# Patient Record
Sex: Male | Born: 1935 | Race: Black or African American | Hispanic: No | Marital: Married | Smoking: Never smoker
Health system: Southern US, Community
[De-identification: ages and names within clinical notes are randomized; demographics above are authoritative.]

## PROBLEM LIST (undated history)

## (undated) DIAGNOSIS — I272 Pulmonary hypertension, unspecified: Secondary | ICD-10-CM

## (undated) DIAGNOSIS — R972 Elevated prostate specific antigen [PSA]: Secondary | ICD-10-CM

## (undated) DIAGNOSIS — R739 Hyperglycemia, unspecified: Secondary | ICD-10-CM

## (undated) DIAGNOSIS — Z952 Presence of prosthetic heart valve: Secondary | ICD-10-CM

## (undated) DIAGNOSIS — E611 Iron deficiency: Secondary | ICD-10-CM

## (undated) DIAGNOSIS — D696 Thrombocytopenia, unspecified: Secondary | ICD-10-CM

## (undated) DIAGNOSIS — D649 Anemia, unspecified: Secondary | ICD-10-CM

## (undated) DIAGNOSIS — I739 Peripheral vascular disease, unspecified: Secondary | ICD-10-CM

## (undated) DIAGNOSIS — N529 Male erectile dysfunction, unspecified: Secondary | ICD-10-CM

## (undated) DIAGNOSIS — I10 Essential (primary) hypertension: Secondary | ICD-10-CM

## (undated) DIAGNOSIS — I350 Nonrheumatic aortic (valve) stenosis: Secondary | ICD-10-CM

## (undated) DIAGNOSIS — I071 Rheumatic tricuspid insufficiency: Secondary | ICD-10-CM

## (undated) DIAGNOSIS — E7800 Pure hypercholesterolemia, unspecified: Secondary | ICD-10-CM

## (undated) DIAGNOSIS — K3184 Gastroparesis: Secondary | ICD-10-CM

## (undated) DIAGNOSIS — G40909 Epilepsy, unspecified, not intractable, without status epilepticus: Secondary | ICD-10-CM

## (undated) DIAGNOSIS — E039 Hypothyroidism, unspecified: Secondary | ICD-10-CM

## (undated) DIAGNOSIS — I77810 Thoracic aortic ectasia: Secondary | ICD-10-CM

## (undated) DIAGNOSIS — M129 Arthropathy, unspecified: Secondary | ICD-10-CM

## (undated) DIAGNOSIS — E041 Nontoxic single thyroid nodule: Secondary | ICD-10-CM

## (undated) DIAGNOSIS — R351 Nocturia: Secondary | ICD-10-CM

## (undated) DIAGNOSIS — I779 Disorder of arteries and arterioles, unspecified: Secondary | ICD-10-CM

## (undated) DIAGNOSIS — E559 Vitamin D deficiency, unspecified: Secondary | ICD-10-CM

## (undated) DIAGNOSIS — K635 Polyp of colon: Secondary | ICD-10-CM

## (undated) DIAGNOSIS — N401 Enlarged prostate with lower urinary tract symptoms: Secondary | ICD-10-CM

## (undated) HISTORY — DX: Thoracic aortic ectasia: I77.810

## (undated) HISTORY — DX: Iron deficiency: E61.1

## (undated) HISTORY — DX: Gastroparesis: K31.84

## (undated) HISTORY — DX: Thrombocytopenia, unspecified: D69.6

## (undated) HISTORY — DX: Benign prostatic hyperplasia with lower urinary tract symptoms: N40.1

## (undated) HISTORY — DX: Epilepsy, unspecified, not intractable, without status epilepticus: G40.909

## (undated) HISTORY — DX: Nonrheumatic aortic (valve) stenosis: I35.0

## (undated) HISTORY — DX: Polyp of colon: K63.5

## (undated) HISTORY — DX: Hyperglycemia, unspecified: R73.9

## (undated) HISTORY — DX: Nocturia: R35.1

## (undated) HISTORY — DX: Elevated prostate specific antigen (PSA): R97.20

## (undated) HISTORY — DX: Hypothyroidism, unspecified: E03.9

## (undated) HISTORY — DX: Pulmonary hypertension, unspecified: I27.20

## (undated) HISTORY — DX: Peripheral vascular disease, unspecified: I73.9

## (undated) HISTORY — DX: Essential (primary) hypertension: I10

## (undated) HISTORY — DX: Arthropathy, unspecified: M12.9

## (undated) HISTORY — DX: Vitamin D deficiency, unspecified: E55.9

## (undated) HISTORY — PX: NO PAST SURGERIES: SHX2092

## (undated) HISTORY — DX: Disorder of arteries and arterioles, unspecified: I77.9

## (undated) HISTORY — DX: Pure hypercholesterolemia, unspecified: E78.00

## (undated) HISTORY — DX: Rheumatic tricuspid insufficiency: I07.1

## (undated) HISTORY — DX: Anemia, unspecified: D64.9

## (undated) HISTORY — DX: Male erectile dysfunction, unspecified: N52.9

## (undated) HISTORY — DX: Nontoxic single thyroid nodule: E04.1

---

## 2014-02-02 ENCOUNTER — Encounter (HOSPITAL_BASED_OUTPATIENT_CLINIC_OR_DEPARTMENT_OTHER): Payer: Self-pay

## 2014-02-02 ENCOUNTER — Emergency Department (HOSPITAL_BASED_OUTPATIENT_CLINIC_OR_DEPARTMENT_OTHER)
Admission: EM | Admit: 2014-02-02 | Discharge: 2014-02-02 | Disposition: A | Discharge status: Home / Self Care | Payer: Medicare HMO | Attending: Emergency Medicine | Admitting: Emergency Medicine

## 2014-02-02 ENCOUNTER — Emergency Department (HOSPITAL_BASED_OUTPATIENT_CLINIC_OR_DEPARTMENT_OTHER): Payer: Medicare HMO

## 2014-02-02 DIAGNOSIS — E039 Hypothyroidism, unspecified: Secondary | ICD-10-CM

## 2014-02-02 DIAGNOSIS — R52 Pain, unspecified: Secondary | ICD-10-CM

## 2014-02-02 DIAGNOSIS — Y9389 Activity, other specified: Secondary | ICD-10-CM | POA: Insufficient documentation

## 2014-02-02 DIAGNOSIS — Y990 Civilian activity done for income or pay: Secondary | ICD-10-CM | POA: Insufficient documentation

## 2014-02-02 DIAGNOSIS — W230XXA Caught, crushed, jammed, or pinched between moving objects, initial encounter: Secondary | ICD-10-CM | POA: Insufficient documentation

## 2014-02-02 DIAGNOSIS — S91111A Laceration without foreign body of right great toe without damage to nail, initial encounter: Secondary | ICD-10-CM | POA: Diagnosis not present

## 2014-02-02 DIAGNOSIS — I350 Nonrheumatic aortic (valve) stenosis: Secondary | ICD-10-CM

## 2014-02-02 DIAGNOSIS — Y9289 Other specified places as the place of occurrence of the external cause: Secondary | ICD-10-CM | POA: Insufficient documentation

## 2014-02-02 DIAGNOSIS — S92421A Displaced fracture of distal phalanx of right great toe, initial encounter for closed fracture: Secondary | ICD-10-CM | POA: Insufficient documentation

## 2014-02-02 DIAGNOSIS — I071 Rheumatic tricuspid insufficiency: Secondary | ICD-10-CM

## 2014-02-02 DIAGNOSIS — I10 Essential (primary) hypertension: Secondary | ICD-10-CM

## 2014-02-02 DIAGNOSIS — S97111A Crushing injury of right great toe, initial encounter: Secondary | ICD-10-CM | POA: Diagnosis present

## 2014-02-02 DIAGNOSIS — I272 Pulmonary hypertension, unspecified: Secondary | ICD-10-CM

## 2014-02-02 DIAGNOSIS — S92401A Displaced unspecified fracture of right great toe, initial encounter for closed fracture: Secondary | ICD-10-CM

## 2014-02-02 DIAGNOSIS — E785 Hyperlipidemia, unspecified: Secondary | ICD-10-CM

## 2014-02-02 MED ORDER — CEPHALEXIN 250 MG PO CAPS
500.0000 mg | ORAL_CAPSULE | Freq: Once | ORAL | Status: AC
  Administered 2014-02-02: 500 mg via ORAL
  Filled 2014-02-02: qty 2

## 2014-02-02 MED ORDER — HYDROCODONE-ACETAMINOPHEN 5-325 MG PO TABS: 1.0000 | ORAL_TABLET | ORAL | Status: DC | PRN

## 2014-02-02 MED ORDER — CEPHALEXIN 500 MG PO CAPS: 500.0000 mg | ORAL_CAPSULE | Freq: Four times a day (QID) | ORAL | Status: DC

## 2014-02-02 MED ORDER — LIDOCAINE HCL 2 % IJ SOLN
INTRAMUSCULAR | Status: AC
  Administered 2014-02-02: 400 mg via INTRADERMAL
  Filled 2014-02-02: qty 20

## 2014-02-02 MED ORDER — LIDOCAINE HCL 2 % IJ SOLN
20.0000 mL | Freq: Once | INTRAMUSCULAR | Status: AC
  Administered 2014-02-02: 400 mg via INTRADERMAL

## 2014-02-02 NOTE — ED Notes (Signed)
Pts right great toe was crushed by machine. Pt has bleeding at this time. Toe nail not intact

## 2014-02-03 NOTE — ED Provider Notes (Signed)
CSN: [id number]     Arrival date & time 02/02/14  1651 History   First MD Initiated Contact with Patient 02/02/14 1706     Chief Complaint  Patient presents with  . Toe Injury      HPI Patient reports injury to his right great toe.  He was working with Programmer, multimediapiece machinery and a piece of machinery came down onto his right great toe nearly avulsing his right great toenail and leading to large complex laceration to his distal right great toe.  Some bleeding at this time.  Pain is mild to moderate in severity.Of anticoagulants the patient.  No other significant complaints.  Injury was focused to his right great toe.   History reviewed. No pertinent past medical history. History reviewed. No pertinent past surgical history. No family history on file. History  Substance Use Topics  . Smoking status: Never Smoker   . Smokeless tobacco: Not on file  . Alcohol Use: Not on file    Review of Systems  All other systems reviewed and are negative.     Allergies  Review of patient's allergies indicates no known allergies.  Home Medications   Prior to Admission medications   Medication Sig Start Date End Date Taking? Authorizing Provider  cephALEXin (KEFLEX) 500 MG capsule Take 1 capsule (500 mg total) by mouth 4 (four) times daily. 02/02/14   Lyanne CoKevin M Joh Rao, MD  HYDROcodone-acetaminophen (NORCO/VICODIN) 5-325 MG per tablet Take 1 tablet by mouth every 4 (four) hours as needed for moderate pain. 02/02/14   Lyanne CoKevin M Delcenia Inman, MD   BP 140/88 mmHg  Pulse 70  Temp(Src) 98.2 F (36.8 C) (Oral)  Resp 16  Ht 5\' 9"  (1.753 m)  Wt 175 lb (79.379 kg)  BMI 25.83 kg/m2  SpO2 99% Physical Exam  Constitutional: He is oriented to person, place, and time. He appears well-developed and well-nourished.  HENT:  Head: Normocephalic.  Eyes: EOM are normal.  Neck: Normal range of motion.  Pulmonary/Chest: Effort normal.  Abdominal: He exhibits no distension.  Musculoskeletal: Normal range of motion.  Near  complete avulsion and loss of the right great toenail which is only held on by a small amount of skin at the distal aspect.  Small amount of bleeding coming from the nail bed itself.  Approximately 1/5 of the nailbed on the medial aspect has been avulsed and there is some tissue loss at this area.  I can palpate the distal aspect of the phalanx itself.  I'm unable to visualize it.  Bleeding is controlled with pressure over the nail bed.  Small lacerations on both the medial and lateral aspect of the right great toe where the toe nail and very distal end of the toe was nearly avulsed  Neurological: He is alert and oriented to person, place, and time.  Psychiatric: He has a normal mood and affect.  Nursing note and vitals reviewed.   ED Course  Procedures (including critical care time) NERVE BLOCK Performed by: Lyanne CoAMPOS,Lianni Kanaan M Consent: Verbal consent obtained. Required items: required blood products, implants, devices, and special equipment available Time out: Immediately prior to procedure a "time out" was called to verify the correct patient, procedure, equipment, support staff and site/side marked as required. Indication: Laceration repair  Nerve block body site: Digital nerves, right great toe  Preparation: Patient was prepped and draped in the usual sterile fashion. Needle gauge: 24 G Location technique: anatomical landmarks Local anesthetic: Lidocaine 1% without epi  Anesthetic total: 6 ml Outcome: pain improved  Patient tolerance: Patient tolerated the procedure well with no immediate complications.   LACERATION REPAIR Performed by: Lyanne CoAMPOS,Fredrick Dray M Consent: Verbal consent obtained. Risks and benefits: risks, benefits and alternatives were discussed Patient identity confirmed: provided demographic data Time out performed prior to procedure Prepped and Draped in normal sterile fashion Wound explored Laceration Location: right great toe Laceration Length: 2cm No Foreign Bodies seen  or palpated Anesthesia:nerve block Irrigation method: syringe Amount of cleaning: copious Skin closure: 4-0 prolene Number of sutures or staples: 2 sutures on both the medial and lateral side  Technique: simple interrupted Patient tolerance: Patient tolerated the procedure well with no immediate complications.     Labs Review Labs Reviewed - No data to display  Imaging Review Dg Toe Great Right  02/02/2014   CLINICAL DATA:  smashed in a machine today. C/o distal pain, toenail is removed.Foot wrapped in gauze to control bleeding. Patient states toe is numb  EXAM: RIGHT GREAT TOE  COMPARISON:  None.  FINDINGS: Comminuted fracture of the distal phalanx right great toe predominantly involving the tuft region. Fracture line in the sagittal plane extends to the subchondral cortex without significant step-off deformity. There is surrounding soft tissue swelling. No radiodense foreign body. No other bone abnormality is identified. Normal mineralization and alignment.  IMPRESSION: 1. Comminuted fracture, distal phalanx right great toe with intra-articular extension.   Electronically Signed   By: Oley Balmaniel  Hassell M.D.   On: 02/02/2014 17:38  I personally reviewed the imaging tests through PACS system I reviewed available ER/hospitalization records through the EMR    EKG Interpretation None      MDM   Final diagnoses:  Fractured great toe, right, closed, initial encounter  Laceration of great toe, right, complicated, initial encounter    Nail of the right great toe was nearly completely avulsed.  This was removed in its entirety as it was a very abnormally shaped toenail to begin with.  The toenail could not be inserted back under the nail fold safely.  Small lacerations repaired on the side of the great toe however there is some tissue loss and a small amount of exposed bone.  The majority of this will have to heal by secondary intention and granulation.  The patient is at some risk for  infection in that distal phalanx.  This was washed out extensively at the bedside.  He was placed on antibiotics.  Orthopedic follow-up.  Small amount of bleeding continued from the nail bed which was easily controlled with direct pressure.  Pressure dressing applied.  Postop shoe.    Lyanne CoKevin M Maysen Sudol, MD 02/03/14 Moses Manners0025

## 2016-09-24 ENCOUNTER — Encounter: Payer: Medicare HMO | Admitting: Thoracic Surgery (Cardiothoracic Vascular Surgery)

## 2016-09-27 ENCOUNTER — Encounter: Payer: Medicare HMO | Admitting: Thoracic Surgery (Cardiothoracic Vascular Surgery)

## 2016-09-28 ENCOUNTER — Encounter: Payer: Medicare HMO | Admitting: Thoracic Surgery (Cardiothoracic Vascular Surgery)

## 2016-09-28 ENCOUNTER — Other Ambulatory Visit: Payer: Self-pay

## 2016-09-28 DIAGNOSIS — I350 Nonrheumatic aortic (valve) stenosis: Secondary | ICD-10-CM | POA: Insufficient documentation

## 2016-10-08 ENCOUNTER — Encounter: Payer: Self-pay | Admitting: Thoracic Surgery (Cardiothoracic Vascular Surgery)

## 2016-10-08 ENCOUNTER — Institutional Professional Consult (permissible substitution) (INDEPENDENT_AMBULATORY_CARE_PROVIDER_SITE_OTHER): Payer: Medicare HMO | Admitting: Thoracic Surgery (Cardiothoracic Vascular Surgery)

## 2016-10-08 ENCOUNTER — Encounter (HOSPITAL_BASED_OUTPATIENT_CLINIC_OR_DEPARTMENT_OTHER): Payer: Self-pay | Admitting: Physician Assistant

## 2016-10-08 VITALS — BP 140/84 | HR 72 | Resp 20 | Ht <= 58 in | Wt 178.0 lb

## 2016-10-08 DIAGNOSIS — I739 Peripheral vascular disease, unspecified: Secondary | ICD-10-CM | POA: Diagnosis not present

## 2016-10-08 DIAGNOSIS — I10 Essential (primary) hypertension: Secondary | ICD-10-CM

## 2016-10-08 DIAGNOSIS — I272 Pulmonary hypertension, unspecified: Secondary | ICD-10-CM

## 2016-10-08 DIAGNOSIS — I071 Rheumatic tricuspid insufficiency: Secondary | ICD-10-CM

## 2016-10-08 DIAGNOSIS — I350 Nonrheumatic aortic (valve) stenosis: Secondary | ICD-10-CM | POA: Diagnosis not present

## 2016-10-08 DIAGNOSIS — I779 Disorder of arteries and arterioles, unspecified: Secondary | ICD-10-CM | POA: Diagnosis not present

## 2016-10-08 DIAGNOSIS — E785 Hyperlipidemia, unspecified: Secondary | ICD-10-CM

## 2016-10-08 DIAGNOSIS — E039 Hypothyroidism, unspecified: Secondary | ICD-10-CM

## 2016-10-08 NOTE — Patient Instructions (Signed)
Continue all previous medications without any changes at this time  

## 2016-10-08 NOTE — Progress Notes (Signed)
[facility]  MULTIDISCIPLINARY HEART VALVE CLINIC  CARDIOTHORACIC SURGERY CONSULTATION REPORT  Referring Provider is [name], Suszanne ConnersMichael R, PA-C  PCP is [name], [name], PA-C  Chief Complaint  Patient presents with  . Aortic Stenosis    Surgical eval, ECHO 09/03/16 @ [facility]     HPI:  Patient is an 83 year old African-American male with history of aortic stenosis, hypertension, hyperlipidemia, peripheral vascular disease, and hypothyroidism with been referred for surgical consultation to discuss management options for treatment of severe aortic stenosis. The patient states that he was first noted have a heart murmur on physical exam many years ago he has been followed regularly ever since at [facility] in [place], for the last several years by [name].  He has remained physically active despite his advanced age, but serial echocardiograms have documented significant progression of disease. Most recent follow-up echocardiogram performed 09/03/2016 revealed velocity across the aortic valve measured as high as 4.5 m/s corresponding to mean transvalvular gradient estimated 54.9 mmHg and aortic valve area calculated 0.6 cm. Left ventricular systolic function remained normal with ejection fraction estimated 60-65%. The patient was referred for surgical consultation.  The patient is married and lives locally in [place] with his wife.  He is a bit of a difficult historian and wonders off topic frequently. However, he seems to have no significant memory problems. He is here alone for this consultation today. 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 He does admit that over the last 2 or 3 years he has noticed a tendency to get fatigued more easily than he had in the past. However, he specifically denies any symptoms of exertional shortness of breath or chest discomfort. He has not had any tachycardia  palpitations or dizzy spells. He denies any history of PND, orthopnea, or lower extremity edema. Appetite is good and his weight is stable.  Past Medical History:  Diagnosis Date  . Anemia   . Aortic valve stenosis   . Arthropathy   . BPH associated with nocturia   . Carotid artery disease (HCC)   . Colon polyp   . Dilated aortic root (HCC)   . Elevated PSA   . Erectile dysfunction   . Gastric paresis   . Hypercholesterolemia   . Hyperglycemia   . Hypertension   . Hypothyroid   . Iron deficiency   . PAD (peripheral artery disease) (HCC)   . Pulmonary hypertension (HCC)   . Seizure disorder (HCC)   . Thoracic aortic aneurysm (HCC)   . Thrombocytopenia (HCC)   . Thyroid nodule   . Tricuspid regurgitation   . Vitamin D deficiency     Past Surgical History:  Procedure Laterality Date  . NO PAST SURGERIES      No family history on file.  Social History   Social History  . Marital status: Married    Spouse name: N/A  . Number of children: N/A  . Years of education: N/A   Occupational History  . Not on file.   Social History Main Topics  . Smoking status: Never Smoker  . Smokeless tobacco: Never Used  . Alcohol use No  . Drug use: No  . Sexual activity: Not Currently   Other Topics Concern  . Not on file   Social History Narrative  . No narrative on file    Current Outpatient Prescriptions  Medication Sig Dispense Refill  . aspirin EC 81 MG  tablet 81 mg.    . levothyroxine (SYNTHROID, LEVOTHROID) 50 MCG tablet     . simvastatin (ZOCOR) 40 MG tablet 40 mg.    . tamsulosin (FLOMAX) 0.4 MG CAPS capsule Take 0.4 mg by mouth daily.     No current facility-administered medications for this visit.     No Known Allergies    Review of Systems:   General:  normal appetite, decreased energy, no weight gain, no weight loss, no fever  Cardiac:  no chest pain with exertion, no chest pain at rest, no SOB with exertion, no resting SOB, no PND, no orthopnea, no  palpitations, no arrhythmia, no atrial fibrillation, no LE edema, no dizzy spells, no syncope  Respiratory:  no shortness of breath, no home oxygen, no productive cough, no dry cough, no bronchitis, no wheezing, no hemoptysis, no asthma, no pain with inspiration or cough, no sleep apnea, no CPAP at night  GI:   no difficulty swallowing, no reflux, no frequent heartburn, no hiatal hernia, no abdominal pain, no constipation, no diarrhea, no hematochezia, no hematemesis, no melena  GU:   no dysuria,  no frequency, no urinary tract infection, no hematuria, no enlarged prostate, no kidney stones, no kidney disease  Vascular:  no pain suggestive of claudication, no pain in feet, no leg cramps, no varicose veins, no DVT, no non-healing foot ulcer  Neuro:   no stroke, no TIA's, no seizures, no headaches, no temporary blindness one eye,  no slurred speech, no peripheral neuropathy, no chronic pain, no instability of gait, no memory/cognitive dysfunction  Musculoskeletal: no arthritis, no joint swelling, no myalgias, no difficulty walking, normal mobility   Skin:   no rash, no itching, no skin infections, no pressure sores or ulcerations  Psych:   no anxiety, no depression, no nervousness, no unusual recent stress  Eyes:   no blurry vision, no floaters, no recent vision changes, + wears glasses or contacts  ENT:   no hearing loss, + loose or painful teeth, + partial dentures, last saw dentist many years ago  Hematologic:  no easy bruising, no abnormal bleeding, no clotting disorder, no frequent epistaxis  Endocrine:  no diabetes, does not check CBG's at home           Physical Exam:   BP 140/84   Pulse 72   Resp 20   Ht (!) 6.4" (0.163 m)   Wt 178 lb (80.7 kg)   SpO2 92% Comment: RA  BMI 3055.36 kg/m   General:  Elderly male NAD, well-appearing  HEENT:  Unremarkable but poor dentition  Neck:   no JVD, no bruits, no adenopathy   Chest:   clear to auscultation, symmetrical breath sounds, no  wheezes, no rhonchi   CV:   RRR, grade IV/VI crescendo/decrescendo murmur heard best at RUSB,  no diastolic murmur  Abdomen:  soft, non-tender, no masses   Extremities:  warm, well-perfused, pulses palpable, no LE edema  Rectal/GU  Deferred  Neuro:   Grossly non-focal and symmetrical throughout  Skin:   Clean and dry, no rashes, no breakdown   Diagnostic Tests:  TRANSTHORACIC ECHOCARDIOGRAM  Both images and report from transthoracic echocardiogram performed 09/03/2016 at [facility] are reviewed. The patient has severe aortic stenosis with normal left ventricular systolic function. Aortic valve may be functionally bicuspid with a single raphe.  There is severe thickening, calcification, and restricted leaflet mobility involving all 3 leaflets although one of the leaflets move better than the other 2. Peak velocity across  the aortic valve measured greater than 4.5 m/s corresponding to mean transvalvular gradient estimated 55 mmHg. Left ventricular systolic function appears normal. There is mild concentric left ventricular hypertrophy. The aortic root and overall size of the aortic valve is relatively large. The ascending thoracic aorta measured at least 4.6 cm. No other significant abnormalities are noted.   Impression:  The patient has at least stage C1 and possibly early stage D severe aortic stenosis. He also has at least moderate fusiform aneurysmal enlargement of the ascending thoracic aorta, and his aortic valve may be functioning bicuspid. The patient claims to be essentially asymptomatic and he reportedly is fairly active physically. He does admit to some degree of exertional fatigue which may be consistent with early chronic diastolic congestive heart failure.  He appears to have poor dentition and has not seen a dentist in years.   Plan:  The patient was counseled at length regarding treatment alternatives for management of severe aortic stenosis. The natural history of  disease in the importance of a the presence or absence of symptoms have been discussed. Alternative approaches such as conventional aortic valve replacement, transcatheter aortic valve replacement, and palliative medical therapy were compared and contrasted at length.  We discussed a variety of options at this juncture including continued medical therapy with very close follow-up versus exercise stress testing as a means to stratify whether not the patient is truly asymptomatic and/or at risk for significant disease progression in the near future. We plan to proceed with formal cardiopulmonary exercise testing was stress echocardiography as a next step. If the patient does well then we can potentially follow him closely on medical therapy with confidence. The patient will undergo CT angiography to more precisely measure the size of the aortic root and ascending thoracic aorta. Finally, the patient will be referred for dental service consultation. The patient will return to our office in approximately 4 weeks to review the results of his stress test and CT angiograms.  I have asked that he bring his wife with him for his next office visit.  All of his questions have been addressed.   I spent in excess of 90 minutes during the conduct of this office consultation and >50% of this time involved direct face-to-face encounter with the patient for counseling and/or coordination of their care.     Salvatore Decent. Cornelius Moras, MD 10/08/2016 5:51 PM

## 2016-10-09 ENCOUNTER — Other Ambulatory Visit: Payer: Self-pay | Admitting: *Deleted

## 2016-10-09 DIAGNOSIS — I350 Nonrheumatic aortic (valve) stenosis: Secondary | ICD-10-CM

## 2016-10-10 ENCOUNTER — Other Ambulatory Visit: Payer: Self-pay | Admitting: *Deleted

## 2016-10-10 DIAGNOSIS — I350 Nonrheumatic aortic (valve) stenosis: Secondary | ICD-10-CM

## 2016-10-12 ENCOUNTER — Encounter (HOSPITAL_COMMUNITY): Payer: Self-pay | Admitting: Dentistry

## 2016-10-12 ENCOUNTER — Ambulatory Visit (HOSPITAL_COMMUNITY): Payer: Self-pay | Admitting: Dentistry

## 2016-10-12 VITALS — BP 123/75 | HR 60 | Temp 97.7°F

## 2016-10-12 DIAGNOSIS — K0889 Other specified disorders of teeth and supporting structures: Secondary | ICD-10-CM

## 2016-10-12 DIAGNOSIS — K08409 Partial loss of teeth, unspecified cause, unspecified class: Secondary | ICD-10-CM

## 2016-10-12 DIAGNOSIS — Z972 Presence of dental prosthetic device (complete) (partial): Secondary | ICD-10-CM

## 2016-10-12 DIAGNOSIS — I350 Nonrheumatic aortic (valve) stenosis: Secondary | ICD-10-CM

## 2016-10-12 DIAGNOSIS — Z01818 Encounter for other preprocedural examination: Secondary | ICD-10-CM

## 2016-10-12 DIAGNOSIS — K045 Chronic apical periodontitis: Secondary | ICD-10-CM | POA: Diagnosis not present

## 2016-10-12 DIAGNOSIS — K06020 Generalized gingival recession, unspecified: Secondary | ICD-10-CM

## 2016-10-12 DIAGNOSIS — K06010 Localized gingival recession, unspecified: Secondary | ICD-10-CM

## 2016-10-12 DIAGNOSIS — K0530 Chronic periodontitis, unspecified: Secondary | ICD-10-CM | POA: Diagnosis not present

## 2016-10-12 DIAGNOSIS — K036 Deposits [accretions] on teeth: Secondary | ICD-10-CM

## 2016-10-12 DIAGNOSIS — M264 Malocclusion, unspecified: Secondary | ICD-10-CM

## 2016-10-12 DIAGNOSIS — K083 Retained dental root: Secondary | ICD-10-CM | POA: Insufficient documentation

## 2016-10-12 DIAGNOSIS — K029 Dental caries, unspecified: Secondary | ICD-10-CM

## 2016-10-12 NOTE — Patient Instructions (Signed)
[facility]    Department of Dental Medicine     DR. Yen Wandell      HEART VALVES AND MOUTH CARE:  FACTS:   If you have any infection in your mouth, it can infect your heart valve.  If you heart valve is infected, you will be seriously ill.  Infections in the mouth can be SILENT and do not always cause pain.  Examples of infections in the mouth are gum disease, dental cavities, and abscesses.  Some possible signs of infection are: Bad breath, bleeding gums, or teeth that are sensitive to sweets, hot, and/or cold. There are many other signs as well.  WHAT YOU HAVE TO DO:   Brush your teeth after meals and at bedtime. Spend at least 2 minutes brushing well, especially behind your back teeth and all around your teeth that stand alone. Brush at the gumline also.  Do not go to bed without brushing your teeth and flossing.  If you gums bleed when you brush or floss, do NOT stop brushing or flossing. It usually means that your gums need more attention and better cleaning.   If your Dentist or Dr. Tatiyanna Lashley gave you a prescription mouthwash to use, make sure to use it as directed. If you run out of the medication, get a refill at the pharmacy.   If you were given any other medications or directions by your Dentist, please follow them. If you did not understand the directions or forget what you were told, please call. We will be happy to refresh her memory.  If you need antibiotics before dental procedures, make sure you take them one hour prior to every dental visit as directed.   Get a dental checkup every 4-6 months in order to keep your mouth healthy, or to find and treat any new infection. You will most likely need your teeth cleaned or gums treated at the same time.  If you are not able to come in for your scheduled appointment, call your Dentist as soon as possible to reschedule.  If you have a problem in between dental visits, call your Dentist.  

## 2016-10-12 NOTE — Progress Notes (Signed)
DENTAL CONSULTATION  Date of Consultation:  10/12/2016 Patient Name:   Charles Patrick Date of Birth:   08-08-1924 Medical Record Number: [id number]  VITALS: BP 123/75 (BP Location: Left Arm)   Pulse 60   Temp 97.7 F (36.5 C) (Oral)    CHIEF COMPLAINT: Patient referred by Dr. Cornelius Moras for dental consultation.  HPI: Charles Patrick is an 83 year old male recently diagnosed with severe aortic stenosis. Patient with anticipated aortic valve replacement. Patient now seen as part of a medically necessary pre-heart valve surgery dental protocol examination to rule out dental infection that may affect the patient's systemic health and anticipated heart valve surgery.  The patient currently denies acute toothaches, swellings, or abscesses. Patient was last seen by dentist 10-15 years ago in [place], [place]. Patient had several teeth pulled at that time with subsequent fabrication of a maxillary partial denture.  Patient denies having a lower partial denture. Patient indicates that he only goes to the dentist when he needs to. Patient denies having dental phobia.   PROBLEM LIST: Patient Active Problem List   Diagnosis Date Noted  . Aortic valve stenosis     Priority: High  . HTN (hypertension) 10/08/2016  . HLD (hyperlipidemia) 10/08/2016  . Hypothyroidism 10/08/2016  . Pulmonary hypertension (HCC) 10/08/2016  . Tricuspid regurgitation 10/08/2016  . PAD (peripheral artery disease) (HCC)   . Carotid artery disease (HCC)     PMH: Past Medical History:  Diagnosis Date  . Anemia   . Aortic valve stenosis   . Arthropathy   . BPH associated with nocturia   . Carotid artery disease (HCC)   . Colon polyp   . Dilated aortic root (HCC)   . Elevated PSA   . Erectile dysfunction   . Gastric paresis   . Hypercholesterolemia   . Hyperglycemia   . Hypertension   . Hypothyroid   . Iron deficiency   . PAD (peripheral artery disease) (HCC)   . Pulmonary hypertension (HCC)   . Seizure  disorder (HCC)   . Thoracic aortic aneurysm (HCC)   . Thrombocytopenia (HCC)   . Thyroid nodule   . Tricuspid regurgitation   . Vitamin D deficiency     PSH: Past Surgical History:  Procedure Laterality Date  . NO PAST SURGERIES      ALLERGIES: No Known Allergies  MEDICATIONS: Current Outpatient Prescriptions  Medication Sig Dispense Refill  . aspirin EC 81 MG tablet 81 mg.    . levothyroxine (SYNTHROID, LEVOTHROID) 50 MCG tablet     . simvastatin (ZOCOR) 40 MG tablet 40 mg.    . tamsulosin (FLOMAX) 0.4 MG CAPS capsule Take 0.4 mg by mouth daily.     No current facility-administered medications for this visit.     LABS: No results found for: WBC, HGB, HCT, MCV, PLT No results found for: NA, K, CL, CO2, GLUCOSE, BUN, CREATININE, CALCIUM, GFRNONAA, GFRAA No results found for: INR, PROTIME No results found for: PTT   CBC from [facility] on 07/26/16 WBC: 6.8 Hgb: 11.3 HCT: 38.1 Platelets: 226  SOCIAL HISTORY: Social History   Social History  . Marital status: Married    Spouse name: N/A  . Number of children: N/A  . Years of education: N/A   Occupational History  . Not on file.   Social History Main Topics  . Smoking status: Never Smoker  . Smokeless tobacco: Never Used  . Alcohol use No  . Drug use: No  . Sexual activity: Not Currently   Other  Topics Concern  . Not on file   Social History Narrative  . No narrative on file    FAMILY HISTORY: History reviewed. No pertinent family history.  REVIEW OF SYSTEMS: Reviewed with the patient as per History of present illness. Psych: Patient denies having dental phobia.  DENTAL HISTORY: CHIEF COMPLAINT: Patient referred by Dr. Cornelius Moras for dental consultation.  HPI: Charles Patrick is an 83 year old male recently diagnosed with severe aortic stenosis. Patient with anticipated aortic valve replacement. Patient now seen as part of a medically necessary pre-heart valve surgery dental protocol examination to rule  out dental infection that may affect the patient's systemic health and anticipated heart valve surgery.  The patient currently denies acute toothaches, swellings, or abscesses. Patient was last seen by dentist 10-15 years ago and [place], [place]. Patient had several teeth pulled at that time with subsequent fabrication of a maxillary partial denture.  Patient denies having a lower partial denture. Patient indicates that he only goes to the dentist when he needs to. Patient denies having dental phobia.    DENTAL EXAMINATION: GENERAL: The patient is a tall, well-developed male in no acute distress. HEAD AND NECK: There is no palpable neck lymphadenopathy. The patient denies acute TMJ symptoms. INTRAORAL EXAM: The patient has normal saliva. There is no evidence of oral abscess formation. Maximum interincisal opening is measured at 45 mm. There is flabby tissues associated with the premaxilla. DENTITION: Patient is missing tooth numbers 1 through 3, 5 through 16, 17-20, 23-26, and 30-32. There is retained root segment in the area #4. PERIODONTAL: The patient has chronic periodontitis with plaque and calculus accumulations, generalized gingival recession, generalized tooth mobility as per dental charting form. There is moderate to severe bone loss noted. DENTAL CARIES/SUBOPTIMAL RESTORATIONS: Multiple dental caries are noted as per dental charting form. Multiple flexure lesions are noted. ENDODONTIC: Patient denies acute pulpitis symptoms. Patient does have periapical pathology associated with tooth #4 and 21. CROWN AND BRIDGE: There are no crown or bridge restorations. PROSTHODONTIC: The patient has a maxillary acrylic partial denture that is ill fitting and is broken in half. Patient is still wearing the dental prosthesis, however. OCCLUSION: Patient has a poor occlusal scheme secondary to multiple missing teeth, supra-eruption and drifting of the unopposed teeth into the edentulous areas, and  lack of replacement of missing teeth with clinically acceptable dental prostheses.  RADIOGRAPHIC INTERPRETATION: An orthopantogram was taken along with 5 periapical radiographs. There are multiple missing teeth. There is a retained root segment in the area of #4. There is moderate to severe bone loss. There is periapical pathology and radiolucency associated with tooth numbers 4 and 21. Dental caries are noted. Radiographic calculus is noted.   ASSESSMENTS: 1. Severe aortic stenosis 2. Pre-heart valve surgery dental protocol 3. Chronic apical periodontitis 4. Dental caries 6. Retained root segment 7. Chronic periodontitis with bone loss 8. Gingival recession 9. Accretions 10. Loose teeth 11. Multiple missing teeth 12. Ill fitting maxillary acrylic partial denture that is broken. 13. Flabby tissues associated with the premaxilla 14. Poor occlusal scheme and malocclusion 15. Risk for complications with anticipated invasive dental procedures in the operating room with general anesthesia to and including death due to his overall cardiovascular compromise.   PLAN/RECOMMENDATIONS: 1. I discussed the risks, benefits, and complications of various treatment options with the patient in relationship to his medical and dental conditions, anticipated heart valve surgery, risk for endocarditis.  We discussed various treatment options to include no treatment, multiple extractions with alveoloplasty,  pre-prosthetic surgery as indicated, periodontal therapy, dental restorations, root canal therapy, crown and bridge therapy, implant therapy, and replacement of missing teeth as indicated. The patient currently wishes to proceed with extraction of remaining teeth with alveoloplasty in the operating room with general anesthesia. Patient will then follow-up with the general dentist of his choice for fabrication of upper lower complete dentures after adequate healing and once medically stable from the anticipated  heart valve surgery. The dental operating room procedure has been scheduled for Wednesday, 10/17/2016 at 7:30 AM at [facility].  2. Discussion of findings with medical team and coordination of future medical and dental care as needed.  I spent in excess of  120 minutes during the conduct of this consultation and >50% of this time involved direct face-to-face encounter for counseling and/or coordination of the patient's care.    Charlynne Pander, DDS

## 2016-10-15 ENCOUNTER — Other Ambulatory Visit: Payer: Self-pay | Admitting: *Deleted

## 2016-10-16 ENCOUNTER — Encounter (HOSPITAL_COMMUNITY)
Admission: RE | Admit: 2016-10-16 | Discharge: 2016-10-16 | Disposition: A | Discharge status: Home / Self Care | Payer: Medicare HMO | Source: Ambulatory Visit | Attending: Dentistry | Admitting: Dentistry

## 2016-10-16 ENCOUNTER — Encounter (HOSPITAL_COMMUNITY): Payer: Self-pay

## 2016-10-16 DIAGNOSIS — E039 Hypothyroidism, unspecified: Secondary | ICD-10-CM | POA: Diagnosis not present

## 2016-10-16 DIAGNOSIS — E7800 Pure hypercholesterolemia, unspecified: Secondary | ICD-10-CM | POA: Diagnosis not present

## 2016-10-16 DIAGNOSIS — I350 Nonrheumatic aortic (valve) stenosis: Secondary | ICD-10-CM | POA: Diagnosis present

## 2016-10-16 DIAGNOSIS — K0889 Other specified disorders of teeth and supporting structures: Secondary | ICD-10-CM | POA: Diagnosis not present

## 2016-10-16 DIAGNOSIS — N401 Enlarged prostate with lower urinary tract symptoms: Secondary | ICD-10-CM | POA: Diagnosis not present

## 2016-10-16 DIAGNOSIS — R351 Nocturia: Secondary | ICD-10-CM | POA: Diagnosis not present

## 2016-10-16 DIAGNOSIS — K045 Chronic apical periodontitis: Secondary | ICD-10-CM | POA: Diagnosis not present

## 2016-10-16 DIAGNOSIS — I071 Rheumatic tricuspid insufficiency: Secondary | ICD-10-CM | POA: Diagnosis not present

## 2016-10-16 DIAGNOSIS — Z79899 Other long term (current) drug therapy: Secondary | ICD-10-CM | POA: Diagnosis not present

## 2016-10-16 DIAGNOSIS — Z7982 Long term (current) use of aspirin: Secondary | ICD-10-CM | POA: Diagnosis not present

## 2016-10-16 DIAGNOSIS — K083 Retained dental root: Secondary | ICD-10-CM | POA: Diagnosis not present

## 2016-10-16 DIAGNOSIS — I739 Peripheral vascular disease, unspecified: Secondary | ICD-10-CM | POA: Diagnosis not present

## 2016-10-16 DIAGNOSIS — K029 Dental caries, unspecified: Secondary | ICD-10-CM | POA: Diagnosis not present

## 2016-10-16 DIAGNOSIS — I10 Essential (primary) hypertension: Secondary | ICD-10-CM | POA: Diagnosis not present

## 2016-10-16 LAB — CBC
HCT: 33.4 % — ABNORMAL LOW (ref 39.0–52.0)
HEMOGLOBIN: 10.7 g/dL — AB (ref 13.0–17.0)
MCH: 22.1 pg — ABNORMAL LOW (ref 26.0–34.0)
MCHC: 32 g/dL (ref 30.0–36.0)
MCV: 69 fL — ABNORMAL LOW (ref 78.0–100.0)
PLATELETS: 222 10*3/uL (ref 150–400)
RBC: 4.84 MIL/uL (ref 4.22–5.81)
RDW: 15 % (ref 11.5–15.5)
WBC: 7.8 10*3/uL (ref 4.0–10.5)

## 2016-10-16 LAB — BASIC METABOLIC PANEL
ANION GAP: 7 (ref 5–15)
BUN: 16 mg/dL (ref 6–20)
CALCIUM: 9 mg/dL (ref 8.9–10.3)
CO2: 25 mmol/L (ref 22–32)
CREATININE: 0.95 mg/dL (ref 0.61–1.24)
Chloride: 107 mmol/L (ref 101–111)
GLUCOSE: 99 mg/dL (ref 65–99)
Potassium: 3.5 mmol/L (ref 3.5–5.1)
Sodium: 139 mmol/L (ref 135–145)

## 2016-10-16 MED ORDER — CEFAZOLIN SODIUM-DEXTROSE 2-4 GM/100ML-% IV SOLN
2.0000 g | Freq: Once | INTRAVENOUS | Status: AC
  Administered 2016-10-17: 2 g via INTRAVENOUS
  Filled 2016-10-16: qty 100

## 2016-10-16 NOTE — Progress Notes (Addendum)
Mr Charles Patrick denies chest pain or shortness of breath.  Patient also denies history of high blood pressure.  Mr Charles Patrick appears to have some short term memory issues, he did repeat what time to arrive and shower instructions and when to stop eating and drinking and what medication to take in the am.  Patient has seen Dr. Lollie Marrow, cardiologist at [facility], I called and requested records- and I faxed a  Request. Patien'ts PCP is Arnette Felts, PA.

## 2016-10-16 NOTE — Pre-Procedure Instructions (Signed)
Charles Patrick  10/16/2016     No Pharmacies Listed   Your procedure is scheduled on Wednesday, October 17, 2016  Report to [facility] Admitting Entrance"A" at 5:30 A.M.  Call this number if you have problems the morning of surgery:  [phone]   Remember:  Do not eat food or drink liquids after midnight on October 16, 2016  Take these medicines the morning of surgery with A SIP OF WATER: Levothyroxine (SYNTHROID, LEVOTHROID).   As of today, stop taking all Aspirins, Vitamins, Fish oils, and Herbal medications. Also stop all NSAIDS i.e. Advil, Motrin, Aleve, Anaprox, Naproxen, BC and Goody Powders.   Do not wear jewelry.  Do not wear lotions, powders, or perfumes, or deodorant.  Do not shave 48 hours prior to surgery.  Men may shave face and neck.  Do not bring valuables to the hospital.  [facility] is not responsible for any belongings or valuables.  Contacts, dentures or bridgework may not be worn into surgery.  Leave your suitcase in the car.  After surgery it may be brought to your room.  For patients admitted to the hospital, discharge time will be determined by your treatment team.  Patients discharged the day of surgery will not be allowed to drive home.   Special instructions:  [place]- Preparing For Surgery  Before surgery, you can play an important role. Because skin is not sterile, your skin needs to be as free of germs as possible. You can reduce the number of germs on your skin by washing with CHG (chlorahexidine gluconate) Soap before surgery.  CHG is an antiseptic cleaner which kills germs and bonds with the skin to continue killing germs even after washing.  Please do not use if you have an allergy to CHG or antibacterial soaps. If your skin becomes reddened/irritated stop using the CHG.  Do not shave (including legs and underarms) for at least 48 hours prior to first CHG shower. It is OK to shave your face.  Please follow these instructions  carefully.   1. Shower the NIGHT BEFORE SURGERY and the MORNING OF SURGERY with CHG.   2. If you chose to wash your hair, wash your hair first as usual with your normal shampoo.  3. After you shampoo, rinse your hair and body thoroughly to remove the shampoo.   Wash your face and private area with the soap you use at home, then rinse.  4. Use CHG as you would any other liquid soap. You can apply CHG directly to the skin and wash gently with a scrungie or a clean washcloth.   5. Apply the CHG Soap to your body ONLY FROM THE NECK DOWN.  Do not use on open wounds or open sores. Avoid contact with your eyes, ears, mouth and genitals (private parts). Wash genitals (private parts) with your normal soap.  6. Wash thoroughly, paying special attention to the area where your surgery will be performed.  7. Thoroughly rinse your body with warm water from the neck down.  8. DO NOT shower/wash with your normal soap after using and rinsing off the CHG Soap.  9. Pat yourself dry with a CLEAN TOWEL.   10. Wear CLEAN PAJAMAS   11. Place CLEAN SHEETS on your bed the night of your first shower and DO NOT SLEEP WITH PETS. 12.  Day of Surgery: Shower as above Do not apply any deodorants/lotion, powders or cologne. Please wear clean clothes to the hospital/surgery center.  Do not wear jewelry.  Do not wear lotions, powders, or perfumes, or deodorant.  Do not shave 48 hours prior to surgery.  Men may shave face and neck.  Do not bring valuables to the hospital.  [facility] is not responsible for any belongings or valuables.  Contacts, dentures or bridgework may not be worn into surgery.  Leave your suitcase in the car.  After surgery it may be brought to your room.  For patients admitted to the hospital, discharge time will be determined by your treatment team.  Patients discharged the day of surgery will not be allowed to drive home.  Please read over the following fact sheets that you  were given. Pain Booklet, Coughing and Deep Breathing and Surgical Site Infection Prevention

## 2016-10-16 NOTE — Pre-Procedure Instructions (Signed)
Charles Patrick  10/16/2016     No Pharmacies Listed   Your procedure is scheduled on Wednesday, October 17, 2016  Report to [facility] Admitting Entrance"A" at 5:30 A.M.  Call this number if you have problems the morning of surgery:  [phone]   Remember:  Do not eat food or drink liquids after midnight on October 16, 2016  Take these medicines the morning of surgery with A SIP OF WATER: Levothyroxine (SYNTHROID, LEVOTHROID and  Tamsulosin (FLOMAX).  As of today, stop taking all Aspirins, Vitamins, Fish oils, and Herbal medications. Also stop all NSAIDS i.e. Advil, Motrin, Aleve, Anaprox, Naproxen, BC and Goody Powders.   Do not wear jewelry.  Do not wear lotions, powders, or perfumes, or deodorant.  Do not shave 48 hours prior to surgery.  Men may shave face and neck.  Do not bring valuables to the hospital.  [facility] is not responsible for any belongings or valuables.  Contacts, dentures or bridgework may not be worn into surgery.  Leave your suitcase in the car.  After surgery it may be brought to your room.  For patients admitted to the hospital, discharge time will be determined by your treatment team.  Patients discharged the day of surgery will not be allowed to drive home.   Special instructions:  [place]- Preparing For Surgery  Before surgery, you can play an important role. Because skin is not sterile, your skin needs to be as free of germs as possible. You can reduce the number of germs on your skin by washing with CHG (chlorahexidine gluconate) Soap before surgery.  CHG is an antiseptic cleaner which kills germs and bonds with the skin to continue killing germs even after washing.  Please do not use if you have an allergy to CHG or antibacterial soaps. If your skin becomes reddened/irritated stop using the CHG.  Do not shave (including legs and underarms) for at least 48 hours prior to first CHG shower. It is OK to shave your face.  Please  follow these instructions carefully.   1. Shower the NIGHT BEFORE SURGERY and the MORNING OF SURGERY with CHG.   2. If you chose to wash your hair, wash your hair first as usual with your normal shampoo.  3. After you shampoo, rinse your hair and body thoroughly to remove the shampoo.  4. Use CHG as you would any other liquid soap. You can apply CHG directly to the skin and wash gently with a scrungie or a clean washcloth.   5. Apply the CHG Soap to your body ONLY FROM THE NECK DOWN.  Do not use on open wounds or open sores. Avoid contact with your eyes, ears, mouth and genitals (private parts). Wash genitals (private parts) with your normal soap.  6. Wash thoroughly, paying special attention to the area where your surgery will be performed.  7. Thoroughly rinse your body with warm water from the neck down.  8. DO NOT shower/wash with your normal soap after using and rinsing off the CHG Soap.  9. Pat yourself dry with a CLEAN TOWEL.   10. Wear CLEAN PAJAMAS   11. Place CLEAN SHEETS on your bed the night of your first shower and DO NOT SLEEP WITH PETS. 12.  Day of Surgery: Do not apply any deodorants/lotions. Please wear clean clothes to the hospital/surgery center.    Please read over the following fact sheets that you were given. Pain Booklet, Coughing and Deep Breathing and Surgical  Site Infection Prevention

## 2016-10-17 ENCOUNTER — Encounter (HOSPITAL_COMMUNITY)
Admission: RE | Disposition: A | Discharge status: Home / Self Care | Payer: Self-pay | Source: Ambulatory Visit | Attending: Dentistry

## 2016-10-17 ENCOUNTER — Ambulatory Visit (HOSPITAL_COMMUNITY)
Admission: RE | Admit: 2016-10-17 | Discharge: 2016-10-17 | Disposition: A | Discharge status: Home / Self Care | Payer: Medicare HMO | Source: Ambulatory Visit | Attending: Dentistry | Admitting: Dentistry

## 2016-10-17 ENCOUNTER — Encounter (HOSPITAL_COMMUNITY): Payer: Self-pay | Admitting: Urology

## 2016-10-17 ENCOUNTER — Ambulatory Visit (HOSPITAL_COMMUNITY): Payer: Medicare HMO | Admitting: Certified Registered Nurse Anesthetist

## 2016-10-17 DIAGNOSIS — K045 Chronic apical periodontitis: Secondary | ICD-10-CM

## 2016-10-17 DIAGNOSIS — K0530 Chronic periodontitis, unspecified: Secondary | ICD-10-CM

## 2016-10-17 DIAGNOSIS — I739 Peripheral vascular disease, unspecified: Secondary | ICD-10-CM | POA: Insufficient documentation

## 2016-10-17 DIAGNOSIS — I071 Rheumatic tricuspid insufficiency: Secondary | ICD-10-CM | POA: Insufficient documentation

## 2016-10-17 DIAGNOSIS — K029 Dental caries, unspecified: Secondary | ICD-10-CM | POA: Diagnosis not present

## 2016-10-17 DIAGNOSIS — I10 Essential (primary) hypertension: Secondary | ICD-10-CM | POA: Insufficient documentation

## 2016-10-17 DIAGNOSIS — K083 Retained dental root: Secondary | ICD-10-CM | POA: Diagnosis not present

## 2016-10-17 DIAGNOSIS — Z79899 Other long term (current) drug therapy: Secondary | ICD-10-CM | POA: Insufficient documentation

## 2016-10-17 DIAGNOSIS — K08409 Partial loss of teeth, unspecified cause, unspecified class: Secondary | ICD-10-CM

## 2016-10-17 DIAGNOSIS — Z7982 Long term (current) use of aspirin: Secondary | ICD-10-CM | POA: Insufficient documentation

## 2016-10-17 DIAGNOSIS — I350 Nonrheumatic aortic (valve) stenosis: Secondary | ICD-10-CM | POA: Diagnosis not present

## 2016-10-17 DIAGNOSIS — N401 Enlarged prostate with lower urinary tract symptoms: Secondary | ICD-10-CM | POA: Insufficient documentation

## 2016-10-17 DIAGNOSIS — E039 Hypothyroidism, unspecified: Secondary | ICD-10-CM | POA: Insufficient documentation

## 2016-10-17 DIAGNOSIS — R351 Nocturia: Secondary | ICD-10-CM | POA: Insufficient documentation

## 2016-10-17 DIAGNOSIS — K0889 Other specified disorders of teeth and supporting structures: Secondary | ICD-10-CM

## 2016-10-17 DIAGNOSIS — E7800 Pure hypercholesterolemia, unspecified: Secondary | ICD-10-CM | POA: Insufficient documentation

## 2016-10-17 HISTORY — PX: MULTIPLE EXTRACTIONS WITH ALVEOLOPLASTY: SHX5342

## 2016-10-17 SURGERY — MULTIPLE EXTRACTION WITH ALVEOLOPLASTY
Anesthesia: General

## 2016-10-17 MED ORDER — EPHEDRINE SULFATE-NACL 50-0.9 MG/10ML-% IV SOSY
PREFILLED_SYRINGE | INTRAVENOUS | Status: DC | PRN
  Administered 2016-10-17 (×2): 5 mg via INTRAVENOUS

## 2016-10-17 MED ORDER — DEXAMETHASONE SODIUM PHOSPHATE 10 MG/ML IJ SOLN
INTRAMUSCULAR | Status: AC
  Filled 2016-10-17: qty 1

## 2016-10-17 MED ORDER — AMINOCAPROIC ACID SOLUTION 5% (50 MG/ML)
5.0000 mL | ORAL | Status: DC
  Filled 2016-10-17: qty 100

## 2016-10-17 MED ORDER — FENTANYL CITRATE (PF) 100 MCG/2ML IJ SOLN
INTRAMUSCULAR | Status: DC | PRN
Start: 2016-10-17 — End: 2016-10-17
  Administered 2016-10-17: 50 ug via INTRAVENOUS

## 2016-10-17 MED ORDER — ROCURONIUM BROMIDE 100 MG/10ML IV SOLN
INTRAVENOUS | Status: DC | PRN
  Administered 2016-10-17: 30 mg via INTRAVENOUS

## 2016-10-17 MED ORDER — PHENYLEPHRINE 40 MCG/ML (10ML) SYRINGE FOR IV PUSH (FOR BLOOD PRESSURE SUPPORT)
PREFILLED_SYRINGE | INTRAVENOUS | Status: DC | PRN
  Administered 2016-10-17 (×2): 25 ug via INTRAVENOUS
  Administered 2016-10-17: 120 ug via INTRAVENOUS

## 2016-10-17 MED ORDER — LIDOCAINE-EPINEPHRINE 2 %-1:100000 IJ SOLN
INTRAMUSCULAR | Status: AC
  Filled 2016-10-17: qty 10.2

## 2016-10-17 MED ORDER — LIDOCAINE 2% (20 MG/ML) 5 ML SYRINGE
INTRAMUSCULAR | Status: DC | PRN
  Administered 2016-10-17: 90 mg via INTRAVENOUS

## 2016-10-17 MED ORDER — 0.9 % SODIUM CHLORIDE (POUR BTL) OPTIME
TOPICAL | Status: DC | PRN
  Administered 2016-10-17: 1000 mL

## 2016-10-17 MED ORDER — ACETAMINOPHEN 160 MG/5ML PO SOLN
650.0000 mg | ORAL | Status: DC | PRN
  Filled 2016-10-17: qty 20.3

## 2016-10-17 MED ORDER — PROPOFOL 10 MG/ML IV BOLUS
INTRAVENOUS | Status: AC
  Filled 2016-10-17: qty 40

## 2016-10-17 MED ORDER — LIDOCAINE-EPINEPHRINE 2 %-1:100000 IJ SOLN
INTRAMUSCULAR | Status: DC | PRN
  Administered 2016-10-17: 6.9 mL via INTRADERMAL

## 2016-10-17 MED ORDER — PHENYLEPHRINE HCL 10 MG/ML IJ SOLN
INTRAVENOUS | Status: DC | PRN
  Administered 2016-10-17: 40 ug/min via INTRAVENOUS

## 2016-10-17 MED ORDER — SUGAMMADEX SODIUM 200 MG/2ML IV SOLN
INTRAVENOUS | Status: DC | PRN
  Administered 2016-10-17: 160 mg via INTRAVENOUS

## 2016-10-17 MED ORDER — ISOPROPYL ALCOHOL 70 % SOLN
Status: DC | PRN
  Administered 2016-10-17: 1 via TOPICAL

## 2016-10-17 MED ORDER — PROPOFOL 10 MG/ML IV BOLUS
INTRAVENOUS | Status: DC | PRN
  Administered 2016-10-17: 90 mg via INTRAVENOUS

## 2016-10-17 MED ORDER — LACTATED RINGERS IV SOLN
INTRAVENOUS | Status: DC | PRN
  Administered 2016-10-17: 08:00:00 via INTRAVENOUS

## 2016-10-17 MED ORDER — OXYMETAZOLINE HCL 0.05 % NA SOLN
NASAL | Status: AC
  Filled 2016-10-17: qty 15

## 2016-10-17 MED ORDER — BUPIVACAINE-EPINEPHRINE (PF) 0.5% -1:200000 IJ SOLN
INTRAMUSCULAR | Status: AC
  Filled 2016-10-17: qty 3.6

## 2016-10-17 MED ORDER — SUCCINYLCHOLINE CHLORIDE 200 MG/10ML IV SOSY
PREFILLED_SYRINGE | INTRAVENOUS | Status: DC | PRN
  Administered 2016-10-17: 100 mg via INTRAVENOUS

## 2016-10-17 MED ORDER — AMINOCAPROIC ACID SOLUTION 5% (50 MG/ML)
10.0000 mL | ORAL | Status: DC
  Filled 2016-10-17: qty 100

## 2016-10-17 MED ORDER — ACETAMINOPHEN 650 MG RE SUPP
650.0000 mg | RECTAL | Status: DC | PRN
  Filled 2016-10-17: qty 1

## 2016-10-17 MED ORDER — LACTATED RINGERS IV SOLN: INTRAVENOUS | Status: DC

## 2016-10-17 MED ORDER — FENTANYL CITRATE (PF) 250 MCG/5ML IJ SOLN
INTRAMUSCULAR | Status: AC
  Filled 2016-10-17: qty 5

## 2016-10-17 MED ORDER — ONDANSETRON HCL 4 MG/2ML IJ SOLN
INTRAMUSCULAR | Status: DC | PRN
  Administered 2016-10-17: 4 mg via INTRAVENOUS

## 2016-10-17 MED ORDER — BUPIVACAINE-EPINEPHRINE 0.5% -1:200000 IJ SOLN
INTRAMUSCULAR | Status: DC | PRN
  Administered 2016-10-17: 3.6 mL

## 2016-10-17 SURGICAL SUPPLY — 35 items
ALCOHOL 70% 16 OZ (MISCELLANEOUS) ×2 IMPLANT
ATTRACTOMAT 16X20 MAGNETIC DRP (DRAPES) ×2 IMPLANT
BLADE SURG 15 STRL LF DISP TIS (BLADE) ×2 IMPLANT
BLADE SURG 15 STRL SS (BLADE) ×2
COVER SURGICAL LIGHT HANDLE (MISCELLANEOUS) ×2 IMPLANT
GAUZE PACKING FOLDED 2  STR (GAUZE/BANDAGES/DRESSINGS) ×1
GAUZE PACKING FOLDED 2 STR (GAUZE/BANDAGES/DRESSINGS) ×1 IMPLANT
GAUZE SPONGE 4X4 16PLY XRAY LF (GAUZE/BANDAGES/DRESSINGS) ×2 IMPLANT
GLOVE BIOGEL PI IND STRL 6 (GLOVE) ×1 IMPLANT
GLOVE BIOGEL PI INDICATOR 6 (GLOVE) ×1
GLOVE SURG ORTHO 8.0 STRL STRW (GLOVE) ×2 IMPLANT
GLOVE SURG SS PI 6.0 STRL IVOR (GLOVE) ×2 IMPLANT
GOWN STRL REUS W/ TWL LRG LVL3 (GOWN DISPOSABLE) ×1 IMPLANT
GOWN STRL REUS W/TWL 2XL LVL3 (GOWN DISPOSABLE) ×2 IMPLANT
GOWN STRL REUS W/TWL LRG LVL3 (GOWN DISPOSABLE) ×1
HEMOSTAT SURGICEL 2X14 (HEMOSTASIS) ×2 IMPLANT
KIT BASIN OR (CUSTOM PROCEDURE TRAY) ×2 IMPLANT
KIT ROOM TURNOVER OR (KITS) ×2 IMPLANT
MANIFOLD NEPTUNE WASTE (CANNULA) ×2 IMPLANT
NEEDLE BLUNT 16X1.5 OR ONLY (NEEDLE) ×2 IMPLANT
NS IRRIG 1000ML POUR BTL (IV SOLUTION) ×2 IMPLANT
PACK EENT II TURBAN DRAPE (CUSTOM PROCEDURE TRAY) ×2 IMPLANT
PAD ARMBOARD 7.5X6 YLW CONV (MISCELLANEOUS) ×2 IMPLANT
SPONGE SURGIFOAM ABS GEL 100 (HEMOSTASIS) IMPLANT
SPONGE SURGIFOAM ABS GEL 12-7 (HEMOSTASIS) IMPLANT
SPONGE SURGIFOAM ABS GEL SZ50 (HEMOSTASIS) IMPLANT
SUCTION FRAZIER HANDLE 10FR (MISCELLANEOUS) ×1
SUCTION TUBE FRAZIER 10FR DISP (MISCELLANEOUS) ×1 IMPLANT
SUT CHROMIC 3 0 PS 2 (SUTURE) ×6 IMPLANT
SUT CHROMIC 4 0 P 3 18 (SUTURE) IMPLANT
SYR 50ML SLIP (SYRINGE) ×2 IMPLANT
TOWEL OR 17X26 10 PK STRL BLUE (TOWEL DISPOSABLE) ×2 IMPLANT
TUBE CONNECTING 12X1/4 (SUCTIONS) ×2 IMPLANT
WATER TABLETS ICX (MISCELLANEOUS) ×2 IMPLANT
YANKAUER SUCT BULB TIP NO VENT (SUCTIONS) ×2 IMPLANT

## 2016-10-17 NOTE — Transfer of Care (Signed)
Immediate Anesthesia Transfer of Care Note  Patient: Charles Patrick  Procedure(s) Performed: Procedure(s): Extraction of tooth #'s 4, 21,22,27,28,29 with alveoloplasty (N/A)  Patient Location: PACU  Anesthesia Type:General  Level of Consciousness: awake, alert  and oriented  Airway & Oxygen Therapy: Patient Spontanous Breathing and Patient connected to nasal cannula oxygen  Post-op Assessment: Report given to RN, Post -op Vital signs reviewed and stable and Patient moving all extremities  Post vital signs: Reviewed and stable  Last Vitals:  Vitals:   10/17/16 0635 10/17/16 0843  BP:    Pulse:    Resp:    Temp: 36.5 C (!) (P) 36.1 C  SpO2:      Last Pain:  Vitals:   10/17/16 0635  TempSrc: Oral      Patients Stated Pain Goal: 3 (10/17/16 0617)  Complications: No apparent anesthesia complications

## 2016-10-17 NOTE — Anesthesia Procedure Notes (Signed)
Procedure Name: Intubation Date/Time: 10/17/2016 7:45 AM Performed by: Trixie Deis A Pre-anesthesia Checklist: Patient identified, Emergency Drugs available, Suction available and Patient being monitored Patient Re-evaluated:Patient Re-evaluated prior to induction Oxygen Delivery Method: Circle System Utilized Preoxygenation: Pre-oxygenation with 100% oxygen Induction Type: IV induction Ventilation: Mask ventilation without difficulty Laryngoscope Size: Mac and 4 Grade View: Grade I Tube type: Oral Tube size: 7.5 mm Number of attempts: 1 Airway Equipment and Method: Stylet and Oral airway Placement Confirmation: ETT inserted through vocal cords under direct vision,  positive ETCO2 and breath sounds checked- equal and bilateral Secured at: 23 cm Tube secured with: Tape Dental Injury: Teeth and Oropharynx as per pre-operative assessment

## 2016-10-17 NOTE — Op Note (Signed)
OPERATIVE REPORT  Patient:            Charles Patrick Date of Birth:  07-26-1924 MRN:                [id number]   DATE OF PROCEDURE:  10/17/2016  PREOPERATIVE DIAGNOSES: 1. Severe aortic stenosis 2. Pre-heart valve surgery dental protocol 3. Chronic apical periodontitis 4. Dental caries 5. Retained root segments 6. Chronic periodontitis  7.  Loose teeth  POSTOPERATIVE DIAGNOSES: 1. Severe aortic stenosis 2. Pre-heart valve surgery dental protocol 3. Chronic apical periodontitis 4. Dental caries 5. Retained root segments 6. Chronic periodontitis 7. Loose teeth  OPERATIONS: 1. Multiple extraction of tooth numbers 4, 21, 22, 27, 28, and 29 2. 3 Quadrants of alveoloplasty   SURGEON: Charlynne Pander, DDS  ASSISTANT: Rory Percy, (dental assistant)  ANESTHESIA: General anesthesia via oral endotracheal tube.  MEDICATIONS: 1. Ancef 2 g IV prior to invasive dental procedures. 2. Local anesthesia with a total utilization of 4 carpules each containing 34 mg of lidocaine with 0.017 mg of epinephrine as well as 2 carpules each containing 9 mg of bupivacaine with 0.009 mg of epinephrine.  SPECIMENS: There are 6 teeth that were discarded.  DRAINS: None  CULTURES: None  COMPLICATIONS: None   ESTIMATED BLOOD LOSS: 100 mLs.  INTRAVENOUS FLUIDS: 400 mLs of Lactated ringers solution.  INDICATIONS: The patient was recently diagnosed with severe aortic stenosis.  A medically necessary dental consultation was then requested to evaluate poor dentition.  The patient was examined and treatment planned for extraction of remaining teeth with alveoloplasty in the operating room with general anesthesia.  This treatment plan was formulated to decrease the risks and complications associated with dental infection from affecting the patient's systemic health and anticipated heart valve surgery.  OPERATIVE FINDINGS: Patient was examined operating room number 10.  The teeth were identified  for extraction. The patient was noted be affected by chronic apical periodontitis, retained root segment, dental caries, chronic periodontitis, and loose teeth.   DESCRIPTION OF PROCEDURE: Patient was brought to the main operating room number 10. Patient was then placed in the supine position on the operating table. General anesthesia was then induced per the anesthesia team. The patient was then prepped and draped in the usual manner for dental medicine procedure. A timeout was performed. The patient was identified and procedures were verified. A throat pack was placed at this time. The oral cavity was then thoroughly examined with the findings noted above. The patient was then ready for dental medicine procedure as follows:  Local anesthesia was then administered sequentially with a total utilization of 4 carpules each containing 34 mg of lidocaine with 0.017 mg of epinephrine as well as 2 carpules  each containing 9 mg bupivacaine with 0.009 mg of epinephrine.  The Maxillary right quadrant was first approached. Anesthesia was then delivered utilizing infiltration with lidocaine with epinephrine. A #15 blade incision was then made from the distal of #2 and extended the mesial of #6.  A  surgical flap was then carefully reflected. Tooth #4 was then removed with a 150 forceps without complications. Alveoloplasty was then performed utilizing a rongeur and bone file to help achieve primary closure. The surgical site was then irrigated with copious amounts of sterile saline. The tissues were approximated and trimmed appropriately. The surgical site was then closed from the distal of #2 and extended the mesial #6 utilizing 3-0 chromic gut suture in a continuous interrupted suture technique 1.   At this  point time, the mandibular quadrants were approached. The patient was given bilateral inferior alveolar nerve blocks and long buccal nerve blocks utilizing the bupivacaine with epinephrine. Further infiltration  was then achieved utilizing the lidocaine with epinephrine. A 15 blade incision was then made from the distal of number 31 and extended to the mesial #25. A second 15 blade incision was then made from the distal of #19 and extended the mesial #24. Surgical flaps were then carefully reflected.  The lower teeth were then subluxated with a series of straight elevators. Tooth numbers 21, 22, 27, 28, 29 were then removed with a 151 forceps without complications. Alveoloplasty was then performed utilizing a rongeurs and bone file to help achieve primary closure. The tissues were approximated and trimmed appropriately. The surgical sites were then irrigated with copious amounts of sterile saline. The mandibular left surgical site was then closed from the distal of  19 and extended the mesial #24 utilizing 3-0 chromic gut suture in a continuous interrupted suture technique 1. The mandibular right surgical site was then closed from the distal of #31 and extended the mesial numbers 25 utilizing 3-0 chromic gut suture in a continuous interrupted suture technique 1.    At this point time, the entire mouth was irrigated with copious amounts of sterile saline. The patient was examined for complications, seeing none, the dental medicine procedure was deemed to be complete. The throat pack was removed at this time. An oral airway was then placed at the request of the anesthesia team. A series of 4 x 4 gauze moisitened with Amicar 5% rinse were placed in the mouth to aid hemostasis. The patient was then handed over to the anesthesia team for final disposition. After an appropriate amount of time, the patient was extubated and taken to the postanesthsia care unit in good condition. All counts were correct for the dental medicine procedure. The patient is to continue the Amicar 5% rinse post operatively. Patient is to rinse with 10 ML's every hour for the next 10 hours in a swish and spit manner. Dr. Cornelius Moras is to  proceed with heart  valve surgery after adequate healing from the dental extractions at his discretion.   Charlynne Pander, DDS.

## 2016-10-17 NOTE — Anesthesia Preprocedure Evaluation (Signed)
Anesthesia Evaluation  Patient identified by MRN, date of birth, ID band Patient awake    Reviewed: Allergy & Precautions, NPO status , Patient's Chart, lab work & pertinent test results  Airway Mallampati: II       Dental  (+) Poor Dentition, Loose, Missing   Pulmonary neg pulmonary ROS,    breath sounds clear to auscultation       Cardiovascular hypertension, + Peripheral Vascular Disease  + Valvular Problems/Murmurs AS  Rhythm:Regular Rate:Normal + Systolic murmurs    Neuro/Psych    GI/Hepatic negative GI ROS, Neg liver ROS,   Endo/Other    Renal/GU negative Renal ROS     Musculoskeletal   Abdominal   Peds  Hematology   Anesthesia Other Findings   Reproductive/Obstetrics                             Anesthesia Physical Anesthesia Plan  ASA: IV  Anesthesia Plan:    Post-op Pain Management:    Induction:   PONV Risk Score and Plan:   Airway Management Planned:   Additional Equipment:   Intra-op Plan:   Post-operative Plan:   Informed Consent:   Plan Discussed with:   Anesthesia Plan Comments:         Anesthesia Quick Evaluation

## 2016-10-17 NOTE — Anesthesia Procedure Notes (Signed)
Arterial Line Insertion Start/End8/22/2018 7:20 AM, 10/17/2016 7:25 AM Performed by: MASSAGEE, TERRY, HAYES, CHRISTINE T, CRNA  Patient location: Pre-op. Preanesthetic checklist: patient identified, IV checked, site marked, risks and benefits discussed, surgical consent, monitors and equipment checked, pre-op evaluation and timeout performed Lidocaine 1% used for infiltration Left, radial was placed Catheter size: 20 G  Attempts: 1 Procedure performed without using ultrasound guided technique. Following insertion, dressing applied and Biopatch. Post procedure assessment: normal  Patient tolerated the procedure well with no immediate complications.

## 2016-10-17 NOTE — Anesthesia Postprocedure Evaluation (Signed)
Anesthesia Post Note  Patient: Charles Patrick  Procedure(s) Performed: Procedure(s) (LRB): Extraction of tooth #'s 4, [phone] with alveoloplasty (N/A)     Patient location during evaluation: PACU Anesthesia Type: General Level of consciousness: awake and alert Pain management: pain level controlled Vital Signs Assessment: post-procedure vital signs reviewed and stable Respiratory status: spontaneous breathing, nonlabored ventilation, respiratory function stable and patient connected to nasal cannula oxygen Cardiovascular status: blood pressure returned to baseline and stable Postop Assessment: no signs of nausea or vomiting Anesthetic complications: no    Last Vitals:  Vitals:   10/17/16 0945 10/17/16 1000  BP: 110/78 115/81  Pulse: (!) 59   Resp: 15 15  Temp:  (!) 36.2 C  SpO2: 97%     Last Pain:  Vitals:   10/17/16 0635  TempSrc: Oral                 Creedence Kunesh,JAMES TERRILL

## 2016-10-17 NOTE — Progress Notes (Signed)
PRE-OPERATIVE NOTE:  10/17/2016 Charles Patrick [id number]  VITALS: BP (!) 143/89   Pulse (!) 58   Temp 97.7 F (36.5 C) (Oral)   Resp 18   SpO2 98%   Lab Results  Component Value Date   WBC 7.8 10/16/2016   HGB 10.7 (L) 10/16/2016   HCT 33.4 (L) 10/16/2016   MCV 69.0 (L) 10/16/2016   PLT 222 10/16/2016   BMET    Component Value Date/Time   NA 139 10/16/2016 1608   K 3.5 10/16/2016 1608   CL 107 10/16/2016 1608   CO2 25 10/16/2016 1608   GLUCOSE 99 10/16/2016 1608   BUN 16 10/16/2016 1608   CREATININE 0.95 10/16/2016 1608   CALCIUM 9.0 10/16/2016 1608   GFRNONAA >60 10/16/2016 1608   GFRAA >60 10/16/2016 1608    No results found for: INR, PROTIME No results found for: PTT   Charles Patrick presents for dental procedures in the operating room.   SUBJECTIVE: The patient denies any acute medical or dental changes and agrees to proceed with treatment as planned.  EXAM: No sign of acute dental changes.  ASSESSMENT: Patient is affected by chronic periodontitis, retained root segments, dental caries, chronic periodontitis and loose teeth.  PLAN: Patient agrees to proceed with treatment as planned in the operating room as previously discussed and accepts the risks, benefits, and complications of the proposed treatment. Patient is aware of the risk for bleeding, bruising, swelling, infection, pain, nerve damage,soft tissue damage, sinus involvement, root tip fracture, mandible fracture, and the risks of complications associated with the anesthesia. Patient also is aware of the potential for other complications up to and including death due to overall cardiovascular and respiratory compromise.    Charlynne Pander, DDS

## 2016-10-17 NOTE — Discharge Instructions (Signed)

## 2016-10-17 NOTE — H&P (Signed)
10/17/2016  Patient:            Charles Patrick Date of Birth:  26-Nov-1924 MRN:                [id number]   BP (!) 143/89   Pulse (!) 58   Temp 97.7 F (36.5 C) (Oral)   Resp 18   SpO2 98%   [name] presents for multiple dental extractions with alveoloplasty in the operating room with general anesthesia. Patient denies any acute medical or dental changes. Please use note from Dr. Cornelius Moras on 10/08/16  to act as H&P for the dental operating room procedure.  Charlynne Pander, DDS    Progress Notes Encounter Date: 10/08/2016 Purcell Nails, MD  Cardiothoracic Surgery    [] Hide copied text [] Hover for attribution information  [facility]  MULTIDISCIPLINARY HE[facility] VALVE CLINIC  CARDIOTHORACIC SURGERY CONSULTATION REPORT  Referring Provider is Elise Benne [name], PA-C  PCP is [name], PA-C      Chief Complaint  Patient presents with  . Aortic Stenosis    Surgical eval, ECHO 09/03/16 @ [facility]     HPI:  Patient is an 83 year old African-American male with history of aortic stenosis, hypertension, hyperlipidemia, peripheral vascular disease, and hypothyroidism with been referred for surgical consultation to discuss management options for treatment of severe aortic stenosis. The patient states that he was first noted have a heart murmur on physical exam many years ago he has been followed regularly ever since at [facility] in [place], for the last several years by Arnette Felts.  He has remained physically active despite his advanced age, but serial echocardiograms have documented significant progression of disease. Most recent follow-up echocardiogram performed 09/03/2016 revealed velocity across the aortic valve measured as high as 4.5 m/s corresponding to mean transvalvular gradient estimated 54.9 mmHg and aortic valve area calculated 0.6 cm. Left ventricular systolic function remained normal with ejection fraction estimated 60-65%.  The patient was referred for surgical consultation.  The patient is married and lives locally in [place] with his wife.  He is a bit of a difficult historian and wonders off topic frequently. However, he seems to have no significant memory problems. He is here alone for this consultation today. 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 He does admit that over the last 2 or 3 years he has noticed a tendency to get fatigued more easily than he had in the past. However, he specifically denies any symptoms of exertional shortness of breath or chest discomfort. He has not had any tachycardia palpitations or dizzy spells. He denies any history of PND, orthopnea, or lower extremity edema. Appetite is good and his weight is stable.      Past Medical History:  Diagnosis Date  . Anemia   . Aortic valve stenosis   . Arthropathy   . BPH associated with nocturia   . Carotid artery disease (HCC)   . Colon polyp   . Dilated aortic root (HCC)   . Elevated PSA   . Erectile dysfunction   . Gastric paresis   . Hypercholesterolemia   . Hyperglycemia   . Hypertension   . Hypothyroid   . Iron deficiency   . PAD (peripheral artery disease) (HCC)   . Pulmonary hypertension (HCC)   . Seizure disorder (HCC)   . Thoracic aortic aneurysm (HCC)   . Thrombocytopenia (HCC)   . Thyroid nodule   .  Tricuspid regurgitation   . Vitamin D deficiency          Past Surgical History:  Procedure Laterality Date  . NO PAST SURGERIES      No family history on file.  Social History        Social History  . Marital status: Married    Spouse name: N/A  . Number of children: N/A  . Years of education: N/A      Occupational History  . Not on file.       Social History Main Topics  . Smoking status: Never Smoker  . Smokeless tobacco: Never Used  . Alcohol use No  . Drug use: No  . Sexual activity: Not  Currently       Other Topics Concern  . Not on file      Social History Narrative  . No narrative on file          Current Outpatient Prescriptions  Medication Sig Dispense Refill  . aspirin EC 81 MG tablet 81 mg.    . levothyroxine (SYNTHROID, LEVOTHROID) 50 MCG tablet     . simvastatin (ZOCOR) 40 MG tablet 40 mg.    . tamsulosin (FLOMAX) 0.4 MG CAPS capsule Take 0.4 mg by mouth daily.     No current facility-administered medications for this visit.     No Known Allergies    Review of Systems:              General:                      normal appetite, decreased energy, no weight gain, no weight loss, no fever             Cardiac:                       no chest pain with exertion, no chest pain at rest, no SOB with exertion, no resting SOB, no PND, no orthopnea, no palpitations, no arrhythmia, no atrial fibrillation, no LE edema, no dizzy spells, no syncope             Respiratory:                 no shortness of breath, no home oxygen, no productive cough, no dry cough, no bronchitis, no wheezing, no hemoptysis, no asthma, no pain with inspiration or cough, no sleep apnea, no CPAP at night             GI:                               no difficulty swallowing, no reflux, no frequent heartburn, no hiatal hernia, no abdominal pain, no constipation, no diarrhea, no hematochezia, no hematemesis, no melena             GU:                              no dysuria,  no frequency, no urinary tract infection, no hematuria, no enlarged prostate, no kidney stones, no kidney disease             Vascular:                     no pain suggestive of claudication, no pain in feet, no leg cramps, no varicose veins, no  DVT, no non-healing foot ulcer             Neuro:                         no stroke, no TIA's, no seizures, no headaches, no temporary blindness one eye,  no slurred speech, no peripheral neuropathy, no chronic pain, no instability of gait, no memory/cognitive  dysfunction             Musculoskeletal:         no arthritis, no joint swelling, no myalgias, no difficulty walking, normal mobility              Skin:                            no rash, no itching, no skin infections, no pressure sores or ulcerations             Psych:                         no anxiety, no depression, no nervousness, no unusual recent stress             Eyes:                           no blurry vision, no floaters, no recent vision changes, + wears glasses or contacts             ENT:                            no hearing loss, + loose or painful teeth, + partial dentures, last saw dentist many years ago             Hematologic:               no easy bruising, no abnormal bleeding, no clotting disorder, no frequent epistaxis             Endocrine:                   no diabetes, does not check CBG's at home                                                       Physical Exam:              BP 140/84   Pulse 72   Resp 20   Ht (!) 6.4" (0.163 m)   Wt 178 lb (80.7 kg)   SpO2 92% Comment: RA  BMI 3055.36 kg/m              General:                      Elderly male NAD, well-appearing             HEENT:                       Unremarkable but poor dentition             Neck:  no JVD, no bruits, no adenopathy              Chest:                          clear to auscultation, symmetrical breath sounds, no wheezes, no rhonchi              CV:                              RRR, grade IV/VI crescendo/decrescendo murmur heard best at RUSB,  no diastolic murmur             Abdomen:                    soft, non-tender, no masses              Extremities:                 warm, well-perfused, pulses palpable, no LE edema             Rectal/GU                   Deferred             Neuro:                         Grossly non-focal and symmetrical throughout             Skin:                            Clean and dry, no rashes, no  breakdown   Diagnostic Tests:  TRANSTHORACIC ECHOCARDIOGRAM  Both images and report from transthoracic echocardiogram performed 09/03/2016 at [facility] are reviewed. The patient has severe aortic stenosis with normal left ventricular systolic function. Aortic valve may be functionally bicuspid with a single raphe.  There is severe thickening, calcification, and restricted leaflet mobility involving all 3 leaflets although one of the leaflets move better than the other 2. Peak velocity across the aortic valve measured greater than 4.5 m/s corresponding to mean transvalvular gradient estimated 55 mmHg. Left ventricular systolic function appears normal. There is mild concentric left ventricular hypertrophy. The aortic root and overall size of the aortic valve is relatively large. The ascending thoracic aorta measured at least 4.6 cm. No other significant abnormalities are noted.   Impression:  The patient has at least stage C1 and possibly early stage D severe aortic stenosis. He also has at least moderate fusiform aneurysmal enlargement of the ascending thoracic aorta, and his aortic valve may be functioning bicuspid. The patient claims to be essentially asymptomatic and he reportedly is fairly active physically. He does admit to some degree of exertional fatigue which may be consistent with early chronic diastolic congestive heart failure.  He appears to have poor dentition and has not seen a dentist in years.   Plan:  The patient was counseled at length regarding treatment alternatives for management of severe aortic stenosis. The natural history of disease in the importance of a the presence or absence of symptoms have been discussed. Alternative approaches such as conventional aortic valve replacement, transcatheter aortic valve replacement, and palliative medical therapy were compared and contrasted at length.  We discussed a variety of options at this juncture including  continued medical  therapy with very close follow-up versus exercise stress testing as a means to stratify whether not the patient is truly asymptomatic and/or at risk for significant disease progression in the near future. We plan to proceed with formal cardiopulmonary exercise testing was stress echocardiography as a next step. If the patient does well then we can potentially follow him closely on medical therapy with confidence. The patient will undergo CT angiography to more precisely measure the size of the aortic root and ascending thoracic aorta. Finally, the patient will be referred for dental service consultation. The patient will return to our office in approximately 4 weeks to review the results of his stress test and CT angiograms.  I have asked that he bring his wife with him for his next office visit.  All of his questions have been addressed.   I spent in excess of 90 minutes during the conduct of this office consultation and >50% of this time involved direct face-to-face encounter with the patient for counseling and/or coordination of their care.     Salvatore Decent. Cornelius Moras, MD 10/08/2016 5:51 PM      Electronically signed by Purcell Nails, MD at 10/08/2016 7:02 PM

## 2016-10-18 ENCOUNTER — Ambulatory Visit (HOSPITAL_COMMUNITY): Payer: Medicare HMO

## 2016-10-18 ENCOUNTER — Encounter (HOSPITAL_COMMUNITY): Payer: Self-pay | Admitting: Dentistry

## 2016-10-22 ENCOUNTER — Ambulatory Visit (HOSPITAL_COMMUNITY): Payer: Medicare HMO | Attending: Cardiology

## 2016-10-22 ENCOUNTER — Other Ambulatory Visit (HOSPITAL_COMMUNITY): Payer: Self-pay | Admitting: *Deleted

## 2016-10-22 ENCOUNTER — Ambulatory Visit (HOSPITAL_COMMUNITY)
Admission: RE | Admit: 2016-10-22 | Discharge: 2016-10-22 | Disposition: A | Discharge status: Home / Self Care | Payer: Medicare HMO | Source: Ambulatory Visit | Attending: Thoracic Surgery (Cardiothoracic Vascular Surgery) | Admitting: Thoracic Surgery (Cardiothoracic Vascular Surgery)

## 2016-10-22 DIAGNOSIS — I2510 Atherosclerotic heart disease of native coronary artery without angina pectoris: Secondary | ICD-10-CM | POA: Insufficient documentation

## 2016-10-22 DIAGNOSIS — I700 Atherosclerosis of aorta: Secondary | ICD-10-CM | POA: Diagnosis not present

## 2016-10-22 DIAGNOSIS — I712 Thoracic aortic aneurysm, without rupture: Secondary | ICD-10-CM | POA: Insufficient documentation

## 2016-10-22 DIAGNOSIS — I350 Nonrheumatic aortic (valve) stenosis: Secondary | ICD-10-CM | POA: Diagnosis not present

## 2016-10-22 DIAGNOSIS — I77819 Aortic ectasia, unspecified site: Secondary | ICD-10-CM | POA: Diagnosis not present

## 2016-10-22 NOTE — Progress Notes (Signed)
  Echocardiogram 2D Echocardiogram has been performed.  Charles Patrick 10/22/2016, 1:06 PM

## 2016-10-25 ENCOUNTER — Ambulatory Visit (HOSPITAL_COMMUNITY)
Admission: RE | Admit: 2016-10-25 | Discharge: 2016-10-25 | Disposition: A | Discharge status: Home / Self Care | Payer: Medicare HMO | Source: Ambulatory Visit | Attending: Thoracic Surgery (Cardiothoracic Vascular Surgery) | Admitting: Thoracic Surgery (Cardiothoracic Vascular Surgery)

## 2016-10-25 ENCOUNTER — Ambulatory Visit (HOSPITAL_COMMUNITY): Payer: Medicare HMO

## 2016-10-25 ENCOUNTER — Encounter (HOSPITAL_COMMUNITY): Payer: Self-pay

## 2016-10-25 DIAGNOSIS — I350 Nonrheumatic aortic (valve) stenosis: Secondary | ICD-10-CM

## 2016-10-25 MED ORDER — IOPAMIDOL (ISOVUE-370) INJECTION 76%
INTRAVENOUS | Status: AC
  Administered 2016-10-25: 100 mL
  Filled 2016-10-25: qty 100

## 2016-10-25 MED ORDER — METOPROLOL TARTRATE 5 MG/5ML IV SOLN
5.0000 mg | Freq: Once | INTRAVENOUS | Status: AC
  Administered 2016-10-25: 5 mg via INTRAVENOUS

## 2016-10-25 MED ORDER — METOPROLOL TARTRATE 5 MG/5ML IV SOLN
INTRAVENOUS | Status: AC
  Administered 2016-10-25: 5 mg via INTRAVENOUS
  Filled 2016-10-25: qty 5

## 2016-10-30 ENCOUNTER — Encounter (HOSPITAL_COMMUNITY): Payer: Self-pay | Admitting: Dentistry

## 2016-10-30 ENCOUNTER — Ambulatory Visit (HOSPITAL_COMMUNITY): Payer: Self-pay | Admitting: Dentistry

## 2016-10-30 ENCOUNTER — Ambulatory Visit (HOSPITAL_COMMUNITY): Payer: Medicaid - Dental | Admitting: Dentistry

## 2016-10-30 VITALS — BP 109/71 | HR 70 | Temp 97.9°F

## 2016-10-30 DIAGNOSIS — I350 Nonrheumatic aortic (valve) stenosis: Secondary | ICD-10-CM

## 2016-10-30 DIAGNOSIS — K08199 Complete loss of teeth due to other specified cause, unspecified class: Secondary | ICD-10-CM

## 2016-10-30 DIAGNOSIS — K08109 Complete loss of teeth, unspecified cause, unspecified class: Secondary | ICD-10-CM

## 2016-10-30 DIAGNOSIS — Z01818 Encounter for other preprocedural examination: Secondary | ICD-10-CM

## 2016-10-30 DIAGNOSIS — K0820 Unspecified atrophy of edentulous alveolar ridge: Secondary | ICD-10-CM

## 2016-10-30 NOTE — Progress Notes (Signed)
POST OPERATIVE NOTE:  10/30/2016 Efraim Kaufmannavid B Sledge [id number]  VITALS: BP 109/71 (BP Location: Left Arm)   Pulse 70   Temp 97.9 F (36.6 C) (Oral)   LABS:  Lab Results  Component Value Date   WBC 7.8 10/16/2016   HGB 10.7 (L) 10/16/2016   HCT 33.4 (L) 10/16/2016   MCV 69.0 (L) 10/16/2016   PLT 222 10/16/2016   BMET    Component Value Date/Time   NA 139 10/16/2016 1608   K 3.5 10/16/2016 1608   CL 107 10/16/2016 1608   CO2 25 10/16/2016 1608   GLUCOSE 99 10/16/2016 1608   BUN 16 10/16/2016 1608   CREATININE 0.95 10/16/2016 1608   CALCIUM 9.0 10/16/2016 1608   GFRNONAA >60 10/16/2016 1608   GFRAA >60 10/16/2016 1608    No results found for: INR, PROTIME No results found for: PTT   Efraim Kaufmannavid B Braley is status post Multiple extractions with alveoloplasty in the operating room with general anesthesia on 10/17/2016. Patient now presents for evaluation of healing and suture removal as needed.  SUBJECTIVE: Patient without complaints of dental extraction pain. No stitches remain.   EXAM: There is no sign of infection, heme, or ooze. Patient is healing in by generalized primary closure. No sutures remain. The patient is now completely edentulous.  PROCEDURE: The patient was given a chlorhexidine gluconate rinse for 30 seconds.No sutures needed to be removed.  ASSESSMENT: Post operative course is consistent with dental procedures performed in the operating room with general anesthesia Loss of teeth due to extraction Patient is now edentulous. There is atrophy of the edentulous alveolar ridges.  PLAN: 1. Continue salt water rinses as needed to aid healing. 2. Advance diet as tolerated. 3. Follow-up with a dentist of his choice for fabrication of upper and lower complete dentures after adequate healing and once medically cleared from the anticipated heart valve surgery. 4. Patient is now cleared to proceed with heart valve surgery with [name] at Dr. Orvan Julywen's  discretion   Charlynne Panderonald F. Elvis Laufer, DDS

## 2016-10-30 NOTE — Patient Instructions (Signed)
PLAN: 1. Continue salt water rinses as needed to aid healing. 2. Advance diet as tolerated. 3. Follow-up with a dentist of his choice for fabrication of upper and lower complete dentures after adequate healing and once medically cleared from the anticipated heart valve surgery. 4. Patient is now cleared to proceed with heart valve surgery with Dr. Owen at Dr. OweCornelius Morasn's discretiOrvan Julyon   Charlynne Panderonald F. Kulinski, DDS

## 2016-11-05 ENCOUNTER — Other Ambulatory Visit: Payer: Self-pay | Admitting: *Deleted

## 2016-11-05 ENCOUNTER — Ambulatory Visit (INDEPENDENT_AMBULATORY_CARE_PROVIDER_SITE_OTHER): Payer: Medicare HMO | Admitting: Thoracic Surgery (Cardiothoracic Vascular Surgery)

## 2016-11-05 ENCOUNTER — Encounter: Payer: Self-pay | Admitting: Thoracic Surgery (Cardiothoracic Vascular Surgery)

## 2016-11-05 VITALS — BP 115/73 | HR 71 | Resp 18 | Ht 76.0 in | Wt 177.0 lb

## 2016-11-05 DIAGNOSIS — I350 Nonrheumatic aortic (valve) stenosis: Secondary | ICD-10-CM

## 2016-11-05 NOTE — Patient Instructions (Addendum)
Continue all previous medications without any changes at this time  Avoid strenuous exertion and avoid working in hot weather, getting overheated or dehydrated  Call and return sooner if you develop shortness of breath, chest discomfort, and/or dizzy spells

## 2016-11-05 NOTE — Progress Notes (Signed)
[address]       [place] [address]             [phone]     CARDIOTHORACIC SURGERY OFFICE NOTE  Referring Provider is [name], PA-C  Primary Cardiologist is Lollie Marrow, MD PCP is [name], PA-C   HPI:  Patient returns to the office today for follow-up of severe aortic stenosis. He was originally seen in consultation on 10/08/2016. He claims to be remarkably active physically and essentially asymptomatic. After discussing options at length the patient subsequently underwent cardiopulmonary stress testing with exercise stress echocardiography on 10/22/2016. He states that he did well during the test and he did not experience significant shortness of breath or chest discomfort. He subsequently underwent CT angiography to evaluate the feasibility of transcatheter aortic valve replacement. He recently underwent dental extraction by Dr. Kristin Bruins and he returns to the office today for follow up.  He reports no new problems or complaints, although he admits that it took him several days to get his energy back and he had some shortness of breath early following dental extraction.   Current Outpatient Prescriptions  Medication Sig Dispense Refill  . aspirin EC 81 MG tablet Take 81 mg by mouth daily.     Marland Kitchen levothyroxine (SYNTHROID, LEVOTHROID) 50 MCG tablet Take 50 mcg by mouth daily before breakfast.     . simvastatin (ZOCOR) 40 MG tablet Take 40 mg by mouth daily at 6 PM.     . tamsulosin (FLOMAX) 0.4 MG CAPS capsule Take 0.4 mg by mouth daily after supper.      No current facility-administered medications for this visit.       Physical Exam:   BP 115/73   Pulse 71   Resp 18   Ht  (1.93 m)   Wt 177 lb (80.3 kg)   SpO2 98% Comment: RA  BMI 21.55 kg/m   General:  Elderly but well-appearing  Chest:   Clear to auscultation  CV:   Regular rate and rhythm with harsh prominent crescendo decrescendo systolic  murmur  Incisions:  n/a  Abdomen:  Soft nontender  Extremities:  Warm and well-perfused, no lower extremity edema  Diagnostic Tests:  Stress Echocardiography  Patient:    Kristoph, Sattler MR #:       [id number] Study Date: 10/22/2016 Gender:     M Age:        83 Height:     193 cm Weight:     80 kg BSA:        2.06 m^2 Pt. Status: Room:   ATTENDING    Tressie Stalker, M.D.  ORDERING     Tressie Stalker, M.D.  REFERRING    Tressie Stalker, M.D.  SONOGRAPHER  Perley Jain, RDCS  PERFORMING   [facility], Inpatient  SONOGRAPHER  Sheralyn Boatman  cc:  -------------------------------------------------------------------  ------------------------------------------------------------------- Indications:      Aortic stenosis 424.1.  ------------------------------------------------------------------- History:   PMH:   Aortic valve disease.  ------------------------------------------------------------------- Study Conclusions  - Aortic valve: Valve area (VTI): 1.64 cm^2. Valve area (Vmax):   1.86 cm^2. Valve area (Vmean): 1.49 cm^2. - Stress ECG conclusions: There were no stress arrhythmias or   conduction abnormalities. The stress ECG was negative for   ischemia. - Staged echo: There was no echocardiographic evidence for   stress-induced ischemia.  Impressions:  - Baseline LVEF 65-70%, transaortic gradients peak/mean 66/39 mmHg.   With exercise (stationary bike) peak/mean gradients  103/58 mmHg   consistent with critical aortic stenosis.   Aortic valve is severely thickened and calcified with severely   restricted leaflet opening.  ------------------------------------------------------------------- Study data:   Study status:  Routine.  Consent:  The risks, benefits, and alternatives to the procedure were explained to the patient and informed consent was obtained.  Procedure:  Study was performed on a stationary bike in conjunction with a CPX test to evaluate aortic stenosis  severity. Initial setup. The patient was brought to the laboratory. A baseline ECG was recorded. Surface ECG leads and automatic cuff blood pressure measurements were monitored. Exercise testing was performed. Transthoracic stress echocardiography. Images were captured at baseline and peak exercise.  Study completion:  The patient tolerated the procedure well. There were no complications.          Modified Bruce protocol. Stress echocardiography.  Birthdate:  Patient birthdate: 12-04-1924.  Age:  Patient is 83 yr old.  Sex:  Gender: male. BMI: 21.5 kg/m^2.  Patient status:  Outpatient.  Study date:  Study date: 10/22/2016. Study time: 11:03 AM.  -------------------------------------------------------------------  ------------------------------------------------------------------- Aortic valve:   Doppler:     VTI ratio of LVOT to aortic valve: 0.39. Valve area (VTI): 1.64 cm^2. Indexed valve area (VTI): 0.79 cm^2/m^2. Peak velocity ratio of LVOT to aortic valve: 0.45. Valve area (Vmax): 1.86 cm^2. Indexed valve area (Vmax): 0.9 cm^2/m^2. Mean velocity ratio of LVOT to aortic valve: 0.36. Valve area (Vmean): 1.49 cm^2. Indexed valve area (Vmean): 0.72 cm^2/m^2. Mean gradient (S): 49 mm Hg. Peak gradient (S): 102 mm Hg.   ------------------------------------------------------------------- Baseline ECG:  Normal.  ------------------------------------------------------------------- Stress protocol:  +--------+--------+ !Stage   !Symptoms! +--------+--------+ !Baseline!None    ! +--------+--------+  ------------------------------------------------------------------- Stress results:   The target heart rate was achieved. The heart rate response to stress was normal.   There was a normal resting blood pressure with an appropriate response to stress.  The patient experienced no chest pain during stress.  ------------------------------------------------------------------- Stress  ECG:  There were no stress arrhythmias or conduction abnormalities.  The stress ECG was negative for ischemia.   ------------------------------------------------------------------- Stress echo results:     Left ventricular ejection fraction was normal at rest and with stress. There was no echocardiographic evidence for stress-induced ischemia.  ------------------------------------------------------------------- Measurements   Left ventricle                           Value  Stroke volume, 2D                        133   ml  Stroke volume/bsa, 2D                    64    ml/m^2    LVOT                                     Value  LVOT ID, S                               23    mm  LVOT area                                4.15  cm^2  LVOT peak  velocity, S                    227   cm/s  LVOT mean velocity, S                    116   cm/s  LVOT VTI, S                              32    cm  LVOT peak gradient, S                    21    mm Hg    Aortic valve                             Value  Aortic valve peak velocity, S            506   cm/s  Aortic valve mean velocity, S            324   cm/s  Aortic valve VTI, S                      81.1  cm  Aortic mean gradient, S                  49    mm Hg  Aortic peak gradient, S                  102   mm Hg  VTI ratio, LVOT/AV                       0.39  Aortic valve area, VTI                   1.64  cm^2  Aortic valve area/bsa, VTI               0.79  cm^2/m^2  Velocity ratio, peak, LVOT/AV            0.45  Aortic valve area, peak velocity         1.86  cm^2  Aortic valve area/bsa, peak velocity     0.9   cm^2/m^2  Velocity ratio, mean, LVOT/AV            0.36  Aortic valve area, mean velocity         1.49  cm^2  Aortic valve area/bsa, mean velocity     0.72  cm^2/m^2  Legend: (L)  and  (H)  mark values outside specified reference range.  ------------------------------------------------------------------- Prepared and Electronically  Authenticated by  Tobias Alexander, M.D. 2018-08-27T14:12:08    CARDIOPULMONARY EXERCISE STRESS TESTING  Pre-Exercise PFTs   FVC 3.56 (95%)     FEV1 2.70 (101%)      FEV1/FVC 76 (103%)      MVV 88 (71%)      Exercise Time:  8:00   Watts: 70  RPE: 17  Reason stopped: Patient ended test due to leg fatigue and difficulty maintaining pedal contact on cycle.   Additional symptoms: Dyspnea (4/10)  Resting HR: 69 Peak HR: 113  (86% age predicted max HR)  BP rest: 106/64 BP peak: 150/78  Peak VO2: 16.1 (91% predicted peak VO2)  VE/VCO2 slope: 44  OUES: 1.45  Peak RER: 1.17  Ventilatory Threshold: 13.7 (78% predicted  or measured peak VO2)  Peak RR 32  Peak Ventilation: 60.8  VE/MVV: 69%  PETCO2 at peak: 25  O2pulse: 12  (109% predicted O2pulse)   Interpretation  Notes: Patient gave a very good effort. Pulse-oximetry remained within 95-99% for the duration of exercise. Exercise was performed on a cycle ergometer starting at [facility] and increasing by 10W/min. Rest and immediate post-exercise echo were performed.   ECG: Resting ECG in normal sinus rhythm with left axis deviation and previous infarct. HR Response appropriate. There were no sustained arrhythmias and no ST-T changes. BP response appropriate.   PFT: Pre-exercise spirometry was within normal limits. The MVV was normal.   CPX: Exercise testing with gas exchange demonstrates a normal peak VO2 of 16.1 ml/kg/min (91% of the age/gender/weight matched sedentary norms). The RER of 1.17 indicates a maximal effort. The VE/VCO2 slope is significantly elevated and indicates excessive dead space ventilation. The oxygen uptake efficiency slope (OUES) is normal. The VO2 at the ventilatory threshold was normal at 78% of the predicted peak VO2. At peak exercise, the ventilation reached 69% of the measured MVV indicating ventilatory limits were beginning to approach. The O2pulse (a surrogate for stroke  volume) increased with incremental exercise, reaching peak at 12 ml/beat (109%).   Conclusion: Exercise testing with gas exchange demonstrates a normal functional capacity when compared to matched sedentary norms. There is no indication for pulmonary limitations. Although PVO2 within normal range, patient is likely moderately circulatory limited with significantly elevated VE/VCO2 slope, which indicates excessive dead space ventilation.   Test, report and preliminary impression by: Lesia Hausen, MS, ACSM-RCEP 10/22/2016 3:02 PM  Agree with above. Probable mild circulatory limitation. Markedly elevated VeVCO2 suggests elevated pulmonary pressures during exercise.   Arvilla Meres, MD  6:39 PM    Cardiac TAVR CT  TECHNIQUE: The patient was scanned on a Siemens 192 slice scanner. A 120 kV retrospective scan was triggered in the ascending thoracic aorta at 140 HU's. Gantry rotation speed was 250 msecs and collimation was .6 mm. Lopressor 5 mg given the 3D data set was reconstructed in 5% intervals of the R-R cycle. Systolic and diastolic phases were analyzed on a dedicated work station using MPR, MIP and VRT modes. The patient received 80 cc of contrast.  FINDINGS: Aortic Valve: Tri leaflet severely calcified valve with restricted leaflet motion  Aorta: Moderate aortic root dilatation. Significant angulation of the mid aortic arch after the take off of the left subclavian. Marked tortuosity of the descending thoracic aorta  Sinotubular Junction:  34.6 mm  Ascending Thoracic Aorta:  46 mm  Aortic Arch:  39 mm  Descending Thoracic Aorta:  30 mm  Sinus of Valsalva Measurements:  Non-coronary:  39 mm  Right -coronary:  36.9 mm  Left -coronary:  37.5 mm  Coronary Artery Height above Annulus:  Left Main:  15.2 mm above annulus  Right Coronary:  20.5 mm above annulus  Virtual Basal Annulus Measurements:  Maximum/Minimum Diameter:  28.2 mm x 19.6 mm  elliptical  Perimeter:  81.9 mm  Area:  458 mm2  Coronary Arteries:  Sufficient height above annulus for deployment  Optimum Fluoroscopic Angle for Delivery: LAO 2 degrees Caudal 23 degrees  IMPRESSION: 1) Severely calcified tri leaflet aortic valve with annular area 458 mm2 perimeter 81.9 mm. Suitable for a 26 mm Sapien 3 valve Although diameters smaller and annulus elliptical sinus and perimeter  Measurements suggest may be suitable for a 34 mm Evolut R valve  2) Moderate ascending aortic root dilatation  46 mm with marked angulation of the mid arch and marked tortuosity of the descending thoracic aorta which may make delivery challenging  3) Sufficient coronary height for deployment  4) Optimum angle for deployment LAO 2 degrees Caudal 23 degrees  5) No LAA thrombus  Charlton Haws   Electronically Signed   By: Charlton Haws M.D.   On: 10/25/2016 13:29   CT ANGIOGRAPHY CHEST, ABDOMEN AND PELVIS  TECHNIQUE: Multidetector CT imaging through the chest, abdomen and pelvis was performed using the standard protocol during bolus administration of intravenous contrast. Multiplanar reconstructed images and MIPs were obtained and reviewed to evaluate the vascular anatomy.  CONTRAST:  100 mL of Isovue 370.  COMPARISON:  None.  FINDINGS: CTA CHEST FINDINGS  Cardiovascular: Heart size is borderline enlarged. There is no significant pericardial fluid, thickening or pericardial calcification. There is aortic atherosclerosis, as well as atherosclerosis of the great vessels of the mediastinum and the coronary arteries, including calcified atherosclerotic plaque in the left anterior descending, left circumflex and right coronary arteries. Severe thickening calcification of the aortic valve. Mild aneurysmal dilatation of the ascending thoracic aorta which measures up to 4.6 cm in diameter. Distal aortic arch is also ectatic measuring 3.9 cm in  diameter.  Mediastinum/Lymph Nodes: No pathologically enlarged mediastinal or hilar lymph nodes. Esophagus is unremarkable in appearance. No axillary lymphadenopathy.  Lungs/Pleura: 4 mm subpleural nodule in the right middle lobe associated with the minor fissure (axial image 63 of series 7), statistically likely a benign subpleural lymph node. Areas of mild scarring in the lower lobes of lungs bilaterally. No acute consolidative airspace disease. No pleural effusions.  Musculoskeletal/Soft Tissues: Several old healed left-sided rib fractures. There are no aggressive appearing lytic or blastic lesions noted in the visualized portions of the skeleton.  CTA ABDOMEN AND PELVIS FINDINGS  Hepatobiliary: Again noted are multiple low-attenuation lesions scattered throughout the liver, similar in size, number and distribution to the prior study. The largest of these lesions are compatible with simple cysts, measuring up to 2 cm in diameter in segment 4B. The smaller lesions are too small to definitively characterize, but are presumably small cysts or biliary hamartomas. No intra or extrahepatic biliary ductal dilatation. Gallbladder is normal in appearance.  Pancreas: No pancreatic mass. No pancreatic ductal dilatation. No pancreatic or peripancreatic fluid or inflammatory changes.  Spleen: Unremarkable.  Adrenals/Urinary Tract: Subcentimeter low-attenuation lesion in the posterior aspect of the interpolar region of the right kidney is too small to definitively characterize, but similar to the prior study, likely a tiny cyst. Left kidney and bilateral adrenal glands are normal in appearance. No hydroureteronephrosis. Urinary bladder is normal in appearance.  Stomach/Bowel: The appearance of the stomach is normal. No pathologic dilatation of small bowel or colon. Diffuse mural thickening extending from the rectum to the hepatic flexure of the colon, which could indicate  underlying colitis. Normal appendix.  Vascular/Lymphatic: Aortic atherosclerosis, with vascular findings and measurements pertinent to potential TAVR procedure, as detailed below. Aneurysmal dilatation of the left common iliac artery which measures up to 21 mm in diameter. 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 Inferior mesenteric artery is widely patent. Two renal arteries are present bilaterally. On the right, there is  a very tiny branch extending to the lower pole and a dominant main right renal artery. On the left, there are 2 equal sized renal artery branches. All of these renal arteries appear widely patent without hemodynamically significant stenosis. No lymphadenopathy noted in the abdomen or pelvis.  Reproductive: Prostate gland and seminal vesicles are unremarkable in appearance.  Other: No significant volume of ascites.  No pneumoperitoneum.  Musculoskeletal: Multiple old healed fractures in the pelvis, including the left superior and inferior pubic rami, and an old fracture of the right ilium extending into the right acetabulum. There are no aggressive appearing lytic or blastic lesions noted in the visualized portions of the skeleton.  VASCULAR MEASUREMENTS PERTINENT TO TAVR:  AORTA:  Minimal Aortic Diameter -  19 x 21 mm  Severity of Aortic Calcification - mild to moderate  RIGHT PELVIS:  Right Common Iliac Artery -  Minimal Diameter - 13.6 x 12.3 mm  Tortuosity - moderate  Calcification - mild  Right External Iliac Artery -  Minimal Diameter - 11.4 x 11.6 mm  Tortuosity - severe  Calcification - none  Right Common Femoral Artery -  Minimal  Diameter - 12.2 x 12.3 mm  Tortuosity - mild  Calcification - none  LEFT PELVIS:  Left Common Iliac Artery -  Minimal Diameter - 18.3 x 13.2 mm  Tortuosity - severe  Calcification - moderate  Left External Iliac Artery -  Minimal Diameter - 12.2 x 11.9 mm  Tortuosity - moderate to severe  Calcification - mild  Left Common Femoral Artery -  Minimal Diameter - 13.8 x 12.8 mm  Tortuosity - mild  Calcification - mild  Review of the MIP images confirms the above findings.  IMPRESSION: 1. Vascular findings and measurements pertinent to potential T AVR procedure, as detailed above. This patient does appear to have suitable pelvic arterial access bilaterally. 2. Severe thickening calcification of the aortic valve, compatible with the reported clinical history of severe aortic stenosis. 3. Aortic atherosclerosis, in addition to three-vessel coronary artery disease. Please note that although the presence of coronary artery calcium documents the presence of coronary artery disease, the severity of this disease and any potential stenosis cannot be assessed on this non-gated CT examination. Assessment for potential risk factor modification, dietary therapy or pharmacologic therapy may be warranted, if clinically indicated. 4. 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 Recommend semi-annual imaging followup by CTA or MRA and referral to cardiothoracic surgery if not already obtained. This recommendation follows 2010 ACCF/AHA/AATS/ACR/ASA/SCA/SCAI/SIR/STS/SVM Guidelines for the Diagnosis and Management of Patients With Thoracic Aortic Disease. Circulation. 2010; 121: Z610-R604. 5. Mild diffuse rectal and colonic wall thickening extending from the rectum to the level of the hepatic flexure of the  colon, suggesting underlying colitis. 6. Additional incidental findings, as above. Aortic Atherosclerosis (ICD10-I70.0) and Aortic aneurysm NOS (ICD10-I71.9).   Electronically Signed   By: Trudie Reed M.D.   On: 10/25/2016 12:28   Impression:  Patient has stage CI severe asymptomatic aortic stenosis.  He continues to deny any symptoms of exertional shortness of breath, chest discomfort, or dizzy spells. He does admit that he has been "slowing down" recently. I have personally reviewed the patient's recent exercise stress echocardiography and cardiopulmonary stress test. Echocardiogram at baseline revealed severely thickened aortic valve leaflets with restricted leaflet mobility and peak velocity across the aortic valve  measuring between 4.0 and 4.2 m/s corresponding to mean transvalvular gradient of at least 39 mmHg.  With exercise the patient's peak velocity across the aortic valve increased to greater than 5.0 m/s corresponding to mean transvalvular gradient estimated 49 mmHg. Left ventricular systolic function remains normal with ejection fraction estimated 65-70%.  CPX testing revealed findings suggestive of mild circulatory limitation.  Cardiac-gated CTA of the heart reveals anatomical characteristics consistent with aortic stenosis suitable for treatment by transcatheter aortic valve replacement without any significant complicating features and CTA of the aorta and iliac vessels demonstrate what appears to be adequate pelvic vascular access to facilitate a transfemoral approach.  The patient would likely be good candidate for transcatheter aortic valve replacement using percutaneous transfemoral access, although diagnostic cardiac catheterization has not been performed.   Plan:  I discussed the results of the patient's stress tests and CT angiogram at length with the patient and his wife in the office today.  We discussed treatment options including continued close observation on  medical therapy versus proceeding with diagnostic cardiac catheterization and possible transcatheter aortic valve replacement in the near future. The patient does not wish to consider any type of surgical intervention in the immediate future.  He wants to discuss treatment options further with Roxanne Mins before making any definitive plans. I cautioned the patient to a should be careful not to take part in any particular strenuous physical exertion and to avoid getting overheated or dehydrated.  We discussed symptoms to be concerned about including the development of shortness of breath, chest discomfort, dizzy spells, or progressive fatigue. All of his questions have been addressed. We will plan to have the patient return in approximately 2 months with repeat follow-up echocardiogram to see how he is getting along. He will call and return sooner should he develop symptoms and/or decide to proceed with further diagnostic testing and possible transcatheter aortic valve replacement.   I spent in excess of 15 minutes during the conduct of this office consultation and >50% of this time involved direct face-to-face encounter with the patient for counseling and/or coordination of their care.    Salvatore Decent. Cornelius Moras, MD 11/05/2016 2:25 PM

## 2017-01-07 ENCOUNTER — Encounter: Payer: Self-pay | Admitting: Thoracic Surgery (Cardiothoracic Vascular Surgery)

## 2017-01-07 ENCOUNTER — Other Ambulatory Visit: Payer: Self-pay

## 2017-01-07 ENCOUNTER — Ambulatory Visit (INDEPENDENT_AMBULATORY_CARE_PROVIDER_SITE_OTHER): Payer: Medicare HMO | Admitting: Thoracic Surgery (Cardiothoracic Vascular Surgery)

## 2017-01-07 ENCOUNTER — Ambulatory Visit (HOSPITAL_COMMUNITY): Payer: Medicare HMO | Attending: Cardiology

## 2017-01-07 VITALS — BP 102/68 | HR 78 | Resp 16 | Ht 76.0 in | Wt 177.0 lb

## 2017-01-07 DIAGNOSIS — I088 Other rheumatic multiple valve diseases: Secondary | ICD-10-CM | POA: Insufficient documentation

## 2017-01-07 DIAGNOSIS — I350 Nonrheumatic aortic (valve) stenosis: Secondary | ICD-10-CM

## 2017-01-07 DIAGNOSIS — I5030 Unspecified diastolic (congestive) heart failure: Secondary | ICD-10-CM | POA: Diagnosis not present

## 2017-01-07 DIAGNOSIS — I420 Dilated cardiomyopathy: Secondary | ICD-10-CM | POA: Insufficient documentation

## 2017-01-07 NOTE — Patient Instructions (Signed)
Continue all previous medications without any changes at this time  

## 2017-01-07 NOTE — H&P (View-Only) (Signed)
[facility]   MULTIDISCIPLINARY HEART VALVE CLINIC       CARDIOTHORACIC SURGERY NOTE  Referring Provider is [name], PA-C PCP is [name], PA-C   HPI:  Patient is an 83 year old African-American male with history of severe aortic stenosis, hypertension, hyperlipidemia, peripheral vascular disease, and hypothyroidism who returns to the office today for follow-up of severe aortic stenosis.  He was originally seen in consultation on October 08, 2016.  At that time the patient remained remarkably active for a gentleman his age and reported absolutely no symptoms of exertional shortness of breath or chest discomfort.  He underwent formal cardiopulmonary exercise testing that revealed remarkably mild circulatory impairment in his exercise tolerance.  Stress echocardiography confirmed the presence of severe aortic stenosis with peak velocity across the aortic valve increased from 4.2 m/s at rest to greater than 5 m/s with exercise.  Left ventricular function was normal and the patient did not experience significant decline in blood pressure with exercise.  At that time the patient remains somewhat reluctant to consider any type of elective surgical intervention.  He was referred for dental service consultation and underwent uncomplicated dental extraction by Dr. Robin Searing.  He was last seen in follow-up here on November 05, 2016.  Since then he has been seen in follow-up by Arnette Felts and he returns to our office today for follow-up.  He underwent repeat transthoracic echocardiogram earlier today demonstrating further progression and severity of disease with peak velocity across the aortic valve at rest measured 5 m/s corresponding to a mean transvalvular gradient estimated 61 mmHg.  Left ventricular systolic function remain normal and the DVI was reported 0.24.  The patient reports that he continues to do very well.  He specifically denies any symptoms of exertional shortness  of breath or chest discomfort, although he states that he has been slowing down a bit recently.  He remains remarkably active physically and has not had any significant problems or limitations, but he does feel like he may be slowing down a bit.  He has not had any dizzy spells or syncope.   Current Outpatient Medications  Medication Sig Dispense Refill  . aspirin EC 81 MG tablet Take 81 mg by mouth daily.     Marland Kitchen levothyroxine (SYNTHROID, LEVOTHROID) 50 MCG tablet Take 50 mcg by mouth daily before breakfast.     . simvastatin (ZOCOR) 40 MG tablet Take 40 mg by mouth daily at 6 PM.     . tamsulosin (FLOMAX) 0.4 MG CAPS capsule Take 0.4 mg by mouth daily after supper.      No current facility-administered medications for this visit.       Physical Exam:   BP 102/68 (BP Location: Right Arm, Patient Position: Sitting, Cuff Size: Large)   Pulse 78   Resp 16   Ht 6\' 4"  (1.93 m)   Wt 177 lb 0.5 oz (80.3 kg)   SpO2 94% Comment: ON RA  BMI 21.55 kg/m    General:  Elderly but well-appearing  Chest:   Clear to auscultation  CV:   Regular rate and rhythm with harsh systolic murmur  Incisions:  n/a  Abdomen:  Soft nontender  Extremities:  Warm and well perfused  Diagnostic Tests:  Stress Echocardiography  Patient:    Johnpatrick, Jenny MR #:       [id number] Study Date: 10/22/2016 Gender:     M Age:        83 Height:  193 cm Weight:     80 kg BSA:        2.06 m^2 Pt. Status: Room:   ATTENDING    Tressie Stalker, M.D.  ORDERING     Tressie Stalker, M.D.  REFERRING    Tressie Stalker, M.D.  SONOGRAPHER  Perley Jain, RDCS  PERFORMING   [facility]  SONOGRAPHER  Sheralyn Boatman  cc:  -------------------------------------------------------------------  ------------------------------------------------------------------- Indications:      Aortic stenosis 424.1.  ------------------------------------------------------------------- History:   PMH:   Aortic valve  disease.  ------------------------------------------------------------------- Study Conclusions  - Aortic valve: Valve area (VTI): 1.64 cm^2. Valve area (Vmax):   1.86 cm^2. Valve area (Vmean): 1.49 cm^2. - Stress ECG conclusions: There were no stress arrhythmias or   conduction abnormalities. The stress ECG was negative for   ischemia. - Staged echo: There was no echocardiographic evidence for   stress-induced ischemia.  Impressions:  - Baseline LVEF 65-70%, transaortic gradients peak/mean 66/39 mmHg.   With exercise (stationary bike) peak/mean gradients 103/58 mmHg   consistent with critical aortic stenosis.   Aortic valve is severely thickened and calcified with severely   restricted leaflet opening.  ------------------------------------------------------------------- Study data:   Study status:  Routine.  Consent:  The risks, benefits, and alternatives to the procedure were explained to the patient and informed consent was obtained.  Procedure:  Study was performed on a stationary bike in conjunction with a CPX test to evaluate aortic stenosis severity. Initial setup. The patient was brought to the laboratory. A baseline ECG was recorded. Surface ECG leads and automatic cuff blood pressure measurements were monitored. Exercise testing was performed. Transthoracic stress echocardiography. Images were captured at baseline and peak exercise.  Study completion:  The patient tolerated the procedure well. There were no complications.          Modified Bruce protocol. Stress echocardiography.  Birthdate:  Patient birthdate: August 26, 1924.  Age:  Patient is 83 yr old.  Sex:  Gender: male. BMI: 21.5 kg/m^2.  Patient status:  Outpatient.  Study date:  Study date: 10/22/2016. Study time: 11:03 AM.  -------------------------------------------------------------------  ------------------------------------------------------------------- Aortic valve:   Doppler:     VTI ratio of LVOT  to aortic valve: 0.39. Valve area (VTI): 1.64 cm^2. Indexed valve area (VTI): 0.79 cm^2/m^2. Peak velocity ratio of LVOT to aortic valve: 0.45. Valve area (Vmax): 1.86 cm^2. Indexed valve area (Vmax): 0.9 cm^2/m^2. Mean velocity ratio of LVOT to aortic valve: 0.36. Valve area (Vmean): 1.49 cm^2. Indexed valve area (Vmean): 0.72 cm^2/m^2. Mean gradient (S): 49 mm Hg. Peak gradient (S): 102 mm Hg.   ------------------------------------------------------------------- Baseline ECG:  Normal.  ------------------------------------------------------------------- Stress protocol:  +--------+--------+ !Stage   !Symptoms! +--------+--------+ !Baseline!None    ! +--------+--------+  ------------------------------------------------------------------- Stress results:   The target heart rate was achieved. The heart rate response to stress was normal.   There was a normal resting blood pressure with an appropriate response to stress.  The patient experienced no chest pain during stress.  ------------------------------------------------------------------- Stress ECG:  There were no stress arrhythmias or conduction abnormalities.  The stress ECG was negative for ischemia.   ------------------------------------------------------------------- Stress echo results:     Left ventricular ejection fraction was normal at rest and with stress. There was no echocardiographic evidence for stress-induced ischemia.  ------------------------------------------------------------------- Measurements   Left ventricle                           Value  Stroke volume, 2D  133   ml  Stroke volume/bsa, 2D                    64    ml/m^2    LVOT                                     Value  LVOT ID, S                               23    mm  LVOT area                                4.15  cm^2  LVOT peak velocity, S                    227   cm/s  LVOT mean velocity, S                    116    cm/s  LVOT VTI, S                              32    cm  LVOT peak gradient, S                    21    mm Hg    Aortic valve                             Value  Aortic valve peak velocity, S            506   cm/s  Aortic valve mean velocity, S            324   cm/s  Aortic valve VTI, S                      81.1  cm  Aortic mean gradient, S                  49    mm Hg  Aortic peak gradient, S                  102   mm Hg  VTI ratio, LVOT/AV                       0.39  Aortic valve area, VTI                   1.64  cm^2  Aortic valve area/bsa, VTI               0.79  cm^2/m^2  Velocity ratio, peak, LVOT/AV            0.45  Aortic valve area, peak velocity         1.86  cm^2  Aortic valve area/bsa, peak velocity     0.9   cm^2/m^2  Velocity ratio, mean, LVOT/AV            0.36  Aortic valve area, mean velocity         1.49  cm^2  Aortic valve area/bsa, mean velocity  0.72  cm^2/m^2  Legend: (L)  and  (H)  mark values outside specified reference range.  ------------------------------------------------------------------- Prepared and Electronically Authenticated by  Tobias Alexander, M.D. 2018-08-27T14:12:08    Referred for: Assessment of functional capacity   Procedure: This patient underwent staged symptom-limited exercise testing using a individualized bike protocol with expired gas analysis metabolic evaluation during exercise.  Demographics  Age: 44 Ht. (in.) 76 Wt. (lb) 177.4 BMI: 21.6   Predicted Peak VO2: 17.6  Gender: Male Ht (cm) 193 Wt. (kg) 80.5    Results  Pre-Exercise PFTs   FVC 3.56 (95%)     FEV1 2.70 (101%)      FEV1/FVC 76 (103%)      MVV 88 (71%)      Exercise Time:  8:00   Watts: 70  RPE: 17  Reason stopped: Patient ended test due to leg fatigue and difficulty maintaining pedal contact on cycle.   Additional symptoms: Dyspnea (4/10)  Resting HR: 69 Peak HR: 113  (86% age predicted max HR)  BP rest: 106/64 BP  peak: 150/78  Peak VO2: 16.1 (91% predicted peak VO2)  VE/VCO2 slope: 44  OUES: 1.45  Peak RER: 1.17  Ventilatory Threshold: 13.7 (78% predicted or measured peak VO2)  Peak RR 32  Peak Ventilation: 60.8  VE/MVV: 69%  PETCO2 at peak: 25  O2pulse: 12  (109% predicted O2pulse)   Interpretation  Notes: Patient gave a very good effort. Pulse-oximetry remained within 95-99% for the duration of exercise. Exercise was performed on a cycle ergometer starting at [facility] and increasing by 10W/min. Rest and immediate post-exercise echo were performed.   ECG: Resting ECG in normal sinus rhythm with left axis deviation and previous infarct. HR Response appropriate. There were no sustained arrhythmias and no ST-T changes. BP response appropriate.   PFT: Pre-exercise spirometry was within normal limits. The MVV was normal.   CPX: Exercise testing with gas exchange demonstrates a normal peak VO2 of 16.1 ml/kg/min (91% of the age/gender/weight matched sedentary norms). The RER of 1.17 indicates a maximal effort. The VE/VCO2 slope is significantly elevated and indicates excessive dead space ventilation. The oxygen uptake efficiency slope (OUES) is normal. The VO2 at the ventilatory threshold was normal at 78% of the predicted peak VO2. At peak exercise, the ventilation reached 69% of the measured MVV indicating ventilatory limits were beginning to approach. The O2pulse (a surrogate for stroke volume) increased with incremental exercise, reaching peak at 12 ml/beat (109%).   Conclusion: Exercise testing with gas exchange demonstrates a normal functional capacity when compared to matched sedentary norms. There is no indication for pulmonary limitations. Although PVO2 within normal range, patient is likely moderately circulatory limited with significantly elevated VE/VCO2 slope, which indicates excessive dead space ventilation.   Test, report and preliminary impression by: Lesia Hausen, MS,  ACSM-RCEP 10/22/2016 3:02 PM  Agree with above. Probable mild circulatory limitation. Markedly elevated VeVCO2 suggests elevated pulmonary pressures during exercise.   Arvilla Meres, MD  6:39 PM   Cardiac TAVR CT  TECHNIQUE: The patient was scanned on a Siemens 192 slice scanner. A 120 kV retrospective scan was triggered in the ascending thoracic aorta at 140 HU's. Gantry rotation speed was 250 msecs and collimation was .6 mm. Lopressor 5 mg given the 3D data set was reconstructed in 5% intervals of the R-R cycle. Systolic and diastolic phases were analyzed on a dedicated work station using MPR, MIP and VRT modes. The patient received 80 cc of contrast.  FINDINGS: Aortic Valve: Tri  leaflet severely calcified valve with restricted leaflet motion  Aorta: Moderate aortic root dilatation. Significant angulation of the mid aortic arch after the take off of the left subclavian. Marked tortuosity of the descending thoracic aorta  Sinotubular Junction:  34.6 mm  Ascending Thoracic Aorta:  46 mm  Aortic Arch:  39 mm  Descending Thoracic Aorta:  30 mm  Sinus of Valsalva Measurements:  Non-coronary:  39 mm  Right -coronary:  36.9 mm  Left -coronary:  37.5 mm  Coronary Artery Height above Annulus:  Left Main:  15.2 mm above annulus  Right Coronary:  20.5 mm above annulus  Virtual Basal Annulus Measurements:  Maximum/Minimum Diameter:  28.2 mm x 19.6 mm elliptical  Perimeter:  81.9 mm  Area:  458 mm2  Coronary Arteries:  Sufficient height above annulus for deployment  Optimum Fluoroscopic Angle for Delivery: LAO 2 degrees Caudal 23 degrees  IMPRESSION: 1) Severely calcified tri leaflet aortic valve with annular area 458 mm2 perimeter 81.9 mm. Suitable for a 26 mm Sapien 3 valve Although diameters smaller and annulus elliptical sinus and perimeter  Measurements suggest may be suitable for a 34 mm Evolut R valve  2) Moderate ascending  aortic root dilatation 46 mm with marked angulation of the mid arch and marked tortuosity of the descending thoracic aorta which may make delivery challenging  3) Sufficient coronary height for deployment  4) Optimum angle for deployment LAO 2 degrees Caudal 23 degrees  5) No LAA thrombus  Charlton Haws   Electronically Signed   By: Charlton Haws M.D.   On: 10/25/2016 13:29    CT ANGIOGRAPHY CHEST, ABDOMEN AND PELVIS  TECHNIQUE: Multidetector CT imaging through the chest, abdomen and pelvis was performed using the standard protocol during bolus administration of intravenous contrast. Multiplanar reconstructed images and MIPs were obtained and reviewed to evaluate the vascular anatomy.  CONTRAST:  100 mL of Isovue 370.  COMPARISON:  None.  FINDINGS: CTA CHEST FINDINGS  Cardiovascular: Heart size is borderline enlarged. There is no significant pericardial fluid, thickening or pericardial calcification. There is aortic atherosclerosis, as well as atherosclerosis of the great vessels of the mediastinum and the coronary arteries, including calcified atherosclerotic plaque in the left anterior descending, left circumflex and right coronary arteries. Severe thickening calcification of the aortic valve. Mild aneurysmal dilatation of the ascending thoracic aorta which measures up to 4.6 cm in diameter. Distal aortic arch is also ectatic measuring 3.9 cm in diameter.  Mediastinum/Lymph Nodes: No pathologically enlarged mediastinal or hilar lymph nodes. Esophagus is unremarkable in appearance. No axillary lymphadenopathy.  Lungs/Pleura: 4 mm subpleural nodule in the right middle lobe associated with the minor fissure (axial image 63 of series 7), statistically likely a benign subpleural lymph node. Areas of mild scarring in the lower lobes of lungs bilaterally. No acute consolidative airspace disease. No pleural effusions.  Musculoskeletal/Soft Tissues:  Several old healed left-sided rib fractures. There are no aggressive appearing lytic or blastic lesions noted in the visualized portions of the skeleton.  CTA ABDOMEN AND PELVIS FINDINGS  Hepatobiliary: Again noted are multiple low-attenuation lesions scattered throughout the liver, similar in size, number and distribution to the prior study. The largest of these lesions are compatible with simple cysts, measuring up to 2 cm in diameter in segment 4B. The smaller lesions are too small to definitively characterize, but are presumably small cysts or biliary hamartomas. No intra or extrahepatic biliary ductal dilatation. Gallbladder is normal in appearance.  Pancreas: No pancreatic mass. No pancreatic ductal  dilatation. No pancreatic or peripancreatic fluid or inflammatory changes.  Spleen: Unremarkable.  Adrenals/Urinary Tract: Subcentimeter low-attenuation lesion in the posterior aspect of the interpolar region of the right kidney is too small to definitively characterize, but similar to the prior study, likely a tiny cyst. Left kidney and bilateral adrenal glands are normal in appearance. No hydroureteronephrosis. Urinary bladder is normal in appearance.  Stomach/Bowel: The appearance of the stomach is normal. No pathologic dilatation of small bowel or colon. Diffuse mural thickening extending from the rectum to the hepatic flexure of the colon, which could indicate underlying colitis. Normal appendix.  Vascular/Lymphatic: Aortic atherosclerosis, with vascular findings and measurements pertinent to potential TAVR procedure, as detailed below. Aneurysmal dilatation of the left common iliac artery which measures up to 21 mm in diameter. 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 In the proximal superior mesenteric artery there is a focal area of aneurysmal dilatation with short segment dissection measuring up to 9 x 12 mm (axial  image 116 of series 6), very similar to prior CT of the abdomen and pelvis 03/25/2015. Inferior mesenteric artery is widely patent. Two renal arteries are present bilaterally. On the right, there is a very tiny branch extending to the lower pole and a dominant main right renal artery. On the left, there are 2 equal sized renal artery branches. All of these renal arteries appear widely patent without hemodynamically significant stenosis. No lymphadenopathy noted in the abdomen or pelvis.  Reproductive: Prostate gland and seminal vesicles are unremarkable in appearance.  Other: No significant volume of ascites.  No pneumoperitoneum.  Musculoskeletal: Multiple old healed fractures in the pelvis, including the left superior and inferior pubic rami, and an old fracture of the right ilium extending into the right acetabulum. There are no aggressive appearing lytic or blastic lesions noted in the visualized portions of the skeleton.  VASCULAR MEASUREMENTS PERTINENT TO TAVR:  AORTA:  Minimal Aortic Diameter -  19 x 21 mm  Severity of Aortic Calcification - mild to moderate  RIGHT PELVIS:  Right Common Iliac Artery -  Minimal Diameter - 13.6 x 12.3 mm  Tortuosity - moderate  Calcification - mild  Right External Iliac Artery -  Minimal Diameter - 11.4 x 11.6 mm  Tortuosity - severe  Calcification - none  Right Common Femoral Artery -  Minimal Diameter - 12.2 x 12.3 mm  Tortuosity - mild  Calcification - none  LEFT PELVIS:  Left Common Iliac Artery -  Minimal Diameter - 18.3 x 13.2 mm  Tortuosity - severe  Calcification - moderate  Left External Iliac Artery -  Minimal Diameter - 12.2 x 11.9 mm  Tortuosity - moderate to severe  Calcification - mild  Left Common Femoral Artery -  Minimal Diameter - 13.8 x 12.8 mm  Tortuosity - mild  Calcification - mild  Review of the MIP images confirms the above  findings.  IMPRESSION: 1. Vascular findings and measurements pertinent to potential T AVR procedure, as detailed above. This patient does appear to have suitable pelvic arterial access bilaterally. 2. Severe thickening calcification of the aortic valve, compatible with the reported clinical history of severe aortic stenosis. 3. Aortic atherosclerosis, in addition to three-vessel coronary artery disease. Please note that although the presence of coronary artery calcium documents the presence of coronary artery disease, the severity of this disease and any potential stenosis cannot be assessed on this non-gated CT examination. Assessment for potential risk factor modification, dietary therapy or  pharmacologic therapy may be warranted, if clinically indicated. 4. 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 Recommend semi-annual imaging followup by CTA or MRA and referral to cardiothoracic surgery if not already obtained. This recommendation follows 2010 ACCF/AHA/AATS/ACR/ASA/SCA/SCAI/SIR/STS/SVM Guidelines for the Diagnosis and Management of Patients With Thoracic Aortic Disease. Circulation. 2010; 121: W098-J191. 5. Mild diffuse rectal and colonic wall thickening extending from the rectum to the level of the hepatic flexure of the colon, suggesting underlying colitis. 6. Additional incidental findings, as above. Aortic Atherosclerosis (ICD10-I70.0) and Aortic aneurysm NOS (ICD10-I71.9).   Electronically Signed   By: Trudie Reed M.D.   On: 10/25/2016 12:28    Echocardiography  Patient:    Torrey, Ballinas MR #:       [id number] Study Date: 01/07/2017 Gender:     M Age:        89 Height:     193 cm Weight:     80.3 kg BSA:        2.07 m^2 Pt. Status: Room:   ATTENDING    Tressie Stalker, M.D.  ORDERING      Tressie Stalker, M.D.  REFERRING    Tressie Stalker, M.D.  SONOGRAPHER  Dewitt Hoes, RDCS  PERFORMING   [facility] Out[facility]  cc:  ------------------------------------------------------------------- LV EF: 65% -   70%  ------------------------------------------------------------------- Indications:      Aortic stenosis (I35).  ------------------------------------------------------------------- History:   PMH:   Coronary artery disease.  Aortic valve disease. Risk factors:  PAD. Pulmonary hypertension. Hypertension. Dyslipidemia.  ------------------------------------------------------------------- Study Conclusions  - Left ventricle: The cavity size was normal. There was moderate   concentric hypertrophy. Systolic function was vigorous. The   estimated ejection fraction was in the range of 65% to 70%. Wall   motion was normal; there were no regional wall motion   abnormalities. Doppler parameters are consistent with abnormal   left ventricular relaxation (grade 1 diastolic dysfunction).   Doppler parameters are consistent with elevated ventricular   end-diastolic filling pressure. - Aortic valve: There was mild regurgitation. Mean gradient (S): 66   mm Hg. Peak gradient (S): 110 mm Hg. - Ascending aorta: The ascending aorta was moderately dilated   measuring 46 mm. - Right ventricle: Systolic function was normal. - Right atrium: The atrium was moderately dilated. - Tricuspid valve: There was moderate regurgitation. - Pulmonic valve: There was mild regurgitation. - Pulmonary arteries: Systolic pressure was moderately increased.   PA peak pressure: 56 mm Hg (S). - Inferior vena cava: The vessel was normal in size. - Pericardium, extracardiac: There was no pericardial effusion.  Impressions:  - Hyperdynamic LVEF. Critical aortic stenosis.  ------------------------------------------------------------------- Labs, prior tests, procedures, and surgery: Stress  echocardiography (10/22/2016).  ------------------------------------------------------------------- Study data:  Comparison was made to the study of 10/22/2016.  Study status:  Routine.  Procedure:  The patient reported no pain pre or post test. Transthoracic echocardiography. Image quality was adequate.  Study completion:  There were no complications. Echocardiography.  M-mode, complete 2D, 3D, spectral Doppler, and color Doppler.  Birthdate:  Patient birthdate: [birth year].  Age: Patient is 83 yr old.  Sex:  Gender: male.    BMI: 21.6 kg/m^2. Blood pressure:     115/73  Patient status:  Outpatient.  Study date:  Study date: 01/07/2017. Study time: 10:40 AM.  Location: [facility]  -------------------------------------------------------------------  ------------------------------------------------------------------- Left  ventricle:  The cavity size was normal. There was moderate concentric hypertrophy. Systolic function was vigorous. The estimated ejection fraction was in the range of 65% to 70%. Wall motion was normal; there were no regional wall motion abnormalities. Doppler parameters are consistent with abnormal left ventricular relaxation (grade 1 diastolic dysfunction). Doppler parameters are consistent with elevated ventricular end-diastolic filling pressure.  ------------------------------------------------------------------- Aortic valve:   Trileaflet; severely thickened, severely calcified leaflets. Mobility was not restricted.  Doppler:  Transvalvular velocity was within the normal range. There was no stenosis. There was mild regurgitation.    VTI ratio of LVOT to aortic valve: 0.3. Valve area (VTI): 0.94 cm^2. Indexed valve area (VTI): 0.45 cm^2/m^2. Peak velocity ratio of LVOT to aortic valve: 0.24. Valve area (Vmax): 0.77 cm^2. Indexed valve area (Vmax): 0.37 cm^2/m^2. Mean velocity ratio of LVOT to aortic valve: 0.27. Valve area (Vmean): 0.86 cm^2. Indexed  valve area (Vmean): 0.42 cm^2/m^2. Mean gradient (S): 66 mm Hg. Peak gradient (S): 110 mm Hg.  ------------------------------------------------------------------- Aorta:  Aortic root: The aortic root was normal in size. 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  ------------------------------------------------------------------- Mitral valve:   Structurally normal valve.   Mobility was not restricted.  Doppler:  Transvalvular velocity was within the normal range. There was no evidence for stenosis. There was no regurgitation.    Peak gradient (D): 2 mm Hg.  ------------------------------------------------------------------- Left atrium:  The atrium was normal in size.  ------------------------------------------------------------------- Right ventricle:  The cavity size was normal. Wall thickness was normal. Systolic function was normal.  ------------------------------------------------------------------- Pulmonic valve:    Structurally normal valve.   Cusp separation was normal.  Doppler:  Transvalvular velocity was within the normal range. There was no evidence for stenosis. There was mild regurgitation.  ------------------------------------------------------------------- Tricuspid valve:   Structurally normal valve.    Doppler: Transvalvular velocity was within the normal range. There was moderate regurgitation.  ------------------------------------------------------------------- Pulmonary artery:   The main pulmonary artery was normal-sized. Systolic pressure was moderately increased.  ------------------------------------------------------------------- Right atrium:  The atrium was moderately dilated.  ------------------------------------------------------------------- Pericardium:  There was no pericardial effusion.  ------------------------------------------------------------------- Systemic veins: Inferior vena cava: The  vessel was normal in size.  ------------------------------------------------------------------- Measurements   Left ventricle                            Value          Reference  LV ID, ED, PLAX chordal           (L)     38    mm       43 - 52  LV ID, ES, PLAX chordal                   27.1  mm       23 - 38  LV fx shortening, PLAX chordal            29    %        >=29  LV PW thickness, ED                       11    mm       ---------  IVS/LV PW ratio, ED               (H)     1.45           <=1.3  Stroke volume, 2D                         101   ml       ---------  Stroke volume/bsa, 2D                     49    ml/m^2   ---------  LV e&', lateral                            4.35  cm/s     ---------  LV E/e&', lateral                          17.61          ---------  LV e&', medial                             4.46  cm/s     ---------  LV E/e&', medial                           17.17          ---------  LV e&', average                            4.41  cm/s     ---------  LV E/e&', average                          17.39          ---------    Ventricular septum                        Value          Reference  IVS thickness, ED                         15.9  mm       ---------    LVOT                                      Value          Reference  LVOT ID, S                                20    mm       ---------  LVOT area                                 3.14  cm^2     ---------  LVOT peak velocity, S                     122   cm/s     ---------  LVOT mean velocity, S                     98.9  cm/s     ---------  LVOT VTI, S  32.3  cm       ---------  LVOT peak gradient, S                     6     mm Hg    ---------    Aortic valve                              Value          Reference  Aortic valve peak velocity, S             498   cm/s     ---------  Aortic valve mean velocity, S             360   cm/s     ---------  Aortic valve VTI, S                        108   cm       ---------  Aortic mean gradient, S                   61    mm Hg    ---------  Aortic peak gradient, S                   99    mm Hg    ---------  VTI ratio, LVOT/AV                        0.3            ---------  Aortic valve area, VTI                    0.94  cm^2     ---------  Aortic valve area/bsa, VTI                0.45  cm^2/m^2 ---------  Velocity ratio, peak, LVOT/AV             0.24           ---------  Aortic valve area, peak velocity          0.77  cm^2     ---------  Aortic valve area/bsa, peak               0.37  cm^2/m^2 ---------  velocity  Velocity ratio, mean, LVOT/AV             0.27           ---------  Aortic valve area, mean velocity          0.86  cm^2     ---------  Aortic valve area/bsa, mean               0.42  cm^2/m^2 ---------  velocity    Aorta                                     Value          Reference  Aortic root ID, ED                        40    mm       ---------  Ascending aorta ID, A-P, S  45    mm       ---------    Left atrium                               Value          Reference  LA ID, A-P, ES                            41    mm       ---------  LA ID/bsa, A-P                            1.98  cm/m^2   <=2.2  LA volume, S                              43.6  ml       ---------  LA volume/bsa, S                          21.1  ml/m^2   ---------  LA volume, ES, 1-p A4C                    26.6  ml       ---------  LA volume/bsa, ES, 1-p A4C                12.9  ml/m^2   ---------  LA volume, ES, 1-p A2C                    60.6  ml       ---------  LA volume/bsa, ES, 1-p A2C                29.3  ml/m^2   ---------    Mitral valve                              Value          Reference  Mitral E-wave peak velocity               76.6  cm/s     ---------  Mitral A-wave peak velocity               111   cm/s     ---------  Mitral deceleration time          (H)     327   ms       150 - 230  Mitral peak gradient, D                    2     mm Hg    ---------  Mitral E/A ratio, peak                    0.7            ---------    Pulmonary arteries                        Value          Reference  PA pressure, S, DP                (  H)     56    mm Hg    <=30    Tricuspid valve                           Value          Reference  Tricuspid regurg peak velocity            347   cm/s     ---------  Tricuspid peak RV-RA gradient             48    mm Hg    ---------    Systemic veins                            Value          Reference  Estimated CVP                             8     mm Hg    ---------    Right ventricle                           Value          Reference  TAPSE                                     31.3  mm       ---------  RV pressure, S, DP                (H)     56    mm Hg    <=30  RV s&', lateral, S                         15    cm/s     ---------    Pulmonic valve                            Value          Reference  Pulmonic regurg velocity, ED              147   cm/s     ---------  Pulmonic regurg gradient, ED              9     mm Hg    ---------  Legend: (L)  and  (H)  mark values outside specified reference range.  ------------------------------------------------------------------- Prepared and Electronically Authenticated by  Tobias Alexander, M.D. 2018-11-12T11:46:06      Impression:  Patient has at least stage C1 very severe asymptomatic aortic stenosis and probably early stage D symptomatic disease.  I have personally reviewed the patient's follow-up echocardiogram performed earlier today which reveals significant progression of disease with peak velocity across the aortic valve now measured 5 m/s at rest corresponding to a mean transvalvular gradient greater than 60 mmHg.  Left ventricular systolic function remains normal.  Cardiac-gated CTA of the heart reveals anatomical characteristics consistent with aortic stenosis suitable for treatment by transcatheter aortic valve  replacement without any significant complicating features and CTA of the aorta and iliac vessels demonstrate what appears to be adequate pelvic vascular access  to facilitate a transfemoral approach.  The patient would likely be good candidate for transcatheter aortic valve replacement using percutaneous transfemoral access, although diagnostic cardiac catheterization has not been performed.    Plan:  The patient and his wife were counseled at length regarding treatment alternatives for management of severe symptomatic aortic stenosis. Alternative approaches such as conventional aortic valve replacement, transcatheter aortic valve replacement, and continued medical therapy were compared and contrasted at length.  The risks associated with conventional surgical aortic valve replacement were been discussed in detail, as were expectations for post-operative convalescence, and why I would be reluctant to consider this patient a candidate for conventional surgery.  Issues specific to transcatheter aortic valve replacement were discussed including questions about long term valve durability, the potential for paravalvular leak, possible increased risk of need for permanent pacemaker placement, and other technical complications related to the procedure itself.  Long-term prognosis with medical therapy was discussed. This discussion was placed in the context of the patient's own specific clinical presentation and past medical history.  All of their questions been addressed.  The patient desires to proceed with transcatheter aortic valve replacement in the near future.  His next step he will need to undergo diagnostic cardiac catheterization to rule out the presence of significant coronary artery disease.  We tentatively plan to proceed with transcatheter aortic valve replacement on Tuesday, January 22, 2017.  Prior to surgery he will need to have another surgical consultation for a second opinion.  Following the  decision to proceed with transcatheter aortic valve replacement, a discussion has been held regarding what types of management strategies would be attempted intraoperatively in the event of life-threatening complications, including whether or not the patient would be considered a candidate for the use of cardiopulmonary bypass and/or conversion to open sternotomy for attempted surgical intervention.  The patient has been advised of a variety of complications that might develop including but not limited to risks of death, stroke, paravalvular leak, aortic dissection or other major vascular complications, aortic annulus rupture, device embolization, cardiac rupture or perforation, mitral regurgitation, acute myocardial infarction, arrhythmia, heart block or bradycardia requiring permanent pacemaker placement, congestive heart failure, respiratory failure, renal failure, pneumonia, infection, other late complications related to structural valve deterioration or migration, or other complications that might ultimately cause a temporary or permanent loss of functional independence or other long term morbidity.  The patient provides full informed consent for the procedure as described and all questions were answered.   I spent in excess of 15 minutes during the conduct of this office consultation and >50% of this time involved direct face-to-face encounter with the patient for counseling and/or coordination of their care.    Salvatore Decent. Cornelius Moras, MD 01/07/2017 5:23 PM

## 2017-01-07 NOTE — Progress Notes (Signed)
[facility]   MULTIDISCIPLINARY HEART VALVE CLINIC       CARDIOTHORACIC SURGERY NOTE  Referring Provider is [name], PA-C PCP is [name], PA-C   HPI:  Patient is an 83 year old African-American male with history of severe aortic stenosis, hypertension, hyperlipidemia, peripheral vascular disease, and hypothyroidism who returns to the office today for follow-up of severe aortic stenosis.  He was originally seen in consultation on October 08, 2016.  At that time the patient remained remarkably active for a gentleman his age and reported absolutely no symptoms of exertional shortness of breath or chest discomfort.  He underwent formal cardiopulmonary exercise testing that revealed remarkably mild circulatory impairment in his exercise tolerance.  Stress echocardiography confirmed the presence of severe aortic stenosis with peak velocity across the aortic valve increased from 4.2 m/s at rest to greater than 5 m/s with exercise.  Left ventricular function was normal and the patient did not experience significant decline in blood pressure with exercise.  At that time the patient remains somewhat reluctant to consider any type of elective surgical intervention.  He was referred for dental service consultation and underwent uncomplicated dental extraction by Dr. Robin Searing.  He was last seen in follow-up here on November 05, 2016.  Since then he has been seen in follow-up by Arnette Felts and he returns to our office today for follow-up.  He underwent repeat transthoracic echocardiogram earlier today demonstrating further progression and severity of disease with peak velocity across the aortic valve at rest measured 5 m/s corresponding to a mean transvalvular gradient estimated 61 mmHg.  Left ventricular systolic function remain normal and the DVI was reported 0.24.  The patient reports that he continues to do very well.  He specifically denies any symptoms of exertional shortness  of breath or chest discomfort, although he states that he has been slowing down a bit recently.  He remains remarkably active physically and has not had any significant problems or limitations, but he does feel like he may be slowing down a bit.  He has not had any dizzy spells or syncope.   Current Outpatient Medications  Medication Sig Dispense Refill  . aspirin EC 81 MG tablet Take 81 mg by mouth daily.     Marland Kitchen levothyroxine (SYNTHROID, LEVOTHROID) 50 MCG tablet Take 50 mcg by mouth daily before breakfast.     . simvastatin (ZOCOR) 40 MG tablet Take 40 mg by mouth daily at 6 PM.     . tamsulosin (FLOMAX) 0.4 MG CAPS capsule Take 0.4 mg by mouth daily after supper.      No current facility-administered medications for this visit.       Physical Exam:   BP 102/68 (BP Location: Right Arm, Patient Position: Sitting, Cuff Size: Large)   Pulse 78   Resp 16   Ht 6\' 4"  (1.93 m)   Wt 177 lb 0.5 oz (80.3 kg)   SpO2 94% Comment: ON RA  BMI 21.55 kg/m    General:  Elderly but well-appearing  Chest:   Clear to auscultation  CV:   Regular rate and rhythm with harsh systolic murmur  Incisions:  n/a  Abdomen:  Soft nontender  Extremities:  Warm and well perfused  Diagnostic Tests:  Stress Echocardiography  Patient:    Johnpatrick, Jenny MR #:       [id number] Study Date: 10/22/2016 Gender:     M Age:        89 Height:  193 cm Weight:     80 kg BSA:        2.06 m^2 Pt. Status: Room:   ATTENDING    Tressie Stalker, M.D.  ORDERING     Tressie Stalker, M.D.  REFERRING    Tressie Stalker, M.D.  SONOGRAPHER  Perley Jain, RDCS  PERFORMING   [facility]  SONOGRAPHER  Sheralyn Boatman  cc:  -------------------------------------------------------------------  ------------------------------------------------------------------- Indications:      Aortic stenosis 424.1.  ------------------------------------------------------------------- History:   PMH:   Aortic valve  disease.  ------------------------------------------------------------------- Study Conclusions  - Aortic valve: Valve area (VTI): 1.64 cm^2. Valve area (Vmax):   1.86 cm^2. Valve area (Vmean): 1.49 cm^2. - Stress ECG conclusions: There were no stress arrhythmias or   conduction abnormalities. The stress ECG was negative for   ischemia. - Staged echo: There was no echocardiographic evidence for   stress-induced ischemia.  Impressions:  - Baseline LVEF 65-70%, transaortic gradients peak/mean 66/39 mmHg.   With exercise (stationary bike) peak/mean gradients 103/58 mmHg   consistent with critical aortic stenosis.   Aortic valve is severely thickened and calcified with severely   restricted leaflet opening.  ------------------------------------------------------------------- Study data:   Study status:  Routine.  Consent:  The risks, benefits, and alternatives to the procedure were explained to the patient and informed consent was obtained.  Procedure:  Study was performed on a stationary bike in conjunction with a CPX test to evaluate aortic stenosis severity. Initial setup. The patient was brought to the laboratory. A baseline ECG was recorded. Surface ECG leads and automatic cuff blood pressure measurements were monitored. Exercise testing was performed. Transthoracic stress echocardiography. Images were captured at baseline and peak exercise.  Study completion:  The patient tolerated the procedure well. There were no complications.          Modified Bruce protocol. Stress echocardiography.  Birthdate:  Patient birthdate: August 26, 1924.  Age:  Patient is 83 yr old.  Sex:  Gender: male. BMI: 21.5 kg/m^2.  Patient status:  Outpatient.  Study date:  Study date: 10/22/2016. Study time: 11:03 AM.  -------------------------------------------------------------------  ------------------------------------------------------------------- Aortic valve:   Doppler:     VTI ratio of LVOT  to aortic valve: 0.39. Valve area (VTI): 1.64 cm^2. Indexed valve area (VTI): 0.79 cm^2/m^2. Peak velocity ratio of LVOT to aortic valve: 0.45. Valve area (Vmax): 1.86 cm^2. Indexed valve area (Vmax): 0.9 cm^2/m^2. Mean velocity ratio of LVOT to aortic valve: 0.36. Valve area (Vmean): 1.49 cm^2. Indexed valve area (Vmean): 0.72 cm^2/m^2. Mean gradient (S): 49 mm Hg. Peak gradient (S): 102 mm Hg.   ------------------------------------------------------------------- Baseline ECG:  Normal.  ------------------------------------------------------------------- Stress protocol:  +--------+--------+ !Stage   !Symptoms! +--------+--------+ !Baseline!None    ! +--------+--------+  ------------------------------------------------------------------- Stress results:   The target heart rate was achieved. The heart rate response to stress was normal.   There was a normal resting blood pressure with an appropriate response to stress.  The patient experienced no chest pain during stress.  ------------------------------------------------------------------- Stress ECG:  There were no stress arrhythmias or conduction abnormalities.  The stress ECG was negative for ischemia.   ------------------------------------------------------------------- Stress echo results:     Left ventricular ejection fraction was normal at rest and with stress. There was no echocardiographic evidence for stress-induced ischemia.  ------------------------------------------------------------------- Measurements   Left ventricle                           Value  Stroke volume, 2D  133   ml  Stroke volume/bsa, 2D                    64    ml/m^2    LVOT                                     Value  LVOT ID, S                               23    mm  LVOT area                                4.15  cm^2  LVOT peak velocity, S                    227   cm/s  LVOT mean velocity, S                    116    cm/s  LVOT VTI, S                              32    cm  LVOT peak gradient, S                    21    mm Hg    Aortic valve                             Value  Aortic valve peak velocity, S            506   cm/s  Aortic valve mean velocity, S            324   cm/s  Aortic valve VTI, S                      81.1  cm  Aortic mean gradient, S                  49    mm Hg  Aortic peak gradient, S                  102   mm Hg  VTI ratio, LVOT/AV                       0.39  Aortic valve area, VTI                   1.64  cm^2  Aortic valve area/bsa, VTI               0.79  cm^2/m^2  Velocity ratio, peak, LVOT/AV            0.45  Aortic valve area, peak velocity         1.86  cm^2  Aortic valve area/bsa, peak velocity     0.9   cm^2/m^2  Velocity ratio, mean, LVOT/AV            0.36  Aortic valve area, mean velocity         1.49  cm^2  Aortic valve area/bsa, mean velocity  0.72  cm^2/m^2  Legend: (L)  and  (H)  mark values outside specified reference range.  ------------------------------------------------------------------- Prepared and Electronically Authenticated by  Tobias Alexander, M.D. 2018-08-27T14:12:08    Referred for: Assessment of functional capacity   Procedure: This patient underwent staged symptom-limited exercise testing using a individualized bike protocol with expired gas analysis metabolic evaluation during exercise.  Demographics  Age: 44 Ht. (in.) 76 Wt. (lb) 177.4 BMI: 21.6   Predicted Peak VO2: 17.6  Gender: Male Ht (cm) 193 Wt. (kg) 80.5    Results  Pre-Exercise PFTs   FVC 3.56 (95%)     FEV1 2.70 (101%)      FEV1/FVC 76 (103%)      MVV 88 (71%)      Exercise Time:  8:00   Watts: 70  RPE: 17  Reason stopped: Patient ended test due to leg fatigue and difficulty maintaining pedal contact on cycle.   Additional symptoms: Dyspnea (4/10)  Resting HR: 69 Peak HR: 113  (86% age predicted max HR)  BP rest: 106/64 BP  peak: 150/78  Peak VO2: 16.1 (91% predicted peak VO2)  VE/VCO2 slope: 44  OUES: 1.45  Peak RER: 1.17  Ventilatory Threshold: 13.7 (78% predicted or measured peak VO2)  Peak RR 32  Peak Ventilation: 60.8  VE/MVV: 69%  PETCO2 at peak: 25  O2pulse: 12  (109% predicted O2pulse)   Interpretation  Notes: Patient gave a very good effort. Pulse-oximetry remained within 95-99% for the duration of exercise. Exercise was performed on a cycle ergometer starting at [facility] and increasing by 10W/min. Rest and immediate post-exercise echo were performed.   ECG: Resting ECG in normal sinus rhythm with left axis deviation and previous infarct. HR Response appropriate. There were no sustained arrhythmias and no ST-T changes. BP response appropriate.   PFT: Pre-exercise spirometry was within normal limits. The MVV was normal.   CPX: Exercise testing with gas exchange demonstrates a normal peak VO2 of 16.1 ml/kg/min (91% of the age/gender/weight matched sedentary norms). The RER of 1.17 indicates a maximal effort. The VE/VCO2 slope is significantly elevated and indicates excessive dead space ventilation. The oxygen uptake efficiency slope (OUES) is normal. The VO2 at the ventilatory threshold was normal at 78% of the predicted peak VO2. At peak exercise, the ventilation reached 69% of the measured MVV indicating ventilatory limits were beginning to approach. The O2pulse (a surrogate for stroke volume) increased with incremental exercise, reaching peak at 12 ml/beat (109%).   Conclusion: Exercise testing with gas exchange demonstrates a normal functional capacity when compared to matched sedentary norms. There is no indication for pulmonary limitations. Although PVO2 within normal range, patient is likely moderately circulatory limited with significantly elevated VE/VCO2 slope, which indicates excessive dead space ventilation.   Test, report and preliminary impression by: Lesia Hausen, MS,  ACSM-RCEP 10/22/2016 3:02 PM  Agree with above. Probable mild circulatory limitation. Markedly elevated VeVCO2 suggests elevated pulmonary pressures during exercise.   Arvilla Meres, MD  6:39 PM   Cardiac TAVR CT  TECHNIQUE: The patient was scanned on a Siemens 192 slice scanner. A 120 kV retrospective scan was triggered in the ascending thoracic aorta at 140 HU's. Gantry rotation speed was 250 msecs and collimation was .6 mm. Lopressor 5 mg given the 3D data set was reconstructed in 5% intervals of the R-R cycle. Systolic and diastolic phases were analyzed on a dedicated work station using MPR, MIP and VRT modes. The patient received 80 cc of contrast.  FINDINGS: Aortic Valve: Tri  leaflet severely calcified valve with restricted leaflet motion  Aorta: Moderate aortic root dilatation. Significant angulation of the mid aortic arch after the take off of the left subclavian. Marked tortuosity of the descending thoracic aorta  Sinotubular Junction:  34.6 mm  Ascending Thoracic Aorta:  46 mm  Aortic Arch:  39 mm  Descending Thoracic Aorta:  30 mm  Sinus of Valsalva Measurements:  Non-coronary:  39 mm  Right -coronary:  36.9 mm  Left -coronary:  37.5 mm  Coronary Artery Height above Annulus:  Left Main:  15.2 mm above annulus  Right Coronary:  20.5 mm above annulus  Virtual Basal Annulus Measurements:  Maximum/Minimum Diameter:  28.2 mm x 19.6 mm elliptical  Perimeter:  81.9 mm  Area:  458 mm2  Coronary Arteries:  Sufficient height above annulus for deployment  Optimum Fluoroscopic Angle for Delivery: LAO 2 degrees Caudal 23 degrees  IMPRESSION: 1) Severely calcified tri leaflet aortic valve with annular area 458 mm2 perimeter 81.9 mm. Suitable for a 26 mm Sapien 3 valve Although diameters smaller and annulus elliptical sinus and perimeter  Measurements suggest may be suitable for a 34 mm Evolut R valve  2) Moderate ascending  aortic root dilatation 46 mm with marked angulation of the mid arch and marked tortuosity of the descending thoracic aorta which may make delivery challenging  3) Sufficient coronary height for deployment  4) Optimum angle for deployment LAO 2 degrees Caudal 23 degrees  5) No LAA thrombus  Charlton Haws   Electronically Signed   By: Charlton Haws M.D.   On: 10/25/2016 13:29    CT ANGIOGRAPHY CHEST, ABDOMEN AND PELVIS  TECHNIQUE: Multidetector CT imaging through the chest, abdomen and pelvis was performed using the standard protocol during bolus administration of intravenous contrast. Multiplanar reconstructed images and MIPs were obtained and reviewed to evaluate the vascular anatomy.  CONTRAST:  100 mL of Isovue 370.  COMPARISON:  None.  FINDINGS: CTA CHEST FINDINGS  Cardiovascular: Heart size is borderline enlarged. There is no significant pericardial fluid, thickening or pericardial calcification. There is aortic atherosclerosis, as well as atherosclerosis of the great vessels of the mediastinum and the coronary arteries, including calcified atherosclerotic plaque in the left anterior descending, left circumflex and right coronary arteries. Severe thickening calcification of the aortic valve. Mild aneurysmal dilatation of the ascending thoracic aorta which measures up to 4.6 cm in diameter. Distal aortic arch is also ectatic measuring 3.9 cm in diameter.  Mediastinum/Lymph Nodes: No pathologically enlarged mediastinal or hilar lymph nodes. Esophagus is unremarkable in appearance. No axillary lymphadenopathy.  Lungs/Pleura: 4 mm subpleural nodule in the right middle lobe associated with the minor fissure (axial image 63 of series 7), statistically likely a benign subpleural lymph node. Areas of mild scarring in the lower lobes of lungs bilaterally. No acute consolidative airspace disease. No pleural effusions.  Musculoskeletal/Soft Tissues:  Several old healed left-sided rib fractures. There are no aggressive appearing lytic or blastic lesions noted in the visualized portions of the skeleton.  CTA ABDOMEN AND PELVIS FINDINGS  Hepatobiliary: Again noted are multiple low-attenuation lesions scattered throughout the liver, similar in size, number and distribution to the prior study. The largest of these lesions are compatible with simple cysts, measuring up to 2 cm in diameter in segment 4B. The smaller lesions are too small to definitively characterize, but are presumably small cysts or biliary hamartomas. No intra or extrahepatic biliary ductal dilatation. Gallbladder is normal in appearance.  Pancreas: No pancreatic mass. No pancreatic ductal  dilatation. No pancreatic or peripancreatic fluid or inflammatory changes.  Spleen: Unremarkable.  Adrenals/Urinary Tract: Subcentimeter low-attenuation lesion in the posterior aspect of the interpolar region of the right kidney is too small to definitively characterize, but similar to the prior study, likely a tiny cyst. Left kidney and bilateral adrenal glands are normal in appearance. No hydroureteronephrosis. Urinary bladder is normal in appearance.  Stomach/Bowel: The appearance of the stomach is normal. No pathologic dilatation of small bowel or colon. Diffuse mural thickening extending from the rectum to the hepatic flexure of the colon, which could indicate underlying colitis. Normal appendix.  Vascular/Lymphatic: Aortic atherosclerosis, with vascular findings and measurements pertinent to potential TAVR procedure, as detailed below. Aneurysmal dilatation of the left common iliac artery which measures up to 21 mm in diameter. 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 In the proximal superior mesenteric artery there is a focal area of aneurysmal dilatation with short segment dissection measuring up to 9 x 12 mm (axial  image 116 of series 6), very similar to prior CT of the abdomen and pelvis 03/25/2015. Inferior mesenteric artery is widely patent. Two renal arteries are present bilaterally. On the right, there is a very tiny branch extending to the lower pole and a dominant main right renal artery. On the left, there are 2 equal sized renal artery branches. All of these renal arteries appear widely patent without hemodynamically significant stenosis. No lymphadenopathy noted in the abdomen or pelvis.  Reproductive: Prostate gland and seminal vesicles are unremarkable in appearance.  Other: No significant volume of ascites.  No pneumoperitoneum.  Musculoskeletal: Multiple old healed fractures in the pelvis, including the left superior and inferior pubic rami, and an old fracture of the right ilium extending into the right acetabulum. There are no aggressive appearing lytic or blastic lesions noted in the visualized portions of the skeleton.  VASCULAR MEASUREMENTS PERTINENT TO TAVR:  AORTA:  Minimal Aortic Diameter -  19 x 21 mm  Severity of Aortic Calcification - mild to moderate  RIGHT PELVIS:  Right Common Iliac Artery -  Minimal Diameter - 13.6 x 12.3 mm  Tortuosity - moderate  Calcification - mild  Right External Iliac Artery -  Minimal Diameter - 11.4 x 11.6 mm  Tortuosity - severe  Calcification - none  Right Common Femoral Artery -  Minimal Diameter - 12.2 x 12.3 mm  Tortuosity - mild  Calcification - none  LEFT PELVIS:  Left Common Iliac Artery -  Minimal Diameter - 18.3 x 13.2 mm  Tortuosity - severe  Calcification - moderate  Left External Iliac Artery -  Minimal Diameter - 12.2 x 11.9 mm  Tortuosity - moderate to severe  Calcification - mild  Left Common Femoral Artery -  Minimal Diameter - 13.8 x 12.8 mm  Tortuosity - mild  Calcification - mild  Review of the MIP images confirms the above  findings.  IMPRESSION: 1. Vascular findings and measurements pertinent to potential T AVR procedure, as detailed above. This patient does appear to have suitable pelvic arterial access bilaterally. 2. Severe thickening calcification of the aortic valve, compatible with the reported clinical history of severe aortic stenosis. 3. Aortic atherosclerosis, in addition to three-vessel coronary artery disease. Please note that although the presence of coronary artery calcium documents the presence of coronary artery disease, the severity of this disease and any potential stenosis cannot be assessed on this non-gated CT examination. Assessment for potential risk factor modification, dietary therapy or  pharmacologic therapy may be warranted, if clinically indicated. 4. 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 Recommend semi-annual imaging followup by CTA or MRA and referral to cardiothoracic surgery if not already obtained. This recommendation follows 2010 ACCF/AHA/AATS/ACR/ASA/SCA/SCAI/SIR/STS/SVM Guidelines for the Diagnosis and Management of Patients With Thoracic Aortic Disease. Circulation. 2010; 121: W098-J191. 5. Mild diffuse rectal and colonic wall thickening extending from the rectum to the level of the hepatic flexure of the colon, suggesting underlying colitis. 6. Additional incidental findings, as above. Aortic Atherosclerosis (ICD10-I70.0) and Aortic aneurysm NOS (ICD10-I71.9).   Electronically Signed   By: Trudie Reed M.D.   On: 10/25/2016 12:28    Echocardiography  Patient:    Torrey, Ballinas MR #:       [id number] Study Date: 01/07/2017 Gender:     M Age:        89 Height:     193 cm Weight:     80.3 kg BSA:        2.07 m^2 Pt. Status: Room:   ATTENDING    Tressie Stalker, M.D.  ORDERING      Tressie Stalker, M.D.  REFERRING    Tressie Stalker, M.D.  SONOGRAPHER  Dewitt Hoes, RDCS  PERFORMING   [facility] Outpatient  cc:  ------------------------------------------------------------------- LV EF: 65% -   70%  ------------------------------------------------------------------- Indications:      Aortic stenosis (I35).  ------------------------------------------------------------------- History:   PMH:   Coronary artery disease.  Aortic valve disease. Risk factors:  PAD. Pulmonary hypertension. Hypertension. Dyslipidemia.  ------------------------------------------------------------------- Study Conclusions  - Left ventricle: The cavity size was normal. There was moderate   concentric hypertrophy. Systolic function was vigorous. The   estimated ejection fraction was in the range of 65% to 70%. Wall   motion was normal; there were no regional wall motion   abnormalities. Doppler parameters are consistent with abnormal   left ventricular relaxation (grade 1 diastolic dysfunction).   Doppler parameters are consistent with elevated ventricular   end-diastolic filling pressure. - Aortic valve: There was mild regurgitation. Mean gradient (S): 66   mm Hg. Peak gradient (S): 110 mm Hg. - Ascending aorta: The ascending aorta was moderately dilated   measuring 46 mm. - Right ventricle: Systolic function was normal. - Right atrium: The atrium was moderately dilated. - Tricuspid valve: There was moderate regurgitation. - Pulmonic valve: There was mild regurgitation. - Pulmonary arteries: Systolic pressure was moderately increased.   PA peak pressure: 56 mm Hg (S). - Inferior vena cava: The vessel was normal in size. - Pericardium, extracardiac: There was no pericardial effusion.  Impressions:  - Hyperdynamic LVEF. Critical aortic stenosis.  ------------------------------------------------------------------- Labs, prior tests, procedures, and surgery: Stress  echocardiography (10/22/2016).  ------------------------------------------------------------------- Study data:  Comparison was made to the study of 10/22/2016.  Study status:  Routine.  Procedure:  The patient reported no pain pre or post test. Transthoracic echocardiography. Image quality was adequate.  Study completion:  There were no complications. Echocardiography.  M-mode, complete 2D, 3D, spectral Doppler, and color Doppler.  Birthdate:  Patient birthdate: [birth year].  Age: Patient is 83 yr old.  Sex:  Gender: male.    BMI: 21.6 kg/m^2. Blood pressure:     115/73  Patient status:  Outpatient.  Study date:  Study date: 01/07/2017. Study time: 10:40 AM.  Location: [facility]  -------------------------------------------------------------------  ------------------------------------------------------------------- Left  ventricle:  The cavity size was normal. There was moderate concentric hypertrophy. Systolic function was vigorous. The estimated ejection fraction was in the range of 65% to 70%. Wall motion was normal; there were no regional wall motion abnormalities. Doppler parameters are consistent with abnormal left ventricular relaxation (grade 1 diastolic dysfunction). Doppler parameters are consistent with elevated ventricular end-diastolic filling pressure.  ------------------------------------------------------------------- Aortic valve:   Trileaflet; severely thickened, severely calcified leaflets. Mobility was not restricted.  Doppler:  Transvalvular velocity was within the normal range. There was no stenosis. There was mild regurgitation.    VTI ratio of LVOT to aortic valve: 0.3. Valve area (VTI): 0.94 cm^2. Indexed valve area (VTI): 0.45 cm^2/m^2. Peak velocity ratio of LVOT to aortic valve: 0.24. Valve area (Vmax): 0.77 cm^2. Indexed valve area (Vmax): 0.37 cm^2/m^2. Mean velocity ratio of LVOT to aortic valve: 0.27. Valve area (Vmean): 0.86 cm^2. Indexed  valve area (Vmean): 0.42 cm^2/m^2. Mean gradient (S): 66 mm Hg. Peak gradient (S): 110 mm Hg.  ------------------------------------------------------------------- Aorta:  Aortic root: The aortic root was normal in size. 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  ------------------------------------------------------------------- Mitral valve:   Structurally normal valve.   Mobility was not restricted.  Doppler:  Transvalvular velocity was within the normal range. There was no evidence for stenosis. There was no regurgitation.    Peak gradient (D): 2 mm Hg.  ------------------------------------------------------------------- Left atrium:  The atrium was normal in size.  ------------------------------------------------------------------- Right ventricle:  The cavity size was normal. Wall thickness was normal. Systolic function was normal.  ------------------------------------------------------------------- Pulmonic valve:    Structurally normal valve.   Cusp separation was normal.  Doppler:  Transvalvular velocity was within the normal range. There was no evidence for stenosis. There was mild regurgitation.  ------------------------------------------------------------------- Tricuspid valve:   Structurally normal valve.    Doppler: Transvalvular velocity was within the normal range. There was moderate regurgitation.  ------------------------------------------------------------------- Pulmonary artery:   The main pulmonary artery was normal-sized. Systolic pressure was moderately increased.  ------------------------------------------------------------------- Right atrium:  The atrium was moderately dilated.  ------------------------------------------------------------------- Pericardium:  There was no pericardial effusion.  ------------------------------------------------------------------- Systemic veins: Inferior vena cava: The  vessel was normal in size.  ------------------------------------------------------------------- Measurements   Left ventricle                            Value          Reference  LV ID, ED, PLAX chordal           (L)     38    mm       43 - 52  LV ID, ES, PLAX chordal                   27.1  mm       23 - 38  LV fx shortening, PLAX chordal            29    %        >=29  LV PW thickness, ED                       11    mm       ---------  IVS/LV PW ratio, ED               (H)     1.45           <=1.3  Stroke volume, 2D                         101   ml       ---------  Stroke volume/bsa, 2D                     49    ml/m^2   ---------  LV e&', lateral                            4.35  cm/s     ---------  LV E/e&', lateral                          17.61          ---------  LV e&', medial                             4.46  cm/s     ---------  LV E/e&', medial                           17.17          ---------  LV e&', average                            4.41  cm/s     ---------  LV E/e&', average                          17.39          ---------    Ventricular septum                        Value          Reference  IVS thickness, ED                         15.9  mm       ---------    LVOT                                      Value          Reference  LVOT ID, S                                20    mm       ---------  LVOT area                                 3.14  cm^2     ---------  LVOT peak velocity, S                     122   cm/s     ---------  LVOT mean velocity, S                     98.9  cm/s     ---------  LVOT VTI, S  32.3  cm       ---------  LVOT peak gradient, S                     6     mm Hg    ---------    Aortic valve                              Value          Reference  Aortic valve peak velocity, S             498   cm/s     ---------  Aortic valve mean velocity, S             360   cm/s     ---------  Aortic valve VTI, S                        108   cm       ---------  Aortic mean gradient, S                   61    mm Hg    ---------  Aortic peak gradient, S                   99    mm Hg    ---------  VTI ratio, LVOT/AV                        0.3            ---------  Aortic valve area, VTI                    0.94  cm^2     ---------  Aortic valve area/bsa, VTI                0.45  cm^2/m^2 ---------  Velocity ratio, peak, LVOT/AV             0.24           ---------  Aortic valve area, peak velocity          0.77  cm^2     ---------  Aortic valve area/bsa, peak               0.37  cm^2/m^2 ---------  velocity  Velocity ratio, mean, LVOT/AV             0.27           ---------  Aortic valve area, mean velocity          0.86  cm^2     ---------  Aortic valve area/bsa, mean               0.42  cm^2/m^2 ---------  velocity    Aorta                                     Value          Reference  Aortic root ID, ED                        40    mm       ---------  Ascending aorta ID, A-P, S  45    mm       ---------    Left atrium                               Value          Reference  LA ID, A-P, ES                            41    mm       ---------  LA ID/bsa, A-P                            1.98  cm/m^2   <=2.2  LA volume, S                              43.6  ml       ---------  LA volume/bsa, S                          21.1  ml/m^2   ---------  LA volume, ES, 1-p A4C                    26.6  ml       ---------  LA volume/bsa, ES, 1-p A4C                12.9  ml/m^2   ---------  LA volume, ES, 1-p A2C                    60.6  ml       ---------  LA volume/bsa, ES, 1-p A2C                29.3  ml/m^2   ---------    Mitral valve                              Value          Reference  Mitral E-wave peak velocity               76.6  cm/s     ---------  Mitral A-wave peak velocity               111   cm/s     ---------  Mitral deceleration time          (H)     327   ms       150 - 230  Mitral peak gradient, D                    2     mm Hg    ---------  Mitral E/A ratio, peak                    0.7            ---------    Pulmonary arteries                        Value          Reference  PA pressure, S, DP                (  H)     56    mm Hg    <=30    Tricuspid valve                           Value          Reference  Tricuspid regurg peak velocity            347   cm/s     ---------  Tricuspid peak RV-RA gradient             48    mm Hg    ---------    Systemic veins                            Value          Reference  Estimated CVP                             8     mm Hg    ---------    Right ventricle                           Value          Reference  TAPSE                                     31.3  mm       ---------  RV pressure, S, DP                (H)     56    mm Hg    <=30  RV s&', lateral, S                         15    cm/s     ---------    Pulmonic valve                            Value          Reference  Pulmonic regurg velocity, ED              147   cm/s     ---------  Pulmonic regurg gradient, ED              9     mm Hg    ---------  Legend: (L)  and  (H)  mark values outside specified reference range.  ------------------------------------------------------------------- Prepared and Electronically Authenticated by  Tobias Alexander, M.D. 2018-11-12T11:46:06      Impression:  Patient has at least stage C1 very severe asymptomatic aortic stenosis and probably early stage D symptomatic disease.  I have personally reviewed the patient's follow-up echocardiogram performed earlier today which reveals significant progression of disease with peak velocity across the aortic valve now measured 5 m/s at rest corresponding to a mean transvalvular gradient greater than 60 mmHg.  Left ventricular systolic function remains normal.  Cardiac-gated CTA of the heart reveals anatomical characteristics consistent with aortic stenosis suitable for treatment by transcatheter aortic valve  replacement without any significant complicating features and CTA of the aorta and iliac vessels demonstrate what appears to be adequate pelvic vascular access  to facilitate a transfemoral approach.  The patient would likely be good candidate for transcatheter aortic valve replacement using percutaneous transfemoral access, although diagnostic cardiac catheterization has not been performed.    Plan:  The patient and his wife were counseled at length regarding treatment alternatives for management of severe symptomatic aortic stenosis. Alternative approaches such as conventional aortic valve replacement, transcatheter aortic valve replacement, and continued medical therapy were compared and contrasted at length.  The risks associated with conventional surgical aortic valve replacement were been discussed in detail, as were expectations for post-operative convalescence, and why I would be reluctant to consider this patient a candidate for conventional surgery.  Issues specific to transcatheter aortic valve replacement were discussed including questions about long term valve durability, the potential for paravalvular leak, possible increased risk of need for permanent pacemaker placement, and other technical complications related to the procedure itself.  Long-term prognosis with medical therapy was discussed. This discussion was placed in the context of the patient's own specific clinical presentation and past medical history.  All of their questions been addressed.  The patient desires to proceed with transcatheter aortic valve replacement in the near future.  His next step he will need to undergo diagnostic cardiac catheterization to rule out the presence of significant coronary artery disease.  We tentatively plan to proceed with transcatheter aortic valve replacement on Tuesday, January 22, 2017.  Prior to surgery he will need to have another surgical consultation for a second opinion.  Following the  decision to proceed with transcatheter aortic valve replacement, a discussion has been held regarding what types of management strategies would be attempted intraoperatively in the event of life-threatening complications, including whether or not the patient would be considered a candidate for the use of cardiopulmonary bypass and/or conversion to open sternotomy for attempted surgical intervention.  The patient has been advised of a variety of complications that might develop including but not limited to risks of death, stroke, paravalvular leak, aortic dissection or other major vascular complications, aortic annulus rupture, device embolization, cardiac rupture or perforation, mitral regurgitation, acute myocardial infarction, arrhythmia, heart block or bradycardia requiring permanent pacemaker placement, congestive heart failure, respiratory failure, renal failure, pneumonia, infection, other late complications related to structural valve deterioration or migration, or other complications that might ultimately cause a temporary or permanent loss of functional independence or other long term morbidity.  The patient provides full informed consent for the procedure as described and all questions were answered.   I spent in excess of 15 minutes during the conduct of this office consultation and >50% of this time involved direct face-to-face encounter with the patient for counseling and/or coordination of their care.    Salvatore Decent. Cornelius Moras, MD 01/07/2017 5:23 PM

## 2017-01-08 ENCOUNTER — Other Ambulatory Visit: Payer: Self-pay

## 2017-01-08 DIAGNOSIS — I350 Nonrheumatic aortic (valve) stenosis: Secondary | ICD-10-CM

## 2017-01-09 ENCOUNTER — Other Ambulatory Visit: Payer: Self-pay

## 2017-01-09 DIAGNOSIS — I350 Nonrheumatic aortic (valve) stenosis: Secondary | ICD-10-CM

## 2017-01-10 ENCOUNTER — Telehealth: Payer: Self-pay

## 2017-01-10 NOTE — Telephone Encounter (Signed)
Patient [facility] at [facility] scheduled for:  01/11/2017 @ 1200 Verified arrival time and place:  NT @ 1000 Confirmed AM meds to be taken pre-cath with sip of water: Take ASA Confirmed patient has responsible person to drive home post procedure and observe patient for 24 hours:  yes Addl concerns:    none

## 2017-01-11 ENCOUNTER — Encounter (HOSPITAL_COMMUNITY)
Admission: RE | Disposition: A | Discharge status: Home / Self Care | Payer: Self-pay | Source: Ambulatory Visit | Attending: Cardiovascular Disease

## 2017-01-11 ENCOUNTER — Ambulatory Visit (HOSPITAL_COMMUNITY)
Admission: RE | Admit: 2017-01-11 | Discharge: 2017-01-11 | Disposition: A | Discharge status: Home / Self Care | Payer: Medicare HMO | Source: Ambulatory Visit | Attending: Cardiovascular Disease | Admitting: Cardiovascular Disease

## 2017-01-11 DIAGNOSIS — Z7982 Long term (current) use of aspirin: Secondary | ICD-10-CM | POA: Diagnosis not present

## 2017-01-11 DIAGNOSIS — I10 Essential (primary) hypertension: Secondary | ICD-10-CM | POA: Insufficient documentation

## 2017-01-11 DIAGNOSIS — E785 Hyperlipidemia, unspecified: Secondary | ICD-10-CM | POA: Diagnosis not present

## 2017-01-11 DIAGNOSIS — E039 Hypothyroidism, unspecified: Secondary | ICD-10-CM | POA: Diagnosis not present

## 2017-01-11 DIAGNOSIS — Z79899 Other long term (current) drug therapy: Secondary | ICD-10-CM | POA: Insufficient documentation

## 2017-01-11 DIAGNOSIS — Z79890 Hormone replacement therapy (postmenopausal): Secondary | ICD-10-CM | POA: Insufficient documentation

## 2017-01-11 DIAGNOSIS — Z7983 Long term (current) use of bisphosphonates: Secondary | ICD-10-CM | POA: Insufficient documentation

## 2017-01-11 DIAGNOSIS — I350 Nonrheumatic aortic (valve) stenosis: Secondary | ICD-10-CM | POA: Insufficient documentation

## 2017-01-11 DIAGNOSIS — I2510 Atherosclerotic heart disease of native coronary artery without angina pectoris: Secondary | ICD-10-CM | POA: Diagnosis not present

## 2017-01-11 DIAGNOSIS — I739 Peripheral vascular disease, unspecified: Secondary | ICD-10-CM | POA: Insufficient documentation

## 2017-01-11 HISTORY — PX: RIGHT/LEFT HEART CATH AND CORONARY ANGIOGRAPHY: CATH118266

## 2017-01-11 LAB — POCT I-STAT 3, ART BLOOD GAS (G3+)
ACID-BASE EXCESS: 1 mmol/L (ref 0.0–2.0)
ACID-BASE EXCESS: 1 mmol/L (ref 0.0–2.0)
BICARBONATE: 23.8 mmol/L (ref 20.0–28.0)
Bicarbonate: 22.4 mmol/L (ref 20.0–28.0)
O2 SAT: 99 %
O2 Saturation: 64 %
PCO2 ART: 26.5 mmHg — AB (ref 32.0–48.0)
PH ART: 7.482 — AB (ref 7.350–7.450)
PO2 ART: 114 mmHg — AB (ref 83.0–108.0)
PO2 ART: 30 mmHg — AB (ref 83.0–108.0)
TCO2: 23 mmol/L (ref 22–32)
TCO2: 25 mmol/L (ref 22–32)
pCO2 arterial: 31.8 mmHg — ABNORMAL LOW (ref 32.0–48.0)
pH, Arterial: 7.534 — ABNORMAL HIGH (ref 7.350–7.450)

## 2017-01-11 LAB — BASIC METABOLIC PANEL
Anion gap: 7 (ref 5–15)
BUN: 9 mg/dL (ref 6–20)
CHLORIDE: 106 mmol/L (ref 101–111)
CO2: 24 mmol/L (ref 22–32)
Calcium: 9 mg/dL (ref 8.9–10.3)
Creatinine, Ser: 0.87 mg/dL (ref 0.61–1.24)
GFR calc Af Amer: >60 mL/min (ref >60)
GFR calc non Af Amer: >60 mL/min (ref >60)
GLUCOSE: 100 mg/dL — AB (ref 65–99)
POTASSIUM: 3.7 mmol/L (ref 3.5–5.1)
Sodium: 137 mmol/L (ref 135–145)

## 2017-01-11 LAB — CBC
HCT: 37.1 % — ABNORMAL LOW (ref 39.0–52.0)
HEMOGLOBIN: 11.5 g/dL — AB (ref 13.0–17.0)
MCH: 21.8 pg — AB (ref 26.0–34.0)
MCHC: 31 g/dL (ref 30.0–36.0)
MCV: 70.4 fL — AB (ref 78.0–100.0)
Platelets: 226 10*3/uL (ref 150–400)
RBC: 5.27 MIL/uL (ref 4.22–5.81)
RDW: 15.8 % — ABNORMAL HIGH (ref 11.5–15.5)
WBC: 7.4 10*3/uL (ref 4.0–10.5)

## 2017-01-11 LAB — PROTIME-INR
INR: 1.01
PROTHROMBIN TIME: 13.2 s (ref 11.4–15.2)

## 2017-01-11 SURGERY — RIGHT/LEFT HEART CATH AND CORONARY ANGIOGRAPHY
Anesthesia: LOCAL

## 2017-01-11 MED ORDER — ASPIRIN 81 MG PO CHEW: 81.0000 mg | CHEWABLE_TABLET | ORAL | Status: DC

## 2017-01-11 MED ORDER — MIDAZOLAM HCL 2 MG/2ML IJ SOLN
INTRAMUSCULAR | Status: DC | PRN
  Administered 2017-01-11 (×2): 1 mg via INTRAVENOUS

## 2017-01-11 MED ORDER — SODIUM CHLORIDE 0.9 % WEIGHT BASED INFUSION: 1.0000 mL/kg/h | INTRAVENOUS | Status: DC

## 2017-01-11 MED ORDER — LIDOCAINE HCL (PF) 1 % IJ SOLN
INTRAMUSCULAR | Status: AC
Start: 2017-01-11 — End: 2017-01-11
  Filled 2017-01-11: qty 30

## 2017-01-11 MED ORDER — IOPAMIDOL (ISOVUE-370) INJECTION 76%
INTRAVENOUS | Status: AC
  Filled 2017-01-11: qty 100

## 2017-01-11 MED ORDER — VERAPAMIL HCL 2.5 MG/ML IV SOLN
INTRAVENOUS | Status: DC | PRN
  Administered 2017-01-11: 10 mL via INTRA_ARTERIAL

## 2017-01-11 MED ORDER — HEPARIN (PORCINE) IN NACL 2-0.9 UNIT/ML-% IJ SOLN
INTRAMUSCULAR | Status: AC
  Filled 2017-01-11: qty 1000

## 2017-01-11 MED ORDER — SODIUM CHLORIDE 0.9% FLUSH: 3.0000 mL | Freq: Two times a day (BID) | INTRAVENOUS | Status: DC

## 2017-01-11 MED ORDER — VERAPAMIL HCL 2.5 MG/ML IV SOLN
INTRAVENOUS | Status: AC
  Filled 2017-01-11: qty 2

## 2017-01-11 MED ORDER — LIDOCAINE HCL (PF) 1 % IJ SOLN
INTRAMUSCULAR | Status: DC | PRN
  Administered 2017-01-11 (×2): 2 mL

## 2017-01-11 MED ORDER — SODIUM CHLORIDE 0.9% FLUSH: 3.0000 mL | INTRAVENOUS | Status: DC | PRN

## 2017-01-11 MED ORDER — HEPARIN SODIUM (PORCINE) 1000 UNIT/ML IJ SOLN
INTRAMUSCULAR | Status: AC
  Filled 2017-01-11: qty 1

## 2017-01-11 MED ORDER — SODIUM CHLORIDE 0.9 % IV SOLN: 250.0000 mL | INTRAVENOUS | Status: DC | PRN

## 2017-01-11 MED ORDER — FENTANYL CITRATE (PF) 100 MCG/2ML IJ SOLN
INTRAMUSCULAR | Status: DC | PRN
  Administered 2017-01-11 (×2): 25 ug via INTRAVENOUS

## 2017-01-11 MED ORDER — SODIUM CHLORIDE 0.9 % WEIGHT BASED INFUSION
3.0000 mL/kg/h | INTRAVENOUS | Status: AC
  Administered 2017-01-11: 3 mL/kg/h via INTRAVENOUS

## 2017-01-11 MED ORDER — FENTANYL CITRATE (PF) 100 MCG/2ML IJ SOLN
INTRAMUSCULAR | Status: AC
  Filled 2017-01-11: qty 2

## 2017-01-11 MED ORDER — HEPARIN SODIUM (PORCINE) 1000 UNIT/ML IJ SOLN
INTRAMUSCULAR | Status: DC | PRN
  Administered 2017-01-11: 4000 [IU] via INTRAVENOUS

## 2017-01-11 MED ORDER — MIDAZOLAM HCL 2 MG/2ML IJ SOLN
INTRAMUSCULAR | Status: AC
  Filled 2017-01-11: qty 2

## 2017-01-11 MED ORDER — HEPARIN (PORCINE) IN NACL 2-0.9 UNIT/ML-% IJ SOLN
INTRAMUSCULAR | Status: AC | PRN
  Administered 2017-01-11: 1000 mL

## 2017-01-11 MED ORDER — IOPAMIDOL (ISOVUE-370) INJECTION 76%
INTRAVENOUS | Status: DC | PRN
  Administered 2017-01-11: 90 mL via INTRA_ARTERIAL

## 2017-01-11 SURGICAL SUPPLY — 20 items
CATH 5FR JL3.5 JR4 ANG PIG MP (CATHETERS) ×2 IMPLANT
CATH BALLN WEDGE 5F 110CM (CATHETERS) ×2 IMPLANT
CATH INFINITI 5 FR AR1 MOD (CATHETERS) ×2 IMPLANT
CATH INFINITI 5FR AL1 (CATHETERS) ×2 IMPLANT
CATH INFINITI 5FR JL4 (CATHETERS) ×2 IMPLANT
CATH INFINITI 5FR JL5 (CATHETERS) ×2 IMPLANT
CATH INFINITI JR5 6F 100CM (CATHETERS) ×2 IMPLANT
DEVICE RAD COMP TR BAND LRG (VASCULAR PRODUCTS) ×2 IMPLANT
GLIDESHEATH SLEND SS 6F .021 (SHEATH) ×2 IMPLANT
GUIDEWIRE ANGLED .035X150CM (WIRE) ×2 IMPLANT
GUIDEWIRE INQWIRE 1.5J.035X260 (WIRE) ×1 IMPLANT
INQWIRE 1.5J .035X260CM (WIRE) ×2
KIT ESSENTIALS PG (KITS) ×2 IMPLANT
KIT HEART LEFT (KITS) ×2 IMPLANT
PACK CARDIAC CATHETERIZATION (CUSTOM PROCEDURE TRAY) ×2 IMPLANT
SHEATH GLIDE SLENDER 4/5FR (SHEATH) ×2 IMPLANT
TRANSDUCER W/STOPCOCK (MISCELLANEOUS) ×2 IMPLANT
TUBING CIL FLEX 10 FLL-RA (TUBING) ×2 IMPLANT
WIRE COUGAR XT STRL 190CM (WIRE) ×2 IMPLANT
WIRE HI TORQ VERSACORE-J 145CM (WIRE) ×2 IMPLANT

## 2017-01-11 NOTE — Discharge Instructions (Signed)

## 2017-01-11 NOTE — Interval H&P Note (Signed)
History and Physical Interval Note:  01/11/2017 11:57 AM  Charles Patrick  has presented today for surgery, with the diagnosis of arotic stenosis  The various methods of treatment have been discussed with the patient and family. After consideration of risks, benefits and other options for treatment, the patient has consented to  Procedure(s): RIGHT/LEFT HEART CATH AND CORONARY ANGIOGRAPHY (N/A) as a surgical intervention .  The patient's history has been reviewed, patient examined, no change in status, stable for surgery.  I have reviewed the patient's chart and labs.  Questions were answered to the patient's satisfaction.    Pt here for pre-TAVR right and left heart cath. Risks, indications, alternatives reviewed with patient and family. All questions answered. Plan right radial/right antecubital approach.  Audelia HivesMichale Tamecca Artiga

## 2017-01-14 ENCOUNTER — Encounter: Payer: Self-pay | Admitting: Thoracic Surgery (Cardiothoracic Vascular Surgery)

## 2017-01-14 ENCOUNTER — Encounter (HOSPITAL_COMMUNITY): Payer: Self-pay | Admitting: Cardiovascular Disease

## 2017-01-14 ENCOUNTER — Ambulatory Visit (HOSPITAL_BASED_OUTPATIENT_CLINIC_OR_DEPARTMENT_OTHER)
Admission: RE | Admit: 2017-01-14 | Discharge: 2017-01-14 | Disposition: A | Discharge status: Home / Self Care | Payer: Medicare HMO | Source: Ambulatory Visit | Attending: Thoracic Surgery (Cardiothoracic Vascular Surgery) | Admitting: Thoracic Surgery (Cardiothoracic Vascular Surgery)

## 2017-01-14 ENCOUNTER — Ambulatory Visit: Payer: Medicare HMO | Attending: Thoracic Surgery (Cardiothoracic Vascular Surgery) | Admitting: Physical Therapy

## 2017-01-14 ENCOUNTER — Ambulatory Visit (HOSPITAL_COMMUNITY)
Admission: RE | Admit: 2017-01-14 | Discharge: 2017-01-14 | Disposition: A | Discharge status: Home / Self Care | Payer: Medicare HMO | Source: Ambulatory Visit | Attending: Thoracic Surgery (Cardiothoracic Vascular Surgery) | Admitting: Thoracic Surgery (Cardiothoracic Vascular Surgery)

## 2017-01-14 DIAGNOSIS — I350 Nonrheumatic aortic (valve) stenosis: Secondary | ICD-10-CM

## 2017-01-14 DIAGNOSIS — R2689 Other abnormalities of gait and mobility: Secondary | ICD-10-CM | POA: Diagnosis present

## 2017-01-14 DIAGNOSIS — R293 Abnormal posture: Secondary | ICD-10-CM | POA: Insufficient documentation

## 2017-01-14 LAB — PULMONARY FUNCTION TEST
DL/VA % pred: 64 %
DL/VA: 3.16 ml/min/mmHg/L
DLCO UNC: 15.63 ml/min/mmHg
DLCO cor % pred: 42 %
DLCO cor: 17.37 ml/min/mmHg
DLCO unc % pred: 38 %
FEF 25-75 POST: 1.52 L/s
FEF 25-75 Pre: 2.45 L/sec
FEF2575-%Change-Post: -38 %
FEF2575-%Pred-Post: 68 %
FEF2575-%Pred-Pre: 110 %
FEV1-%CHANGE-POST: -18 %
FEV1-%PRED-POST: 73 %
FEV1-%PRED-PRE: 90 %
FEV1-POST: 2.35 L
FEV1-PRE: 2.88 L
FEV1FVC-%Change-Post: -13 %
FEV1FVC-%PRED-PRE: 106 %
FEV6-%Change-Post: -4 %
FEV6-%PRED-POST: 83 %
FEV6-%PRED-PRE: 88 %
FEV6-POST: 3.49 L
FEV6-Pre: 3.67 L
FEV6FVC-%CHANGE-POST: 1 %
FEV6FVC-%PRED-POST: 105 %
FEV6FVC-%Pred-Pre: 103 %
FVC-%CHANGE-POST: -6 %
FVC-%PRED-POST: 79 %
FVC-%Pred-Pre: 84 %
FVC-POST: 3.49 L
FVC-Pre: 3.72 L
POST FEV6/FVC RATIO: 100 %
Post FEV1/FVC ratio: 67 %
Pre FEV1/FVC ratio: 78 %
Pre FEV6/FVC Ratio: 99 %
RV % PRED: 79 %
RV: 2.53 L
TLC % PRED: 76 %
TLC: 6.37 L

## 2017-01-14 MED ORDER — ALBUTEROL SULFATE (2.5 MG/3ML) 0.083% IN NEBU
2.5000 mg | INHALATION_SOLUTION | Freq: Once | RESPIRATORY_TRACT | Status: AC
Start: 2017-01-14 — End: 2017-01-14
  Administered 2017-01-14: 2.5 mg via RESPIRATORY_TRACT

## 2017-01-14 NOTE — Therapy (Signed)
[facility] [address] [place], [place], [address] Phone: [phone]   Fax:  [phone]  Physical Therapy Evaluation  Patient Details  Name: Charles KaufmannDavid B Toomey MRN: [id number] Date of Birth: 1924-05-05 Referring Provider: Dr. Tressie Stalkerlarence Owen   Encounter Date: 01/14/2017  PT End of Session - 01/14/17 0912    Visit Number  1    PT Start Time  0836    PT Stop Time  0913    PT Time Calculation (min)  37 min       Past Medical History:  Diagnosis Date  . Anemia   . Aortic valve stenosis   . Arthropathy   . BPH associated with nocturia   . Carotid artery disease (HCC)   . Colon polyp   . Dilated aortic root (HCC)   . Elevated PSA   . Erectile dysfunction   . Gastric paresis   . Hypercholesterolemia   . Hyperglycemia   . Hypertension    Patient denies  . Hypothyroid   . Iron deficiency   . PAD (peripheral artery disease) (HCC)   . Pulmonary hypertension (HCC)   . Seizure disorder (HCC)   . Thoracic aortic aneurysm (HCC)   . Thrombocytopenia (HCC)   . Thyroid nodule   . Tricuspid regurgitation   . Vitamin D deficiency     Past Surgical History:  Procedure Laterality Date  . Extraction of tooth #'s 4, [id number],27,28,29 with alveoloplasty N/A 10/17/2016   Performed by [name], [name], DDS [facility]  . NO PAST SURGERIES    . RIGHT/LEFT HEART CATH AND CORONARY ANGIOGRAPHY N/A 01/11/2017   Performed by [name], [name], MD [facility]    There were no vitals filed for this visit.   Subjective Assessment - 01/14/17 0841    Subjective  Pt reports minimal to no symptoms. 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     Patient Stated Goals  to fix heart and stay active    Currently in Pain?  No/denies         [facility] PT Assessment - 01/14/17 0001      Assessment   Medical Diagnosis  severe aortic stenosis    Referring Provider  Dr. Tressie Stalkerlarence Owen    Onset Date/Surgical Date  01/07/17      Precautions   Precautions  None      Restrictions   Weight Bearing Restrictions  No      Balance Screen   Has the patient fallen in the past 6 months  No    Has the patient had a decrease in activity level because of a fear of falling?   No    Is the patient reluctant to leave their home because of a fear of falling?   No      Home Environment   Living Environment  Private residence    Type of Home  House    Home Access  Stairs to enter    Entrance Stairs-Number of Steps  2    Entrance Stairs-Rails  Right      Prior Function   Level of Independence  Independent with community mobility without device      Posture/Postural Control   Posture/Postural Control  Postural limitations    Postural Limitations  Rounded Shoulders;Forward head      ROM / Strength   AROM / PROM / Strength  AROM;Strength      AROM   Overall AROM Comments  grossly  [facility]      Strength   Overall Strength Comments  grossly 5/5 except R DF 4/5, L DF 4+/5    Strength Assessment Site  Hand    Right/Left hand  Right;Left    Right Hand Grip (lbs)  Not tested due to restrictions post heart cath Friday 11/16 R hand dominant    Left Hand Grip (lbs)  45      Ambulation/Gait   Gait Comments  Pt ambulate swith R steppage gait due to old R foot injury and bil. knee flex. Pt's gait distance limited by 32%.       [facility] Pre-Surgical Assessment - 2017-02-11 0001    5 Meter Walk Test- trial 1  6 sec    5 Meter Walk Test- trial 2  6 sec.     5 Meter Walk Test- trial 3  6 sec.    5 meter walk test average  6 sec    4 Stage Balance Test tolerated for:   10 sec.    4 Stage Balance Test Position  2    Sit To Stand Test- trial 1  15 sec.    ADL/IADL Independent with:  Bathing;Dressing;Meal prep;Finances;Yard work    6 Minute Walk- Baseline  yes    BP (mmHg)  118/82    HR (bpm)  68    02 Sat (%RA)  99 %    Modified Borg Scale for Dyspnea  0- Nothing at all    Perceived Rate of Exertion (Borg)  6-    6 Minute Walk Post  Test  yes    BP (mmHg)  129/81    HR (bpm)  86    02 Sat (%RA)  99 %    Modified Borg Scale for Dyspnea  0- Nothing at all    Perceived Rate of Exertion (Borg)  7- Very, very light    Aerobic Endurance Distance Walked  925           Objective measurements completed on examination: See above findings.              PT Education - February 11, 2017 0912    Education provided  Yes    Education Details  fall risk    Person(s) Educated  Patient;Spouse    Methods  Explanation    Comprehension  Verbalized understanding                  Plan - 02/11/2017 0913    Clinical Impression Statement  see below    PT Frequency  One time visit     Clinical Impression Statement: Pt is a 83 yo male presenting to OP PT for evaluation prior to possible TAVR surgery due to severe aortic stenosis. Pt reports a little slowing down but no real symptoms affecting his everyday activity. Pt continues to work in [employer] 6 days/week. Pt presents with good ROM and strength overall with  Mild ankle weakness, fair balance and is at high fall risk 4 stage balance test, good walking speed and fair to good aerobic endurance per 6 minute walk test. Pt ambulated a total of 925 feet in 6 minute walk. Based on the Short Physical Performance Battery, patient has a frailty rating of 7/12 with </= 5/12 considered frail.    Patient demonstrated the following deficits and impairments:     Visit Diagnosis: Other abnormalities of gait and mobility  Abnormal posture  G-Codes - 2017/02/11 0913    Functional Assessment Tool Used (Outpatient Only)  6  minute walk 925'    Functional Limitation  Mobility: Walking and moving around    Mobility: Walking and Moving Around Current Status [phone](G8978)  At least 20 percent but less than 40 percent impaired, limited or restricted    Mobility: Walking and Moving Around Goal Status [phone](G8979)  At least 20 percent but less than 40 percent impaired, limited or restricted     Mobility: Walking and Moving Around Discharge Status [phone](G8980)  At least 20 percent but less than 40 percent impaired, limited or restricted        Problem List Patient Active Problem List   Diagnosis Date Noted  . HTN (hypertension) 10/08/2016  . HLD (hyperlipidemia) 10/08/2016  . Hypothyroidism 10/08/2016  . Pulmonary hypertension (HCC) 10/08/2016  . Tricuspid regurgitation 10/08/2016  . PAD (peripheral artery disease) (HCC)   . Carotid artery disease (HCC)   . Severe aortic stenosis     Chynna Buerkle, PT 01/14/2017, 9:19 AM  [facility] [address] [place], [place], [address] Phone: [phone]   Fax:  [phone]  Name: Charles KaufmannDavid B Gorczyca MRN: [id number] Date of Birth: [birth year]

## 2017-01-14 NOTE — Progress Notes (Signed)
Bilateral carotid duplex completed. No evidence of a significant ICA stenosis. Vertebral artery flow is antegrade.  [employer] ElectricVirginia Medina Patrick. RVS 01/14/2017 1:53 PM

## 2017-01-15 ENCOUNTER — Other Ambulatory Visit: Payer: Self-pay

## 2017-01-15 ENCOUNTER — Institutional Professional Consult (permissible substitution) (INDEPENDENT_AMBULATORY_CARE_PROVIDER_SITE_OTHER): Payer: Medicare HMO | Admitting: Surgery

## 2017-01-15 ENCOUNTER — Encounter: Payer: Self-pay | Admitting: Surgery

## 2017-01-15 VITALS — BP 103/69 | HR 76 | Ht 76.0 in | Wt 177.0 lb

## 2017-01-15 DIAGNOSIS — I350 Nonrheumatic aortic (valve) stenosis: Secondary | ICD-10-CM

## 2017-01-15 NOTE — Patient Instructions (Signed)
You are scheduled for Pre Admission testing on Friday, January 18, 2017 at 11:00 AM at [facility] (Valet parking entrance). Check in at Admitting.  There are no restrictions prior to this appointment.    You are scheduled for TAVR surgery on Tuesday, January 22, 2017.  [facility] at 8:00 AM for check in. Nothing to eat or drink after midnight. Do not take any medications the morning of surgery.

## 2017-01-16 NOTE — Progress Notes (Signed)
PCP: Roxanne MinsMichael Duran PA-C  Cardiologist:  EKG: 01/11/2017 in EP[facility]  Stress test: 10/22/16 in EPIC  ECHO: 10/22/16 and 01/07/17 in EPIC  Cardiac Cath: 01/11/2017  Chest x-ray:

## 2017-01-16 NOTE — Pre-Procedure Instructions (Signed)
Efraim KaufmannDavid B Livecchi  01/16/2017      [facility] [place], [place] - [address] [place] [id number] Phone: [phone] Fax: [phone]  [facility] [phone] - [place], [place] - [address] [place] [id number] Phone: [phone] Fax: [phone]    Your procedure is scheduled on January 22, 2017.  [facility] at 0800 AM.  Call this number if you have problems the morning of surgery:  [phone]   Remember:  Do not eat food or drink liquids after midnight.  Take these medicines the morning of surgery with A SIP OF WATER levothyroxine (synthroid).  7 days prior to surgery STOP taking any Aspirin (unless otherwise instructed by your surgeon), Aleve, Naproxen, Ibuprofen, Motrin, Advil, Goody's, BC's, all herbal medications, fish oil, and all vitamins  Continue all other medications as instructed by your physician except follow the above medication instructions before surgery   Do not wear jewelry, make-up or nail polish.  Do not wear lotions, powders, or perfumes, or deoderant.  Men may shave face and neck.  Do not bring valuables to the hospital.  [facility] is not responsible for any belongings or valuables.  Contacts, dentures or bridgework may not be worn into surgery.  Leave your suitcase in the car.  After surgery it may be brought to your room.  For patients admitted to the hospital, discharge time will be determined by your treatment team.  Patients discharged the day of surgery will not be allowed to drive home.   Special instructions:   [facility]- Preparing For Surgery  Before surgery, you can play an important role. Because skin is not sterile, your skin needs to be as free of germs as possible. You can reduce the number of germs on your skin by washing with CHG (chlorahexidine gluconate) Soap before surgery.  CHG is an antiseptic  cleaner which kills germs and bonds with the skin to continue killing germs even after washing.  Please do not use if you have an allergy to CHG or antibacterial soaps. If your skin becomes reddened/irritated stop using the CHG.  Do not shave (including legs and underarms) for at least 48 hours prior to first CHG shower. It is OK to shave your face.  Please follow these instructions carefully.   1. Shower the NIGHT BEFORE SURGERY and the MORNING OF SURGERY with CHG.   2. If you chose to wash your hair, wash your hair first as usual with your normal shampoo.  3. After you shampoo, rinse your hair and body thoroughly to remove the shampoo.  4. Use CHG as you would any other liquid soap. You can apply CHG directly to the skin and wash gently with a scrungie or a clean washcloth.   5. Apply the CHG Soap to your body ONLY FROM THE NECK DOWN.  Do not use on open wounds or open sores. Avoid contact with your eyes, ears, mouth and genitals (private parts). Wash Face and genitals (private parts)  with your normal soap.  6. Wash thoroughly, paying special attention to the area where your surgery will be performed.  7. Thoroughly rinse your body with warm water from the neck down.  8. DO NOT shower/wash with your normal soap after using and rinsing off the CHG Soap.  9. Pat yourself dry with a CLEAN TOWEL.  10. Wear  CLEAN PAJAMAS to bed the night before surgery, wear comfortable clothes the morning of surgery  11. Place CLEAN SHEETS on your bed the night of your first shower and DO NOT SLEEP WITH PETS.    Day of Surgery: Do not apply any deodorants/lotions. Please wear clean clothes to the hospital/surgery center.     Please read over the following fact sheets that you were given. Pain Booklet, Coughing and Deep Breathing, MRSA Information and Surgical Site Infection Prevention

## 2017-01-18 ENCOUNTER — Encounter (HOSPITAL_COMMUNITY): Payer: Self-pay

## 2017-01-18 ENCOUNTER — Other Ambulatory Visit: Payer: Self-pay

## 2017-01-18 ENCOUNTER — Encounter (HOSPITAL_COMMUNITY)
Admission: RE | Admit: 2017-01-18 | Discharge: 2017-01-18 | Disposition: A | Discharge status: Home / Self Care | Payer: Medicare HMO | Source: Ambulatory Visit | Attending: Cardiovascular Disease | Admitting: Cardiovascular Disease

## 2017-01-18 ENCOUNTER — Ambulatory Visit (HOSPITAL_COMMUNITY)
Admission: RE | Admit: 2017-01-18 | Discharge: 2017-01-18 | Disposition: A | Discharge status: Home / Self Care | Payer: Medicare HMO | Source: Ambulatory Visit | Attending: Cardiovascular Disease | Admitting: Cardiovascular Disease

## 2017-01-18 DIAGNOSIS — Z79899 Other long term (current) drug therapy: Secondary | ICD-10-CM | POA: Diagnosis not present

## 2017-01-18 DIAGNOSIS — D696 Thrombocytopenia, unspecified: Secondary | ICD-10-CM | POA: Diagnosis not present

## 2017-01-18 DIAGNOSIS — G40909 Epilepsy, unspecified, not intractable, without status epilepticus: Secondary | ICD-10-CM | POA: Diagnosis not present

## 2017-01-18 DIAGNOSIS — I082 Rheumatic disorders of both aortic and tricuspid valves: Secondary | ICD-10-CM | POA: Diagnosis not present

## 2017-01-18 DIAGNOSIS — I272 Pulmonary hypertension, unspecified: Secondary | ICD-10-CM | POA: Insufficient documentation

## 2017-01-18 DIAGNOSIS — E039 Hypothyroidism, unspecified: Secondary | ICD-10-CM | POA: Insufficient documentation

## 2017-01-18 DIAGNOSIS — Z0183 Encounter for blood typing: Secondary | ICD-10-CM | POA: Diagnosis not present

## 2017-01-18 DIAGNOSIS — Z7982 Long term (current) use of aspirin: Secondary | ICD-10-CM | POA: Insufficient documentation

## 2017-01-18 DIAGNOSIS — E7800 Pure hypercholesterolemia, unspecified: Secondary | ICD-10-CM | POA: Insufficient documentation

## 2017-01-18 DIAGNOSIS — Z01812 Encounter for preprocedural laboratory examination: Secondary | ICD-10-CM | POA: Insufficient documentation

## 2017-01-18 DIAGNOSIS — I712 Thoracic aortic aneurysm, without rupture: Secondary | ICD-10-CM | POA: Insufficient documentation

## 2017-01-18 DIAGNOSIS — Z01818 Encounter for other preprocedural examination: Secondary | ICD-10-CM | POA: Insufficient documentation

## 2017-01-18 DIAGNOSIS — N400 Enlarged prostate without lower urinary tract symptoms: Secondary | ICD-10-CM | POA: Insufficient documentation

## 2017-01-18 DIAGNOSIS — K3184 Gastroparesis: Secondary | ICD-10-CM | POA: Insufficient documentation

## 2017-01-18 DIAGNOSIS — I350 Nonrheumatic aortic (valve) stenosis: Secondary | ICD-10-CM

## 2017-01-18 LAB — URINALYSIS, ROUTINE W REFLEX MICROSCOPIC
Bilirubin Urine: NEGATIVE
GLUCOSE, UA: NEGATIVE mg/dL
Hgb urine dipstick: NEGATIVE
Ketones, ur: NEGATIVE mg/dL
LEUKOCYTES UA: NEGATIVE
NITRITE: NEGATIVE
PH: 5 (ref 5.0–8.0)
Protein, ur: NEGATIVE mg/dL
SPECIFIC GRAVITY, URINE: 1.024 (ref 1.005–1.030)

## 2017-01-18 LAB — BLOOD GAS, ARTERIAL
ACID-BASE DEFICIT: 1.6 mmol/L (ref 0.0–2.0)
BICARBONATE: 21.5 mmol/L (ref 20.0–28.0)
DRAWN BY: 470591
O2 SAT: 98.3 %
Patient temperature: 98.6
pCO2 arterial: 30 mmHg — ABNORMAL LOW (ref 32.0–48.0)
pH, Arterial: 7.469 — ABNORMAL HIGH (ref 7.350–7.450)
pO2, Arterial: 115 mmHg — ABNORMAL HIGH (ref 83.0–108.0)

## 2017-01-18 LAB — COMPREHENSIVE METABOLIC PANEL
ALBUMIN: 3.7 g/dL (ref 3.5–5.0)
ALK PHOS: 75 U/L (ref 38–126)
ALT: 11 U/L — AB (ref 17–63)
ANION GAP: 7 (ref 5–15)
AST: 13 U/L — ABNORMAL LOW (ref 15–41)
BILIRUBIN TOTAL: 0.9 mg/dL (ref 0.3–1.2)
BUN: 13 mg/dL (ref 6–20)
CALCIUM: 8.7 mg/dL — AB (ref 8.9–10.3)
CO2: 20 mmol/L — AB (ref 22–32)
CREATININE: 0.79 mg/dL (ref 0.61–1.24)
Chloride: 109 mmol/L (ref 101–111)
GFR calc non Af Amer: >60 mL/min (ref >60)
GLUCOSE: 98 mg/dL (ref 65–99)
Potassium: 3.5 mmol/L (ref 3.5–5.1)
SODIUM: 136 mmol/L (ref 135–145)
TOTAL PROTEIN: 7.1 g/dL (ref 6.5–8.1)

## 2017-01-18 LAB — CBC
HEMATOCRIT: 34 % — AB (ref 39.0–52.0)
HEMOGLOBIN: 10.8 g/dL — AB (ref 13.0–17.0)
MCH: 22.1 pg — AB (ref 26.0–34.0)
MCHC: 31.8 g/dL (ref 30.0–36.0)
MCV: 69.5 fL — ABNORMAL LOW (ref 78.0–100.0)
Platelets: 204 10*3/uL (ref 150–400)
RBC: 4.89 MIL/uL (ref 4.22–5.81)
RDW: 15.3 % (ref 11.5–15.5)
WBC: 7.9 10*3/uL (ref 4.0–10.5)

## 2017-01-18 LAB — TYPE AND SCREEN
ABO/RH(D): O POS
ANTIBODY SCREEN: NEGATIVE

## 2017-01-18 LAB — HEMOGLOBIN A1C
Hgb A1c MFr Bld: 5.6 % (ref 4.8–5.6)
Mean Plasma Glucose: 114.02 mg/dL

## 2017-01-18 LAB — PROTIME-INR
INR: 1.05
PROTHROMBIN TIME: 13.6 s (ref 11.4–15.2)

## 2017-01-18 LAB — ABO/RH: ABO/RH(D): O POS

## 2017-01-18 LAB — SURGICAL PCR SCREEN
MRSA, PCR: NEGATIVE
Staphylococcus aureus: NEGATIVE

## 2017-01-18 LAB — BRAIN NATRIURETIC PEPTIDE: B Natriuretic Peptide: 109 pg/mL — ABNORMAL HIGH (ref 0.0–100.0)

## 2017-01-18 LAB — APTT: APTT: 37 s — AB (ref 24–36)

## 2017-01-18 NOTE — Progress Notes (Signed)
PCP: Roxanne MinsMichael Duran PA-C  Cardiologist: Dr. Tonny BollmanMichael Cooper  EKG: 01/11/2017 in [facility]  Stress test: 10/22/16 in [facility]  ECHO: 10/22/16 and 01/07/17 in [facility]  Cardiac Cath: 01/11/2017  Chest x-ray: pt denies past year, obtained today

## 2017-01-19 ENCOUNTER — Encounter: Payer: Self-pay | Admitting: Surgery

## 2017-01-19 NOTE — Progress Notes (Signed)
Patient ID: Charles Patrick, male   DOB: 1924/04/11, 83 y.o.   MRN: [id number]  [facility]  MULTIDISCIPLINARY HEART VALVE CLINIC  CARDIOTHORACIC SURGERY CONSULTATION REPORT  Referring Provider is [name], Suszanne ConnersMichael R, PA-C PCP is [name], [name], PA-C  Chief Complaint  Patient presents with  . TAVR evaluation    HPI:  The patient is an 83 year old gentleman with hypertension, hyperlipidemia, hypothyroidism, PVD, and aortic stenosis that has been [facility]. Serial echocardiograms have shown progression of his aortic stenosis and the most recent echo there on 09/03/2016 showed a mean transvalvular gradient of 55 mm Hg and an aortic valve area of 0.6 cm2. LVEF was 60-65%. He was seen by [name] in August and denied any symptoms. He had an echo stress test on 10/22/2016 showing critical AS with baseline peak and mean transaortic gradients of 66/39 mm Hg with exercise gradients of 103/58 mm Hg. The aortic valve was severely thickened and calcified with severely restricted leaflet opening. He also had CPX testing the same day which showed no pulmonary limitation with probable mild circulatory limitation. His most recent echo on 01/07/2017 showed a mean gradient of 66 mm Hg and a peak of 110 mm Hg. The ascending aorta was moderately dilated at 46 mm Hg. LVEF was 65-70% with moderate concentric LVH and G1DD. Cardiac cath on 11/16 showed mild non-obstructive CAD with normal right heart pressures. The mean gradient was 48 mm Hg and peak to peak was 69 mm Hg. AVA of 0.88 cm2. He was seen by Dr. [name] and underwent extraction of his remaining teeth and alveoloplasty.  He continues to remain active. He denies any chest discomfort, shortness of breath, dizziness or syncope. He does feel like he   Past Medical History:  Diagnosis Date  . Anemia   . Aortic valve stenosis   . Arthropathy   . BPH associated with nocturia   . Carotid artery disease (HCC)   .  Colon polyp   . Dilated aortic root (HCC)   . Elevated PSA   . Erectile dysfunction   . Gastric paresis   . Hypercholesterolemia   . Hyperglycemia   . Hypertension    Patient denies  . Hypothyroid   . Iron deficiency   . PAD (peripheral artery disease) (HCC)   . Pulmonary hypertension (HCC)   . Seizure disorder (HCC)   . Thoracic aortic aneurysm (HCC)   . Thrombocytopenia (HCC)   . Thyroid nodule   . Tricuspid regurgitation   . Vitamin D deficiency     Past Surgical History:  Procedure Laterality Date  . MULTIPLE EXTRACTIONS WITH ALVEOLOPLASTY N/A 10/17/2016   Procedure: Extraction of tooth #'s 4, 21,22,27,28,29 with alveoloplasty;  Surgeon: [name], [name], DDS;  [facility];  Service: Oral Surgery;  Laterality: N/A;  . NO PAST SURGERIES    . RIGHT/LEFT HEART CATH AND CORONARY ANGIOGRAPHY N/A 01/11/2017   Procedure: RIGHT/LEFT HEART CATH AND CORONARY ANGIOGRAPHY;  Surgeon: [name], [name], MD;  [facility];  Service: Cardiovascular;  Laterality: N/A;    Family History: no hx of aortic valve disease or aortic aneurysm  Social History   Socioeconomic History  . Marital status: Married    Spouse name: Not on file  . Number of children: 3  . Years of education: Not on file  . Highest education level: Not on file  Social Needs  . Financial resource strain: Not on file  .  Food insecurity - worry: Not on file  . Food insecurity - inability: Not on file  . Transportation needs - medical: Not on file  . Transportation needs - non-medical: Not on file  Occupational History  . Not on file  Tobacco Use  . Smoking status: Never Smoker  . Smokeless tobacco: Never Used  Substance and Sexual Activity  . Alcohol use: No  . Drug use: No  . Sexual activity: Not Currently  Other Topics Concern  . Not on file  Social History Narrative  . Not on file    Current Outpatient Medications  Medication Sig Dispense Refill  . aspirin EC 81 MG tablet Take 81 mg  by mouth daily.     Marland Kitchen levothyroxine (SYNTHROID, LEVOTHROID) 50 MCG tablet Take 50 mcg by mouth daily before breakfast.     . simvastatin (ZOCOR) 40 MG tablet Take 40 mg by mouth daily at 6 PM.     . tamsulosin (FLOMAX) 0.4 MG CAPS capsule Take 0.4 mg by mouth daily after supper.      No current facility-administered medications for this visit.     No Known Allergies    Review of Systems:   General:  normal appetite, decreased energy, no weight gain, no weight loss, no fever  Cardiac:  no chest pain with exertion, no chest pain at rest, no SOB with  exertion, no resting SOB, no PND, no orthopnea, no palpitations, no arrhythmia, no atrial fibrillation, no LE edema, no dizzy spells, no syncope  Respiratory:  no shortness of breath, no home oxygen, no productive cough, no dry cough, no bronchitis, no wheezing, no hemoptysis, no asthma, no pain with inspiration or cough, no sleep apnea, no CPAP at night  GI:   no difficulty swallowing, no reflux, no frequent heartburn, no hiatal hernia, no abdominal pain, no constipation, no diarrhea, no hematochezia, no hematemesis, no melena  GU:   no dysuria,  no frequency, no urinary tract infection, no hematuria, no enlarged prostate, no kidney stones, no kidney disease  Vascular:  no pain suggestive of claudication, no pain in feet, no leg cramps, no varicose veins, no DVT, no non-healing foot ulcer  Neuro:   no stroke, no TIA's, no seizures, no headaches, no temporary blindness one eye, blind in right eye,  no slurred speech, no peripheral neuropathy, no chronic pain, no instability of gait, no memory/cognitive dysfunction  Musculoskeletal: no arthritis, no joint swelling, no myalgias, no difficulty walking, normal mobility   Skin:   no rash, no itching, no skin infections, no pressure sores or ulcerations  Psych:   no anxiety, no depression, no nervousness, no unusual recent stress  Eyes:   no blurry vision, no floaters, no recent vision changes,  wears  glasses   ENT:   no hearing loss, had loose or painful teeth, partial dentures, last saw dentist in August and had extraction of remaining teeth.  Hematologic:  no easy bruising, no abnormal bleeding, no clotting disorder, no frequent epistaxis  Endocrine:  no diabetes, does not check CBG's at home    Physical Exam:   BP 103/69   Pulse 76   Ht 6\' 4"  (1.93 m)   Wt 177 lb (80.3 kg)   SpO2 98%   BMI 21.55 kg/m   General:  Elderly but  well-appearing  HEENT:  [place]/AT, right cornea completely hazy, EOMI, oropharynx clear  Neck:   no JVD, no bruits, no adenopathy or thyromegaly  Chest:   clear to auscultation, symmetrical breath  sounds, no wheezes, no rhonchi   CV:   RRR, grade IV/VI crescendo/decrescendo murmur heard best at RSB,  no diastolic murmur  Abdomen:  soft, non-tender, no masses or organomegaly  Extremities:  warm, well-perfused, pulses palpable in feet, no LE edema  Rectal/GU  Deferred  Neuro:   Grossly non-focal and symmetrical throughout  Skin:   Clean and dry, no rashes, no breakdown   Diagnostic Tests:   [facility]                        [address]                        [place], [place] [address]                            [phone]  ------------------------------------------------------------------- Echocardiography  Patient:    Lesley, Galentine MR #:       [id number] Study Date: 01/07/2017 Gender:     M Age:        25 Height:     193 cm Weight:     80.3 kg BSA:        2.07 m^2 Pt. Status: Room:   ATTENDING    Tressie Stalker, M.D.  ORDERING     Tressie Stalker, M.D.  REFERRING    Tressie Stalker, M.D.  SONOGRAPHER  Dewitt Hoes, RDCS  PERFORMING   [facility], Outpatient  cc:  ------------------------------------------------------------------- LV EF: 65% -   70%  ------------------------------------------------------------------- Indications:      Aortic stenosis  (I35).  ------------------------------------------------------------------- History:   PMH:   Coronary artery disease.  Aortic valve disease. Risk factors:  PAD. Pulmonary hypertension. Hypertension. Dyslipidemia.  ------------------------------------------------------------------- Study Conclusions  - Left ventricle: The cavity size was normal. There was moderate   concentric hypertrophy. Systolic function was vigorous. The   estimated ejection fraction was in the range of 65% to 70%. Wall   motion was normal; there were no regional wall motion   abnormalities. Doppler parameters are consistent with abnormal   left ventricular relaxation (grade 1 diastolic dysfunction).   Doppler parameters are consistent with elevated ventricular   end-diastolic filling pressure. - Aortic valve: There was mild regurgitation. Mean gradient (S): 66   mm Hg. Peak gradient (S): 110 mm Hg. - Ascending aorta: The ascending aorta was moderately dilated   measuring 46 mm. - Right ventricle: Systolic function was normal. - Right atrium: The atrium was moderately dilated. - Tricuspid valve: There was moderate regurgitation. - Pulmonic valve: There was mild regurgitation. - Pulmonary arteries: Systolic pressure was moderately increased.   PA peak pressure: 56 mm Hg (S). - Inferior vena cava: The vessel was normal in size. - Pericardium, extracardiac: There was no pericardial effusion.  Impressions:  - Hyperdynamic LVEF. Critical aortic stenosis.  ------------------------------------------------------------------- Labs, prior tests, procedures, and surgery: Stress echocardiography (10/22/2016).  ------------------------------------------------------------------- Study data:  Comparison was made to the study of 10/22/2016.  Study status:  Routine.  Procedure:  The patient reported no pain pre or post test. Transthoracic echocardiography. Image quality was adequate.  Study completion:  There  were no complications. Echocardiography.  M-mode, complete 2D, 3D, spectral Doppler, and color Doppler.  Birthdate:  Patient birthdate: 1924/07/05.  Age: Patient is 83 yr old.  Sex:  Gender: male.    BMI: 21.6 kg/m^2. Blood pressure:     115/73  Patient status:  Outpatient.  Study date:  Study date: 01/07/2017. Study time: 10:40 AM.  Location: [facility]  -------------------------------------------------------------------  ------------------------------------------------------------------- Left ventricle:  The cavity size was normal. There was moderate concentric hypertrophy. Systolic function was vigorous. The estimated ejection fraction was in the range of 65% to 70%. Wall motion was normal; there were no regional wall motion abnormalities. Doppler parameters are consistent with abnormal left ventricular relaxation (grade 1 diastolic dysfunction). Doppler parameters are consistent with elevated ventricular end-diastolic filling pressure.  ------------------------------------------------------------------- Aortic valve:   Trileaflet; severely thickened, severely calcified leaflets. Mobility was not restricted.  Doppler:  Transvalvular velocity was within the normal range. There was no stenosis. There was mild regurgitation.    VTI ratio of LVOT to aortic valve: 0.3. Valve area (VTI): 0.94 cm^2. Indexed valve area (VTI): 0.45 cm^2/m^2. Peak velocity ratio of LVOT to aortic valve: 0.24. Valve area (Vmax): 0.77 cm^2. Indexed valve area (Vmax): 0.37 cm^2/m^2. Mean velocity ratio of LVOT to aortic valve: 0.27. Valve area (Vmean): 0.86 cm^2. Indexed valve area (Vmean): 0.42 cm^2/m^2. Mean gradient (S): 66 mm Hg. Peak gradient (S): 110 mm Hg.  ------------------------------------------------------------------- Aorta:  Aortic root: The aortic root was normal in size. Ascending aorta: The ascending aorta was moderately dilated measuring 46  mm.  ------------------------------------------------------------------- Mitral valve:   Structurally normal valve.   Mobility was not restricted.  Doppler:  Transvalvular velocity was within the normal range. There was no evidence for stenosis. There was no regurgitation.    Peak gradient (D): 2 mm Hg.  ------------------------------------------------------------------- Left atrium:  The atrium was normal in size.  ------------------------------------------------------------------- Right ventricle:  The cavity size was normal. Wall thickness was normal. Systolic function was normal.  ------------------------------------------------------------------- Pulmonic valve:    Structurally normal valve.   Cusp separation was normal.  Doppler:  Transvalvular velocity was within the normal range. There was no evidence for stenosis. There was mild regurgitation.  ------------------------------------------------------------------- Tricuspid valve:   Structurally normal valve.    Doppler: Transvalvular velocity was within the normal range. There was moderate regurgitation.  ------------------------------------------------------------------- Pulmonary artery:   The main pulmonary artery was normal-sized. Systolic pressure was moderately increased.  ------------------------------------------------------------------- Right atrium:  The atrium was moderately dilated.  ------------------------------------------------------------------- Pericardium:  There was no pericardial effusion.  ------------------------------------------------------------------- Systemic veins: Inferior vena cava: The vessel was normal in size.  ------------------------------------------------------------------- Measurements   Left ventricle                            Value          Reference  LV ID, ED, PLAX chordal           (L)     38    mm       43 - 52  LV ID, ES, PLAX chordal                   27.1   mm       23 - 38  LV fx shortening, PLAX chordal            29    %        >=29  LV PW thickness, ED                       11    mm       ---------  IVS/LV PW ratio, ED               (  H)     1.45           <=1.3  Stroke volume, 2D                         101   ml       ---------  Stroke volume/bsa, 2D                     49    ml/m^2   ---------  LV e&', lateral                            4.35  cm/s     ---------  LV E/e&', lateral                          17.61          ---------  LV e&', medial                             4.46  cm/s     ---------  LV E/e&', medial                           17.17          ---------  LV e&', average                            4.41  cm/s     ---------  LV E/e&', average                          17.39          ---------    Ventricular septum                        Value          Reference  IVS thickness, ED                         15.9  mm       ---------    LVOT                                      Value          Reference  LVOT ID, S                                20    mm       ---------  LVOT area                                 3.14  cm^2     ---------  LVOT peak velocity, S                     122   cm/s     ---------  LVOT mean velocity, S  98.9  cm/s     ---------  LVOT VTI, S                               32.3  cm       ---------  LVOT peak gradient, S                     6     mm Hg    ---------    Aortic valve                              Value          Reference  Aortic valve peak velocity, S             498   cm/s     ---------  Aortic valve mean velocity, S             360   cm/s     ---------  Aortic valve VTI, S                       108   cm       ---------  Aortic mean gradient, S                   61    mm Hg    ---------  Aortic peak gradient, S                   99    mm Hg    ---------  VTI ratio, LVOT/AV                        0.3            ---------  Aortic valve area, VTI                    0.94  cm^2      ---------  Aortic valve area/bsa, VTI                0.45  cm^2/m^2 ---------  Velocity ratio, peak, LVOT/AV             0.24           ---------  Aortic valve area, peak velocity          0.77  cm^2     ---------  Aortic valve area/bsa, peak               0.37  cm^2/m^2 ---------  velocity  Velocity ratio, mean, LVOT/AV             0.27           ---------  Aortic valve area, mean velocity          0.86  cm^2     ---------  Aortic valve area/bsa, mean               0.42  cm^2/m^2 ---------  velocity    Aorta                                     Value          Reference  Aortic root ID, ED  40    mm       ---------  Ascending aorta ID, A-P, S                45    mm       ---------    Left atrium                               Value          Reference  LA ID, A-P, ES                            41    mm       ---------  LA ID/bsa, A-P                            1.98  cm/m^2   <=2.2  LA volume, S                              43.6  ml       ---------  LA volume/bsa, S                          21.1  ml/m^2   ---------  LA volume, ES, 1-p A4C                    26.6  ml       ---------  LA volume/bsa, ES, 1-p A4C                12.9  ml/m^2   ---------  LA volume, ES, 1-p A2C                    60.6  ml       ---------  LA volume/bsa, ES, 1-p A2C                29.3  ml/m^2   ---------    Mitral valve                              Value          Reference  Mitral E-wave peak velocity               76.6  cm/s     ---------  Mitral A-wave peak velocity               111   cm/s     ---------  Mitral deceleration time          (H)     327   ms       150 - 230  Mitral peak gradient, D                   2     mm Hg    ---------  Mitral E/A ratio, peak                    0.7            ---------    Pulmonary arteries  Value          Reference  PA pressure, S, DP                (H)     56    mm Hg    <=30    Tricuspid valve                            Value          Reference  Tricuspid regurg peak velocity            347   cm/s     ---------  Tricuspid peak RV-RA gradient             48    mm Hg    ---------    Systemic veins                            Value          Reference  Estimated CVP                             8     mm Hg    ---------    Right ventricle                           Value          Reference  TAPSE                                     31.3  mm       ---------  RV pressure, S, DP                (H)     56    mm Hg    <=30  RV s&', lateral, S                         15    cm/s     ---------    Pulmonic valve                            Value          Reference  Pulmonic regurg velocity, ED              147   cm/s     ---------  Pulmonic regurg gradient, ED              9     mm Hg    ---------  Legend: (L)  and  (H)  mark values outside specified reference range.  ------------------------------------------------------------------- Prepared and Electronically Authenticated by  Tobias Alexander, M.D. 2018-11-12T11:46:06  Physicians   Panel Physicians Referring Physician Case Authorizing Physician  Tonny Bollman, MD (Primary)    Procedures   RIGHT/LEFT HEART CATH AND CORONARY ANGIOGRAPHY  Conclusion     There is severe aortic valve stenosis.   1.  Diffuse coronary ectasia with mild nonobstructive CAD 2.  Normal right heart hemodynamics 3.  Severely calcified aortic valve with hemodynamic findings consistent with severe aortic stenosis  Peak to peak transaortic gradient is 69 mmHg, mean gradient 48 mmHg, aortic  valve area 0.88 cm   Indications   Severe aortic stenosis [I35.0 (ICD-10-CM)]  Procedural Details/Technique   Technical Details INDICATION: Severe aortic stenosis  PROCEDURAL DETAILS: There was an indwelling IV in a right antecubital vein. Using normal sterile technique, the IV was changed out for a 5 Fr brachial sheath over a 0.018 inch wire. The right wrist was then prepped, draped,  and anesthetized with 1% lidocaine. Using the modified Seldinger technique a 5/6 French Slender sheath was placed in the right radial artery. Intra-arterial verapamil was administered through the radial artery sheath. IV heparin was administered after a JR4 catheter was advanced into the central aorta. The radial artery transitioned into the brachial artery and a large angulation. In order to navigate this, a 0.014 inch coronary guidewire was used to direct the catheter across the vessel. Coronary angiography was challenging because of the patient's dilated aortic root and subclavian tortuosity. The left coronary artery was ultimately imaged with a JL 5 catheter. The right coronary artery was imaged with a 6 [place] JR 5 catheter. The aortic valve was crossed and a pullback gradient was recorded. A Swan-Ganz catheter was used for the right heart catheterization. Standard protocol was followed for recording of right heart pressures and sampling of oxygen saturations. Fick cardiac output was calculated. Standard Judkins catheters were used for selective coronary angiography and left ventriculography. There were no immediate procedural complications. The patient was transferred to the post catheterization recovery area for further monitoring.     Estimated blood loss <50 mL.  During this procedure the patient was administered the following to achieve and maintain moderate conscious sedation: Versed 2 mg, Fentanyl 50 mcg, while the patient's heart rate, blood pressure, and oxygen saturation were continuously monitored. The period of conscious sedation was 58 minutes, of which I was present face-to-face 100% of this time.  Coronary Findings   Diagnostic  Dominance: Right  Left Main  The vessel exhibits minimal luminal irregularities.  Left Anterior Descending  There is mild diffuse disease throughout the vessel. The vessel is ectatic. The proximal LAD is ectatic. The mid LAD has mild nonobstructive stenosis  estimated at 30%. The first diagonal branch trifurcates into 3 subbranches and has only minimal irregularity  Left Circumflex  The vessel exhibits minimal luminal irregularities.  First Obtuse Marginal Branch  The vessel exhibits minimal luminal irregularities.  Right Coronary Artery  Vessel is large. There is mild diffuse disease throughout the vessel. The vessel is ectatic. The RCA is a large, dominant vessel with diffuse irregularity and ectasia. There are no significant stenoses. The vessel terminates in a PDA and PLA branch.  Intervention   No interventions have been documented.  Left Heart   Aortic Valve There is severe aortic valve stenosis. The aortic valve is calcified. There is restricted aortic valve motion.  Coronary Diagrams   Diagnostic Diagram       Implants     No implant documentation for this case.  MERGE Images   Show images for CARDIAC CATHETERIZATION   Link to Procedure Log   Procedure Log    Hemo Data    Most Recent Value  Fick Cardiac Output 5.1 L/min  Fick Cardiac Output Index 2.43 (L/min)/BSA  Aortic Mean Gradient 37.7 mmHg  Aortic Peak Gradient 50 mmHg  Aortic Valve Area 1.02  Aortic Value Area Index 0.48 cm2/BSA  RA A Wave 9 mmHg  RA V Wave 5 mmHg  RA Mean 2 mmHg  RV Systolic Pressure 25 mmHg  RV Diastolic  Pressure 1 mmHg  RV EDP 5 mmHg  PA Systolic Pressure 27 mmHg  PA Diastolic Pressure 9 mmHg  PA Mean 15 mmHg  PW A Wave 12 mmHg  PW V Wave 14 mmHg  PW Mean 11 mmHg  AO Systolic Pressure 93 mmHg  AO Diastolic Pressure 50 mmHg  AO Mean 72 mmHg  LV Systolic Pressure 163 mmHg  LV Diastolic Pressure 1 mmHg  LV EDP 10 mmHg  Arterial Occlusion Pressure Extended Systolic Pressure 101 mmHg  Arterial Occlusion Pressure Extended Diastolic Pressure 52 mmHg  Arterial Occlusion Pressure Extended Mean Pressure 77 mmHg  Left Ventricular Apex Extended Systolic Pressure 94 mmHg  Left Ventricular Apex Extended Diastolic Pressure 50 mmHg  Left  Ventricular Apex Extended EDP Pressure 52 mmHg  QP/QS 1  TPVR Index 6.17 HRUI  TSVR Index 29.64 HRUI  PVR SVR Ratio 0.06  TPVR/TSVR Ratio 0.21    ADDENDUM REPORT: 10/25/2016 13:29  CLINICAL DATA:  Aorticstenosis  EXAM: Cardiac TAVR CT  TECHNIQUE: The patient was scanned on a Siemens 192 slice scanner. A 120 kV retrospective scan was triggered in the ascending thoracic aorta at 140 HU's. Gantry rotation speed was 250 msecs and collimation was .6 mm. Lopressor 5 mg given the 3D data set was reconstructed in 5% intervals of the R-R cycle. Systolic and diastolic phases were analyzed on a dedicated work station using MPR, MIP and VRT modes. The patient received 80 cc of contrast.  FINDINGS: Aortic Valve: Tri leaflet severely calcified valve with restricted leaflet motion  Aorta: Moderate aortic root dilatation. Significant angulation of the mid aortic arch after the take off of the left subclavian. Marked tortuosity of the descending thoracic aorta  Sinotubular Junction:  34.6 mm  Ascending Thoracic Aorta:  46 mm  Aortic Arch:  39 mm  Descending Thoracic Aorta:  30 mm  Sinus of Valsalva Measurements:  Non-coronary:  39 mm  Right -coronary:  36.9 mm  Left -coronary:  37.5 mm  Coronary Artery Height above Annulus:  Left Main:  15.2 mm above annulus  Right Coronary:  20.5 mm above annulus  Virtual Basal Annulus Measurements:  Maximum/Minimum Diameter:  28.2 mm x 19.6 mm elliptical  Perimeter:  81.9 mm  Area:  458 mm2  Coronary Arteries:  Sufficient height above annulus for deployment  Optimum Fluoroscopic Angle for Delivery: LAO 2 degrees Caudal 23 degrees  IMPRESSION: 1) Severely calcified tri leaflet aortic valve with annular area 458 mm2 perimeter 81.9 mm. Suitable for a 26 mm Sapien 3 valve Although diameters smaller and annulus elliptical sinus and perimeter  Measurements suggest may be suitable for a 34 mm Evolut R  valve  2) Moderate ascending aortic root dilatation 46 mm with marked angulation of the mid arch and marked tortuosity of the descending thoracic aorta which may make delivery challenging  3) Sufficient coronary height for deployment  4) Optimum angle for deployment LAO 2 degrees Caudal 23 degrees  5) No LAA thrombus  Charlton Haws   Electronically Signed   By: Charlton Haws M.D.   On: 10/25/2016 13:29   Addended by Wendall Stade, MD on 10/25/2016 1:32 PM    Study Result   EXAM: OVER-READ INTERPRETATION  CT CHEST  The following report is an over-read performed by radiologist Dr. Royal Piedra [facility] on 10/25/2016. This over-read does not include interpretation of cardiac or coronary anatomy or pathology. The coronary calcium score/coronary CTA interpretation by the cardiologist is attached.  COMPARISON:  None.  FINDINGS:  Relevant extracardiac findings will be described under separate dictation for contemporaneously obtained CTA of the chest, abdomen and pelvis.  IMPRESSION: Please see separate dictation for contemporaneously obtained CTA of the chest, abdomen and pelvis 10/25/2016 for full description of relevant extracardiac findings.  Electronically Signed: By: Trudie Reed M.D. On: 10/25/2016 11:17      CLINICAL DATA:  83 year old male with history of severe aortic stenosis. Preprocedural study prior to potential transcatheter aortic valve replacement (TAVR) procedure.  EXAM: CT ANGIOGRAPHY CHEST, ABDOMEN AND PELVIS  TECHNIQUE: Multidetector CT imaging through the chest, abdomen and pelvis was performed using the standard protocol during bolus administration of intravenous contrast. Multiplanar reconstructed images and MIPs were obtained and reviewed to evaluate the vascular anatomy.  CONTRAST:  100 mL of Isovue 370.  COMPARISON:  None.  FINDINGS: CTA CHEST FINDINGS  Cardiovascular: Heart size is  borderline enlarged. There is no significant pericardial fluid, thickening or pericardial calcification. There is aortic atherosclerosis, as well as atherosclerosis of the great vessels of the mediastinum and the coronary arteries, including calcified atherosclerotic plaque in the left anterior descending, left circumflex and right coronary arteries. Severe thickening calcification of the aortic valve. Mild aneurysmal dilatation of the ascending thoracic aorta which measures up to 4.6 cm in diameter. Distal aortic arch is also ectatic measuring 3.9 cm in diameter.  Mediastinum/Lymph Nodes: No pathologically enlarged mediastinal or hilar lymph nodes. Esophagus is unremarkable in appearance. No axillary lymphadenopathy.  Lungs/Pleura: 4 mm subpleural nodule in the right middle lobe associated with the minor fissure (axial image 63 of series 7), statistically likely a benign subpleural lymph node. Areas of mild scarring in the lower lobes of lungs bilaterally. No acute consolidative airspace disease. No pleural effusions.  Musculoskeletal/Soft Tissues: Several old healed left-sided rib fractures. There are no aggressive appearing lytic or blastic lesions noted in the visualized portions of the skeleton.  CTA ABDOMEN AND PELVIS FINDINGS  Hepatobiliary: Again noted are multiple low-attenuation lesions scattered throughout the liver, similar in size, number and distribution to the prior study. The largest of these lesions are compatible with simple cysts, measuring up to 2 cm in diameter in segment 4B. The smaller lesions are too small to definitively characterize, but are presumably small cysts or biliary hamartomas. No intra or extrahepatic biliary ductal dilatation. Gallbladder is normal in appearance.  Pancreas: No pancreatic mass. No pancreatic ductal dilatation. No pancreatic or peripancreatic fluid or inflammatory changes.  Spleen: Unremarkable.  Adrenals/Urinary  Tract: Subcentimeter low-attenuation lesion in the posterior aspect of the interpolar region of the right kidney is too small to definitively characterize, but similar to the prior study, likely a tiny cyst. Left kidney and bilateral adrenal glands are normal in appearance. No hydroureteronephrosis. Urinary bladder is normal in appearance.  Stomach/Bowel: The appearance of the stomach is normal. No pathologic dilatation of small bowel or colon. Diffuse mural thickening extending from the rectum to the hepatic flexure of the colon, which could indicate underlying colitis. Normal appendix.  Vascular/Lymphatic: Aortic atherosclerosis, with vascular findings and measurements pertinent to potential TAVR procedure, as detailed below. Aneurysmal dilatation of the left common iliac artery which measures up to 21 mm in diameter. 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 Inferior  mesenteric artery is widely patent. Two renal arteries are present bilaterally. On the right, there is a very tiny branch extending to the lower pole and a dominant main right renal artery. On the left, there are 2 equal sized renal artery branches. All of these renal arteries appear widely patent without hemodynamically significant stenosis. No lymphadenopathy noted in the abdomen or pelvis.  Reproductive: Prostate gland and seminal vesicles are unremarkable in appearance.  Other: No significant volume of ascites.  No pneumoperitoneum.  Musculoskeletal: Multiple old healed fractures in the pelvis, including the left superior and inferior pubic rami, and an old fracture of the right ilium extending into the right  acetabulum. There are no aggressive appearing lytic or blastic lesions noted in the visualized portions of the skeleton.  VASCULAR MEASUREMENTS PERTINENT TO TAVR:  AORTA:  Minimal Aortic Diameter -  19 x 21 mm  Severity of Aortic Calcification - mild to moderate  RIGHT PELVIS:  Right Common Iliac Artery -  Minimal Diameter - 13.6 x 12.3 mm  Tortuosity - moderate  Calcification - mild  Right External Iliac Artery -  Minimal Diameter - 11.4 x 11.6 mm  Tortuosity - severe  Calcification - none  Right Common Femoral Artery -  Minimal Diameter - 12.2 x 12.3 mm  Tortuosity - mild  Calcification - none  LEFT PELVIS:  Left Common Iliac Artery -  Minimal Diameter - 18.3 x 13.2 mm  Tortuosity - severe  Calcification - moderate  Left External Iliac Artery -  Minimal Diameter - 12.2 x 11.9 mm  Tortuosity - moderate to severe  Calcification - mild  Left Common Femoral Artery -  Minimal Diameter - 13.8 x 12.8 mm  Tortuosity - mild  Calcification - mild  Review of the MIP images confirms the above findings.  IMPRESSION: 1. Vascular findings and measurements pertinent to potential T AVR procedure, as detailed above. This patient does appear to have suitable pelvic arterial access bilaterally. 2. Severe thickening calcification of the aortic valve, compatible with the reported clinical history of severe aortic stenosis. 3. Aortic atherosclerosis, in addition to three-vessel coronary artery disease. Please note that although the presence of coronary artery calcium documents the presence of coronary artery disease, the severity of this disease and any potential stenosis cannot be assessed on this non-gated CT examination. Assessment for potential risk factor modification, dietary therapy or pharmacologic therapy may be warranted, if clinically indicated. 4. 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 Recommend semi-annual imaging followup by CTA or MRA and referral to cardiothoracic surgery if not already obtained. This recommendation follows 2010 ACCF/AHA/AATS/ACR/ASA/SCA/SCAI/SIR/STS/SVM Guidelines for the Diagnosis and Management of Patients With Thoracic Aortic Disease. Circulation. 2010; 121: [id number]. 5. Mild diffuse rectal and colonic wall thickening extending from the rectum to the level of the hepatic flexure of the colon, suggesting underlying colitis. 6. Additional incidental findings, as above. Aortic Atherosclerosis (ICD10-I70.0) and Aortic aneurysm NOS (ICD10-I71.9).   Electronically Signed   By: Trudie Reedaniel  Entrikin M.D.   On: 10/25/2016 12:28  STS SCORE    Procedure: Isolated AVR   Risk of Mortality: 2.838%  Renal Failure: 2.790%  Permanent Stroke:2.957%  Prolonged Ventilation: 8.345%  DSW Infection:0.046%  Reoperation: 6.501%  Morbidity or Mortality: 15.663%   Short Length of Stay: 20.925%  Long Length of Stay: 8.741%    Impression:  This 83  year old patient has early stage D severe, symptomatic aortic stenosis. He denies any symptoms but says that he may be slowing down a little and his CPX test shows moderate circulator limitation with a significantly elevated VE/VCO2 slope. I have personally reviewed his echo, cath and CTA studies. His echo shows a trileaflet aortic valve with severely thickened and calcified valve leaflets with a mean gradient of 66 mm Hg and a peak of 110 mm Hg. LVEF is normal. Cath shows no significant coronary artery disease. The mean gradient was 48 mm Hg with a peak to peak of 89 mm Hg. I agree that AVR is indicated in this patient with critical AS. I think he has mild symptoms but may go downhill quickly and is still active for his age. He would be at least moderate risk for open  surgical AVR given his age and ascending aortic dilation of 46 mm which may complicate open valve replacement. I think TAVR is a better option for him. His gated cardiac CTA shows anatomy suitable for TAVR without any significant complicating features. His abdominal and pelvic CTA shows suitable pelvic vasculature for transfemoral insertion.   The patient was counseled at length regarding treatment alternatives for management of severe symptomatic aortic stenosis. The risks and benefits of surgical intervention has been discussed in detail. Long-term prognosis with medical therapy was discussed. Alternative approaches such as conventional surgical aortic valve replacement, transcatheter aortic valve replacement, and palliative medical therapy were compared and contrasted at length. This discussion was placed in the context of the patient's own specific clinical presentation and past medical history. All of his questions have been addressed.    Following the decision to proceed with transcatheter aortic valve replacement, a discussion was held regarding what types of management strategies would be attempted intraoperatively in the event of life-threatening complications, including whether or not the patient would be considered a candidate for the use of cardiopulmonary bypass and/or conversion to open sternotomy for attempted surgical intervention.The patient has been advised of a variety of complications that might develop including but not limited to risks of death, stroke, paravalvular leak, aortic dissection or other major vascular complications, aortic annulus rupture, device embolization, cardiac rupture or perforation, mitral regurgitation, acute myocardial infarction, arrhythmia, heart block or bradycardia requiring permanent pacemaker placement, congestive heart failure, respiratory failure, renal failure, pneumonia, infection, other late complications related to structural valve deterioration or  migration, or other complications that might ultimately cause a temporary or permanent loss of functional independence or other long term morbidity. The patient provides full informed consent for the procedure as described and all questions were answered.    Plan:  Transfemoral TAVR on Tuesday 01/22/2017.   I spent 60 minutes performing this consultation and > 50% of this time was spent face to face counseling and coordinating the care of this patient's severe aortic stenosis.    Alleen Borne, MD 01/15/2017

## 2017-01-21 MED ORDER — DOPAMINE-DEXTROSE 3.2-5 MG/ML-% IV SOLN
0.0000 ug/kg/min | INTRAVENOUS | Status: DC
  Filled 2017-01-21: qty 250

## 2017-01-21 MED ORDER — DEXTROSE 5 % IV SOLN
1.5000 g | INTRAVENOUS | Status: AC
  Administered 2017-01-22: 1.5 g via INTRAVENOUS
  Filled 2017-01-21 (×2): qty 1.5

## 2017-01-21 MED ORDER — SODIUM CHLORIDE 0.9 % IV SOLN
INTRAVENOUS | Status: DC
  Filled 2017-01-21: qty 30

## 2017-01-21 MED ORDER — EPINEPHRINE PF 1 MG/ML IJ SOLN
0.0000 ug/min | INTRAVENOUS | Status: DC
  Filled 2017-01-21: qty 4

## 2017-01-21 MED ORDER — MAGNESIUM SULFATE 50 % IJ SOLN
40.0000 meq | INTRAMUSCULAR | Status: DC
  Filled 2017-01-21: qty 9.85

## 2017-01-21 MED ORDER — VANCOMYCIN HCL 10 G IV SOLR
1250.0000 mg | INTRAVENOUS | Status: AC
  Administered 2017-01-22: 1250 mg via INTRAVENOUS
  Filled 2017-01-21: qty 1250

## 2017-01-21 MED ORDER — NOREPINEPHRINE BITARTRATE 1 MG/ML IV SOLN
0.0000 ug/min | INTRAVENOUS | Status: AC
  Administered 2017-01-22: 2 ug/min via INTRAVENOUS
  Filled 2017-01-21: qty 4

## 2017-01-21 MED ORDER — POTASSIUM CHLORIDE 2 MEQ/ML IV SOLN
80.0000 meq | INTRAVENOUS | Status: DC
  Filled 2017-01-21: qty 40

## 2017-01-21 MED ORDER — SODIUM CHLORIDE 0.9 % IV SOLN
30.0000 ug/min | INTRAVENOUS | Status: DC
  Filled 2017-01-21: qty 2

## 2017-01-21 MED ORDER — SODIUM CHLORIDE 0.9 % IV SOLN
INTRAVENOUS | Status: DC
  Filled 2017-01-21: qty 1

## 2017-01-21 MED ORDER — DEXMEDETOMIDINE HCL IN NACL 400 MCG/100ML IV SOLN
0.1000 ug/kg/h | INTRAVENOUS | Status: AC
  Administered 2017-01-22: .8 ug/kg/h via INTRAVENOUS
  Filled 2017-01-21: qty 100

## 2017-01-21 MED ORDER — NITROGLYCERIN IN D5W 200-5 MCG/ML-% IV SOLN
2.0000 ug/min | INTRAVENOUS | Status: DC
  Filled 2017-01-21: qty 250

## 2017-01-21 NOTE — H&P (Signed)
[address]       [place] [address]             [phone]          CARDIOTHORACIC SURGERY HISTORY AND PHYSICAL EXAM  Referring Provider is [name], PA-C  PCP is [name], PA-C      Chief Complaint  Patient presents with  . Aortic Stenosis    Surgical eval, ECHO 09/03/16 @ [facility]     HPI:  Patient is an 83 year old African-American male with history of aortic stenosis, hypertension, hyperlipidemia, peripheral vascular disease, and hypothyroidism with been referred for surgical consultation to discuss management options for treatment of severe aortic stenosis. The patient states that he was first noted have a heart murmur on physical exam many years ago he has been followed regularly ever since at [facility] in [place], for the last several years by Arnette Felts.  He has remained physically active despite his advanced age, but serial echocardiograms have documented significant progression of disease. Most recent follow-up echocardiogram performed 09/03/2016 revealed velocity across the aortic valve measured as high as 4.5 m/s corresponding to mean transvalvular gradient estimated 54.9 mmHg and aortic valve area calculated 0.6 cm. Left ventricular systolic function remained normal with ejection fraction estimated 60-65%. The patient was referred for surgical consultation.  The patient is married and lives locally in [place] with his wife.  He is a bit of a difficult historian and wonders off topic frequently. However, he seems to have no significant memory problems. He is here alone for this consultation today. 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 He does admit that over the last 2 or 3 years he has noticed a tendency to get fatigued more easily than he had in the past. However, he specifically denies any symptoms of exertional shortness of breath or  chest discomfort. He has not had any tachycardia palpitations or dizzy spells. He denies any history of PND, orthopnea, or lower extremity edema. Appetite is good and his weight is stable.  Patient is an 83 year old African-American male with history of severe aortic stenosis, hypertension, hyperlipidemia, peripheral vascular disease, and hypothyroidism who returns to the office today for follow-up of severe aortic stenosis.  He was originally seen in consultation on October 08, 2016.  At that time the patient remained remarkably active for a gentleman his age and reported absolutely no symptoms of exertional shortness of breath or chest discomfort.  He underwent formal cardiopulmonary exercise testing that revealed remarkably mild circulatory impairment in his exercise tolerance.  Stress echocardiography confirmed the presence of severe aortic stenosis with peak velocity across the aortic valve increased from 4.2 m/s at rest to greater than 5 m/s with exercise.  Left ventricular function was normal and the patient did not experience significant decline in blood pressure with exercise.  At that time the patient remains somewhat reluctant to consider any type of elective surgical intervention.  He was referred for dental service consultation and underwent uncomplicated dental extraction by Dr. Robin Searing.  He was last seen in follow-up here on November 05, 2016.  Since then he has been seen in follow-up by Arnette Felts and he returns to our office today for follow-up.  He underwent repeat transthoracic echocardiogram earlier today demonstrating further progression and severity of disease with peak velocity across the aortic valve at rest measured 5 m/s corresponding to a  mean transvalvular gradient estimated 61 mmHg.  Left ventricular systolic function remain normal and the DVI was reported 0.24.  The patient reports that he continues to do very well.  He specifically denies any symptoms of exertional shortness of  breath or chest discomfort, although he states that he has been slowing down a bit recently.  He remains remarkably active physically and has not had any significant problems or limitations, but he does feel like he may be slowing down a bit.  He has not had any dizzy spells or syncope.   Past Medical History:  Diagnosis Date  . Anemia   . Aortic valve stenosis   . Arthropathy   . BPH associated with nocturia   . Carotid artery disease (HCC)   . Colon polyp   . Dilated aortic root (HCC)   . Elevated PSA   . Erectile dysfunction   . Gastric paresis   . Hypercholesterolemia   . Hyperglycemia   . Hypertension    Patient denies  . Hypothyroid   . Iron deficiency   . PAD (peripheral artery disease) (HCC)   . Pulmonary hypertension (HCC)   . Seizure disorder (HCC)   . Thoracic aortic aneurysm (HCC)   . Thrombocytopenia (HCC)   . Thyroid nodule   . Tricuspid regurgitation   . Vitamin D deficiency     Past Surgical History:  Procedure Laterality Date  . MULTIPLE EXTRACTIONS WITH ALVEOLOPLASTY N/A 10/17/2016   Procedure: Extraction of tooth #'s 4, 21,22,27,28,29 with alveoloplasty;  Surgeon: Charlynne Pander, DDS;  Location: [facility] OR;  Service: Oral Surgery;  Laterality: N/A;  . NO PAST SURGERIES    . RIGHT/LEFT HEART CATH AND CORONARY ANGIOGRAPHY N/A 01/11/2017   Procedure: RIGHT/LEFT HEART CATH AND CORONARY ANGIOGRAPHY;  Surgeon: Tonny Bollman, MD;  Location: [facility] INVASIVE CV LAB;  Service: Cardiovascular;  Laterality: N/A;    No family history on file.  Social History Social History   Tobacco Use  . Smoking status: Never Smoker  . Smokeless tobacco: Never Used  Substance Use Topics  . Alcohol use: No  . Drug use: No    Prior to Admission medications   Medication Sig Start Date End Date Taking? Authorizing Provider  aspirin EC 81 MG tablet Take 81 mg by mouth daily.     [provider]  levothyroxine (SYNTHROID, LEVOTHROID) 50 MCG tablet Take 50 mcg by mouth  daily before breakfast.  01/27/14   [provider]  simvastatin (ZOCOR) 40 MG tablet Take 40 mg by mouth daily at 6 PM.     [provider]  tamsulosin (FLOMAX) 0.4 MG CAPS capsule Take 0.4 mg by mouth daily after supper.     [provider]    No Known Allergies   Review of Systems:              General:                      normal appetite, decreased energy, no weight gain, no weight loss, no fever             Cardiac:                       no chest pain with exertion, no chest pain at rest, no SOB with exertion, no resting SOB, no PND, no orthopnea, no palpitations, no arrhythmia, no atrial fibrillation, no LE edema, no dizzy spells, no syncope  Respiratory:                 no shortness of breath, no home oxygen, no productive cough, no dry cough, no bronchitis, no wheezing, no hemoptysis, no asthma, no pain with inspiration or cough, no sleep apnea, no CPAP at night             GI:                               no difficulty swallowing, no reflux, no frequent heartburn, no hiatal hernia, no abdominal pain, no constipation, no diarrhea, no hematochezia, no hematemesis, no melena             GU:                              no dysuria,  no frequency, no urinary tract infection, no hematuria, no enlarged prostate, no kidney stones, no kidney disease             Vascular:                     no pain suggestive of claudication, no pain in feet, no leg cramps, no varicose veins, no DVT, no non-healing foot ulcer             Neuro:                         no stroke, no TIA's, no seizures, no headaches, no temporary blindness one eye,  no slurred speech, no peripheral neuropathy, no chronic pain, no instability of gait, no memory/cognitive dysfunction             Musculoskeletal:         no arthritis, no joint swelling, no myalgias, no difficulty walking, normal mobility              Skin:                            no rash, no itching, no skin infections, no  pressure sores or ulcerations             Psych:                         no anxiety, no depression, no nervousness, no unusual recent stress             Eyes:                           no blurry vision, no floaters, no recent vision changes, + wears glasses or contacts             ENT:                            no hearing loss, + loose or painful teeth, + partial dentures, last saw dentist many years ago             Hematologic:               no easy bruising, no abnormal bleeding, no clotting disorder, no frequent epistaxis             Endocrine:  no diabetes, does not check CBG's at home                                       Physical Exam:              BP 140/84   Pulse 72   Resp 20   Ht (!) 6.4" (0.163 m)   Wt 178 lb (80.7 kg)   SpO2 92% Comment: RA  BMI 3055.36 kg/m              General:                      Elderly male NAD, well-appearing             HEENT:                       Unremarkable but poor dentition             Neck:                           no JVD, no bruits, no adenopathy              Chest:                          clear to auscultation, symmetrical breath sounds, no wheezes, no rhonchi              CV:                              RRR, grade IV/VI crescendo/decrescendo murmur heard best at RUSB,  no diastolic murmur             Abdomen:                    soft, non-tender, no masses              Extremities:                 warm, well-perfused, pulses palpable, no LE edema             Rectal/GU                   Deferred             Neuro:                         Grossly non-focal and symmetrical throughout             Skin:                            Clean and dry, no rashes, no breakdown    Diagnostic Tests:  Stress Echocardiography  Patient: Georgie, Haque MR #: [id number] Study Date: 10/22/2016 Gender: M Age: 89 Height: 193 cm Weight: 80 kg BSA: 2.06 m^2 Pt. Status: Room:  ATTENDING  Tressie Stalker, M.D. ORDERING Tressie Stalker, M.D. REFERRING Tressie Stalker, M.D. SONOGRAPHER Perley Jain, RDCS PERFORMING [facility], Inpatient SONOGRAPHER Sheralyn Boatman  cc:  -------------------------------------------------------------------  ------------------------------------------------------------------- Indications: Aortic stenosis 424.1.  ------------------------------------------------------------------- History: PMH: Aortic valve disease.  ------------------------------------------------------------------- Study Conclusions  - Aortic  valve: Valve area (VTI): 1.64 cm^2. Valve area (Vmax): 1.86 cm^2. Valve area (Vmean): 1.49 cm^2. - Stress ECG conclusions: There were no stress arrhythmias or conduction abnormalities. The stress ECG was negative for ischemia. - Staged echo: There was no echocardiographic evidence for stress-induced ischemia.  Impressions:  - Baseline LVEF 65-70%, transaortic gradients peak/mean 66/39 mmHg. With exercise (stationary bike) peak/mean gradients 103/58 mmHg consistent with critical aortic stenosis. Aortic valve is severely thickened and calcified with severely restricted leaflet opening.  ------------------------------------------------------------------- Study data: Study status: Routine. Consent: The risks, benefits, and alternatives to the procedure were explained to the patient and informed consent was obtained. Procedure: Study was performed on a stationary bike in conjunction with a CPX test to evaluate aortic stenosis severity. Initial setup. The patient was brought to the laboratory. A baseline ECG was recorded. Surface ECG leads and automatic cuff blood pressure measurements were monitored. Exercise testing was performed. Transthoracic stress echocardiography. Images were captured at baseline and peak exercise. Study completion: The patient tolerated the procedure well.  There were no complications. Modified Bruce protocol. Stress echocardiography. Birthdate: Patient birthdate: 1924/11/01. Age: Patient is 83 yr old. Sex: Gender: male. BMI: 21.5 kg/m^2. Patient status: Outpatient. Study date: Study date: 10/22/2016. Study time: 11:03 AM.  -------------------------------------------------------------------  ------------------------------------------------------------------- Aortic valve: Doppler: VTI ratio of LVOT to aortic valve: 0.39. Valve area (VTI): 1.64 cm^2. Indexed valve area (VTI): 0.79 cm^2/m^2. Peak velocity ratio of LVOT to aortic valve: 0.45. Valve area (Vmax): 1.86 cm^2. Indexed valve area (Vmax): 0.9 cm^2/m^2. Mean velocity ratio of LVOT to aortic valve: 0.36. Valve area (Vmean): 1.49 cm^2. Indexed valve area (Vmean): 0.72 cm^2/m^2. Mean gradient (S): 49 mm Hg. Peak gradient (S): 102 mm Hg.  ------------------------------------------------------------------- Baseline ECG: Normal.  ------------------------------------------------------------------- Stress protocol:  +--------+--------+ !Stage !Symptoms! +--------+--------+ !Baseline!None ! +--------+--------+  ------------------------------------------------------------------- Stress results: The target heart rate was achieved. The heart rate response to stress was normal. There was a normal resting blood pressure with an appropriate response to stress. The patient experienced no chest pain during stress.  ------------------------------------------------------------------- Stress ECG: There were no stress arrhythmias or conduction abnormalities. The stress ECG was negative for ischemia.  ------------------------------------------------------------------- Stress echo results: Left ventricular ejection fraction was normal at rest and with stress. There was no echocardiographic evidence for stress-induced  ischemia.  ------------------------------------------------------------------- Measurements  Left ventricle Value Stroke volume, 2D 133 ml Stroke volume/bsa, 2D 64 ml/m^2  LVOT Value LVOT ID, S 23 mm LVOT area 4.15 cm^2 LVOT peak velocity, S 227 cm/s LVOT mean velocity, S 116 cm/s LVOT VTI, S 32 cm LVOT peak gradient, S 21 mm Hg  Aortic valve Value Aortic valve peak velocity, S 506 cm/s Aortic valve mean velocity, S 324 cm/s Aortic valve VTI, S 81.1 cm Aortic mean gradient, S 49 mm Hg Aortic peak gradient, S 102 mm Hg VTI ratio, LVOT/AV 0.39 Aortic valve area, VTI 1.64 cm^2 Aortic valve area/bsa, VTI 0.79 cm^2/m^2 Velocity ratio, peak, LVOT/AV 0.45 Aortic valve area, peak velocity 1.86 cm^2 Aortic valve area/bsa, peak velocity 0.9 cm^2/m^2 Velocity ratio, mean, LVOT/AV 0.36 Aortic valve area, mean velocity 1.49 cm^2 Aortic valve area/bsa, mean velocity 0.72 cm^2/m^2  Legend: (L) and (H) mark values outside specified reference range.  ------------------------------------------------------------------- Prepared and Electronically Authenticated by  Tobias AlexanderKatarina Nelson, M.D. 2018-08-27T14:12:08    Referred for: Assessment of functional capacity   Procedure: This patient underwent staged symptom-limited exercise testing using a individualized bike protocol with expired gas analysis metabolic evaluation during exercise.  Demographics  Age: [age] Ht. (in.) 7076  Wt. (lb) 177.4 BMI: 21.6   Predicted Peak VO2: 17.6  Gender: Male Ht (cm) 193 Wt. (kg) 80.5    Results  Pre-Exercise PFTs   FVC 3.56 (95%)     FEV1 2.70 (101%)      FEV1/FVC 76 (103%)      MVV 88 (71%)      Exercise Time:  8:00   Watts: 70  RPE: 17  Reason stopped: Patient ended test due to leg fatigue and difficulty maintaining pedal contact on cycle.   Additional symptoms: Dyspnea (4/10)  Resting HR: 69 Peak HR: 113  (86% age predicted max HR)  BP rest: 106/64 BP peak: 150/78  Peak VO2: 16.1 (91% predicted peak VO2)  VE/VCO2 slope: 44  OUES: 1.45  Peak RER: 1.17  Ventilatory Threshold: 13.7 (78% predicted or measured peak VO2)  Peak RR 32  Peak Ventilation: 60.8  VE/MVV: 69%  PETCO2 at peak: 25  O2pulse: 12  (109% predicted O2pulse)   Interpretation  Notes: Patient gave a very good effort. Pulse-oximetry remained within 95-99% for the duration of exercise. Exercise was performed on a cycle ergometer starting at [facility] and increasing by 10W/min. Rest and immediate post-exercise echo were performed.   ECG: Resting ECG in normal sinus rhythm with left axis deviation and previous infarct. HR Response appropriate. There were no sustained arrhythmias and no ST-T changes. BP response appropriate.   PFT: Pre-exercise spirometry was within normal limits. The MVV was normal.   CPX: Exercise testing with gas exchange demonstrates a normal peak VO2 of 16.1 ml/kg/min (91% of the age/gender/weight matched sedentary norms). The RER of 1.17 indicates a maximal effort. The VE/VCO2 slope is significantly elevated and indicates excessive dead space ventilation. The oxygen uptake efficiency slope (OUES) is normal. The VO2 at the ventilatory threshold was normal at 78% of the predicted peak VO2. At peak exercise, the ventilation reached 69% of the measured MVV indicating ventilatory limits were beginning to approach. The O2pulse (a surrogate for stroke  volume) increased with incremental exercise, reaching peak at 12 ml/beat (109%).   Conclusion: Exercise testing with gas exchange demonstrates a normal functional capacity when compared to matched sedentary norms. There is no indication for pulmonary limitations. Although PVO2 within normal range, patient is likely moderately circulatory limited with significantly elevated VE/VCO2 slope, which indicates excessive dead space ventilation.   Test, report and preliminary impression by: Lesia Hausen, MS, ACSM-RCEP 10/22/2016 3:02 PM  Agree with above. Probable mild circulatory limitation. Markedly elevated VeVCO2 suggests elevated pulmonary pressures during exercise.   Arvilla Meres, MD  6:39 PM   Cardiac TAVR CT  TECHNIQUE: The patient was scanned on a Siemens 192 slice scanner. A 120 kV retrospective scan was triggered in the ascending thoracic aorta at 140 HU's. Gantry rotation speed was 250 msecs and collimation was .6 mm. Lopressor 5 mg given the 3D data set was reconstructed in 5% intervals of the R-R cycle. Systolic and diastolic phases were analyzed on a dedicated work station using MPR, MIP and VRT modes. The patient received 80 cc of contrast.  FINDINGS: Aortic Valve: Tri leaflet severely calcified valve with restricted leaflet motion  Aorta: Moderate aortic root dilatation. Significant angulation of the mid aortic arch after the take off of the left subclavian. Marked tortuosity of the descending thoracic aorta  Sinotubular Junction: 34.6 mm  Ascending Thoracic Aorta: 46 mm  Aortic Arch: 39 mm  Descending Thoracic Aorta: 30 mm  Sinus of Valsalva Measurements:  Non-coronary: 39 mm  Right -coronary:  36.9 mm  Left -coronary: 37.5 mm  Coronary Artery Height above Annulus:  Left Main: 15.2 mm above annulus  Right Coronary: 20.5 mm above annulus  Virtual Basal Annulus Measurements:  Maximum/Minimum Diameter: 28.2 mm x 19.6 mm  elliptical  Perimeter: 81.9 mm  Area: 458 mm2  Coronary Arteries: Sufficient height above annulus for deployment  Optimum Fluoroscopic Angle for Delivery: LAO 2 degrees Caudal 23 degrees  IMPRESSION: 1) Severely calcified tri leaflet aortic valve with annular area 458 mm2 perimeter 81.9 mm. Suitable for a 26 mm Sapien 3 valve Although diameters smaller and annulus elliptical sinus and perimeter  Measurements suggest may be suitable for a 34 mm Evolut R valve  2) Moderate ascending aortic root dilatation 46 mm with marked angulation of the mid arch and marked tortuosity of the descending thoracic aorta which may make delivery challenging  3) Sufficient coronary height for deployment  4) Optimum angle for deployment LAO 2 degrees Caudal 23 degrees  5) No LAA thrombus  Charlton Haws   Electronically Signed By: Charlton Haws M.D. On: 10/25/2016 13:29    CT ANGIOGRAPHY CHEST, ABDOMEN AND PELVIS  TECHNIQUE: Multidetector CT imaging through the chest, abdomen and pelvis was performed using the standard protocol during bolus administration of intravenous contrast. Multiplanar reconstructed images and MIPs were obtained and reviewed to evaluate the vascular anatomy.  CONTRAST: 100 mL of Isovue 370.  COMPARISON: None.  FINDINGS: CTA CHEST FINDINGS  Cardiovascular: Heart size is borderline enlarged. There is no significant pericardial fluid, thickening or pericardial calcification. There is aortic atherosclerosis, as well as atherosclerosis of the great vessels of the mediastinum and the coronary arteries, including calcified atherosclerotic plaque in the left anterior descending, left circumflex and right coronary arteries. Severe thickening calcification of the aortic valve. Mild aneurysmal dilatation of the ascending thoracic aorta which measures up to 4.6 cm in diameter. Distal aortic arch is also ectatic measuring 3.9 cm in  diameter.  Mediastinum/Lymph Nodes: No pathologically enlarged mediastinal or hilar lymph nodes. Esophagus is unremarkable in appearance. No axillary lymphadenopathy.  Lungs/Pleura: 4 mm subpleural nodule in the right middle lobe associated with the minor fissure (axial image 63 of series 7), statistically likely a benign subpleural lymph node. Areas of mild scarring in the lower lobes of lungs bilaterally. No acute consolidative airspace disease. No pleural effusions.  Musculoskeletal/Soft Tissues: Several old healed left-sided rib fractures. There are no aggressive appearing lytic or blastic lesions noted in the visualized portions of the skeleton.  CTA ABDOMEN AND PELVIS FINDINGS  Hepatobiliary: Again noted are multiple low-attenuation lesions scattered throughout the liver, similar in size, number and distribution to the prior study. The largest of these lesions are compatible with simple cysts, measuring up to 2 cm in diameter in segment 4B. The smaller lesions are too small to definitively characterize, but are presumably small cysts or biliary hamartomas. No intra or extrahepatic biliary ductal dilatation. Gallbladder is normal in appearance.  Pancreas: No pancreatic mass. No pancreatic ductal dilatation. No pancreatic or peripancreatic fluid or inflammatory changes.  Spleen: Unremarkable.  Adrenals/Urinary Tract: Subcentimeter low-attenuation lesion in the posterior aspect of the interpolar region of the right kidney is too small to definitively characterize, but similar to the prior study, likely a tiny cyst. Left kidney and bilateral adrenal glands are normal in appearance. No hydroureteronephrosis. Urinary bladder is normal in appearance.  Stomach/Bowel: The appearance of the stomach is normal. No pathologic dilatation of small bowel or colon. Diffuse mural thickening extending from the rectum to the  hepatic flexure of the colon, which could indicate  underlying colitis. Normal appendix.  Vascular/Lymphatic: Aortic atherosclerosis, with vascular findings and measurements pertinent to potential TAVR procedure, as detailed below. Aneurysmal dilatation of the left common iliac artery which measures up to 21 mm in diameter. 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 Inferior mesenteric artery is widely patent. Two renal arteries are present bilaterally. On the right, there is a very tiny branch extending to the lower pole and a dominant main right renal artery. On the left, there are 2 equal sized renal artery branches. All of these renal arteries appear widely patent without hemodynamically significant stenosis. No lymphadenopathy noted in the abdomen or pelvis.  Reproductive: Prostate gland and seminal vesicles are unremarkable in appearance.  Other: No significant volume of ascites. No pneumoperitoneum.  Musculoskeletal: Multiple old healed fractures in the pelvis, including the left superior and inferior pubic rami, and an old fracture of the right ilium extending into the right acetabulum. There are no aggressive appearing lytic or blastic lesions noted in the visualized portions of the skeleton.  VASCULAR MEASUREMENTS PERTINENT TO TAVR:  AORTA:  Minimal Aortic Diameter - 19 x 21 mm  Severity of Aortic Calcification - mild to moderate  RIGHT PELVIS:  Right Common Iliac Artery -  Minimal Diameter - 13.6 x 12.3 mm  Tortuosity - moderate  Calcification - mild  Right External Iliac Artery -  Minimal Diameter - 11.4 x 11.6 mm  Tortuosity - severe  Calcification - none  Right Common Femoral Artery -  Minimal  Diameter - 12.2 x 12.3 mm  Tortuosity - mild  Calcification - none  LEFT PELVIS:  Left Common Iliac Artery -  Minimal Diameter - 18.3 x 13.2 mm  Tortuosity - severe  Calcification - moderate  Left External Iliac Artery -  Minimal Diameter - 12.2 x 11.9 mm  Tortuosity - moderate to severe  Calcification - mild  Left Common Femoral Artery -  Minimal Diameter - 13.8 x 12.8 mm  Tortuosity - mild  Calcification - mild  Review of the MIP images confirms the above findings.  IMPRESSION: 1. Vascular findings and measurements pertinent to potential T AVR procedure, as detailed above. This patient does appear to have suitable pelvic arterial access bilaterally. 2. Severe thickening calcification of the aortic valve, compatible with the reported clinical history of severe aortic stenosis. 3. Aortic atherosclerosis, in addition to three-vessel coronary artery disease. Please note that although the presence of coronary artery calcium documents the presence of coronary artery disease, the severity of this disease and any potential stenosis cannot be assessed on this non-gated CT examination. Assessment for potential risk factor modification, dietary therapy or pharmacologic therapy may be warranted, if clinically indicated. 4. 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 Recommend semi-annual imaging followup by CTA or MRA and referral to cardiothoracic surgery if not already obtained. This recommendation follows 2010 ACCF/AHA/AATS/ACR/ASA/SCA/SCAI/SIR/STS/SVM Guidelines for the Diagnosis and Management of Patients With Thoracic Aortic Disease. Circulation. 2010; 121: Z610-R604. 5. Mild diffuse rectal and colonic wall thickening extending from the rectum  to the level of the hepatic flexure of the  colon, suggesting underlying colitis. 6. Additional incidental findings, as above. Aortic Atherosclerosis (ICD10-I70.0) and Aortic aneurysm NOS (ICD10-I71.9).   Electronically Signed By: Trudie Reed M.D. On: 10/25/2016 12:28    Echocardiography  Patient: Kavish, Lafitte MR #: [id number] Study Date: 01/07/2017 Gender: M Age: 44 Height: 193 cm Weight: 80.3 kg BSA: 2.07 m^2 Pt. Status: Room:  ATTENDING Tressie Stalker, M.D. ORDERING Tressie Stalker, M.D. REFERRING Tressie Stalker, M.D. SONOGRAPHER Dewitt Hoes, RDCS PERFORMING [facility], Outpatient  cc:  ------------------------------------------------------------------- LV EF: 65% - 70%  ------------------------------------------------------------------- Indications: Aortic stenosis (I35).  ------------------------------------------------------------------- History: PMH: Coronary artery disease. Aortic valve disease. Risk factors: PAD. Pulmonary hypertension. Hypertension. Dyslipidemia.  ------------------------------------------------------------------- Study Conclusions  - Left ventricle: The cavity size was normal. There was moderate concentric hypertrophy. Systolic function was vigorous. The estimated ejection fraction was in the range of 65% to 70%. Wall motion was normal; there were no regional wall motion abnormalities. Doppler parameters are consistent with abnormal left ventricular relaxation (grade 1 diastolic dysfunction). Doppler parameters are consistent with elevated ventricular end-diastolic filling pressure. - Aortic valve: There was mild regurgitation. Mean gradient (S): 66 mm Hg. Peak gradient (S): 110 mm Hg. - Ascending aorta: The ascending aorta was moderately dilated measuring 46 mm. - Right ventricle: Systolic function was normal. - Right atrium: The atrium was moderately dilated. - Tricuspid  valve: There was moderate regurgitation. - Pulmonic valve: There was mild regurgitation. - Pulmonary arteries: Systolic pressure was moderately increased. PA peak pressure: 56 mm Hg (S). - Inferior vena cava: The vessel was normal in size. - Pericardium, extracardiac: There was no pericardial effusion.  Impressions:  - Hyperdynamic LVEF. Critical aortic stenosis.  ------------------------------------------------------------------- Labs, prior tests, procedures, and surgery: Stress echocardiography (10/22/2016).  ------------------------------------------------------------------- Study data: Comparison was made to the study of 10/22/2016. Study status: Routine. Procedure: The patient reported no pain pre or post test. Transthoracic echocardiography. Image quality was adequate. Study completion: There were no complications. Echocardiography. M-mode, complete 2D, 3D, spectral Doppler, and color Doppler. Birthdate: Patient birthdate: 12-Sep-1924. Age: Patient is 83 yr old. Sex: Gender: male. BMI: 21.6 kg/m^2. Blood pressure: 115/73 Patient status: Outpatient. Study date: Study date: 01/07/2017. Study time: 10:40 AM. Location: [place] [facility]  -------------------------------------------------------------------  ------------------------------------------------------------------- Left ventricle: The cavity size was normal. There was moderate concentric hypertrophy. Systolic function was vigorous. The estimated ejection fraction was in the range of 65% to 70%. Wall motion was normal; there were no regional wall motion abnormalities. Doppler parameters are consistent with abnormal left ventricular relaxation (grade 1 diastolic dysfunction). Doppler parameters are consistent with elevated ventricular end-diastolic filling pressure.  ------------------------------------------------------------------- Aortic valve: Trileaflet; severely thickened,  severely calcified leaflets. Mobility was not restricted. Doppler: Transvalvular velocity was within the normal range. There was no stenosis. There was mild regurgitation. VTI ratio of LVOT to aortic valve: 0.3. Valve area (VTI): 0.94 cm^2. Indexed valve area (VTI): 0.45 cm^2/m^2. Peak velocity ratio of LVOT to aortic valve: 0.24. Valve area (Vmax): 0.77 cm^2. Indexed valve area (Vmax): 0.37 cm^2/m^2. Mean velocity ratio of LVOT to aortic valve: 0.27. Valve area (Vmean): 0.86 cm^2. Indexed valve area (Vmean): 0.42 cm^2/m^2. Mean gradient (S): 66 mm Hg. Peak gradient (S): 110 mm Hg.  ------------------------------------------------------------------- Aorta: Aortic root: The aortic root was normal in size. 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  ------------------------------------------------------------------- Mitral valve: Structurally normal valve. Mobility was not restricted. Doppler: Transvalvular velocity was within the normal range. There was no  evidence for stenosis. There was no regurgitation. Peak gradient (D): 2 mm Hg.  ------------------------------------------------------------------- Left atrium: The atrium was normal in size.  ------------------------------------------------------------------- Right ventricle: The cavity size was normal. Wall thickness was normal. Systolic function was normal.  ------------------------------------------------------------------- Pulmonic valve: Structurally normal valve. Cusp separation was normal. Doppler: Transvalvular velocity was within the normal range. There was no evidence for stenosis. There was mild regurgitation.  ------------------------------------------------------------------- Tricuspid valve: Structurally normal valve. Doppler: Transvalvular velocity was within the normal range. There was moderate  regurgitation.  ------------------------------------------------------------------- Pulmonary artery: The main pulmonary artery was normal-sized. Systolic pressure was moderately increased.  ------------------------------------------------------------------- Right atrium: The atrium was moderately dilated.  ------------------------------------------------------------------- Pericardium: There was no pericardial effusion.  ------------------------------------------------------------------- Systemic veins: Inferior vena cava: The vessel was normal in size.  ------------------------------------------------------------------- Measurements  Left ventricle Value Reference LV ID, ED, PLAX chordal (L) 38 mm 43 - 52 LV ID, ES, PLAX chordal 27.1 mm 23 - 38 LV fx shortening, PLAX chordal 29 % >=29 LV PW thickness, ED 11 mm --------- IVS/LV PW ratio, ED (H) 1.45 <=1.3 Stroke volume, 2D 101 ml --------- Stroke volume/bsa, 2D 49 ml/m^2 --------- LV e&', lateral 4.35 cm/s --------- LV E/e&', lateral 17.61 --------- LV e&', medial 4.46 cm/s --------- LV E/e&', medial 17.17 --------- LV e&', average 4.41 cm/s --------- LV E/e&', average 17.39 ---------  Ventricular septum Value Reference IVS thickness, ED 15.9 mm ---------  LVOT Value Reference LVOT ID, S 20 mm --------- LVOT area  3.14 cm^2 --------- LVOT peak velocity, S 122 cm/s --------- LVOT mean velocity, S 98.9 cm/s --------- LVOT VTI, S 32.3 cm --------- LVOT peak gradient, S 6 mm Hg ---------  Aortic valve Value Reference Aortic valve peak velocity, S 498 cm/s --------- Aortic valve mean velocity, S 360 cm/s --------- Aortic valve VTI, S 108 cm --------- Aortic mean gradient, S 61 mm Hg --------- Aortic peak gradient, S 99 mm Hg --------- VTI ratio, LVOT/AV 0.3 --------- Aortic valve area, VTI 0.94 cm^2 --------- Aortic valve area/bsa, VTI 0.45 cm^2/m^2 --------- Velocity ratio, peak, LVOT/AV 0.24 --------- Aortic valve area, peak velocity 0.77 cm^2 --------- Aortic valve area/bsa, peak 0.37 cm^2/m^2 --------- velocity Velocity ratio, mean, LVOT/AV 0.27 --------- Aortic valve area, mean velocity 0.86 cm^2 --------- Aortic valve area/bsa, mean 0.42 cm^2/m^2 --------- velocity  Aorta Value Reference Aortic root ID, ED 40 mm --------- Ascending aorta ID, A-P, S 45 mm ---------  Left atrium Value Reference LA ID, A-P, ES 41 mm --------- LA ID/bsa, A-P 1.98 cm/m^2 <=2.2 LA volume, S 43.6 ml --------- LA volume/bsa, S 21.1 ml/m^2  --------- LA volume, ES, 1-p A4C 26.6 ml --------- LA volume/bsa, ES, 1-p A4C 12.9 ml/m^2 --------- LA volume, ES, 1-p A2C 60.6 ml --------- LA volume/bsa, ES, 1-p A2C 29.3 ml/m^2 ---------  Mitral valve Value Reference Mitral E-wave peak velocity 76.6 cm/s --------- Mitral A-wave peak velocity 111 cm/s --------- Mitral deceleration time (H) 327 ms 150 - 230 Mitral peak gradient, D 2 mm Hg --------- Mitral E/A ratio, peak 0.7 ---------  Pulmonary arteries Value Reference PA pressure, S, DP (H) 56 mm Hg <=30  Tricuspid valve Value Reference Tricuspid regurg peak velocity 347 cm/s --------- Tricuspid peak RV-RA gradient 48 mm Hg ---------  Systemic veins Value Reference Estimated CVP 8 mm Hg ---------  Right ventricle Value Reference TAPSE 31.3 mm --------- RV pressure, S, DP (H) 56 mm Hg <=30 RV s&', lateral, S 15 cm/s ---------  Pulmonic valve Value Reference Pulmonic regurg velocity, ED 147 cm/s --------- Pulmonic regurg  gradient, ED 9 mm Hg ---------  Legend: (L) and (H) mark values outside specified reference range.  ------------------------------------------------------------------- Prepared and Electronically Authenticated by  Tobias Alexander, M.D. 2018-11-12T11:46:06      Impression:  Patient has at least stage C1 very severe asymptomatic aortic stenosis and probably early stage D symptomatic disease.  He develops shortness of breath and fatigue only with more strenuous exertion, consistent with chronic diastolic CHF functional class I.  I have personally reviewed the patient's follow-up echocardiogram performed earlier today which reveals significant progression of disease with peak velocity across the aortic valve now measured 5 m/s at rest corresponding to a mean transvalvular gradient greater than 60 mmHg.  Left ventricular systolic function remains normal.  Cardiac-gated CTA of the heart reveals anatomical characteristics consistent with aortic stenosis suitable for treatment by transcatheter aortic valve replacement without any significant complicating features and CTA of the aorta and iliac vessels demonstrate what appears to be adequate pelvic vascular access to facilitate a transfemoral approach.The patient would likely be good candidate for transcatheter aortic valve replacement using percutaneous transfemoral access, although diagnostic cardiac catheterization has not been performed.    Plan:  The patient and his wife were counseled at length regarding treatment alternatives for management of severe symptomatic aortic stenosis. Alternative approaches such as conventional aortic valve replacement, transcatheter aortic valve replacement, and continued medical therapy were compared and contrasted at length.  The risks associated with conventional surgical aortic valve replacement were been discussed in detail, as were expectations for post-operative convalescence, and why I would be reluctant to consider this patient a candidate for conventional surgery.  Issues specific to transcatheter aortic valve replacement were discussed including questions about long term valve durability, the potential for paravalvular leak, possible increased risk of need  for permanent pacemaker placement, and other technical complications related to the procedure itself.  Long-term prognosis with medical therapy was discussed. This discussion was placed in the context of the patient's own specific clinical presentation and past medical history.  All of their questions been addressed.  The patient desires to proceed with transcatheter aortic valve replacement in the near future.  His next step he will need to undergo diagnostic cardiac catheterization to rule out the presence of significant coronary artery disease.  We tentatively plan to proceed with transcatheter aortic valve replacement on Tuesday, January 22, 2017.  Prior to surgery he will need to have another surgical consultation for a second opinion.  Following the decision to proceed with transcatheter aortic valve replacement, a discussion has been held regarding what types of management strategies would be attempted intraoperatively in the event of life-threatening complications, including whether or not the patient would be considered a candidate for the use of cardiopulmonary bypass and/or conversion to open sternotomy for attempted surgical intervention.  The patient has been advised of a variety of complications that might develop including but not limited to risks of death, stroke, paravalvular leak, aortic dissection or other major vascular complications, aortic annulus rupture, device embolization, cardiac rupture or perforation, mitral regurgitation, acute myocardial infarction, arrhythmia, heart block or bradycardia requiring permanent pacemaker placement, congestive heart failure, respiratory failure, renal failure, pneumonia, infection, other late complications related to structural valve deterioration or migration, or other complications that might ultimately cause a temporary or permanent loss of functional independence or other long term morbidity.  The patient provides full informed consent for the  procedure as described and all questions were answered.    Salvatore Decent. Cornelius Moras, MD 01/07/2017 5:23 PM

## 2017-01-21 NOTE — Progress Notes (Signed)
Anesthesia chart review: Patient is an 83 year old male scheduled for TAVR, transfemoral approach on 01/22/17 by Dr. Tonny BollmanMichael Cooper and Dr. Tressie Stalkerlarence Owen.  History includes never smoker, critical aortic stenosis, moderate TR, moderate pulmonary hypertension, dilated aortic root/ascending TAA, PAD with left CIA aneurysm (2.1 cm 10/25/16), hypercholesterolemia, thrombocytopenia, hyperglycemia, gastroparesis, ED, seizure disorder, thyroid nodule, hypothyroidism, iron deficiency anemia, BPH, HTN (patient denied). Carotid artery disease is listed but no ICA stenosis on 12/2016 [place]. S/P dental extractions 10/17/16.  PCP is Roxanne MinsMichael Duran, PA-C. Primary cardiologist is Dr. Lollie Marrowaymond Rosario [facility]).  Meds include aspirin 81 mg, levothyroxine, simvastatin, Flomax.  BP 111/77   Pulse 63   Temp 36.6 C   Resp 18   Ht 6\' 4"  (1.93 m)   Wt 178 lb 1.6 oz (80.8 kg)   SpO2 100%   BMI 21.68 kg/m   See Dr. Barry Dieneswens H&P note for details regarding recent CTA chest/abd/pelvis. CPX, stress echocardiogram, CT coronary.   Cardiac cath 01/11/17:   There is severe aortic valve stenosis. 1.  Diffuse coronary ectasia with mild nonobstructive CAD 2.  Normal right heart hemodynamics 3.  Severely calcified aortic valve with hemodynamic findings consistent with severe aortic stenosis Peak to peak transaortic gradient is 69 mmHg, mean gradient 48 mmHg, aortic valve area 0.88 cm  Echo 01/07/17: Study Conclusions - Left ventricle: The cavity size was normal. There was moderate   concentric hypertrophy. Systolic function was vigorous. The   estimated ejection fraction was in the range of 65% to 70%. Wall   motion was normal; there were no regional wall motion   abnormalities. Doppler parameters are consistent with abnormal   left ventricular relaxation (grade 1 diastolic dysfunction).   Doppler parameters are consistent with elevated ventricular   end-diastolic filling pressure. - Aortic valve: There was  mild regurgitation. Mean gradient (S): 66   mm Hg. Peak gradient (S): 110 mm Hg. - Ascending aorta: The ascending aorta was moderately dilated   measuring 46 mm. - Right ventricle: Systolic function was normal. - Right atrium: The atrium was moderately dilated. - Tricuspid valve: There was moderate regurgitation. - Pulmonic valve: There was mild regurgitation. - Pulmonary arteries: Systolic pressure was moderately increased.   PA peak pressure: 56 mm Hg (S). - Inferior vena cava: The vessel was normal in size. - Pericardium, extracardiac: There was no pericardial effusion. Impressions: - Hyperdynamic LVEF. Critical aortic stenosis.  EKG 01/11/17: Sinus rhythm with first-degree AV block, left axis deviation, RSR prime or QR pattern in V1 suggests right ventricular conduction delay, inferior infarct (age undetermined), anterior infarct (age undetermined).  Carotid U/S 01/14/17: Final Interpretation: Right Carotid: There is no evidence of stenosis in the right ICA. Left Carotid: There is no evidence of stenosis in the left ICA. Vertebrals: Both vertebral arteries were patent with antegrade flow. Subclavians: Normal flow hemodynamics were seen in bilateral subclavian arteries.  CXR 01/18/17: IMPRESSION: 1. No acute disease. 2. Tortuous ectatic thoracic aorta.  PFTs 01/14/17: FVC 3.72 (84%), FEV1 2.88 (90%), DLCO unc 15.63 (38%).  Preoperative labs noted. Cr 0.79, calcium 8.7, AST/ALT 13/11, BNP 109, H/H 10.8/34.0, PLT 204K, PT/INR 13.6/1.05, PTT 37, glucose 98, A1c 5.6. UA WNL.   If no acute changes then I would anticipate that he can proceed as planned.  Velna Ochsllison Kyeshia Zinn, PA-C [facility]Anesthesiology Phone [phone] 01/21/2017 11:31 AM

## 2017-01-22 ENCOUNTER — Inpatient Hospital Stay (HOSPITAL_COMMUNITY): Payer: Medicare HMO | Admitting: Certified Registered Nurse Anesthetist

## 2017-01-22 ENCOUNTER — Inpatient Hospital Stay (HOSPITAL_COMMUNITY): Payer: Medicare HMO | Admitting: Vascular Surgery

## 2017-01-22 ENCOUNTER — Encounter (HOSPITAL_COMMUNITY): Payer: Self-pay | Admitting: *Deleted

## 2017-01-22 ENCOUNTER — Inpatient Hospital Stay (HOSPITAL_COMMUNITY): Payer: Medicare HMO

## 2017-01-22 ENCOUNTER — Ambulatory Visit (HOSPITAL_COMMUNITY)
Admission: RE | Admit: 2017-01-22 | Discharge: 2017-01-22 | Disposition: A | Discharge status: Home / Self Care | Payer: Medicare HMO | Source: Ambulatory Visit | Attending: Cardiovascular Disease | Admitting: Cardiovascular Disease

## 2017-01-22 ENCOUNTER — Encounter (HOSPITAL_COMMUNITY)
Admission: RE | Disposition: A | Discharge status: Home / Self Care | Payer: Self-pay | Source: Ambulatory Visit | Attending: Thoracic Surgery (Cardiothoracic Vascular Surgery)

## 2017-01-22 ENCOUNTER — Inpatient Hospital Stay (HOSPITAL_COMMUNITY)
Admission: RE | Admit: 2017-01-22 | Discharge: 2017-01-24 | DRG: 267 | Disposition: A | Discharge status: Home / Self Care | Payer: Medicare HMO | Source: Ambulatory Visit | Attending: Thoracic Surgery (Cardiothoracic Vascular Surgery) | Admitting: Thoracic Surgery (Cardiothoracic Vascular Surgery)

## 2017-01-22 ENCOUNTER — Other Ambulatory Visit: Payer: Self-pay

## 2017-01-22 DIAGNOSIS — I350 Nonrheumatic aortic (valve) stenosis: Secondary | ICD-10-CM

## 2017-01-22 DIAGNOSIS — I2510 Atherosclerotic heart disease of native coronary artery without angina pectoris: Secondary | ICD-10-CM | POA: Diagnosis present

## 2017-01-22 DIAGNOSIS — D696 Thrombocytopenia, unspecified: Secondary | ICD-10-CM | POA: Diagnosis present

## 2017-01-22 DIAGNOSIS — G40909 Epilepsy, unspecified, not intractable, without status epilepticus: Secondary | ICD-10-CM | POA: Diagnosis present

## 2017-01-22 DIAGNOSIS — I082 Rheumatic disorders of both aortic and tricuspid valves: Principal | ICD-10-CM | POA: Diagnosis present

## 2017-01-22 DIAGNOSIS — E039 Hypothyroidism, unspecified: Secondary | ICD-10-CM | POA: Diagnosis present

## 2017-01-22 DIAGNOSIS — D649 Anemia, unspecified: Secondary | ICD-10-CM | POA: Diagnosis present

## 2017-01-22 DIAGNOSIS — E7800 Pure hypercholesterolemia, unspecified: Secondary | ICD-10-CM | POA: Diagnosis present

## 2017-01-22 DIAGNOSIS — R011 Cardiac murmur, unspecified: Secondary | ICD-10-CM | POA: Diagnosis present

## 2017-01-22 DIAGNOSIS — I447 Left bundle-branch block, unspecified: Secondary | ICD-10-CM | POA: Diagnosis present

## 2017-01-22 DIAGNOSIS — Z79890 Hormone replacement therapy (postmenopausal): Secondary | ICD-10-CM | POA: Diagnosis not present

## 2017-01-22 DIAGNOSIS — I700 Atherosclerosis of aorta: Secondary | ICD-10-CM | POA: Diagnosis present

## 2017-01-22 DIAGNOSIS — Z79899 Other long term (current) drug therapy: Secondary | ICD-10-CM

## 2017-01-22 DIAGNOSIS — I5032 Chronic diastolic (congestive) heart failure: Secondary | ICD-10-CM | POA: Diagnosis present

## 2017-01-22 DIAGNOSIS — Z006 Encounter for examination for normal comparison and control in clinical research program: Secondary | ICD-10-CM

## 2017-01-22 DIAGNOSIS — N401 Enlarged prostate with lower urinary tract symptoms: Secondary | ICD-10-CM | POA: Diagnosis present

## 2017-01-22 DIAGNOSIS — E559 Vitamin D deficiency, unspecified: Secondary | ICD-10-CM | POA: Diagnosis present

## 2017-01-22 DIAGNOSIS — I361 Nonrheumatic tricuspid (valve) insufficiency: Secondary | ICD-10-CM | POA: Diagnosis not present

## 2017-01-22 DIAGNOSIS — I739 Peripheral vascular disease, unspecified: Secondary | ICD-10-CM | POA: Diagnosis present

## 2017-01-22 DIAGNOSIS — Z952 Presence of prosthetic heart valve: Secondary | ICD-10-CM

## 2017-01-22 DIAGNOSIS — E785 Hyperlipidemia, unspecified: Secondary | ICD-10-CM | POA: Diagnosis present

## 2017-01-22 DIAGNOSIS — I272 Pulmonary hypertension, unspecified: Secondary | ICD-10-CM | POA: Diagnosis present

## 2017-01-22 DIAGNOSIS — Z954 Presence of other heart-valve replacement: Secondary | ICD-10-CM | POA: Diagnosis not present

## 2017-01-22 DIAGNOSIS — Z7982 Long term (current) use of aspirin: Secondary | ICD-10-CM

## 2017-01-22 DIAGNOSIS — I110 Hypertensive heart disease with heart failure: Secondary | ICD-10-CM | POA: Diagnosis present

## 2017-01-22 DIAGNOSIS — I712 Thoracic aortic aneurysm, without rupture: Secondary | ICD-10-CM | POA: Diagnosis present

## 2017-01-22 DIAGNOSIS — I10 Essential (primary) hypertension: Secondary | ICD-10-CM | POA: Diagnosis present

## 2017-01-22 HISTORY — PX: TRANSCATHETER AORTIC VALVE REPLACEMENT, TRANSFEMORAL: SHX6400

## 2017-01-22 HISTORY — DX: Presence of prosthetic heart valve: Z95.2

## 2017-01-22 HISTORY — PX: TEE WITHOUT CARDIOVERSION: SHX5443

## 2017-01-22 LAB — POCT I-STAT 4, (NA,K, GLUC, HGB,HCT)
GLUCOSE: 116 mg/dL — AB (ref 65–99)
HEMATOCRIT: 28 % — AB (ref 39.0–52.0)
HEMOGLOBIN: 9.5 g/dL — AB (ref 13.0–17.0)
Potassium: 3.4 mmol/L — ABNORMAL LOW (ref 3.5–5.1)
SODIUM: 141 mmol/L (ref 135–145)

## 2017-01-22 LAB — POCT I-STAT 3, ART BLOOD GAS (G3+)
ACID-BASE EXCESS: 1 mmol/L (ref 0.0–2.0)
BICARBONATE: 24 mmol/L (ref 20.0–28.0)
Bicarbonate: 25.8 mmol/L (ref 20.0–28.0)
O2 Saturation: 99 %
O2 Saturation: 100 %
PH ART: 7.418 (ref 7.350–7.450)
PH ART: 7.408 (ref 7.350–7.450)
PO2 ART: 150 mmHg — AB (ref 83.0–108.0)
TCO2: 25 mmol/L (ref 22–32)
TCO2: 27 mmol/L (ref 22–32)
pCO2 arterial: 37.2 mmHg (ref 32.0–48.0)
pCO2 arterial: 40.9 mmHg (ref 32.0–48.0)
pO2, Arterial: 263 mmHg — ABNORMAL HIGH (ref 83.0–108.0)

## 2017-01-22 LAB — POCT I-STAT, CHEM 8
BUN: 10 mg/dL (ref 6–20)
BUN: 11 mg/dL (ref 6–20)
BUN: 10 mg/dL (ref 6–20)
CALCIUM ION: 1.2 mmol/L (ref 1.15–1.40)
CHLORIDE: 108 mmol/L (ref 101–111)
CHLORIDE: 108 mmol/L (ref 101–111)
Calcium, Ion: 1.18 mmol/L (ref 1.15–1.40)
Calcium, Ion: 1.23 mmol/L (ref 1.15–1.40)
Chloride: 106 mmol/L (ref 101–111)
Creatinine, Ser: 0.7 mg/dL (ref 0.61–1.24)
Creatinine, Ser: 0.7 mg/dL (ref 0.61–1.24)
Creatinine, Ser: 0.6 mg/dL — ABNORMAL LOW (ref 0.61–1.24)
GLUCOSE: 113 mg/dL — AB (ref 65–99)
Glucose, Bld: 110 mg/dL — ABNORMAL HIGH (ref 65–99)
Glucose, Bld: 119 mg/dL — ABNORMAL HIGH (ref 65–99)
HCT: 25 % — ABNORMAL LOW (ref 39.0–52.0)
HEMATOCRIT: 26 % — AB (ref 39.0–52.0)
HEMATOCRIT: 30 % — AB (ref 39.0–52.0)
HEMOGLOBIN: 10.2 g/dL — AB (ref 13.0–17.0)
HEMOGLOBIN: 8.8 g/dL — AB (ref 13.0–17.0)
Hemoglobin: 8.5 g/dL — ABNORMAL LOW (ref 13.0–17.0)
POTASSIUM: 3.2 mmol/L — AB (ref 3.5–5.1)
POTASSIUM: 3.3 mmol/L — AB (ref 3.5–5.1)
POTASSIUM: 3.4 mmol/L — AB (ref 3.5–5.1)
SODIUM: 140 mmol/L (ref 135–145)
Sodium: 140 mmol/L (ref 135–145)
Sodium: 142 mmol/L (ref 135–145)
TCO2: 23 mmol/L (ref 22–32)
TCO2: 23 mmol/L (ref 22–32)
TCO2: 23 mmol/L (ref 22–32)

## 2017-01-22 LAB — CBC
HCT: 29.5 % — ABNORMAL LOW (ref 39.0–52.0)
Hemoglobin: 9.2 g/dL — ABNORMAL LOW (ref 13.0–17.0)
MCH: 21.9 pg — ABNORMAL LOW (ref 26.0–34.0)
MCHC: 31.2 g/dL (ref 30.0–36.0)
MCV: 70.1 fL — AB (ref 78.0–100.0)
PLATELETS: 175 10*3/uL (ref 150–400)
RBC: 4.21 MIL/uL — AB (ref 4.22–5.81)
RDW: 15.5 % (ref 11.5–15.5)
WBC: 6.8 10*3/uL (ref 4.0–10.5)

## 2017-01-22 LAB — PROTIME-INR
INR: 1.22
PROTHROMBIN TIME: 15.3 s — AB (ref 11.4–15.2)

## 2017-01-22 LAB — APTT: APTT: 41 s — AB (ref 24–36)

## 2017-01-22 SURGERY — IMPLANTATION, AORTIC VALVE, TRANSCATHETER, FEMORAL APPROACH
Anesthesia: Monitor Anesthesia Care | Site: Chest

## 2017-01-22 MED ORDER — LIDOCAINE HCL (PF) 1 % IJ SOLN
INTRAMUSCULAR | Status: AC
  Filled 2017-01-22: qty 30

## 2017-01-22 MED ORDER — NITROGLYCERIN IN D5W 200-5 MCG/ML-% IV SOLN: 0.0000 ug/min | INTRAVENOUS | Status: DC

## 2017-01-22 MED ORDER — CHLORHEXIDINE GLUCONATE 4 % EX LIQD: 60.0000 mL | Freq: Once | CUTANEOUS | Status: DC

## 2017-01-22 MED ORDER — IODIXANOL 320 MG/ML IV SOLN
INTRAVENOUS | Status: DC | PRN
  Administered 2017-01-22: 50 mL via INTRAVENOUS

## 2017-01-22 MED ORDER — LACTATED RINGERS IV SOLN: 500.0000 mL | Freq: Once | INTRAVENOUS | Status: DC | PRN

## 2017-01-22 MED ORDER — HEPARIN SODIUM (PORCINE) 1000 UNIT/ML IJ SOLN
INTRAMUSCULAR | Status: DC | PRN
Start: 2017-01-22 — End: 2017-01-22
  Administered 2017-01-22: 12000 [IU] via INTRAVENOUS

## 2017-01-22 MED ORDER — LIDOCAINE HCL 1 % IJ SOLN
INTRAMUSCULAR | Status: DC | PRN
  Administered 2017-01-22: 8 mL

## 2017-01-22 MED ORDER — LACTATED RINGERS IV SOLN
INTRAVENOUS | Status: DC | PRN
  Administered 2017-01-22: 10:00:00 via INTRAVENOUS

## 2017-01-22 MED ORDER — CHLORHEXIDINE GLUCONATE 0.12 % MT SOLN
15.0000 mL | Freq: Once | OROMUCOSAL | Status: AC
  Administered 2017-01-22: 15 mL via OROMUCOSAL
  Filled 2017-01-22: qty 15

## 2017-01-22 MED ORDER — DEXTROSE 5 % IV SOLN
0.0000 ug/min | INTRAVENOUS | Status: AC
  Filled 2017-01-22: qty 4

## 2017-01-22 MED ORDER — ACETAMINOPHEN 650 MG RE SUPP: 650.0000 mg | Freq: Once | RECTAL | Status: DC

## 2017-01-22 MED ORDER — PROPOFOL 500 MG/50ML IV EMUL
INTRAVENOUS | Status: DC | PRN
  Administered 2017-01-22: 15 ug/kg/min via INTRAVENOUS

## 2017-01-22 MED ORDER — ALBUMIN HUMAN 5 % IV SOLN: 250.0000 mL | INTRAVENOUS | Status: AC | PRN

## 2017-01-22 MED ORDER — METOPROLOL TARTRATE 5 MG/5ML IV SOLN: 2.5000 mg | INTRAVENOUS | Status: DC | PRN

## 2017-01-22 MED ORDER — SODIUM CHLORIDE 0.9% FLUSH
10.0000 mL | INTRAVENOUS | Status: DC | PRN
  Administered 2017-01-23: 10 mL
  Filled 2017-01-22: qty 40

## 2017-01-22 MED ORDER — ACETAMINOPHEN 325 MG PO TABS: 650.0000 mg | ORAL_TABLET | Freq: Four times a day (QID) | ORAL | Status: DC | PRN

## 2017-01-22 MED ORDER — CHLORHEXIDINE GLUCONATE CLOTH 2 % EX PADS
6.0000 | MEDICATED_PAD | Freq: Every day | CUTANEOUS | Status: DC
  Administered 2017-01-23: 6 via TOPICAL

## 2017-01-22 MED ORDER — SODIUM CHLORIDE 0.9 % IV SOLN
INTRAVENOUS | Status: DC | PRN
  Administered 2017-01-22: 1500 mL

## 2017-01-22 MED ORDER — SIMVASTATIN 40 MG PO TABS
40.0000 mg | ORAL_TABLET | Freq: Every day | ORAL | Status: DC
  Administered 2017-01-22 – 2017-01-23 (×2): 40 mg via ORAL
  Filled 2017-01-22 (×2): qty 1

## 2017-01-22 MED ORDER — CLOPIDOGREL BISULFATE 75 MG PO TABS
75.0000 mg | ORAL_TABLET | Freq: Every day | ORAL | Status: DC
  Administered 2017-01-23 – 2017-01-24 (×2): 75 mg via ORAL
  Filled 2017-01-22 (×2): qty 1

## 2017-01-22 MED ORDER — ASPIRIN 81 MG PO CHEW: 81.0000 mg | CHEWABLE_TABLET | Freq: Every day | ORAL | Status: DC

## 2017-01-22 MED ORDER — LEVOTHYROXINE SODIUM 50 MCG PO TABS
50.0000 ug | ORAL_TABLET | Freq: Every day | ORAL | Status: DC
  Administered 2017-01-23 – 2017-01-24 (×2): 50 ug via ORAL
  Filled 2017-01-22 (×2): qty 1

## 2017-01-22 MED ORDER — ACETAMINOPHEN 160 MG/5ML PO SOLN: 650.0000 mg | Freq: Once | ORAL | Status: DC

## 2017-01-22 MED ORDER — ONDANSETRON HCL 4 MG/2ML IJ SOLN: 4.0000 mg | Freq: Four times a day (QID) | INTRAMUSCULAR | Status: DC | PRN

## 2017-01-22 MED ORDER — CHLORHEXIDINE GLUCONATE 4 % EX LIQD: 30.0000 mL | CUTANEOUS | Status: DC

## 2017-01-22 MED ORDER — MIDAZOLAM HCL 2 MG/2ML IJ SOLN
INTRAMUSCULAR | Status: AC
  Filled 2017-01-22: qty 2

## 2017-01-22 MED ORDER — CEFUROXIME SODIUM 1.5 G IV SOLR
1.5000 g | Freq: Two times a day (BID) | INTRAVENOUS | Status: DC
  Administered 2017-01-22 – 2017-01-23 (×3): 1.5 g via INTRAVENOUS
  Filled 2017-01-22 (×4): qty 1.5

## 2017-01-22 MED ORDER — MORPHINE SULFATE (PF) 2 MG/ML IV SOLN: 2.0000 mg | INTRAVENOUS | Status: DC | PRN

## 2017-01-22 MED ORDER — POTASSIUM CHLORIDE 10 MEQ/50ML IV SOLN
10.0000 meq | INTRAVENOUS | Status: AC
  Administered 2017-01-22 (×3): 10 meq via INTRAVENOUS
  Filled 2017-01-22 (×3): qty 50

## 2017-01-22 MED ORDER — 0.9 % SODIUM CHLORIDE (POUR BTL) OPTIME
TOPICAL | Status: DC | PRN
  Administered 2017-01-22: 4000 mL

## 2017-01-22 MED ORDER — PHENYLEPHRINE HCL 10 MG/ML IJ SOLN
0.0000 ug/min | INTRAMUSCULAR | Status: DC
  Filled 2017-01-22: qty 2

## 2017-01-22 MED ORDER — VANCOMYCIN HCL IN DEXTROSE 1-5 GM/200ML-% IV SOLN
1000.0000 mg | Freq: Once | INTRAVENOUS | Status: AC
  Administered 2017-01-22: 1000 mg via INTRAVENOUS
  Filled 2017-01-22: qty 200

## 2017-01-22 MED ORDER — ASPIRIN EC 81 MG PO TBEC
81.0000 mg | DELAYED_RELEASE_TABLET | Freq: Every day | ORAL | Status: DC
  Administered 2017-01-23 – 2017-01-24 (×2): 81 mg via ORAL
  Filled 2017-01-22 (×2): qty 1

## 2017-01-22 MED ORDER — FENTANYL CITRATE (PF) 250 MCG/5ML IJ SOLN
INTRAMUSCULAR | Status: AC
  Filled 2017-01-22: qty 5

## 2017-01-22 MED ORDER — TAMSULOSIN HCL 0.4 MG PO CAPS
0.4000 mg | ORAL_CAPSULE | Freq: Every day | ORAL | Status: DC
  Administered 2017-01-22 – 2017-01-23 (×2): 0.4 mg via ORAL
  Filled 2017-01-22 (×2): qty 1

## 2017-01-22 MED ORDER — PANTOPRAZOLE SODIUM 40 MG PO TBEC
40.0000 mg | DELAYED_RELEASE_TABLET | Freq: Every day | ORAL | Status: DC
  Administered 2017-01-22 – 2017-01-24 (×3): 40 mg via ORAL
  Filled 2017-01-22 (×3): qty 1

## 2017-01-22 MED ORDER — FENTANYL CITRATE (PF) 100 MCG/2ML IJ SOLN
INTRAMUSCULAR | Status: AC
  Filled 2017-01-22: qty 2

## 2017-01-22 MED ORDER — SODIUM CHLORIDE 0.9% FLUSH
10.0000 mL | Freq: Two times a day (BID) | INTRAVENOUS | Status: DC
  Administered 2017-01-22 – 2017-01-23 (×2): 10 mL

## 2017-01-22 MED ORDER — MORPHINE SULFATE (PF) 4 MG/ML IV SOLN: 2.0000 mg | INTRAVENOUS | Status: DC | PRN

## 2017-01-22 MED ORDER — SODIUM CHLORIDE 0.9 % IV SOLN
INTRAVENOUS | Status: DC
  Administered 2017-01-22: 13:00:00 via INTRAVENOUS

## 2017-01-22 MED ORDER — PROTAMINE SULFATE 10 MG/ML IV SOLN
INTRAVENOUS | Status: DC | PRN
  Administered 2017-01-22: 110 mg via INTRAVENOUS
  Administered 2017-01-22: 10 mg via INTRAVENOUS

## 2017-01-22 MED ORDER — ONDANSETRON HCL 4 MG/2ML IJ SOLN
INTRAMUSCULAR | Status: DC | PRN
  Administered 2017-01-22: 4 mg via INTRAVENOUS

## 2017-01-22 MED ORDER — FENTANYL CITRATE (PF) 250 MCG/5ML IJ SOLN
INTRAMUSCULAR | Status: DC | PRN
  Administered 2017-01-22: 100 ug via INTRAVENOUS

## 2017-01-22 MED ORDER — SODIUM CHLORIDE 0.9 % IV SOLN
INTRAVENOUS | Status: DC
  Administered 2017-01-22: 09:00:00 via INTRAVENOUS

## 2017-01-22 SURGICAL SUPPLY — 107 items
ADAPTER UNIV SWAN GANZ BIP (ADAPTER) ×1 IMPLANT
ADAPTER UNV SWAN GANZ BIP (ADAPTER) ×2
ATTRACTOMAT 16X20 MAGNETIC DRP (DRAPES) IMPLANT
BAG BANDED W/RUBBER/TAPE 36X54 (MISCELLANEOUS) ×3 IMPLANT
BAG DECANTER FOR FLEXI CONT (MISCELLANEOUS) IMPLANT
BAG SNAP BAND KOVER 36X36 (MISCELLANEOUS) ×6 IMPLANT
BLADE 10 SAFETY STRL DISP (BLADE) ×3 IMPLANT
BLADE CLIPPER SURG (BLADE) IMPLANT
BLADE STERNUM SYSTEM 6 (BLADE) IMPLANT
CABLE ADAPT CONN TEMP 6FT (ADAPTER) ×3 IMPLANT
CABLE PACING FASLOC BIEGE (MISCELLANEOUS) IMPLANT
CABLE PACING FASLOC BLUE (MISCELLANEOUS) IMPLANT
CANISTER SUCT 3000ML PPV (MISCELLANEOUS) ×3 IMPLANT
CANNULA FEM VENOUS REMOTE 22FR (CANNULA) IMPLANT
CANNULA OPTISITE PERFUSION 16F (CANNULA) IMPLANT
CANNULA OPTISITE PERFUSION 18F (CANNULA) IMPLANT
CATH DIAG EXPO 6F VENT PIG 145 (CATHETERS) ×6 IMPLANT
CATH EXPO 5FR AL1 (CATHETERS) ×3 IMPLANT
CATH S G BIP PACING (SET/KITS/TRAYS/PACK) ×6 IMPLANT
CLIP VESOCCLUDE MED 24/CT (CLIP) IMPLANT
CLIP VESOCCLUDE SM WIDE 24/CT (CLIP) IMPLANT
CONT SPEC 4OZ CLIKSEAL STRL BL (MISCELLANEOUS) ×6 IMPLANT
COVER BACK TABLE 60X90IN (DRAPES) ×3 IMPLANT
COVER BACK TABLE 80X110 HD (DRAPES) ×3 IMPLANT
COVER DOME SNAP 22 D (MISCELLANEOUS) ×3 IMPLANT
COVER MAYO STAND STRL (DRAPES) IMPLANT
COVER ULTRASOUND SURGICAL (MISCELLANEOUS) ×3 IMPLANT
CRADLE DONUT ADULT HEAD (MISCELLANEOUS) ×3 IMPLANT
DERMABOND ADVANCED (GAUZE/BANDAGES/DRESSINGS) ×4
DERMABOND ADVANCED .7 DNX12 (GAUZE/BANDAGES/DRESSINGS) ×2 IMPLANT
DEVICE CLOSURE PERCLS PRGLD 6F (VASCULAR PRODUCTS) ×2 IMPLANT
DRAPE INCISE IOBAN 66X45 STRL (DRAPES) IMPLANT
DRAPE SLUSH MACHINE 52X66 (DRAPES) ×3 IMPLANT
DRSG TEGADERM 4X4.75 (GAUZE/BANDAGES/DRESSINGS) ×3 IMPLANT
ELECT REM PT RETURN 9FT ADLT (ELECTROSURGICAL) ×6
ELECTRODE REM PT RTRN 9FT ADLT (ELECTROSURGICAL) ×2 IMPLANT
FELT TEFLON 6X6 (MISCELLANEOUS) IMPLANT
FEMORAL VENOUS CANN RAP (CANNULA) IMPLANT
GAUZE SPONGE 4X4 12PLY STRL (GAUZE/BANDAGES/DRESSINGS) ×3 IMPLANT
GLOVE BIO SURGEON STRL SZ 6.5 (GLOVE) ×2 IMPLANT
GLOVE BIO SURGEON STRL SZ7.5 (GLOVE) ×3 IMPLANT
GLOVE BIO SURGEON STRL SZ8 (GLOVE) ×3 IMPLANT
GLOVE BIO SURGEONS STRL SZ 6.5 (GLOVE) ×1
GLOVE BIOGEL PI IND STRL 6.5 (GLOVE) ×5 IMPLANT
GLOVE BIOGEL PI IND STRL 8 (GLOVE) ×2 IMPLANT
GLOVE BIOGEL PI INDICATOR 6.5 (GLOVE) ×10
GLOVE BIOGEL PI INDICATOR 8 (GLOVE) ×4
GLOVE ECLIPSE 8.0 STRL XLNG CF (GLOVE) ×3 IMPLANT
GLOVE EUDERMIC 7 POWDERFREE (GLOVE) IMPLANT
GLOVE ORTHO TXT STRL SZ7.5 (GLOVE) ×3 IMPLANT
GOWN STRL REUS W/ TWL LRG LVL3 (GOWN DISPOSABLE) ×3 IMPLANT
GOWN STRL REUS W/ TWL XL LVL3 (GOWN DISPOSABLE) ×4 IMPLANT
GOWN STRL REUS W/TWL LRG LVL3 (GOWN DISPOSABLE) ×6
GOWN STRL REUS W/TWL XL LVL3 (GOWN DISPOSABLE) ×8
GUIDEWIRE SAF TJ AMPL .035X180 (WIRE) ×3 IMPLANT
GUIDEWIRE SAFE TJ AMPLATZ EXST (WIRE) ×3 IMPLANT
GUIDEWIRE STRAIGHT .035 260CM (WIRE) ×3 IMPLANT
INSERT FOGARTY 61MM (MISCELLANEOUS) IMPLANT
INSERT FOGARTY SM (MISCELLANEOUS) IMPLANT
INSERT FOGARTY XLG (MISCELLANEOUS) IMPLANT
KIT BASIN OR (CUSTOM PROCEDURE TRAY) ×3 IMPLANT
KIT DILATOR VASC 18G NDL (KITS) IMPLANT
KIT HEART LEFT (KITS) ×3 IMPLANT
KIT ROOM TURNOVER OR (KITS) ×3 IMPLANT
KIT SUCTION CATH 14FR (SUCTIONS) IMPLANT
NEEDLE PERC 18GX7CM (NEEDLE) ×3 IMPLANT
NS IRRIG 1000ML POUR BTL (IV SOLUTION) ×12 IMPLANT
PACK AORTA (CUSTOM PROCEDURE TRAY) ×3 IMPLANT
PAD ARMBOARD 7.5X6 YLW CONV (MISCELLANEOUS) ×6 IMPLANT
PAD ELECT DEFIB RADIOL ZOLL (MISCELLANEOUS) ×3 IMPLANT
PATCH TACHOSII LRG 9.5X4.8 (VASCULAR PRODUCTS) IMPLANT
PERCLOSE PROGLIDE 6F (VASCULAR PRODUCTS) ×6
SET MICROPUNCTURE 5F STIFF (MISCELLANEOUS) ×3 IMPLANT
SHEATH AVANTI 11CM 8FR (MISCELLANEOUS) ×3 IMPLANT
SHEATH PINNACLE 6F 10CM (SHEATH) ×6 IMPLANT
SLEEVE REPOSITIONING LENGTH 30 (MISCELLANEOUS) ×3 IMPLANT
SPONGE LAP 4X18 X RAY DECT (DISPOSABLE) IMPLANT
STOPCOCK MORSE 400PSI 3WAY (MISCELLANEOUS) ×6 IMPLANT
SUT ETHIBOND X763 2 0 SH 1 (SUTURE) IMPLANT
SUT GORETEX CV 4 TH 22 36 (SUTURE) IMPLANT
SUT GORETEX CV4 TH-18 (SUTURE) IMPLANT
SUT GORETEX TH-18 36 INCH (SUTURE) IMPLANT
SUT MNCRL AB 3-0 PS2 18 (SUTURE) IMPLANT
SUT PROLENE 3 0 SH1 36 (SUTURE) IMPLANT
SUT PROLENE 4 0 RB 1 (SUTURE)
SUT PROLENE 4-0 RB1 .5 CRCL 36 (SUTURE) IMPLANT
SUT PROLENE 5 0 C 1 36 (SUTURE) IMPLANT
SUT PROLENE 6 0 C 1 30 (SUTURE) IMPLANT
SUT SILK  1 MH (SUTURE) ×2
SUT SILK 1 MH (SUTURE) ×1 IMPLANT
SUT SILK 2 0 SH CR/8 (SUTURE) IMPLANT
SUT VIC AB 2-0 CT1 27 (SUTURE)
SUT VIC AB 2-0 CT1 TAPERPNT 27 (SUTURE) IMPLANT
SUT VIC AB 2-0 CTX 36 (SUTURE) IMPLANT
SUT VIC AB 3-0 SH 8-18 (SUTURE) IMPLANT
SYR 10ML LL (SYRINGE) ×9 IMPLANT
SYR 30ML LL (SYRINGE) ×6 IMPLANT
SYR 50ML LL SCALE MARK (SYRINGE) ×3 IMPLANT
TOWEL OR 17X26 10 PK STRL BLUE (TOWEL DISPOSABLE) ×6 IMPLANT
TRANSDUCER W/STOPCOCK (MISCELLANEOUS) ×6 IMPLANT
TRAY FOLEY SILVER 14FR TEMP (SET/KITS/TRAYS/PACK) IMPLANT
TUBE SUCT INTRACARD DLP 20F (MISCELLANEOUS) IMPLANT
TUBING HIGH PRESSURE 120CM (CONNECTOR) ×3 IMPLANT
VALVE HEART TRANSCATH SZ3 29MM (Prosthesis & Implant Heart) ×3 IMPLANT
WIRE AMPLATZ SS-J .035X180CM (WIRE) ×3 IMPLANT
WIRE BENTSON .035X145CM (WIRE) ×3 IMPLANT
WIRE HI TORQ VERSACORE J 260CM (WIRE) ×3 IMPLANT

## 2017-01-22 NOTE — Anesthesia Preprocedure Evaluation (Signed)
Anesthesia Evaluation  Patient identified by MRN, date of birth, ID band Patient awake    Reviewed: Allergy & Precautions, NPO status , Patient's Chart, lab work & pertinent test results  Airway Mallampati: II  TM Distance: >3 FB Neck ROM: Full    Dental  (+) Edentulous Lower, Edentulous Upper   Pulmonary neg pulmonary ROS,    breath sounds clear to auscultation       Cardiovascular hypertension, Pt. on medications + Peripheral Vascular Disease  + Valvular Problems/Murmurs AS  Rhythm:Regular Rate:Normal + Systolic murmurs    Neuro/Psych negative neurological ROS  negative psych ROS   GI/Hepatic negative GI ROS, Neg liver ROS,   Endo/Other  Hypothyroidism   Renal/GU negative Renal ROS     Musculoskeletal negative musculoskeletal ROS (+)   Abdominal   Peds  Hematology  (+) anemia ,   Anesthesia Other Findings   Reproductive/Obstetrics                             Anesthesia Physical Anesthesia Plan  ASA: IV  Anesthesia Plan: MAC   Post-op Pain Management:    Induction:   PONV Risk Score and Plan: 1  Airway Management Planned: Nasal Cannula  Additional Equipment:   Intra-op Plan:   Post-operative Plan:   Informed Consent: I have reviewed the patients History and Physical, chart, labs and discussed the procedure including the risks, benefits and alternatives for the proposed anesthesia with the patient or authorized representative who has indicated his/her understanding and acceptance.   Dental advisory given  Plan Discussed with: Surgeon and CRNA  Anesthesia Plan Comments:         Anesthesia Quick Evaluation

## 2017-01-22 NOTE — Transfer of Care (Signed)
Immediate Anesthesia Transfer of Care Note  Patient: Charles Patrick  Procedure(s) Performed: TRANSCATHETER AORTIC VALVE REPLACEMENT, TRANSFEMORAL using Edwards 29 Sapien 3 Aortic Valve (N/A Chest) TRANSESOPHAGEAL ECHOCARDIOGRAM (TEE) (N/A Chest)  Patient Location: ICU  Anesthesia Type:MAC  Level of Consciousness: awake, alert  and oriented  Airway & Oxygen Therapy: Patient Spontanous Breathing and Patient connected to face mask oxygen  Post-op Assessment: Report given to RN and Post -op Vital signs reviewed and stable  Post vital signs: Reviewed and stable  Last Vitals:  Vitals:   01/22/17 0823  BP: 129/88  Pulse: (!) 58  Resp: 19  Temp: (!) 36.4 C  SpO2: 98%    Last Pain:  Vitals:   01/22/17 0823  TempSrc: Oral      Patients Stated Pain Goal: 1 (46/65/99 3570)  Complications: No apparent anesthesia complications

## 2017-01-22 NOTE — Anesthesia Procedure Notes (Signed)
Arterial Line Insertion Start/End11/27/2018 9:00 AM, 01/22/2017 9:17 AM Performed by: Adair LaundryPaxton, Naija Troost A, CRNA, CRNA  Patient location: Pre-op. Preanesthetic checklist: patient identified, IV checked, risks and benefits discussed, surgical consent and monitors and equipment checked Lidocaine 1% used for infiltration Right, radial was placed Catheter size: 20 G Hand hygiene performed , maximum sterile barriers used  and Seldinger technique used  Attempts: 1 Procedure performed without using ultrasound guided technique. Following insertion, dressing applied and Biopatch. Post procedure assessment: normal  Patient tolerated the procedure well with no immediate complications.

## 2017-01-22 NOTE — Anesthesia Procedure Notes (Signed)
Central Venous Catheter Insertion Performed by: Val EagleMoser, Keitra Carusone, MD, anesthesiologist Start/End11/27/2018 10:01 AM, 01/22/2017 10:08 AM Patient location: Pre-op. Preanesthetic checklist: patient identified, IV checked, site marked, risks and benefits discussed, surgical consent, monitors and equipment checked, pre-op evaluation, timeout performed and anesthesia consent Lidocaine 1% used for infiltration and patient sedated Hand hygiene performed  and maximum sterile barriers used  Catheter size: 8 Fr Total catheter length 16. Central line was placed.Double lumen Procedure performed using ultrasound guided technique. Ultrasound Notes:anatomy identified, needle tip was noted to be adjacent to the nerve/plexus identified, no ultrasound evidence of intravascular and/or intraneural injection and image(s) printed for medical record Attempts: 1 Following insertion, dressing applied, line sutured and Biopatch. Post procedure assessment: blood return through all ports, free fluid flow and no air  Patient tolerated the procedure well with no immediate complications.

## 2017-01-22 NOTE — Interval H&P Note (Signed)
History and Physical Interval Note:  01/22/2017 9:54 AM  Charles Patrick  has presented today for surgery, with the diagnosis of severe aortic stenosis  The various methods of treatment have been discussed with the patient and family. After consideration of risks, benefits and other options for treatment, the patient has consented to  Procedure(s): TRANSCATHETER AORTIC VALVE REPLACEMENT, TRANSFEMORAL (N/A) TRANSESOPHAGEAL ECHOCARDIOGRAM (TEE) (N/A) as a surgical intervention .  The patient's history has been reviewed, patient examined, no change in status, stable for surgery.  I have reviewed the patient's chart and labs.  Questions were answered to the patient's satisfaction.     Purcell Nailslarence H Quinto Tippy

## 2017-01-22 NOTE — Op Note (Signed)
[facility]ART [facility]ASC[facility]   MULTIDISCIPLINARY HEART VALVE TEAM   TAVR OPERATIVE NOTE   Date of Procedure:  01/22/2017  Preoperative Diagnosis: Severe Aortic Stenosis   Postoperative Diagnosis: Same   Procedure:    Transcatheter Aortic Valve Replacement - Percutaneous Right Transfemoral Approach  Edwards Sapien 3 THV (size 29 mm, model # 9600TFX, serial # [id number])   Co-Surgeons:  Salvatore Decentlarence H. Cornelius Moraswen, MD and Tonny BollmanMichael Cooper, MD  Anesthesiologist:  Corky Soxhris Moser, MD  Echocardiographer:  Maurine CanePete Nishan, MD  Pre-operative Echo Findings:  Severe aortic stenosis  Normal left ventricular systolic function  Post-operative Echo Findings:  No paravalvular leak  Normal left ventricular systolic function   BRIEF CLINICAL NOTE AND INDICATIONS FOR SURGERY  Patient is an 83 year old African-American male with history of severe aortic stenosis, hypertension, hyperlipidemia, peripheral vascular disease, and hypothyroidism who returns to the office today for follow-up of severe aortic stenosis. He was originally seen in consultation on October 08, 2016. At that time the patient remained remarkably active for a gentleman his age and reported absolutely no symptoms of exertional shortness of breath or chest discomfort. He underwent formal cardiopulmonary exercise testing that revealed remarkably mild circulatory impairment in his exercise tolerance. Stress echocardiography confirmed the presence of severe aortic stenosis with peak velocity across the aortic valve increased from 4.2 m/s at rest to greater than 5 m/s with exercise. Left ventricular function was normal and the patient did not experience significant decline in blood pressure with exercise. At that time the patient remains somewhat reluctant to consider any type of elective surgical intervention. He was referred for dental service consultation and underwent uncomplicated dental extraction by Dr. Robin SearingKulinsky. He was last seen in follow-up  here on November 05, 2016. Since then he has been seen in follow-up by [name] and he returns to our office today for follow-up. He underwent repeat transthoracic echocardiogram earlier today demonstrating further progression and severity of disease with peak velocity across the aortic valve at rest measured 5 m/s corresponding to a mean transvalvular gradient estimated 61 mmHg. Left ventricular systolic function remain normal and the DVI was reported 0.24.    During the course of the patient's preoperative work up they have been evaluated comprehensively by a multidisciplinary team of specialists coordinated through the [facility].  They have been demonstrated to suffer from symptomatic severe aortic stenosis as noted above. The patient has been counseled extensively as to the relative risks and benefits of all options for the treatment of severe aortic stenosis including long term medical therapy, conventional surgery for aortic valve replacement, and transcatheter aortic valve replacement.  All questions have been answered, and the patient provides full informed consent for the operation as described.   DETAILS OF THE OPERATIVE PROCEDURE  PREPARATION:    The patient is brought to the operating room on the above mentioned date and central monitoring was established by the anesthesia team including placement of a central venous line and radial arterial line. The patient is placed in the supine position on the operating table.  Intravenous antibiotics are administered. The patient is monitored closely throughout the procedure under conscious sedation.  Baseline transthoracic echocardiogram was performed. The patient's chest, abdomen, both groins, and both lower extremities are prepared and draped in a sterile manner. A time out procedure is performed.   PERIPHERAL ACCESS:    Using the modified Seldinger technique, femoral  arterial and venous access was obtained with placement of  6 Fr sheaths on the left side.  A pigtail diagnostic catheter was passed through the left arterial sheath under fluoroscopic guidance into the aortic root.  A temporary transvenous pacemaker catheter was passed through the left femoral venous sheath under fluoroscopic guidance into the right ventricle.  The pacemaker was tested to ensure stable lead placement and pacemaker capture. Aortic root angiography was performed in order to determine the optimal angiographic angle for valve deployment.   TRANSFEMORAL ACCESS:   Percutaneous transfemoral access and sheath placement was performed using ultrasound guidance.  The right common femoral artery was cannulated using a micropuncture needle and appropriate location was verified using hand injection angiogram.  A pair of Abbott Perclose percutaneous closure devices were placed and a 6 French sheath replaced into the femoral artery.  The patient was heparinized systemically and ACT verified > 250 seconds.    A 16 Fr transfemoral E-sheath was introduced into the right common femoral artery after progressively dilating over an Amplatz superstiff wire. An AL-1 catheter was used to direct a straight-tip exchange length wire across the native aortic valve into the left ventricle. This was exchanged out for a pigtail catheter and position was confirmed in the LV apex. Simultaneous LV and Ao pressures were recorded.  The pigtail catheter was exchanged for an Amplatz Extra-stiff wire in the LV apex.  Echocardiography was utilized to confirm appropriate wire position and no sign of entanglement in the mitral subvalvular apparatus.   TRANSCATHETER HEART VALVE DEPLOYMENT:   An Edwards Sapien 3 transcatheter heart valve (size 29 mm, model #9600TFX, serial #[id number]) was prepared and crimped per manufacturer's guidelines, and the proper orientation of the valve is confirmed on the [employer] Commander delivery system.  The valve was advanced through the introducer sheath using normal technique until in an appropriate position in the abdominal aorta beyond the sheath tip. The balloon was then retracted and using the fine-tuning wheel was centered on the valve. The valve was then advanced across the aortic arch using appropriate flexion of the catheter. The valve was carefully positioned across the aortic valve annulus. The Commander catheter was retracted using normal technique. Once final position of the valve has been confirmed by angiographic assessment, the valve is deployed while temporarily holding ventilation and during rapid ventricular pacing to maintain systolic blood pressure < 50 mmHg and pulse pressure < 10 mmHg. The balloon inflation is held for >3 seconds after reaching full deployment volume. Once the balloon has fully deflated the balloon is retracted into the ascending aorta and valve function is assessed using echocardiography. There is felt to be no paravalvular leak and no central aortic insufficiency.  The patient's hemodynamic recovery following valve deployment is good.  The deployment balloon and guidewire are both removed.    PROCEDURE COMPLETION:   The sheath was removed and femoral artery closure performed, securing the Perclose sutures.  Protamine was administered once femoral arterial repair was complete. The temporary pacemaker, pigtail catheters and femoral sheaths were removed with manual pressure used for hemostasis.   The patient tolerated the procedure well and is transported to the surgical intensive care in stable condition. There were no immediate intraoperative complications. All sponge instrument and needle counts are verified correct at completion of the operation.   No blood products were administered during the operation.  The patient received a total of 40.7 mL of intravenous contrast during the procedure.   Purcell Nailslarence H Owen, MD 01/22/2017 12:12 PM

## 2017-01-22 NOTE — Progress Notes (Addendum)
TCTS BRIEF SICU PROGRESS NOTE  Day of Surgery  S/P Procedure(s) (LRB): TRANSCATHETER AORTIC VALVE REPLACEMENT, TRANSFEMORAL using Edwards 29 Sapien 3 Aortic Valve (N/A) TRANSESOPHAGEAL ECHOCARDIOGRAM (TEE) (N/A)   Doing well Sinus brady w/ stable BP off drips Breathing comfortably on room air Both groins okay  Plan: Continue routine post-TAVR  Purcell Nailslarence H Issiac Jamar, MD 01/22/2017 5:21 PM

## 2017-01-22 NOTE — Progress Notes (Signed)
2D Echocardiogram has been performed.  Pieter PartridgeBrooke S Azure Budnick 01/22/2017, 11:56 AM

## 2017-01-22 NOTE — Op Note (Signed)
[facility]   MULTIDISCIPLINARY HEART VALVE TEAM   TAVR OPERATIVE NOTE   Date of Procedure:  01/22/2017  Preoperative Diagnosis: Severe Aortic Stenosis   Postoperative Diagnosis: Same   Procedure:    Transcatheter Aortic Valve Replacement - Percutaneous  Transfemoral Approach  Edwards Sapien 3 THV (size 29 mm, model # 9600TFX, serial # [id number])   Co-Surgeons:  Salvatore Decentlarence H. Cornelius Moraswen, MD and Tonny BollmanMichael Elsia Lasota, MD  Anesthesiologist:  Corky Soxhris Moser, MD  Echocardiographer:  Charlton HawsPeter Nishan, MD  Pre-operative Echo Findings:  Severe aortic stenosis  Normal left ventricular systolic function  Post-operative Echo Findings:  No paravalvular leak  Normal left ventricular systolic function  BRIEF CLINICAL NOTE AND INDICATIONS FOR SURGERY  83 yo active male who has developed severe symptomatic aortic stenosis (Stage D disease). Most recent echo studies have shown progressive aortic stenosis with peak velocity > 5 m/s and mean gradient > 60 mmHg consistent with critical AS. After review of treatment options and extensive pre-operative evaluation with cardiac cath and CTA studies, the patient is referred for transfemoral TAVR.  Following the decision to proceed with transcatheter aortic valve replacement, a discussion has been held regarding what types of management strategies would be attempted intraoperatively in the event of life-threatening complications, including whether or not the patient would be considered a candidate for the use of cardiopulmonary bypass and/or conversion to open sternotomy for attempted surgical intervention.  The patient has been advised of a variety of complications that might develop peculiar to this approach including but not limited to risks of death, stroke, paravalvular leak, aortic dissection or other major vascular complications, aortic annulus rupture, device embolization, cardiac rupture or perforation, acute myocardial infarction, arrhythmia, heart  block or bradycardia requiring permanent pacemaker placement, congestive heart failure, respiratory failure, renal failure, pneumonia, infection, other late complications related to structural valve deterioration or migration, or other complications that might ultimately cause a temporary or permanent loss of functional independence or other long term morbidity.  The patient provides full informed consent for the procedure as described and all questions were answered preoperatively.  DETAILS OF THE OPERATIVE PROCEDURE  PREPARATION:   The patient is brought to the operating room on the above mentioned date and central monitoring was established by the anesthesia team including placement of a central venous catheter and radial arterial line. The patient is placed in the supine position on the operating table.  Intravenous antibiotics are administered. The patient is monitored closely throughout the procedure under conscious sedation.  Baseline transthoracic echocardiogram is performed. The patient's chest, abdomen, both groins, and both lower extremities are prepared and draped in a sterile manner. A time out procedure is performed.   PERIPHERAL ACCESS:   Using ultrasound guidance, femoral arterial and venous access is obtained with placement of 6 Fr sheaths on the left side.  A pigtail diagnostic catheter was passed through the femoral arterial sheath under fluoroscopic guidance into the aortic root.  A temporary transvenous pacemaker catheter was passed through the femoral venous sheath under fluoroscopic guidance into the right ventricle.  The pacemaker was tested to ensure stable lead placement and pacemaker capture. Aortic root angiography was performed in order to determine the optimal angiographic angle for valve deployment.  TRANSFEMORAL ACCESS:  A micropuncture technique is used to access the right femoral artery under fluoroscopic and ultrasound guidance.  2 Perclose devices are deployed at 10'  and 2' positions to 'PreClose' the femoral artery. An 8 French sheath is placed and then an Amplatz Superstiff  wire is advanced through the sheath. This is changed out for a 16 French transfemoral E-Sheath after progressively dilating over the Superstiff wire.  An AL-2 catheter was used to direct a straight-tip exchange length wire across the native aortic valve into the left ventricle. This was exchanged out for a pigtail catheter and position was confirmed in the LV apex. Simultaneous LV and Ao pressures were recorded.  The pigtail catheter was exchanged for an Amplatz Extra-stiff wire in the LV apex.  Echocardiography was utilized to confirm appropriate wire position and no sign of entanglement in the mitral subvalvular apparatus.  TRANSCATHETER HEART VALVE DEPLOYMENT:  An Edwards Sapien 3 transcatheter heart valve (size 29 mm, model #9600TFX, serial #[id number]) was prepared and crimped per manufacturer's guidelines, and the proper orientation of the valve is confirmed on the [employer] Commander delivery system. The valve was advanced through the introducer sheath using normal technique until in an appropriate position in the abdominal aorta beyond the sheath tip. The balloon was then retracted and using the fine-tuning wheel was centered on the valve. The valve was then advanced across the aortic arch using appropriate flexion of the catheter. The valve was carefully positioned across the aortic valve annulus. The Commander catheter was retracted using normal technique. Once final position of the valve has been confirmed by angiographic assessment, the valve is deployed while temporarily holding ventilation and during rapid ventricular pacing to maintain systolic blood pressure < 50 mmHg and pulse pressure < 10 mmHg. The balloon inflation is held for >3 seconds after reaching full deployment volume. Once the balloon has fully deflated the balloon is retracted into the ascending aorta and valve function is  assessed using echocardiography. There is felt to be no paravalvular leak and no central aortic insufficiency.  The patient's hemodynamic recovery following valve deployment is good.  The deployment balloon and guidewire are both removed. Echo demostrated acceptable post-procedural gradients, stable mitral valve function, and no aortic insufficiency.  PROCEDURE COMPLETION:  The sheath was removed and femoral artery closure is performed using the 2 previously deployed Perclose devices.  Protamine is administered once femoral arterial repair was complete. The site is clear with no evidence of bleeding or hematoma after the sutures are tightened. The temporary pacemaker, pigtail catheters and femoral sheaths were removed with manual pressure used for hemostasis.   The patient tolerated the procedure well and is transported to the surgical intensive care in stable condition. There were no immediate intraoperative complications. All sponge instrument and needle counts are verified correct at completion of the operation.   The patient received a total of 40 mL of intravenous contrast during the procedure.  Tonny BollmanMichael Daymond Cordts, MD 01/22/2017 1:31 PM

## 2017-01-22 NOTE — Progress Notes (Signed)
  [facility]ASC[facility]ENTER   MULTIDISCIPLINARY HEART VALVE TEAM  Patient doing well s/p TAVR. He is hemodynamically stable. Groin sites stable. ECG with sinus brady and new LBBB but no high grade block. Plan to DC arterial line. K has been supplemented. Early ambulation and plan to transfer to tele tomorrow.   Cline CrockKathryn Eleina Jergens PA-C  [facility]  Pager [phone]

## 2017-01-23 ENCOUNTER — Inpatient Hospital Stay (HOSPITAL_COMMUNITY): Payer: Medicare HMO

## 2017-01-23 ENCOUNTER — Other Ambulatory Visit: Payer: Self-pay

## 2017-01-23 ENCOUNTER — Encounter (HOSPITAL_COMMUNITY): Payer: Self-pay | Admitting: *Deleted

## 2017-01-23 DIAGNOSIS — D649 Anemia, unspecified: Secondary | ICD-10-CM | POA: Diagnosis present

## 2017-01-23 DIAGNOSIS — I361 Nonrheumatic tricuspid (valve) insufficiency: Secondary | ICD-10-CM

## 2017-01-23 DIAGNOSIS — I350 Nonrheumatic aortic (valve) stenosis: Secondary | ICD-10-CM

## 2017-01-23 DIAGNOSIS — Z954 Presence of other heart-valve replacement: Secondary | ICD-10-CM

## 2017-01-23 LAB — CBC
HCT: 29.4 % — ABNORMAL LOW (ref 39.0–52.0)
Hemoglobin: 9.1 g/dL — ABNORMAL LOW (ref 13.0–17.0)
MCH: 21.5 pg — ABNORMAL LOW (ref 26.0–34.0)
MCHC: 31 g/dL (ref 30.0–36.0)
MCV: 69.5 fL — ABNORMAL LOW (ref 78.0–100.0)
PLATELETS: 166 10*3/uL (ref 150–400)
RBC: 4.23 MIL/uL (ref 4.22–5.81)
RDW: 15.3 % (ref 11.5–15.5)
WBC: 7.2 10*3/uL (ref 4.0–10.5)

## 2017-01-23 LAB — ECHOCARDIOGRAM COMPLETE
AV Area VTI index: 1.52 cm2/m2
AV Area mean vel: 2.62 cm2
AV Mean grad: 21 mmHg
AV Peak grad: 34 mmHg
AV area mean vel ind: 1.27 cm2/m2
AV peak Index: 1.53
AVAREAVTI: 3.15 cm2
AVCELMEANRAT: 0.69
AVLVOTPG: 24 mmHg
AVPKVEL: 293 cm/s
Ao pk vel: 0.83 m/s
Ao-asc: 42 cm
CHL CUP AV VALUE AREA INDEX: 1.52
CHL CUP AV VEL: 3.13
CHL CUP DOP CALC LVOT VTI: 48.6 cm
CHL CUP PV REG GRAD DIAS: 8 mmHg
CHL CUP RV SYS PRESS: 46 mmHg
CHL CUP TV REG PEAK VELOCITY: 329 cm/s
DOP CAL AO MEAN VELOCITY: 216 cm/s
E decel time: 317 msec
E/e' ratio: 10.85
FS: 44 % (ref 28–44)
HEIGHTINCHES: 76 in
IVS/LV PW RATIO, ED: 1.25
LA diam index: 1.8 cm/m2
LA vol A4C: 54.2 ml
LA vol index: 27.5 mL/m2
LA vol: 56.6 mL
LASIZE: 37 mm
LDCA: 3.8 cm2
LEFT ATRIUM END SYS DIAM: 37 mm
LV E/e' medial: 10.85
LV E/e'average: 10.85
LV TDI E'LATERAL: 7.29
LVELAT: 7.29 cm/s
LVOT SV: 185 mL
LVOT peak VTI: 0.82 cm
LVOT peak vel: 243 cm/s
LVOTD: 22 mm
MV Dec: 317
MV pk A vel: 109 m/s
MVPG: 3 mmHg
MVPKEVEL: 79.1 m/s
PV Reg vel dias: 137 cm/s
PW: 9.83 mm — AB (ref 0.6–1.1)
RV TAPSE: 18.5 mm
TDI e' medial: 5
TR max vel: 329 cm/s
VTI: 59 cm
Valve area: 3.13 cm2
Weight: 2816 oz

## 2017-01-23 LAB — BASIC METABOLIC PANEL
Anion gap: 5 (ref 5–15)
BUN: 7 mg/dL (ref 6–20)
CALCIUM: 8.3 mg/dL — AB (ref 8.9–10.3)
CO2: 25 mmol/L (ref 22–32)
CREATININE: 0.88 mg/dL (ref 0.61–1.24)
Chloride: 107 mmol/L (ref 101–111)
Glucose, Bld: 100 mg/dL — ABNORMAL HIGH (ref 65–99)
Potassium: 3.6 mmol/L (ref 3.5–5.1)
SODIUM: 137 mmol/L (ref 135–145)

## 2017-01-23 LAB — MAGNESIUM: MAGNESIUM: 1.8 mg/dL (ref 1.7–2.4)

## 2017-01-23 MED FILL — Magnesium Sulfate Inj 50%: INTRAMUSCULAR | Qty: 10 | Status: AC

## 2017-01-23 MED FILL — Heparin Sodium (Porcine) Inj 1000 Unit/ML: INTRAMUSCULAR | Qty: 30 | Status: AC

## 2017-01-23 MED FILL — Phenylephrine HCl Inj 10 MG/ML: INTRAMUSCULAR | Qty: 1 | Status: AC

## 2017-01-23 MED FILL — Potassium Chloride Inj 2 mEq/ML: INTRAVENOUS | Qty: 20 | Status: AC

## 2017-01-23 MED FILL — Sodium Chloride IV Soln 0.9%: INTRAVENOUS | Qty: 250 | Status: AC

## 2017-01-23 NOTE — Care Management Note (Signed)
Case Management Note Donn PieriniKristi Maxwell Lemen RN, BSN Unit 4E-Case Manager-- 2H coverage [phone]  Patient Details  Name: Charles KaufmannDavid B Patrick MRN: [id number] Date of Birth: April 16, 1924  Subjective/Objective:  Pt admitted s/p TAVR                  Action/Plan:  PTA pt lived at home - anticipate return home- CM to follow  Expected Discharge Date:                  Expected Discharge Plan:  Home/Self Care  In-House Referral:     Discharge planning Services  CM Consult  Post Acute Care Choice:    Choice offered to:     DME Arranged:    DME Agency:     HH Arranged:    HH Agency:     Status of Service:  In process, will continue to follow  If discussed at Long Length of Stay Meetings, dates discussed:    Discharge Disposition:   Additional Comments:  Charles Patrick, Charles Hege Hall, RN 01/23/2017, 12:02 PM

## 2017-01-23 NOTE — Progress Notes (Signed)
[facility] VASC[facility]ENTER   MULTIDISCIPLINARY HEART VALVE TEAM  Patient Name: Charles Patrick Date of Encounter: 01/23/2017  Primary Cardiologist[name]  Hospital Problem List     Principal Problem:   S/P TAVR (transcatheter aortic valve replacement) Active Problems:   Severe aortic stenosis   HTN (hypertension)   HLD (hyperlipidemia)   Hypothyroidism   Anemia    Subjective   No complaints. Feeling great. Walked around and felt like his chest was much lighter.   Inpatient Medications    Scheduled Meds: . acetaminophen (TYLENOL) oral liquid 160 mg/5 mL  650 mg Per Tube Once   Or  . acetaminophen  650 mg Rectal Once  . aspirin EC  81 mg Oral Daily   Or  . aspirin  81 mg Per Tube Daily  . Chlorhexidine Gluconate Cloth  6 each Topical Daily  . clopidogrel  75 mg Oral Q breakfast  . levothyroxine  50 mcg Oral QAC breakfast  . pantoprazole  40 mg Oral Daily  . simvastatin  40 mg Oral q1800  . sodium chloride flush  10-40 mL Intracatheter Q12H  . tamsulosin  0.4 mg Oral QPC supper   Continuous Infusions: . sodium chloride Stopped (01/23/17 0446)  . albumin human    . cefUROXime (ZINACEF)  IV Stopped (01/22/17 2133)  . lactated ringers    . nitroGLYCERIN Stopped (01/22/17 1215)   PRN Meds: acetaminophen, albumin human, lactated ringers, metoprolol tartrate, morphine injection, ondansetron (ZOFRAN) IV, sodium chloride flush   Vital Signs    Vitals:   01/23/17 0600 01/23/17 0700 01/23/17 0800 01/23/17 0841  BP: 97/72 100/74 112/82   Pulse: 69 70 66   Resp: 13 17 19    Temp:    98.8 F (37.1 C)  TempSrc:    Oral  SpO2: 95% 94% 98%   Weight:      Height:        Intake/Output Summary (Last 24 hours) at 01/23/2017 0849 Last data filed at 01/23/2017 0800 Gross per 24 hour  Intake 2313.13 ml  Output 1275 ml  Net 1038.13 ml   Filed Weights   01/22/17 1230 01/22/17 2000 01/23/17 0430  Weight: 176 lb (79.8 kg) 178 lb 1.6 oz (80.8 kg) 176 lb (79.8 kg)      Physical Exam   GEN: Well nourished, well developed, in no acute distress. Appears younger than stated age HEENT: Grossly normal.  Neck: Supple, no JVD, carotid bruits, or masses. Cardiac: RRR, no murmurs, rubs, or gallops. No clubbing, cyanosis, edema.  Radials/DP/PT 2+ and equal bilaterally.  Respiratory:  Respirations regular and unlabored, clear to auscultation bilaterally. GI: Soft, nontender, nondistended, BS + x 4. MS: no deformity or atrophy. Skin: warm and dry, no rash. Groin sites stable.  Neuro:  Strength and sensation are intact. Psych: AAOx3.  Normal affect.  Labs    CBC Recent Labs    01/22/17 1221 01/22/17 1228 01/23/17 0329  WBC 6.8  --  7.2  HGB 9.2* 9.5* 9.1*  HCT 29.5* 28.0* 29.4*  MCV 70.1*  --  69.5*  PLT 175  --  166   Basic Metabolic Panel Recent Labs    16/11/9609/27/18 1156 01/22/17 1228 01/23/17 0329  NA 140 141 137  K 3.4* 3.4* 3.6  CL 108  --  107  CO2  --   --  25  GLUCOSE 119* 116* 100*  BUN 10  --  7  CREATININE 0.60*  --  0.88  CALCIUM  --   --  8.3*  MG  --   --  1.8   Liver Function Tests No results for input(s): AST, ALT, ALKPHOS, BILITOT, PROT, ALBUMIN in the last 72 hours. No results for input(s): LIPASE, AMYLASE in the last 72 hours. Cardiac Enzymes No results for input(s): CKTOTAL, CKMB, CKMBINDEX, TROPONINI in the last 72 hours. BNP Invalid input(s): POCBNP D-Dimer No results for input(s): DDIMER in the last 72 hours. Hemoglobin A1C No results for input(s): HGBA1C in the last 72 hours. Fasting Lipid Panel No results for input(s): CHOL, HDL, LDLCALC, TRIG, CHOLHDL, LDLDIRECT in the last 72 hours. Thyroid Function Tests No results for input(s): TSH, T4TOTAL, T3FREE, THYROIDAB in the last 72 hours.  Invalid input(s): FREET3  Telemetry    NSR with some sinus brady. HR currently 67 - Personally Reviewed  ECG    NSR, IVCD, inferior/ anterolat Q waves  - Personally Reviewed  Radiology    Dg Chest Port 1  View  Result Date: 01/22/2017 CLINICAL DATA:  Transcatheter aortic valve repair . EXAM: PORTABLE CHEST 1 VIEW COMPARISON:  01/18/2017 . FINDINGS: Right IJ line noted with its tip projected over cavoatrial junction. Cardiac valve replacement noted with a horizontal orientation over the lower heart. Clinical correlation suggested. Cardiomegaly with normal pulmonary vascularity . Mild basilar atelectasis. Small bilateral pleural effusions cannot be excluded . IMPRESSION: 1. Right IJ line noted with tip projected over the cavoatrial junction. 2. Cardiac valve replacement noted with a horizontal orientation of the lower heart. Clinical correlation suggested. Cardiomegaly with normal pulmonary vascularity. 3. Mild basilar atelectasis. Small bilateral pleural effusions cannot be excluded . Electronically Signed   By: Maisie Fus  Register   On: 01/22/2017 12:45    Cardiac Studies    TAVR OPERATIVE NOTE   Date of Procedure:                01/22/2017  Preoperative Diagnosis:      Severe Aortic Stenosis   Postoperative Diagnosis:    Same   Procedure:        Transcatheter Aortic Valve Replacement - Percutaneous  Transfemoral Approach             Edwards Sapien 3 THV (size 29 mm, model # 9600TFX, serial # [id number])              Co-Surgeons:                        Salvatore Decent. Cornelius Moras, MD and Tonny Bollman, MD  Pre-operative Echo Findings: ? Severe aortic stenosis ? Normal left ventricular systolic function  Post-operative Echo Findings: ? No paravalvular leak ? Normal left ventricular systolic function  ____________________  Post operative echo pending today   Patient Profile     Charles Patrick is a 83 y.o. male with a history of hypertension, hyperlipidemia, hypothyroidism, and severe aortic stenosis who presented to [facility] On 01/22/17 for planned TAVR.  Assessment & Plan    Severe AS s/p TAVR: s/p successful TAVR with a 29mm Edwards Sapein THV on 01/22/17. Post operative echo pending.  Doing excellent POD 1. Groin sites are stable. ECG with NSR and IVCD but no high grade heart block. Continue ASA and plavix. Discontinue central line and transfer to the floor. Hopefully DC home tomorrow.   HTN: well controlled off all antihypertensives  HLD: continue statin   Hypothyroidism: continue synthroid  Anemia: Hg has remained stable   Signed, Cline Crock, PA-C  01/23/2017, 8:49 AM  Pager [phone]   I  have seen and examined the patient and agree with the assessment and plan as outlined.  Doing well s/p TAVR.  Routine ECHO today.  Anticipate likely D/C home tomorrow.  Purcell Nailslarence H Owen, MD 01/23/2017 1:19 PM

## 2017-01-23 NOTE — Progress Notes (Signed)
  Echocardiogram 2D Echocardiogram has been performed.  Kaye Luoma G Kenly Henckel 01/23/2017, 11:18 AM

## 2017-01-23 NOTE — Plan of Care (Signed)
  Progressing Education: Ability to demonstrate proper wound care will improve 01/23/2017 0116 - Progressing by Alonna Buckler, RN Knowledge of disease or condition will improve 01/23/2017 0116 - Progressing by Alonna Buckler, RN Knowledge of the prescribed therapeutic regimen will improve 01/23/2017 0116 - Progressing by Alonna Buckler, RN Activity: Risk for activity intolerance will decrease 01/23/2017 0116 - Progressing by Alonna Buckler, RN Cardiac: Hemodynamic stability will improve 01/23/2017 0116 - Progressing by Alonna Buckler, RN Clinical Measurements: Postoperative complications will be avoided or minimized 01/23/2017 0116 - Progressing by Alonna Buckler, RN Note Bilateral groin sites remain level 0, distal pulses +2 bilaterally. Respiratory: Respiratory status will improve 01/23/2017 0116 - Progressing by Alonna Buckler, RN Skin Integrity: Wound healing without signs and symptoms of infection 01/23/2017 0116 - Progressing by Alonna Buckler, RN Risk for impaired skin integrity will decrease 01/23/2017 0116 - Progressing by Alonna Buckler, RN Urinary Elimination: Ability to achieve and maintain adequate renal perfusion and functioning will improve 01/23/2017 0116 - Progressing by Alonna Buckler, RN Health Behavior/Discharge Planning: Ability to manage health-related needs will improve 01/23/2017 0116 - Progressing by Alonna Buckler, RN Nutrition: Adequate nutrition will be maintained 01/23/2017 0116 - Progressing by Alonna Buckler, RN Pain Managment: General experience of comfort will improve 01/23/2017 0116 - Progressing by Alonna Buckler, RN Note Pt denies any pain this shift.  Safety: Ability to remain free from injury will improve 01/23/2017 0116 - Progressing by Alonna Buckler, RN   Completed/Met Coping: Level of anxiety will decrease 01/23/2017 0116 - Completed/Met by Alonna Buckler, RN

## 2017-01-23 NOTE — Progress Notes (Signed)
CARDIAC REHAB PHASE I   PRE:  Rate/Rhythm: 77 SR    BP: sitting 99/70    SaO2: 97 RA  MODE:  Ambulation: 470 ft   POST:  Rate/Rhythm: 98 SR    BP: sitting 99/87     SaO2: 97 RA  Pt eager to walk. Got out of bed and walked a little in room but slightly unsteady so decided to use RW. Steady with RW but does push it out in front of him too far. Steady and observant on turns. To bed after walk, no c/o. Pt has RW at home. Will f/u tomorrow to walk again. [phone]   Charles MassonRandi Kristan Chanele Patrick CES, ACSM 01/23/2017 3:37 PM

## 2017-01-24 ENCOUNTER — Other Ambulatory Visit: Payer: Self-pay

## 2017-01-24 DIAGNOSIS — Z952 Presence of prosthetic heart valve: Secondary | ICD-10-CM

## 2017-01-24 DIAGNOSIS — I350 Nonrheumatic aortic (valve) stenosis: Secondary | ICD-10-CM

## 2017-01-24 MED ORDER — CLOPIDOGREL BISULFATE 75 MG PO TABS: 75.0000 mg | ORAL_TABLET | Freq: Every day | ORAL | 5 refills | Status: DC

## 2017-01-24 NOTE — Progress Notes (Signed)
CARDIAC REHAB PHASE I   PRE:  Rate/Rhythm: 73 SR    BP: sitting 119/65    SaO2: 98 RA  MODE:  Ambulation: 470 ft   POST:  Rate/Rhythm: 82 SR    BP: sitting 115/74     SaO2: 98 RA  Gave pt instructions on staying closer to RW today and he did much better. Did not need any more reminders to stay close, steady, no c/o walking. Ambulated independently with RW. Will need one for home (I misunderstood yesterday, they do not have a RW). Discussed restrictions, walking daily, and CRPII. [facility].  [id number]   Harriet MassonRandi Kristan Carlen Rebuck CES, ACSM 01/24/2017 9:28 AM

## 2017-01-24 NOTE — Care Management (Signed)
1019 01-24-17 S/P TAVR-from home with wife. Plans to return home with family support. [facility] DME to be delivered to room prior to d/c. Gala LewandowskyGraves-Bigelow, Hassani Sliney Kaye, RN,BSN [phone]

## 2017-01-24 NOTE — Discharge Instructions (Signed)

## 2017-01-24 NOTE — Progress Notes (Signed)
Discharge summary reviewed with pt. All questions addressed and no concerns expressed by pt at this time. No change since previous assessment. PIV removed and pt discharged via wheelchair to discharge lounge to await son for transportation.

## 2017-01-24 NOTE — Discharge Summary (Signed)
[facility]   MULTIDISCIPLINARY HEART VALVE TEAM   Discharge Summary    Patient ID: Charles Patrick,  MRN: [id number], DOB/AGE: 1924-05-16 83 y.o.  Admit date: 01/22/2017 Discharge date: 01/24/2017  Primary Care Provider: [name], [name] Primary Cardiologist: [name] PA-C / Dr. Excell Seltzerooper & [name] (TAVR)  Discharge Diagnoses    Principal Problem:   S/P TAVR (transcatheter aortic valve replacement) Active Problems:   Severe aortic stenosis   HTN (hypertension)   HLD (hyperlipidemia)   Hypothyroidism   Anemia   Allergies No Known Allergies   History of Present Illness    Charles KaufmannDavid B Patrick is a 83 y.o. male with a history of hypertension, hyperlipidemia, hypothyroidism, and severe aortic stenosis who [facility] planned [facility]R.  He has been followed for many years by [name] [facility]. Serial echocardiograms have shown progression of his aortic stenosis and the most recent echo there on 09/03/2016 showed a mean transvalvular gradient of 55 mm Hg and an aortic valve area of 0.6 cm2. LVEF was 60-65%. He was seen by [name] in August and denied any symptoms. He had an echo stress test on 10/22/2016 showing critical AS with baseline peak and mean transaortic gradients of 66/39 mm Hg with exercise gradients of 103/58 mm Hg. The aortic valve was severely thickened and calcified with severely restricted leaflet opening. He also had CPX testing the same day which showed no pulmonary limitation with probable mild circulatory limitation. His most recent echo on 01/07/2017 showed a mean gradient of 66 mm Hg and a peak of 110 mm Hg. The ascending aorta was moderately dilated at 46 mm Hg. LVEF was 65-70% with moderate concentric LVH and G1DD. Cardiac cath on 11/16 showed mild non-obstructive CAD with normal right heart pressures. The mean gradient was 48 mm Hg and peak to peak was 69 mm Hg. AVA of 0.88 cm2. He was seen by Dr. [name] and underwent  extraction of his remaining teeth and alveoloplasty.  He was evaluated by the multidisciplinary valve team and ultimately deemed to be a good candidate for TAVR, which was set up for 01/22/17.   Hospital Course     Consultants: none  Severe AS s/p TAVR: s/p successful TAVR with a 29mm Edwards Sapein THV on 01/22/17. Post operative echo shows good TAVR placement with mean/peak gradient 21 / 35 mm Hg; no PVL. Doing excellent POD 2. Groin sites are stable. ECG with NSR and IVCD but no high grade heart block. He had a little unsteadiness when up walking around and a rolling walker has been ordered. Continue ASA and plavix. I will see back in the office next week for TOC visit.   HTN: well controlled off all antihypertensives  HLD: continue statin   Hypothyroidism: continue synthroid  Anemia: Hg has remained stable    The patient has had an uncomplicated hospital course and is recovering well. The femoral catheter sites are stable. He has been deemed ready for discharge home by [name]. All follow-up appointments have been scheduled. Discharge medications are listed below.  _____________  Discharge Vitals Blood pressure 94/71, pulse 72, temperature 98.5 F (36.9 C), temperature source Oral, resp. rate (!) 24, height 6\' 4"  (1.93 m), weight 173 lb 1.6 oz (78.5 kg), SpO2 97 %.  Filed Weights   01/22/17 2000 01/23/17 0430 01/24/17 0624  Weight: 178 lb 1.6 oz (80.8 kg) 176 lb (79.8 kg) 173 lb 1.6 oz (78.5 kg)  VS:  BP 94/71 (BP Location: Left Arm)   Pulse 72   Temp 98.5 F (36.9 C) (Oral)   Resp (!) 24   Ht 6\' 4"  (1.93 m)   Wt 173 lb 1.6 oz (78.5 kg)   SpO2 97%   BMI 21.07 kg/m    GEN: Well nourished, well developed, in no acute distress, appears younger than stated age.  HEENT: normal  Neck: no JVD, carotid bruits, or masses Cardiac: RRR; no murmurs, rubs, or gallops,no edema  Respiratory:  clear to auscultation bilaterally, normal work of breathing GI: soft, nontender,  nondistended, + BS MS: no deformity or atrophy  Skin: warm and dry, no rash Neuro:  Alert and Oriented x 3, Strength and sensation are intact Psych: euthymic mood, full affect   Labs & Radiologic Studies     CBC Recent Labs    01/22/17 1221 01/22/17 1228 01/23/17 0329  WBC 6.8  --  7.2  HGB 9.2* 9.5* 9.1*  HCT 29.5* 28.0* 29.4*  MCV 70.1*  --  69.5*  PLT 175  --  166   Basic Metabolic Panel Recent Labs    16/11/9609/27/18 1156 01/22/17 1228 01/23/17 0329  NA 140 141 137  K 3.4* 3.4* 3.6  CL 108  --  107  CO2  --   --  25  GLUCOSE 119* 116* 100*  BUN 10  --  7  CREATININE 0.60*  --  0.88  CALCIUM  --   --  8.3*  MG  --   --  1.8   Liver Function Tests No results for input(s): AST, ALT, ALKPHOS, BILITOT, PROT, ALBUMIN in the last 72 hours. No results for input(s): LIPASE, AMYLASE in the last 72 hours. Cardiac Enzymes No results for input(s): CKTOTAL, CKMB, CKMBINDEX, TROPONINI in the last 72 hours. BNP Invalid input(s): POCBNP D-Dimer No results for input(s): DDIMER in the last 72 hours. Hemoglobin A1C No results for input(s): HGBA1C in the last 72 hours. Fasting Lipid Panel No results for input(s): CHOL, HDL, LDLCALC, TRIG, CHOLHDL, LDLDIRECT in the last 72 hours. Thyroid Function Tests No results for input(s): TSH, T4TOTAL, T3FREE, THYROIDAB in the last 72 hours.  Invalid input(s): FREET3  Dg Chest 2 View  Result Date: 01/18/2017 CLINICAL DATA:  Pre-op for TAVR on 11/27. Pt denies all chest complaints at this time. Hx multiple heart conditions. EXAM: CHEST - 2 VIEW COMPARISON:  CT 10/25/2016 FINDINGS: Lungs are clear. Heart size normal.  Tortuous ectatic thoracic aorta. No effusion.  No pneumothorax. Old left rib fractures. IMPRESSION: 1. No acute disease. 2. Tortuous ectatic thoracic aorta. Electronically Signed   By: Corlis Leak  Hassell M.D.   On: 01/18/2017 12:00   Dg Chest Port 1 View  Result Date: 01/22/2017 CLINICAL DATA:  Transcatheter aortic valve repair . EXAM:  PORTABLE CHEST 1 VIEW COMPARISON:  01/18/2017 . FINDINGS: Right IJ line noted with its tip projected over cavoatrial junction. Cardiac valve replacement noted with a horizontal orientation over the lower heart. Clinical correlation suggested. Cardiomegaly with normal pulmonary vascularity . Mild basilar atelectasis. Small bilateral pleural effusions cannot be excluded . IMPRESSION: 1. Right IJ line noted with tip projected over the cavoatrial junction. 2. Cardiac valve replacement noted with a horizontal orientation of the lower heart. Clinical correlation suggested. Cardiomegaly with normal pulmonary vascularity. 3. Mild basilar atelectasis. Small bilateral pleural effusions cannot be excluded . Electronically Signed   By: Maisie Fushomas  Register   On: 01/22/2017 12:45     Diagnostic Studies/Procedures  TAVR OPERATIVE NOTE Date of Procedure:01/22/2017  Procedure:   Transcatheter Aortic Valve Replacement - Percutaneous Transfemoral Approach Edwards Sapien 3 THV (size 29mm, model # 9600TFX, serial # [id number])  Co-Surgeons:Clarence H. Cornelius Moras, MD and Tonny Bollman, MD  Pre-operative Echo Findings: ? Severe aortic stenosis ? Normalleft ventricular systolic function  Post-operative Echo Findings: ? Noparavalvular leak ? Normalleft ventricular systolic function   _____________   Post operative echo: 01/23/17 Study Conclusions - Left ventricle: Wall thickness was increased in a pattern of mild   LVH. Systolic function was normal. The estimated ejection   fraction was in the range of 60% to 65%. Doppler parameters are   consistent with abnormal left ventricular relaxation (grade 1   diastolic dysfunction). - Aortic valve: Valve struts appear fully expanded on PSL axis   images S/P TAVR with 29 mm Sapien 3 gradients have increased   since OR no significant peri valvular regurgitation. Valve area   (VTI): 3.13 cm^2. Valve  area (Vmax): 3.15 cm^2. Valve area   (Vmean): 2.62 cm^2. - Left atrium: The atrium was mildly dilated. - Atrial septum: No defect or patent foramen ovale was identified. - Pulmonary arteries: PA peak pressure: 46 mm Hg (S).   Disposition   Pt is being discharged home today in good condition.  Follow-up Plans & Appointments    Follow-up Information    Janetta Hora, PA-C. Go on 01/30/2017.   Specialties:  Cardiology, Radiology Why:  @ 1pm for your post hospital follow up. This is different office then Dr. Lorrene Reid information: [address] [place] [address] [phone]          Discharge Instructions    Amb Referral to Cardiac Rehabilitation   Complete by:  As directed    To [place]   Diagnosis:  Valve Replacement   Valve:  Aortic Comment - TAVR   Diet - low sodium heart healthy   Complete by:  As directed    Increase activity slowly   Complete by:  As directed       Discharge Medications     Medication List    TAKE these medications   aspirin EC 81 MG tablet Take 81 mg by mouth daily.   clopidogrel 75 MG tablet Commonly known as:  PLAVIX Take 1 tablet (75 mg total) by mouth daily with breakfast.   levothyroxine 50 MCG tablet Commonly known as:  SYNTHROID, LEVOTHROID Take 50 mcg by mouth daily before breakfast.   simvastatin 40 MG tablet Commonly known as:  ZOCOR Take 40 mg by mouth daily at 6 PM.   tamsulosin 0.4 MG Caps capsule Commonly known as:  FLOMAX Take 0.4 mg by mouth daily after supper.        Outstanding Labs/Studies   none  Duration of Discharge Encounter   Greater than 30 minutes including physician time.  Signed, Cline Crock PA-C 01/24/2017, 9:52 AM

## 2017-01-25 ENCOUNTER — Telehealth: Payer: Self-pay | Admitting: Physician Assistant

## 2017-01-25 NOTE — Anesthesia Postprocedure Evaluation (Signed)
Anesthesia Post Note  Patient: Charles Patrick  Procedure(s) Performed: TRANSCATHETER AORTIC VALVE REPLACEMENT, TRANSFEMORAL using Edwards 29 Sapien 3 Aortic Valve (N/A Chest) TRANSESOPHAGEAL ECHOCARDIOGRAM (TEE) (N/A Chest)     Patient location during evaluation: ICU Anesthesia Type: MAC Level of consciousness: awake and alert Pain management: pain level controlled Vital Signs Assessment: post-procedure vital signs reviewed and stable Respiratory status: spontaneous breathing, nonlabored ventilation, respiratory function stable and patient connected to nasal cannula oxygen Cardiovascular status: stable Postop Assessment: no apparent nausea or vomiting Anesthetic complications: no    Last Vitals:  Vitals:   01/24/17 0624 01/24/17 1100  BP: 94/71 97/69  Pulse: 72 72  Resp: (!) 24 19  Temp: 36.9 C   SpO2: 97% 99%    Last Pain:  Vitals:   01/24/17 0800  TempSrc:   PainSc: 0-No pain                 Charles Patrick

## 2017-01-25 NOTE — Telephone Encounter (Signed)
  [facility]ENTER   MULTIDISCIPLINARY HEART VALVE TEAM  [facility].  Patient understands to follow up with provider Carlean JewsKatie Bryam Taborda PA-C on 12/5 at 1:30pm at [address]. Patient understands discharge instructions? Yes Patient understands medications and regiment? Yes Patient understands to bring all medications to this visit? Yes  Cline CrockKathryn Charonda Hefter PA-C  [facility]

## 2017-01-28 ENCOUNTER — Other Ambulatory Visit: Payer: Self-pay

## 2017-01-28 NOTE — Progress Notes (Signed)
[facility]ART AND VASC[facility]ENTER   MULTIDISCIPLINARY HEART VALVE CLINIC                                       Cardiology Office Note    Date:  01/30/2017   ID:  Charles Kaufmannavid B Duma, DOB 12/24/1924, MRN [id number]  PCP:  [name], [name], PA-C  Cardiologist: [name] PA-C / Dr. Excell Seltzerooper & [name] (TAVR)  [facility]   History of Present Illness:  Charles Patrick is a 83 y.o. male with a history of hypertension, hyperlipidemia, hypothyroidism, andsevere aortic stenosis s/p TAVR (01/22/17) who presents to clinic for follow up.   He has been followed for many years by [name] PA-C [facility]. Serial echocardiograms have shown progression of his aortic stenosis and the most recent echo there on 09/03/2016 showed a mean transvalvular gradient of 55 mm Hg and an aortic valve area of 0.6 cm2. LVEF was 60-65%. He was seen by [name] in August and denied any symptoms. He had an echo stress test on 10/22/2016 showing critical AS with baseline peak and mean transaortic gradients of 66/39 mm Hg with exercise gradients of 103/58 mm Hg. The aortic valve was severely thickened and calcified with severely restricted leaflet opening. He also had CPX testing the same day which showed no pulmonary limitation with probable mild circulatory limitation. His most recent echo on 01/07/2017 showed a mean gradient of 66 mm Hg and a peak of 110 mm Hg. The ascending aorta was moderately dilated at 46 mm Hg. LVEF was 65-70% with moderate concentric LVH and G1DD. Cardiac cath on 11/16 showed mild non-obstructive CAD with normal right heart pressures. The mean gradient was 48 mm Hg and peak to peak was 69 mm Hg. AVA of 0.88 cm2. He was seen by Dr. [name] and underwent extraction of his remaining teeth and alveoloplasty.  He underwent successful TAVR with a 29mm Edwards Sapein THV on 01/22/17. Post operative echo showed good TAVR placement with mean/peak gradient 21 / 35 mm Hg; no PVL. He was dischaged on ASA and plavix.     Today he presents to clinic for follow up. He has been feeling quite well. Feels like a weight has been lifted off his chest when he walks and talks. No CP or SOB. No LE edema, orthopnea or PND. No dizziness or syncope. No blood in stool or urine. No palpitations.    Past Medical History:  Diagnosis Date  . Anemia   . Aortic valve stenosis   . Arthropathy   . BPH associated with nocturia   . Carotid artery disease (HCC)   . Colon polyp   . Dilated aortic root (HCC)   . Elevated PSA   . Erectile dysfunction   . Gastric paresis   . Hypercholesterolemia   . Hyperglycemia   . Hypertension    Patient denies  . Hypothyroid   . Iron deficiency   . Pulmonary hypertension (HCC)   . 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 Seizure disorder (HCC)   . Thrombocytopenia (HCC)   . Thyroid nodule   . Tricuspid regurgitation   . Vitamin D deficiency     Past Surgical History:  Procedure Laterality Date  . MULTIPLE EXTRACTIONS WITH ALVEOLOPLASTY N/A 10/17/2016   Procedure:  Extraction of tooth #'s 4, 21,22,27,28,29 with alveoloplasty;  Surgeon: Charlynne Pander, DDS;  Location: [facility];  Service: Oral Surgery;  Laterality: N/A;  . NO PAST SURGERIES    . RIGHT/LEFT HEART CATH AND CORONARY ANGIOGRAPHY N/A 01/11/2017   Procedure: RIGHT/LEFT HEART CATH AND CORONARY ANGIOGRAPHY;  Surgeon: Tonny Bollman, MD;  Location: [facility] INVASIVE CV LAB;  Service: Cardiovascular;  Laterality: N/A;  . TEE WITHOUT CARDIOVERSION N/A 01/22/2017   Procedure: TRANSESOPHAGEAL ECHOCARDIOGRAM (TEE);  Surgeon: Tonny Bollman, MD;  Location: [facility] OR;  Service: Open Heart Surgery;  Laterality: N/A;  . TRANSCATHETER AORTIC VALVE REPLACEMENT, TRANSFEMORAL N/A 01/22/2017   Procedure: TRANSCATHETER AORTIC VALVE REPLACEMENT, TRANSFEMORAL using Edwards 29 Sapien 3 Aortic Valve;  Surgeon: Tonny Bollman,  MD;  Location: [facility] OR;  Service: Open Heart Surgery;  Laterality: N/A;    Current Medications: Outpatient Medications Prior to Visit  Medication Sig Dispense Refill  . aspirin EC 81 MG tablet Take 81 mg by mouth daily.     . clopidogrel (PLAVIX) 75 MG tablet Take 1 tablet (75 mg total) by mouth daily with breakfast. 30 tablet 5  . levothyroxine (SYNTHROID, LEVOTHROID) 50 MCG tablet Take 50 mcg by mouth daily before breakfast.     . simvastatin (ZOCOR) 40 MG tablet Take 40 mg by mouth daily at 6 PM.     . tamsulosin (FLOMAX) 0.4 MG CAPS capsule Take 0.4 mg by mouth daily after supper.      No facility-administered medications prior to visit.      Allergies:   Patient has no known allergies.   Social History   Socioeconomic History  . Marital status: Married    Spouse name: None  . Number of children: 3  . Years of education: None  . Highest education level: None  Social Needs  . Financial resource strain: None  . Food insecurity - worry: None  . Food insecurity - inability: None  . Transportation needs - medical: None  . Transportation needs - non-medical: None  Occupational History  . None  Tobacco Use  . Smoking status: Never Smoker  . Smokeless tobacco: Never Used  Substance and Sexual Activity  . Alcohol use: No  . Drug use: No  . Sexual activity: Not Currently  Other Topics Concern  . None  Social History Narrative  . None     Family History:  The patient's family history includes Healthy in his father and mother.      ROS:   Please see the history of present illness.    ROS All other systems reviewed and are negative.   PHYSICAL EXAM:   VS:  BP 112/86   Pulse 68   Ht 6\' 4"  (1.93 m)   Wt 175 lb (79.4 kg)   SpO2 97%   BMI 21.30 kg/m    GEN: Well nourished, well developed, in no acute distress, appears younger than his stated age HEENT: normal  Neck: no JVD, carotid bruits, or masses Cardiac: RRR; no murmurs, rubs, or gallops,no edema  Respiratory:   clear to auscultation bilaterally, normal work of breathing GI: soft, nontender, nondistended, + BS MS: no deformity or atrophy  Skin: warm and dry, no rash Neuro:  Alert and Oriented x 3, Strength and sensation are intact Psych: euthymic mood, full affect    Wt Readings from Last 3 Encounters:  01/30/17 175 lb (79.4 kg)  01/24/17 173 lb 1.6 oz (78.5 kg)  01/18/17 178 lb 1.6 oz (80.8 kg)  Studies/Labs Reviewed:   EKG:  EKG is ordered today.  The ekg ordered today demonstrates NSR with LBBB and 1st degree AV black. HR 68  Recent Labs: 01/18/2017: ALT 11; B Natriuretic Peptide 109.0 01/23/2017: BUN 7; Creatinine, Ser 0.88; Hemoglobin 9.1; Magnesium 1.8; Platelets 166; Potassium 3.6; Sodium 137   Lipid Panel No results found for: CHOL, TRIG, HDL, CHOLHDL, VLDL, LDLCALC, LDLDIRECT  Additional studies/ records that were reviewed today include:  TAVR OPERATIVE NOTE Date of Procedure:01/22/2017  Procedure:   Transcatheter Aortic Valve Replacement - Percutaneous Transfemoral Approach Edwards Sapien 3 THV (size 29mm, model # 9600TFX, serial # [id number])  Co-Surgeons:Clarence H. Cornelius Moraswen, MD and Tonny BollmanMichael Cooper, MD  Pre-operative Echo Findings: ? Severe aortic stenosis ? Normalleft ventricular systolic function  Post-operative Echo Findings: ? Noparavalvular leak ? Normalleft ventricular systolic function   _____________   Post operative echo: 01/23/17 Study Conclusions - Left ventricle: Wall thickness was increased in a pattern of mild LVH. Systolic function was normal. The estimated ejection fraction was in the range of 60% to 65%. Doppler parameters are consistent with abnormal left ventricular relaxation (grade 1 diastolic dysfunction). - Aortic valve: Valve struts appear fully expanded on PSL axis images S/P TAVR with 29 mm Sapien 3 gradients have increased since OR no significant  peri valvular regurgitation. Valve area (VTI): 3.13 cm^2. Valve area (Vmax): 3.15 cm^2. Valve area (Vmean): 2.62 cm^2. - Left atrium: The atrium was mildly dilated. - Atrial septum: No defect or patent foramen ovale was identified. - Pulmonary arteries: PA peak pressure: 46 mm Hg (S).   ASSESSMENT & PLAN:   Severe AS s/p TAVR: doing excellent s/p TAVR. Groin sites are stable. ECG with new LBBB but no high grade block. 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 Continue ASA and plavix. Amoxil Rx provided for SBE prophylaxis. I will see him back at 1 month post op with an echo.   HTN: BP is well controlled on no mediacations  HLD: continue statin   Hypothyroidism: continue sythroid  Anemia: this has remained stable with Hg around 9.1   Medication Adjustments/Labs and Tests Ordered: Current medicines are reviewed at length with the patient today.  Concerns regarding medicines are outlined above.  Medication changes, Labs and Tests ordered today are listed in the Patient Instructions below. Patient Instructions  Medication Instructions:  Your physician recommends that you continue on your current medications as directed. Please refer to the Current Medication list given to you today. Take Amoxicillin 2000 mg by mouth one hour prior to any dental appointments and stomach or bladder procedure.  Labwork: none  Testing/Procedures: Echo as planned on 02/13/17  Follow-Up: Follow up with K. Janee Mornhompson, [place] as planned on 02/13/17  Any Other Special Instructions Will Be Listed Below (If Applicable).     If you need a refill on your cardiac medications before your next appointment, please call your pharmacy.      Signed, Cline CrockKathryn Rashida Ladouceur, PA-C  01/30/2017 2:16 PM    [facility] [address], [place], [place]  [address] Phone: [phone]; Fax: [phone]

## 2017-01-30 ENCOUNTER — Ambulatory Visit (INDEPENDENT_AMBULATORY_CARE_PROVIDER_SITE_OTHER): Payer: Medicare HMO | Admitting: Physician Assistant

## 2017-01-30 ENCOUNTER — Encounter: Payer: Self-pay | Admitting: Physician Assistant

## 2017-01-30 VITALS — BP 112/86 | HR 68 | Ht 76.0 in | Wt 175.0 lb

## 2017-01-30 DIAGNOSIS — E039 Hypothyroidism, unspecified: Secondary | ICD-10-CM

## 2017-01-30 DIAGNOSIS — I10 Essential (primary) hypertension: Secondary | ICD-10-CM | POA: Diagnosis not present

## 2017-01-30 DIAGNOSIS — Z952 Presence of prosthetic heart valve: Secondary | ICD-10-CM | POA: Diagnosis not present

## 2017-01-30 DIAGNOSIS — D649 Anemia, unspecified: Secondary | ICD-10-CM | POA: Diagnosis not present

## 2017-01-30 DIAGNOSIS — E785 Hyperlipidemia, unspecified: Secondary | ICD-10-CM

## 2017-01-30 MED ORDER — AMOXICILLIN 500 MG PO TABS: ORAL_TABLET | ORAL | 3 refills | Status: DC

## 2017-01-30 NOTE — Patient Instructions (Addendum)
Medication Instructions:  Your physician recommends that you continue on your current medications as directed. Please refer to the Current Medication list given to you today. Take Amoxicillin 2000 mg by mouth one hour prior to any dental appointments and stomach or bladder procedure.  Labwork: none  Testing/Procedures: Echo as planned on 02/13/17  Follow-Up: Follow up with K. Janee Mornhompson, [place] as planned on 02/13/17  Any Other Special Instructions Will Be Listed Below (If Applicable).     If you need a refill on your cardiac medications before your next appointment, please call your pharmacy.

## 2017-02-04 ENCOUNTER — Encounter: Payer: Self-pay | Admitting: Thoracic Surgery (Cardiothoracic Vascular Surgery)

## 2017-02-13 ENCOUNTER — Ambulatory Visit (HOSPITAL_COMMUNITY): Payer: Medicare HMO | Attending: Internal Medicine

## 2017-02-13 ENCOUNTER — Other Ambulatory Visit: Payer: Self-pay

## 2017-02-13 ENCOUNTER — Ambulatory Visit (INDEPENDENT_AMBULATORY_CARE_PROVIDER_SITE_OTHER): Payer: Medicare HMO | Admitting: Physician Assistant

## 2017-02-13 ENCOUNTER — Encounter: Payer: Self-pay | Admitting: Physician Assistant

## 2017-02-13 VITALS — BP 110/68 | HR 60 | Ht 76.0 in | Wt 173.5 lb

## 2017-02-13 DIAGNOSIS — Z952 Presence of prosthetic heart valve: Secondary | ICD-10-CM | POA: Diagnosis not present

## 2017-02-13 DIAGNOSIS — E039 Hypothyroidism, unspecified: Secondary | ICD-10-CM | POA: Diagnosis not present

## 2017-02-13 DIAGNOSIS — E785 Hyperlipidemia, unspecified: Secondary | ICD-10-CM | POA: Diagnosis not present

## 2017-02-13 DIAGNOSIS — D649 Anemia, unspecified: Secondary | ICD-10-CM

## 2017-02-13 DIAGNOSIS — I350 Nonrheumatic aortic (valve) stenosis: Secondary | ICD-10-CM | POA: Insufficient documentation

## 2017-02-13 DIAGNOSIS — I10 Essential (primary) hypertension: Secondary | ICD-10-CM

## 2017-02-13 DIAGNOSIS — I071 Rheumatic tricuspid insufficiency: Secondary | ICD-10-CM | POA: Diagnosis not present

## 2017-02-13 NOTE — Progress Notes (Signed)
HEART AND VASCULAR CENTER   MULTIDISCIPLINARY HEART VALVE CLINIC                                       Cardiology Office Note    Date:  02/14/2017   ID:  Charles Patrick, DOB 01/23/1925, MRN [id number]  PCP:  [name], [name], PA-C  Cardiologist: [name] PA-C / Dr. Excell Seltzerooper & [name] (TAVR)  CC: 1 month follow up s/p TAVR   History of Present Illness:  Charles Patrick is a 83 y.o. male with a history of hypertension, hyperlipidemia, hypothyroidism, andsevere aortic stenosis s/p TAVR (01/22/17) who presents to clinic for follow up.   He has been followed for many years by [name] PA-[facility].Serial echocardiograms have shown progression of his aortic stenosis and the most recent echo there on 09/03/2016 showed a mean transvalvular gradient of 55 mm Hg and an aortic valve area of 0.6 cm2. LVEF was 60-65%. He was seen by [name] in August and denied any symptoms. He had an echo stress test on 10/22/2016 showing critical AS with baseline peak and mean transaortic gradients of 66/39 mm Hg with exercise gradients of 103/58 mm Hg. The aortic valve was severely thickened and calcified with severely restricted leaflet opening. He also had CPX testing the same day which showed no pulmonary limitation with probable mild circulatory limitation. His most recent echo on 01/07/2017 showed a mean gradient of 66 mm Hg and a peak of 110 mm Hg. The ascending aorta was moderately dilated at 46 mm Hg. LVEF was 65-70% with moderate concentric LVH and G1DD. Cardiac cath on 11/16 showed mild non-obstructive CAD with normal right heart pressures. The mean gradient was 48 mm Hg and peak to peak was 69 mm Hg. AVA of 0.88 cm2. He was seen by Dr. [name] and underwent extraction of his remaining teeth and alveoloplasty.  He underwent successful TAVR with a 29mm Edwards Sapein THV on 01/22/17. Post operative echoshowed good TAVR placement with mean/peak gradient 21 / 35 mm Hg; no PVL. He was dischaged on ASA and  plavix. ECG with new LBBB but no high grade block.   Today he presents to clinic for follow up. He has been just feeling so fabulous. He is so happy with his valve surgery. He thinks his chest is feeling lighter and his breathing is much improved. He didn't know that he was so symptomatic until after his surgery. No CP or SOB. No LE edema, orthopnea or PND. No dizziness or syncope. No blood in stool or urine. No palpitations.     Past Medical History:  Diagnosis Date  . Anemia   . Aortic valve stenosis   . Arthropathy   . BPH associated with nocturia   . Carotid artery disease (HCC)   . Colon polyp   . Dilated aortic root (HCC)   . Elevated PSA   . Erectile dysfunction   . Gastric paresis   . Hypercholesterolemia   . Hyperglycemia   . Hypertension    Patient denies  . Hypothyroid   . Iron deficiency   . Pulmonary hypertension (HCC)   . 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 Seizure disorder (HCC)   . Thrombocytopenia (HCC)   . Thyroid nodule   . Tricuspid regurgitation   .  Vitamin D deficiency     Past Surgical History:  Procedure Laterality Date  . MULTIPLE EXTRACTIONS WITH ALVEOLOPLASTY N/A 10/17/2016   Procedure: Extraction of tooth #'s 4, 21,22,27,28,29 with alveoloplasty;  Surgeon: [name], [name], DDS;  [facility];  Service: Oral Surgery;  Laterality: N/A;  . NO PAST SURGERIES    . RIGHT/LEFT HEART CATH AND CORONARY ANGIOGRAPHY N/A 01/11/2017   Procedure: RIGHT/LEFT HEART CATH AND CORONARY ANGIOGRAPHY;  Surgeon: [name], [name], MD;  [facility];  Service: Cardiovascular;  Laterality: N/A;  . TEE WITHOUT CARDIOVERSION N/A 01/22/2017   Procedure: TRANSESOPHAGEAL ECHOCARDIOGRAM (TEE);  Surgeon: [name], [name], MD;  [facility];  Service: Open Heart Surgery;  Laterality: N/A;  . TRANSCATHETER AORTIC VALVE  REPLACEMENT, TRANSFEMORAL N/A 01/22/2017   Procedure: TRANSCATHETER AORTIC VALVE REPLACEMENT, TRANSFEMORAL using Edwards 29 Sapien 3 Aortic Valve;  Surgeon: [name], [name], MD;  [facility];  Service: Open Heart Surgery;  Laterality: N/A;    Current Medications: Outpatient Medications Prior to Visit  Medication Sig Dispense Refill  . amoxicillin (AMOXIL) 500 MG tablet Take 4 tablets by mouth one hour prior to dental appointments and stomach or bladder procedures 4 tablet 3  . aspirin EC 81 MG tablet Take 81 mg by mouth daily.     . clopidogrel (PLAVIX) 75 MG tablet Take 1 tablet (75 mg total) by mouth daily with breakfast. 30 tablet 5  . levothyroxine (SYNTHROID, LEVOTHROID) 50 MCG tablet Take 50 mcg by mouth daily before breakfast.     . simvastatin (ZOCOR) 40 MG tablet Take 40 mg by mouth daily at 6 PM.     . tamsulosin (FLOMAX) 0.4 MG CAPS capsule Take 0.4 mg by mouth daily after supper.      No facility-administered medications prior to visit.      Allergies:   Patient has no known allergies.   Social History   Socioeconomic History  . Marital status: Married    Spouse name: None  . Number of children: 3  . Years of education: None  . Highest education level: None  Social Needs  . Financial resource strain: None  . Food insecurity - worry: None  . Food insecurity - inability: None  . Transportation needs - medical: None  . Transportation needs - non-medical: None  Occupational History  . None  Tobacco Use  . Smoking status: Never Smoker  . Smokeless tobacco: Never Used  Substance and Sexual Activity  . Alcohol use: No  . Drug use: No  . Sexual activity: Not Currently  Other Topics Concern  . None  Social History Narrative  . None     Family History:  The patient's family history includes Healthy in his father and mother.     ROS:   Please see the history of present illness.    ROS All other systems reviewed and are negative.   PHYSICAL EXAM:   VS:  BP  110/68   Pulse 60   Ht 6\' 4"  (1.93 m)   Wt 173 lb 8 oz (78.7 kg)   SpO2 94%   BMI 21.12 kg/m    GEN: Well nourished, well developed, in no acute distress, appears younger than stated age.  HEENT: normal  Neck: no JVD, carotid bruits, or masses Cardiac: RRR; no murmurs, rubs, or gallops,no edema  Respiratory:  clear to auscultation bilaterally, normal work of breathing GI: soft, nontender, nondistended, + BS MS: no deformity or atrophy  Skin: warm and dry, no rash Neuro:  Alert and Oriented x 3, Strength and sensation are intact Psych: euthymic mood, full affect    Wt Readings from Last 3 Encounters:  02/13/17 173 lb 8 oz (78.7 kg)  01/30/17 175 lb (79.4 kg)  01/24/17 173 lb 1.6 oz (78.5 kg)      Studies/Labs Reviewed:   EKG:  EKG is NOT ordered today.    Recent Labs: 01/18/2017: ALT 11; B Natriuretic Peptide 109.0 01/23/2017: BUN 7; Creatinine, Ser 0.88; Hemoglobin 9.1; Magnesium 1.8; Platelets 166; Potassium 3.6; Sodium 137   Lipid Panel No results found for: CHOL, TRIG, HDL, CHOLHDL, VLDL, LDLCALC, LDLDIRECT  Additional studies/ records that were reviewed today include:  TAVR OPERATIVE NOTE Date of Procedure:01/22/2017  Procedure:   Transcatheter Aortic Valve Replacement - Percutaneous Transfemoral Approach Edwards Sapien 3 THV (size 29mm, model # 9600TFX, serial # [id number])  Co-Surgeons:Clarence H. Cornelius Moras, MD and Tonny Bollman, MD  Pre-operative Echo Findings: ? Severe aortic stenosis ? Normalleft ventricular systolic function  Post-operative Echo Findings: ? Noparavalvular leak ? Normalleft ventricular systolic function   _____________   Post operative echo: 01/23/17  Study Conclusions - Left ventricle: Wall thickness was increased in a pattern of mild LVH. Systolic function was normal. The estimated ejection fraction was in the range of 60% to 65%. Doppler parameters  are consistent with abnormal left ventricular relaxation (grade 1 diastolic dysfunction). - Aortic valve: Valve struts appear fully expanded on PSL axis images S/P TAVR with 29 mm Sapien 3 gradients have increased since OR no significant peri valvular regurgitation. Valve area (VTI): 3.13 cm^2. Valve area (Vmax): 3.15 cm^2. Valve area (Vmean): 2.62 cm^2. - Left atrium: The atrium was mildly dilated. - Atrial septum: No defect or patent foramen ovale was identified. - Pulmonary arteries: PA peak pressure: 46 mm Hg (S).  _____________  2D ECHO 02/13/17 (1 month s/p TAVR) Study Conclusions  - Left ventricle: The cavity size was normal. Wall thickness was   increased in a pattern of mild LVH. Systolic function was normal.   The estimated ejection fraction was in the range of 60% to 65%.   Wall motion was normal; there were no regional wall motion   abnormalities. Doppler parameters are consistent with abnormal   left ventricular relaxation (grade 1 diastolic dysfunction). The   E/e&' ratio is between 8-15, suggesting indeterminate LV filing   pressure. - Aortic valve: s/p 29 mm Sapien 3 TAVR. No obstruction. No   paravalvular leak. Mean gradient (S): 14 mm Hg. Peak gradient   (S): 26 mm Hg. Valve area (VTI): 1.95 cm^2. Valve area (Vmax):   1.86 cm^2. Valve area (Vmean): 1.9 cm^2. - Aorta: Ascending aortic diameter: 48 mm (S). - Ascending aorta: The ascending aorta was moderately dilated. - Mitral valve: Mildly thickened leaflets . There was trivial   regurgitation. - Left atrium: The atrium was normal in size. - Atrial septum: No defect or patent foramen ovale was identified. - Tricuspid valve: There was mild regurgitation. - Pulmonary arteries: PA peak pressure: 40 mm Hg (S). - Inferior vena cava: The vessel was normal in size. The   respirophasic diameter changes were in the normal range (>= 50%),   consistent with normal central venous pressure. Impressions: -  Compared to a prior study in 12/2016, the LVEF is unchanged. The   TAVR valve is stable. The ascending aorta measures up to 4.8 cm.   ASSESSMENT & PLAN:   Severe AS s/p TAVR: doing excellent s/p TAVR. He has NHYA class I symptoms. 2D ECHO  shows normally functioning TAVR with no PVL and mean gradient of 14 mm Hg. He has a Rx for Amoxil for SBE prophylaxis. He can stop his plavix after 6 months of therapy (end of May 2017). I will see him back in 1 year with an echo.   Ascending Aortic Aneurysm: echo reports ascending aortic diameter of 4.8 cm. CT angio documented 4.6cm dilatation as well as ectasia of distal aortic arch (3.9 cm in diameter), and aneurysmal dilatation of the left common iliac artery (2.1 cm in diameter). Radiology recommended semi annual imaging with CTA and CTCS follow up. I will discuss management of this with Dr. Cornelius Moras.   HTN: BP well controlled on no antihypertensives.   HLD: continue statin   Hypothyroidism: continue statin   Anemia: this has remained stable.    Medication Adjustments/Labs and Tests Ordered: Current medicines are reviewed at length with the patient today.  Concerns regarding medicines are outlined above.  Medication changes, Labs and Tests ordered today are listed in the Patient Instructions below. Patient Instructions  Medication Instructions:  Your physician recommends that you continue on your current medications as directed. Please refer to the Current Medication list given to you today. Stop Clopidogrel on May 27,2019  Labwork: none  Testing/Procedures: Your physician has requested that you have an echocardiogram. Echocardiography is a painless test that uses sound waves to create images of your heart. It provides your doctor with information about the size and shape of your heart and how well your heart's chambers and valves are working. This procedure takes approximately one hour. There are no restrictions for this procedure.  To be done  in 11 months on day of appointment with K. Janee Morn, [place]  Follow-Up: Follow up with M.Duran, PA as planned   Your physician recommends that you schedule a follow-up appointment in: 11 months with Philomena Course, PA.  We will call you to schedule this appointment and the echocardiogram appointment    Any Other Special Instructions Will Be Listed Below (If Applicable).   The phone number for Dr. Luretha Murphy office is [phone]  If you need a refill on your cardiac medications before your next appointment, please call your pharmacy.      Signed, Cline Crock, PA-C  02/14/2017 10:26 AM    [facility] [address] [place], [place], [place]  [address] Phone: [phone]; Fax: [phone]

## 2017-02-13 NOTE — Patient Instructions (Addendum)
Medication Instructions:  Your physician recommends that you continue on your current medications as directed. Please refer to the Current Medication list given to you today. Stop Clopidogrel on May 27,2019  Labwork: none  Testing/Procedures: Your physician has requested that you have an echocardiogram. Echocardiography is a painless test that uses sound waves to create images of your heart. It provides your doctor with information about the size and shape of your heart and how well your heart's chambers and valves are working. This procedure takes approximately one hour. There are no restrictions for this procedure.  To be done in 11 months on day of appointment with K. Janee Mornhompson, [place]  Follow-Up: Follow up with M.Duran, PA as planned   Your physician recommends that you schedule a follow-up appointment in: 11 months with Philomena CourseK Thompson, PA.  We will call you to schedule this appointment and the echocardiogram appointment    Any Other Special Instructions Will Be Listed Below (If Applicable).   The phone number for Dr. Luretha MurphyKulinski's office is [phone]  If you need a refill on your cardiac medications before your next appointment, please call your pharmacy.

## 2017-03-19 ENCOUNTER — Encounter: Payer: Self-pay | Admitting: Thoracic Surgery (Cardiothoracic Vascular Surgery)

## 2017-08-14 ENCOUNTER — Encounter (HOSPITAL_COMMUNITY): Payer: Self-pay

## 2017-08-14 ENCOUNTER — Emergency Department (HOSPITAL_COMMUNITY): Payer: Medicare HMO

## 2017-08-14 ENCOUNTER — Other Ambulatory Visit: Payer: Self-pay

## 2017-08-14 ENCOUNTER — Emergency Department (HOSPITAL_COMMUNITY): Payer: Medicare HMO | Admitting: Anesthesiology

## 2017-08-14 ENCOUNTER — Encounter (HOSPITAL_COMMUNITY)
Admission: EM | Disposition: A | Discharge status: Skilled Nursing Facility | Payer: Self-pay | Source: Home / Self Care | Attending: Orthopedic Surgery

## 2017-08-14 ENCOUNTER — Inpatient Hospital Stay (HOSPITAL_COMMUNITY)
Admission: EM | Admit: 2017-08-14 | Discharge: 2017-08-23 | DRG: 474 | Disposition: A | Discharge status: Skilled Nursing Facility | Payer: Medicare HMO | Attending: Orthopedic Surgery | Admitting: Orthopedic Surgery

## 2017-08-14 DIAGNOSIS — J181 Lobar pneumonia, unspecified organism: Secondary | ICD-10-CM | POA: Diagnosis not present

## 2017-08-14 DIAGNOSIS — N401 Enlarged prostate with lower urinary tract symptoms: Secondary | ICD-10-CM | POA: Diagnosis present

## 2017-08-14 DIAGNOSIS — E7849 Other hyperlipidemia: Secondary | ICD-10-CM

## 2017-08-14 DIAGNOSIS — Z79899 Other long term (current) drug therapy: Secondary | ICD-10-CM

## 2017-08-14 DIAGNOSIS — D696 Thrombocytopenia, unspecified: Secondary | ICD-10-CM | POA: Diagnosis present

## 2017-08-14 DIAGNOSIS — Y95 Nosocomial condition: Secondary | ICD-10-CM | POA: Diagnosis not present

## 2017-08-14 DIAGNOSIS — E039 Hypothyroidism, unspecified: Secondary | ICD-10-CM | POA: Diagnosis not present

## 2017-08-14 DIAGNOSIS — Z7982 Long term (current) use of aspirin: Secondary | ICD-10-CM

## 2017-08-14 DIAGNOSIS — S82831C Other fracture of upper and lower end of right fibula, initial encounter for open fracture type IIIA, IIIB, or IIIC: Secondary | ICD-10-CM | POA: Diagnosis not present

## 2017-08-14 DIAGNOSIS — IMO0002 Reserved for concepts with insufficient information to code with codable children: Secondary | ICD-10-CM

## 2017-08-14 DIAGNOSIS — S82401C Unspecified fracture of shaft of right fibula, initial encounter for open fracture type IIIA, IIIB, or IIIC: Secondary | ICD-10-CM

## 2017-08-14 DIAGNOSIS — Z952 Presence of prosthetic heart valve: Secondary | ICD-10-CM | POA: Diagnosis not present

## 2017-08-14 DIAGNOSIS — I272 Pulmonary hypertension, unspecified: Secondary | ICD-10-CM | POA: Diagnosis present

## 2017-08-14 DIAGNOSIS — Z7902 Long term (current) use of antithrombotics/antiplatelets: Secondary | ICD-10-CM

## 2017-08-14 DIAGNOSIS — S86021A Laceration of right Achilles tendon, initial encounter: Secondary | ICD-10-CM | POA: Diagnosis present

## 2017-08-14 DIAGNOSIS — D638 Anemia in other chronic diseases classified elsewhere: Secondary | ICD-10-CM | POA: Diagnosis present

## 2017-08-14 DIAGNOSIS — E785 Hyperlipidemia, unspecified: Secondary | ICD-10-CM | POA: Diagnosis present

## 2017-08-14 DIAGNOSIS — R509 Fever, unspecified: Secondary | ICD-10-CM

## 2017-08-14 DIAGNOSIS — R4182 Altered mental status, unspecified: Secondary | ICD-10-CM | POA: Diagnosis present

## 2017-08-14 DIAGNOSIS — E871 Hypo-osmolality and hyponatremia: Secondary | ICD-10-CM | POA: Diagnosis not present

## 2017-08-14 DIAGNOSIS — Z79890 Hormone replacement therapy (postmenopausal): Secondary | ICD-10-CM

## 2017-08-14 DIAGNOSIS — G40909 Epilepsy, unspecified, not intractable, without status epilepticus: Secondary | ICD-10-CM | POA: Diagnosis present

## 2017-08-14 DIAGNOSIS — D62 Acute posthemorrhagic anemia: Secondary | ICD-10-CM

## 2017-08-14 DIAGNOSIS — S8401XA Injury of tibial nerve at lower leg level, right leg, initial encounter: Secondary | ICD-10-CM | POA: Diagnosis present

## 2017-08-14 DIAGNOSIS — S96021A Laceration of muscle and tendon of long flexor muscle of toe at ankle and foot level, right foot, initial encounter: Secondary | ICD-10-CM | POA: Diagnosis present

## 2017-08-14 DIAGNOSIS — S9701XA Crushing injury of right ankle, initial encounter: Secondary | ICD-10-CM | POA: Diagnosis present

## 2017-08-14 DIAGNOSIS — I2510 Atherosclerotic heart disease of native coronary artery without angina pectoris: Secondary | ICD-10-CM | POA: Diagnosis present

## 2017-08-14 DIAGNOSIS — S82871C Displaced pilon fracture of right tibia, initial encounter for open fracture type IIIA, IIIB, or IIIC: Secondary | ICD-10-CM | POA: Diagnosis present

## 2017-08-14 DIAGNOSIS — T1490XA Injury, unspecified, initial encounter: Secondary | ICD-10-CM

## 2017-08-14 DIAGNOSIS — G8918 Other acute postprocedural pain: Secondary | ICD-10-CM

## 2017-08-14 DIAGNOSIS — I739 Peripheral vascular disease, unspecified: Secondary | ICD-10-CM | POA: Diagnosis present

## 2017-08-14 DIAGNOSIS — S82201C Unspecified fracture of shaft of right tibia, initial encounter for open fracture type IIIA, IIIB, or IIIC: Secondary | ICD-10-CM

## 2017-08-14 DIAGNOSIS — S82201B Unspecified fracture of shaft of right tibia, initial encounter for open fracture type I or II: Secondary | ICD-10-CM | POA: Diagnosis present

## 2017-08-14 DIAGNOSIS — T148XXA Other injury of unspecified body region, initial encounter: Secondary | ICD-10-CM

## 2017-08-14 DIAGNOSIS — W28XXXA Contact with powered lawn mower, initial encounter: Secondary | ICD-10-CM

## 2017-08-14 DIAGNOSIS — R6510 Systemic inflammatory response syndrome (SIRS) of non-infectious origin without acute organ dysfunction: Secondary | ICD-10-CM

## 2017-08-14 DIAGNOSIS — Y92007 Garden or yard of unspecified non-institutional (private) residence as the place of occurrence of the external cause: Secondary | ICD-10-CM

## 2017-08-14 DIAGNOSIS — S85171A Laceration of posterior tibial artery, right leg, initial encounter: Secondary | ICD-10-CM | POA: Diagnosis present

## 2017-08-14 DIAGNOSIS — K08409 Partial loss of teeth, unspecified cause, unspecified class: Secondary | ICD-10-CM | POA: Diagnosis present

## 2017-08-14 DIAGNOSIS — Z7901 Long term (current) use of anticoagulants: Secondary | ICD-10-CM

## 2017-08-14 DIAGNOSIS — I10 Essential (primary) hypertension: Secondary | ICD-10-CM | POA: Diagnosis present

## 2017-08-14 DIAGNOSIS — R351 Nocturia: Secondary | ICD-10-CM | POA: Diagnosis present

## 2017-08-14 DIAGNOSIS — Z953 Presence of xenogenic heart valve: Secondary | ICD-10-CM

## 2017-08-14 HISTORY — PX: EXTERNAL FIXATION LEG: SHX1549

## 2017-08-14 HISTORY — PX: INCISION AND DRAINAGE: SHX5863

## 2017-08-14 LAB — CBC WITH DIFFERENTIAL/PLATELET
Abs Immature Granulocytes: 0 10*3/uL (ref 0.0–0.1)
BASOS ABS: 0.1 10*3/uL (ref 0.0–0.1)
BASOS PCT: 1 %
EOS ABS: 0.3 10*3/uL (ref 0.0–0.7)
Eosinophils Relative: 2 %
HCT: 34.2 % — ABNORMAL LOW (ref 39.0–52.0)
Hemoglobin: 10.2 g/dL — ABNORMAL LOW (ref 13.0–17.0)
Immature Granulocytes: 0 %
Lymphocytes Relative: 34 %
Lymphs Abs: 4.1 10*3/uL — ABNORMAL HIGH (ref 0.7–4.0)
MCH: 21.3 pg — AB (ref 26.0–34.0)
MCHC: 29.8 g/dL — AB (ref 30.0–36.0)
MCV: 71.5 fL — AB (ref 78.0–100.0)
MONO ABS: 0.8 10*3/uL (ref 0.1–1.0)
MONOS PCT: 6 %
Neutro Abs: 6.8 10*3/uL (ref 1.7–7.7)
Neutrophils Relative %: 57 %
PLATELETS: 206 10*3/uL (ref 150–400)
RBC: 4.78 MIL/uL (ref 4.22–5.81)
RDW: 17.2 % — AB (ref 11.5–15.5)
WBC: 12.1 10*3/uL — ABNORMAL HIGH (ref 4.0–10.5)

## 2017-08-14 LAB — SAMPLE TO BLOOD BANK

## 2017-08-14 LAB — I-STAT CHEM 8, ED
BUN: 15 mg/dL (ref 6–20)
CALCIUM ION: 1.1 mmol/L — AB (ref 1.15–1.40)
Chloride: 107 mmol/L (ref 101–111)
Creatinine, Ser: 1.1 mg/dL (ref 0.61–1.24)
GLUCOSE: 151 mg/dL — AB (ref 65–99)
HCT: 32 % — ABNORMAL LOW (ref 39.0–52.0)
HEMOGLOBIN: 10.9 g/dL — AB (ref 13.0–17.0)
Potassium: 3.6 mmol/L (ref 3.5–5.1)
SODIUM: 143 mmol/L (ref 135–145)
TCO2: 24 mmol/L (ref 22–32)

## 2017-08-14 SURGERY — EXTERNAL FIXATION, LOWER EXTREMITY
Anesthesia: General | Site: Ankle | Laterality: Right

## 2017-08-14 MED ORDER — PHENYLEPHRINE 40 MCG/ML (10ML) SYRINGE FOR IV PUSH (FOR BLOOD PRESSURE SUPPORT)
PREFILLED_SYRINGE | INTRAVENOUS | Status: AC
  Filled 2017-08-14: qty 10

## 2017-08-14 MED ORDER — SUCCINYLCHOLINE CHLORIDE 200 MG/10ML IV SOSY
PREFILLED_SYRINGE | INTRAVENOUS | Status: AC
Start: 2017-08-14 — End: ?
  Filled 2017-08-14: qty 10

## 2017-08-14 MED ORDER — ROCURONIUM BROMIDE 50 MG/5ML IV SOLN
INTRAVENOUS | Status: AC
  Filled 2017-08-14: qty 1

## 2017-08-14 MED ORDER — PHENYLEPHRINE HCL 10 MG/ML IJ SOLN
INTRAMUSCULAR | Status: DC | PRN
  Administered 2017-08-14: 120 ug via INTRAVENOUS
  Administered 2017-08-14 (×2): 80 ug via INTRAVENOUS
  Administered 2017-08-14: 120 ug via INTRAVENOUS

## 2017-08-14 MED ORDER — LACTATED RINGERS IV SOLN
INTRAVENOUS | Status: DC | PRN
  Administered 2017-08-14 (×2): via INTRAVENOUS

## 2017-08-14 MED ORDER — SODIUM CHLORIDE 0.9 % IR SOLN
Status: DC | PRN
  Administered 2017-08-14 (×2): 3000 mL

## 2017-08-14 MED ORDER — LIDOCAINE 2% (20 MG/ML) 5 ML SYRINGE
INTRAMUSCULAR | Status: DC | PRN
  Administered 2017-08-14: 60 mg via INTRAVENOUS

## 2017-08-14 MED ORDER — DEXAMETHASONE SODIUM PHOSPHATE 10 MG/ML IJ SOLN
INTRAMUSCULAR | Status: AC
  Filled 2017-08-14: qty 1

## 2017-08-14 MED ORDER — LIDOCAINE 2% (20 MG/ML) 5 ML SYRINGE
INTRAMUSCULAR | Status: AC
Start: 2017-08-14 — End: ?
  Filled 2017-08-14: qty 5

## 2017-08-14 MED ORDER — FENTANYL CITRATE (PF) 100 MCG/2ML IJ SOLN
50.0000 ug | Freq: Once | INTRAMUSCULAR | Status: AC
  Administered 2017-08-14: 50 ug via INTRAVENOUS
  Filled 2017-08-14: qty 2

## 2017-08-14 MED ORDER — 0.9 % SODIUM CHLORIDE (POUR BTL) OPTIME
TOPICAL | Status: DC | PRN
  Administered 2017-08-14: 1000 mL

## 2017-08-14 MED ORDER — DEXAMETHASONE SODIUM PHOSPHATE 10 MG/ML IJ SOLN
INTRAMUSCULAR | Status: DC | PRN
  Administered 2017-08-14: 10 mg via INTRAVENOUS

## 2017-08-14 MED ORDER — ONDANSETRON HCL 4 MG/2ML IJ SOLN
INTRAMUSCULAR | Status: AC
  Filled 2017-08-14: qty 2

## 2017-08-14 MED ORDER — SUGAMMADEX SODIUM 200 MG/2ML IV SOLN
INTRAVENOUS | Status: DC | PRN
  Administered 2017-08-14: 200 mg via INTRAVENOUS

## 2017-08-14 MED ORDER — SUCCINYLCHOLINE CHLORIDE 200 MG/10ML IV SOSY
PREFILLED_SYRINGE | INTRAVENOUS | Status: DC | PRN
  Administered 2017-08-14: 100 mg via INTRAVENOUS

## 2017-08-14 MED ORDER — PROPOFOL 10 MG/ML IV BOLUS
INTRAVENOUS | Status: DC | PRN
  Administered 2017-08-14: 100 mg via INTRAVENOUS

## 2017-08-14 MED ORDER — EPHEDRINE SULFATE 50 MG/ML IJ SOLN
INTRAMUSCULAR | Status: DC | PRN
  Administered 2017-08-14: 5 mg via INTRAVENOUS

## 2017-08-14 MED ORDER — PHENYLEPHRINE HCL 10 MG/ML IJ SOLN
INTRAVENOUS | Status: DC | PRN
  Administered 2017-08-14: 40 ug/min via INTRAVENOUS

## 2017-08-14 MED ORDER — CEFAZOLIN SODIUM-DEXTROSE 2-3 GM-%(50ML) IV SOLR
INTRAVENOUS | Status: DC | PRN
  Administered 2017-08-14: 2 g via INTRAVENOUS

## 2017-08-14 MED ORDER — FENTANYL CITRATE (PF) 100 MCG/2ML IJ SOLN
25.0000 ug | INTRAMUSCULAR | Status: DC | PRN
  Administered 2017-08-15 (×2): 50 ug via INTRAVENOUS

## 2017-08-14 MED ORDER — FENTANYL CITRATE (PF) 250 MCG/5ML IJ SOLN
INTRAMUSCULAR | Status: DC | PRN
  Administered 2017-08-14 (×2): 50 ug via INTRAVENOUS

## 2017-08-14 MED ORDER — PROPOFOL 10 MG/ML IV BOLUS
INTRAVENOUS | Status: AC
  Filled 2017-08-14: qty 20

## 2017-08-14 MED ORDER — FENTANYL CITRATE (PF) 100 MCG/2ML IJ SOLN: 25.0000 ug | INTRAMUSCULAR | Status: DC | PRN

## 2017-08-14 MED ORDER — ROCURONIUM BROMIDE 10 MG/ML (PF) SYRINGE
PREFILLED_SYRINGE | INTRAVENOUS | Status: DC | PRN
  Administered 2017-08-14: 10 mg via INTRAVENOUS
  Administered 2017-08-14: 30 mg via INTRAVENOUS

## 2017-08-14 MED ORDER — EPHEDRINE SULFATE 50 MG/ML IJ SOLN
INTRAMUSCULAR | Status: AC
  Filled 2017-08-14: qty 1

## 2017-08-14 MED ORDER — CEFAZOLIN SODIUM-DEXTROSE 1-4 GM/50ML-% IV SOLN
1.0000 g | Freq: Once | INTRAVENOUS | Status: AC
  Administered 2017-08-14: 1 g via INTRAVENOUS
  Filled 2017-08-14: qty 50

## 2017-08-14 MED ORDER — ONDANSETRON HCL 4 MG/2ML IJ SOLN
INTRAMUSCULAR | Status: DC | PRN
  Administered 2017-08-14: 4 mg via INTRAVENOUS

## 2017-08-14 MED ORDER — SUGAMMADEX SODIUM 200 MG/2ML IV SOLN
INTRAVENOUS | Status: AC
  Filled 2017-08-14: qty 2

## 2017-08-14 MED ORDER — FENTANYL CITRATE (PF) 250 MCG/5ML IJ SOLN
INTRAMUSCULAR | Status: AC
  Filled 2017-08-14: qty 5

## 2017-08-14 MED ORDER — ALBUMIN HUMAN 5 % IV SOLN
INTRAVENOUS | Status: DC | PRN
  Administered 2017-08-14 (×2): via INTRAVENOUS

## 2017-08-14 MED ORDER — SODIUM CHLORIDE 0.9 % IJ SOLN
INTRAMUSCULAR | Status: AC
  Filled 2017-08-14: qty 10

## 2017-08-14 SURGICAL SUPPLY — 78 items
BANDAGE ACE 4X5 VEL STRL LF (GAUZE/BANDAGES/DRESSINGS) ×9 IMPLANT
BANDAGE ACE 6X5 VEL STRL LF (GAUZE/BANDAGES/DRESSINGS) IMPLANT
BANDAGE ESMARK 6X9 LF (GAUZE/BANDAGES/DRESSINGS) ×1 IMPLANT
BAR GLASS FIBER EXFX 11X350 (EXFIX) ×6 IMPLANT
BIT DRILL CANN MED FLUTE 4.0 (BIT) ×1 IMPLANT
BLADE SURG 10 STRL SS (BLADE) ×3 IMPLANT
BNDG COHESIVE 4X5 TAN STRL (GAUZE/BANDAGES/DRESSINGS) ×3 IMPLANT
BNDG COHESIVE 6X5 TAN STRL LF (GAUZE/BANDAGES/DRESSINGS) IMPLANT
BNDG CONFORM 3 STRL LF (GAUZE/BANDAGES/DRESSINGS) IMPLANT
BNDG ESMARK 6X9 LF (GAUZE/BANDAGES/DRESSINGS) ×3
BNDG GAUZE ELAST 4 BULKY (GAUZE/BANDAGES/DRESSINGS) ×6 IMPLANT
CANISTER SUCT 3000ML PPV (MISCELLANEOUS) IMPLANT
CHLORAPREP W/TINT 26ML (MISCELLANEOUS) IMPLANT
CLAMP BLUE BAR TO PIN (EXFIX) ×6 IMPLANT
COVER SURGICAL LIGHT HANDLE (MISCELLANEOUS) ×6 IMPLANT
CUFF TOURNIQUET SINGLE 34IN LL (TOURNIQUET CUFF) ×3 IMPLANT
CUFF TOURNIQUET SINGLE 44IN (TOURNIQUET CUFF) IMPLANT
DRAIN CHANNEL 10F 3/8 F FF (DRAIN) ×3 IMPLANT
DRAPE C-ARM 42X72 X-RAY (DRAPES) ×3 IMPLANT
DRAPE C-ARMOR (DRAPES) ×3 IMPLANT
DRAPE U-SHAPE 47X51 STRL (DRAPES) ×3 IMPLANT
DRILL CANN 4.0MM (BIT) ×3
DRSG ADAPTIC 3X8 NADH LF (GAUZE/BANDAGES/DRESSINGS) IMPLANT
DRSG MEPITEL 4X7.2 (GAUZE/BANDAGES/DRESSINGS) ×3 IMPLANT
DRSG PAD ABDOMINAL 8X10 ST (GAUZE/BANDAGES/DRESSINGS) ×6 IMPLANT
DRSG VAC ATS SM SENSATRAC (GAUZE/BANDAGES/DRESSINGS) ×3 IMPLANT
ELECT REM PT RETURN 9FT ADLT (ELECTROSURGICAL) ×3
ELECTRODE REM PT RTRN 9FT ADLT (ELECTROSURGICAL) ×1 IMPLANT
EVACUATOR SILICONE 100CC (DRAIN) ×3 IMPLANT
GAUZE SPONGE 4X4 12PLY STRL (GAUZE/BANDAGES/DRESSINGS) ×6 IMPLANT
GLOVE BIO SURGEON STRL SZ8 (GLOVE) ×3 IMPLANT
GLOVE BIOGEL PI IND STRL 8 (GLOVE) ×1 IMPLANT
GLOVE BIOGEL PI INDICATOR 8 (GLOVE) ×2
GLOVE ECLIPSE 8.0 STRL XLNG CF (GLOVE) ×3 IMPLANT
GOWN STRL REUS W/ TWL LRG LVL3 (GOWN DISPOSABLE) ×1 IMPLANT
GOWN STRL REUS W/ TWL XL LVL3 (GOWN DISPOSABLE) ×1 IMPLANT
GOWN STRL REUS W/TWL LRG LVL3 (GOWN DISPOSABLE) ×2
GOWN STRL REUS W/TWL XL LVL3 (GOWN DISPOSABLE) ×2
KIT BASIN OR (CUSTOM PROCEDURE TRAY) ×3 IMPLANT
KIT TURNOVER KIT B (KITS) ×3 IMPLANT
NEEDLE 22X1 1/2 (OR ONLY) (NEEDLE) IMPLANT
NS IRRIG 1000ML POUR BTL (IV SOLUTION) ×3 IMPLANT
PACK ORTHO EXTREMITY (CUSTOM PROCEDURE TRAY) ×3 IMPLANT
PAD ABD 8X10 STRL (GAUZE/BANDAGES/DRESSINGS) ×6 IMPLANT
PAD ARMBOARD 7.5X6 YLW CONV (MISCELLANEOUS) ×6 IMPLANT
PAD CAST 4YDX4 CTTN HI CHSV (CAST SUPPLIES) ×3 IMPLANT
PADDING CAST COTTON 4X4 STRL (CAST SUPPLIES) ×6
PADDING CAST COTTON 6X4 STRL (CAST SUPPLIES) IMPLANT
PIN CLAMP 2BAR 75MM BLUE (EXFIX) ×3 IMPLANT
PIN HALF YELLOW 5X160X35 (EXFIX) ×6 IMPLANT
PIN TRANSFIXING 5.0 (EXFIX) ×3 IMPLANT
SCRUB BETADINE 4OZ XXX (MISCELLANEOUS) ×3 IMPLANT
SET CYSTO W/LG BORE CLAMP LF (SET/KITS/TRAYS/PACK) ×3 IMPLANT
SOAP 2 % CHG 4 OZ (WOUND CARE) ×3 IMPLANT
SOLUTION BETADINE 4OZ (MISCELLANEOUS) ×3 IMPLANT
SPLINT PLASTER CAST XFAST 5X30 (CAST SUPPLIES) ×1 IMPLANT
SPLINT PLASTER XFAST SET 5X30 (CAST SUPPLIES) ×2
SPONGE LAP 4X18 RFD (DISPOSABLE) ×3 IMPLANT
STAPLER VISISTAT 35W (STAPLE) ×3 IMPLANT
SUCTION FRAZIER HANDLE 10FR (MISCELLANEOUS)
SUCTION TUBE FRAZIER 10FR DISP (MISCELLANEOUS) IMPLANT
SUT ETHILON 2 0 FS 18 (SUTURE) ×9 IMPLANT
SUT ETHILON 3 0 PS 1 (SUTURE) IMPLANT
SUT PDS AB 0 CT 36 (SUTURE) ×3 IMPLANT
SUT PROLENE 0 CT (SUTURE) ×3 IMPLANT
SUT PROLENE 3 0 PS 2 (SUTURE) ×3 IMPLANT
SUT SILK 2 0 TIES 10X30 (SUTURE) ×3 IMPLANT
SUT VIC AB 2-0 CT1 27 (SUTURE) ×4
SUT VIC AB 2-0 CT1 TAPERPNT 27 (SUTURE) ×2 IMPLANT
SUT VIC AB 3-0 PS2 18 (SUTURE) ×2
SUT VIC AB 3-0 PS2 18XBRD (SUTURE) ×1 IMPLANT
SYR CONTROL 10ML LL (SYRINGE) IMPLANT
TOWEL OR 17X24 6PK STRL BLUE (TOWEL DISPOSABLE) ×3 IMPLANT
TOWEL OR 17X26 10 PK STRL BLUE (TOWEL DISPOSABLE) ×6 IMPLANT
TUBE CONNECTING 12'X1/4 (SUCTIONS) ×1
TUBE CONNECTING 12X1/4 (SUCTIONS) ×2 IMPLANT
WATER STERILE IRR 1000ML POUR (IV SOLUTION) IMPLANT
YANKAUER SUCT BULB TIP NO VENT (SUCTIONS) ×3 IMPLANT

## 2017-08-14 NOTE — ED Provider Notes (Signed)
[name] Provider Note   CSN: [id number] Arrival date & time: 08/14/17  1836     History   Chief Complaint Chief Complaint  Patient presents with  . Extremity Laceration    Cut by Lawnmower    HPI Charles Patrick is a 84 y.o. male. Level 5 caveat due to altered mental status. HPI Patient brought in after getting his right foot and lower leg caught between his mower and a wall.  Twisted and caused injury to his posterior lower leg with large wound.  Brought in by EMS.  EMS stated he had no medical problems and took no medicines, however does have a rather extensive medical history including aortic valve replacement.  Received fentanyl by EMS and is mildly confused at this time. Past Medical History:  Diagnosis Date  . Anemia   . Aortic valve stenosis   . Arthropathy   . BPH associated with nocturia   . Carotid artery disease (HCC)   . Colon polyp   . Dilated aortic root (HCC)   . Elevated PSA   . Erectile dysfunction   . Gastric paresis   . Hypercholesterolemia   . Hyperglycemia   . Hypertension    Patient denies  . Hypothyroid   . Iron deficiency   . Pulmonary hypertension (HCC)   . 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 Seizure disorder (HCC)   . Thrombocytopenia (HCC)   . Thyroid nodule   . Tricuspid regurgitation   . Vitamin D deficiency     Patient Active Problem List   Diagnosis Date Noted  . Anemia   . 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 HTN (hypertension) 10/08/2016  . HLD (hyperlipidemia) 10/08/2016  . Hypothyroidism 10/08/2016  . Pulmonary hypertension (HCC) 10/08/2016  . Tricuspid regurgitation 10/08/2016  . Severe aortic stenosis     Past Surgical History:  Procedure Laterality Date  . MULTIPLE EXTRACTIONS WITH ALVEOLOPLASTY N/A 10/17/2016   Procedure: Extraction of tooth #'s 4, 21,22,27,28,29 with alveoloplasty;  Surgeon: [name], [name], DDS;  [facility];  Service: Oral Surgery;  Laterality: N/A;  . NO PAST SURGERIES    . RIGHT/LEFT HEART CATH AND CORONARY ANGIOGRAPHY N/A 01/11/2017   Procedure: RIGHT/LEFT HEART CATH AND CORONARY ANGIOGRAPHY;  Surgeon: [name], [name], MD;  [facility];  Service: Cardiovascular;  Laterality: N/A;  . TEE WITHOUT CARDIOVERSION N/A 01/22/2017   Procedure: TRANSESOPHAGEAL ECHOCARDIOGRAM (TEE);  Surgeon: [name], [name], MD;  [facility];  Service: Open Heart Surgery;  Laterality: N/A;  . TRANSCATHETER AORTIC VALVE REPLACEMENT, TRANSFEMORAL N/A 01/22/2017   Procedure: TRANSCATHETER AORTIC VALVE REPLACEMENT, TRANSFEMORAL using Edwards 29 Sapien 3 Aortic Valve;  Surgeon: [name], [name], MD;  [facility];  Service: Open Heart Surgery;  Laterality: N/A;        Home Medications    Prior to Admission medications   Medication Sig Start Date End Date Taking? Authorizing Provider  aspirin EC 81 MG tablet Take 81 mg by mouth daily.    Yes [provider]  clopidogrel (PLAVIX) 75 MG tablet Take 1 tablet (75 mg total) by mouth daily with breakfast. 01/24/17  Yes Janetta Horahompson, Kathryn R, PA-C  levothyroxine (SYNTHROID, LEVOTHROID) 50 MCG tablet Take 50 mcg by mouth daily before breakfast.  01/27/14  Yes [provider]  Multiple Vitamin (MULTIVITAMIN) capsule Take 1 capsule by mouth daily.  Yes [provider]  simvastatin (ZOCOR) 40 MG tablet Take 40 mg by mouth daily at 6 PM.    Yes [provider]  tamsulosin (FLOMAX) 0.4 MG CAPS capsule Take 0.4 mg by mouth daily after supper.  05/25/17  Yes [provider]  amoxicillin (AMOXIL) 500 MG tablet Take 4 tablets by mouth one hour prior to dental appointments and stomach or bladder procedures 01/30/17   Janetta Hora, PA-C    Family History Family History  Problem Relation Age of  Onset  . Healthy Mother   . Healthy Father     Social History Social History   Tobacco Use  . Smoking status: Never Smoker  . Smokeless tobacco: Never Used  Substance Use Topics  . Alcohol use: No  . Drug use: No     Allergies   Patient has no known allergies.   Review of Systems Review of Systems  Unable to perform ROS: Mental status change  Cardiovascular: Negative for chest pain.  Gastrointestinal: Negative for abdominal pain.  Musculoskeletal:       Right lower leg injury  Skin: Positive for wound.     Physical Exam Updated Vital Signs BP (!) 108/97   Pulse 78   Temp 97.8 F (36.6 C) (Oral)   Resp (!) 24   Ht 6\' 2"  (1.88 m)   Wt 77.1 kg (170 lb)   SpO2 100%   BMI 21.83 kg/m   Physical Exam  Constitutional: He appears well-developed and well-nourished.  HENT:  Head: Atraumatic.  Eyes: EOM are normal.  Neck: Neck supple.  Cardiovascular: Normal rate.  Pulmonary/Chest: Effort normal.  Abdominal: There is no tenderness.  Musculoskeletal: He exhibits tenderness.  Large soft tissue injury to posterior right lower leg distally.  Cannot palpate Achilles.  Patient cannot plantarflex foot.  Does have sensation grossly in foot and has dorsalis pedis pulse.  Crepitance on lower leg and unstable lower leg.  No tenderness over knee.  No tenderness over other extremities.  Neurological: He is alert.  Awake and mildly confused after fentanyl.  Skin: Skin is warm. Capillary refill takes less than 2 seconds.       ED Treatments / Results  Labs (all labs ordered are listed, but only abnormal results are displayed) Labs Reviewed  CBC WITH DIFFERENTIAL/PLATELET - Abnormal; Notable for the following components:      Result Value   WBC 12.1 (*)    Hemoglobin 10.2 (*)    HCT 34.2 (*)    MCV 71.5 (*)    MCH 21.3 (*)    MCHC 29.8 (*)    RDW 17.2 (*)    Lymphs Abs 4.1 (*)    All other components within normal limits  I-STAT CHEM 8, ED - Abnormal; Notable for  the following components:   Glucose, Bld 151 (*)    Calcium, Ion 1.10 (*)    Hemoglobin 10.9 (*)    HCT 32.0 (*)    All other components within normal limits  SAMPLE TO BLOOD BANK    EKG None  Radiology Dg Chest Portable 1 View  Result Date: 08/14/2017 CLINICAL DATA:  Struck by lawnmower. EXAM: PORTABLE CHEST 1 VIEW COMPARISON:  Chest radiograph January 22, 2017 FINDINGS: Cardiac silhouette is mildly enlarged, unchanged. Calcified aortic arch, potential ectasia. Status post cardiac valve replacement. Mild bibasilar strandy densities without pleural effusion or focal consolidation. No pneumothorax. Soft tissue planes and included osseous structures are non sub suspicious.Old LEFT rib fracture. IMPRESSION: Bibasilar atelectasis/scarring. Stable  cardiomegaly. Aortic Atherosclerosis (ICD10-I70.0). Electronically Signed   By: Awilda Metro M.D.   On: 08/14/2017 19:15   Dg Tibia/fibula Right Port  Result Date: 08/14/2017 CLINICAL DATA:  84 year old male status post trauma from spinning lung lower blades. EXAM: PORTABLE RIGHT TIBIA AND FIBULA - 2 VIEW COMPARISON:  None. FINDINGS: AP and cross-table lateral views. Severe oblique penetrating trauma through the right tibia and fibula meta diaphyses. Severely comminuted fractures with numerous small bone fragments scattered in the adjacent soft tissues. Severe associated soft tissue injury. Oblique superior to inferior and anterior to posterior trajectory. Mild lateral displacement and medial angulation of the distal fragments. Fracture of the tibia extends toward distal tibia fibular syndesmosis, but appears to stop short of the mortise joint. Right mortise joint remains normally aligned. Talar dome, and malleoli intact. Calcaneus intact. Superimposed calcified peripheral vascular disease. The mid and proximal right tibia and fibula are intact. Maintained normal alignment at the right knee. Visible bones of the right foot appear intact. IMPRESSION: 1.  Severe penetrating trauma through the right tibia and fibula at an oblique angle with severely comminuted fractures of both bones and numerous small bone fragments. 2. Distal tibia fracture likely extends into the distal tibia-fibular syndesmosis, but appears to spare the mortise joint. Electronically Signed   By: Odessa Fleming M.D.   On: 08/14/2017 19:21    Procedures Procedures (including critical care time)  Medications Ordered in ED Medications  ceFAZolin (ANCEF) IVPB 1 g/50 mL premix (1 g Intravenous New Bag/Given 08/14/17 1919)     Initial Impression / Assessment and Plan / ED Course  I have reviewed the triage vital signs and the nursing notes.  Pertinent labs & imaging results that were available during my care of the patient were reviewed by me and considered in my medical decision making (see chart for details).      patient with soft tissue and bony injury of right lower leg.  Seen in the ER by Dr. Justice Rocher g of Ancef given by EMS and another gram given here.  Pain improved.  Has been immobilized.will take the OR tonight.  Internal medicine will consult.  Final Clinical Impressions(s) / ED Diagnoses   Final diagnoses:  Injury  Type III open fracture of right tibia and fibula, initial encounter    ED Discharge Orders    None       Benjiman Core, MD 08/14/17 2028

## 2017-08-14 NOTE — ED Notes (Signed)
Ortho paged. 

## 2017-08-14 NOTE — Consult Note (Signed)
Reason for Consult: Right ankle injury Referring Physician: Dr. Daisy Lazar is an 84 y.o. male.  HPI: The patient is a 84 year old male who was mowing grass with a 0 turn lawnmower on a slope this afternoon.  He lost traction and slid down the hill.  The mower hit a brick wall pinning the patient's right leg between the mower deck and the wall.  He was brought to the emergency room via EMS.  He was found to have open tibia and fibula fractures.  He denies any history of injury or surgery to that leg.  He and his wife live independently.  He is not a smoker.  He is not diabetic.  Past Medical History:  Diagnosis Date  . Anemia   . Aortic valve stenosis   . Arthropathy   . BPH associated with nocturia   . Carotid artery disease (HCC)   . Colon polyp   . Dilated aortic root (HCC)   . Elevated PSA   . Erectile dysfunction   . Gastric paresis   . Hypercholesterolemia   . Hyperglycemia   . Hypertension    Patient denies  . Hypothyroid   . Iron deficiency   . Pulmonary hypertension (HCC)   . 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 Seizure disorder (HCC)   . Thrombocytopenia (HCC)   . Thyroid nodule   . Tricuspid regurgitation   . Vitamin D deficiency     Past Surgical History:  Procedure Laterality Date  . MULTIPLE EXTRACTIONS WITH ALVEOLOPLASTY N/A 10/17/2016   Procedure: Extraction of tooth #'s 4, 21,22,27,28,29 with alveoloplasty;  Surgeon: Charlynne Pander, DDS;  Location: [facility] OR;  Service: Oral Surgery;  Laterality: N/A;  . NO PAST SURGERIES    . RIGHT/LEFT HEART CATH AND CORONARY ANGIOGRAPHY N/A 01/11/2017   Procedure: RIGHT/LEFT HEART CATH AND CORONARY ANGIOGRAPHY;  Surgeon: Tonny Bollman, MD;  Location: [facility] INVASIVE CV LAB;  Service: Cardiovascular;  Laterality: N/A;  . TEE WITHOUT CARDIOVERSION N/A 01/22/2017   Procedure:  TRANSESOPHAGEAL ECHOCARDIOGRAM (TEE);  Surgeon: Tonny Bollman, MD;  Location[facility] OR;  Service: Open Heart Surgery;  Laterality: N/A;  . TRANSCATHETER AORTIC VALVE REPLACEMENT, TRANSFEMORAL N/A 01/22/2017   Procedure: TRANSCATHETER AORTIC VALVE REPLACEMENT, TRANSFEMORAL using Edwards 29 Sapien 3 Aortic Valve;  Surgeon: Tonny Bollman, MD;  Location: [facility] OR;  Service: Open Heart Surgery;  Laterality: N/A;    Family History  Problem Relation Age of Onset  . Healthy Mother   . Healthy Father     Social History:  reports that he has never smoked. He has never used smokeless tobacco. He reports that he does not drink alcohol or use drugs.  Allergies: No Known Allergies  Medications: I have reviewed the patient's current medications.  Results for orders placed or performed during the hospital encounter of 08/14/17 (from the past 48 hour(s))  CBC with Differential     Status: Abnormal   Collection Time: 08/14/17  6:43 PM  Result Value Ref Range   WBC 12.1 (H) 4.0 - 10.5 K/uL   RBC 4.78 4.22 - 5.81 MIL/uL   Hemoglobin 10.2 (L) 13.0 - 17.0 g/dL   HCT 78.2 (L) 95.6 - 21.3 %   MCV 71.5 (L) 78.0 - 100.0 fL   MCH 21.3 (L) 26.0 - 34.0 pg   MCHC 29.8 (L) 30.0 - 36.0 g/dL   RDW 08.6 (H) 57.8 -  15.5 %   Platelets 206 150 - 400 K/uL   Neutrophils Relative % 57 %   Neutro Abs 6.8 1.7 - 7.7 K/uL   Lymphocytes Relative 34 %   Lymphs Abs 4.1 (H) 0.7 - 4.0 K/uL   Monocytes Relative 6 %   Monocytes Absolute 0.8 0.1 - 1.0 K/uL   Eosinophils Relative 2 %   Eosinophils Absolute 0.3 0.0 - 0.7 K/uL   Basophils Relative 1 %   Basophils Absolute 0.1 0.0 - 0.1 K/uL   Immature Granulocytes 0 %   Abs Immature Granulocytes 0.0 0.0 - 0.1 K/uL    Comment: Performed at [facility], [address] [place], [place] [address]  Sample to Blood Bank     Status: None   Collection Time: 08/14/17  6:44 PM  Result Value Ref Range   Blood Bank Specimen SAMPLE AVAILABLE FOR TESTING    Sample Expiration       08/15/2017 Performed at [facility], [address] [place], [place] [address]   I-stat Chem 8, ED     Status: Abnormal   Collection Time: 08/14/17  7:00 PM  Result Value Ref Range   Sodium 143 135 - 145 mmol/L   Potassium 3.6 3.5 - 5.1 mmol/L   Chloride 107 101 - 111 mmol/L   BUN 15 6 - 20 mg/dL   Creatinine, Ser 9.621.10 0.61 - 1.24 mg/dL   Glucose, Bld 952151 (H) 65 - 99 mg/dL   Calcium, Ion 8.411.10 (L) 1.15 - 1.40 mmol/L   TCO2 24 22 - 32 mmol/L   Hemoglobin 10.9 (L) 13.0 - 17.0 g/dL   HCT 32.432.0 (L) 40.139.0 - 02.752.0 %    Dg Chest Portable 1 View  Result Date: 08/14/2017 CLINICAL DATA:  Struck by lawnmower. EXAM: PORTABLE CHEST 1 VIEW COMPARISON:  Chest radiograph January 22, 2017 FINDINGS: Cardiac silhouette is mildly enlarged, unchanged. Calcified aortic arch, potential ectasia. Status post cardiac valve replacement. Mild bibasilar strandy densities without pleural effusion or focal consolidation. No pneumothorax. Soft tissue planes and included osseous structures are non sub suspicious.Old LEFT rib fracture. IMPRESSION: Bibasilar atelectasis/scarring. Stable cardiomegaly. Aortic Atherosclerosis (ICD10-I70.0). Electronically Signed   By: Awilda Metroourtnay  Bloomer M.D.   On: 08/14/2017 19:15   Dg Tibia/fibula Right Port  Result Date: 08/14/2017 CLINICAL DATA:  84 year old male status post trauma from spinning lung lower blades. EXAM: PORTABLE RIGHT TIBIA AND FIBULA - 2 VIEW COMPARISON:  None. FINDINGS: AP and cross-table lateral views. Severe oblique penetrating trauma through the right tibia and fibula meta diaphyses. Severely comminuted fractures with numerous small bone fragments scattered in the adjacent soft tissues. Severe associated soft tissue injury. Oblique superior to inferior and anterior to posterior trajectory. Mild lateral displacement and medial angulation of the distal fragments. Fracture of the tibia extends toward distal tibia fibular syndesmosis, but appears to stop short of the  mortise joint. Right mortise joint remains normally aligned. Talar dome, and malleoli intact. Calcaneus intact. Superimposed calcified peripheral vascular disease. The mid and proximal right tibia and fibula are intact. Maintained normal alignment at the right knee. Visible bones of the right foot appear intact. IMPRESSION: 1. Severe penetrating trauma through the right tibia and fibula at an oblique angle with severely comminuted fractures of both bones and numerous small bone fragments. 2. Distal tibia fracture likely extends into the distal tibia-fibular syndesmosis, but appears to spare the mortise joint. Electronically Signed   By: Odessa FlemingH  Hall M.D.   On: 08/14/2017 19:21  ROS: No recent fever, chills, nausea, vomiting or changes in his appetite. PE:  Blood pressure 104/86, pulse 79, temperature 97.8 F (36.6 C), temperature source Oral, resp. rate 17, height 6\' 2"  (1.88 m), weight 77.1 kg (170 lb), SpO2 95 %. Well-nourished well-developed man appearing younger than his stated age.  Alert and oriented x4.  Mood and affect are normal.  Extraocular motions are intact on the left.  Respirations are unlabored.  Gait is nonweightbearing on the right lower extremity.  There is a large complex posterior ankle laceration with what appears to be lacerated Achilles tendon.  The posterior tibial tendon is visible as well.  There is no gross contamination.  2+ dorsalis pedis pulse.  No palpable posterior tibial pulse.  Brisk capillary refill at the toes.  Active plantar flexion and dorsiflexion strength at the toes.  No lymphadenopathy.  Sensibility to light touch is intact in the medial and lateral plantar nerve distribution, superficial and deep peroneal nerve distribution.  Sural and saphenous nerve distribution.  Assessment/Plan: Open grade 3 right tibia and fibular fractures with complex posterior ankle laceration and laceration of the Achilles tendon. -To the operating room tonight for irrigation and  excisional debridement, application of external fixator and closure versus wound VAC placement.  The risks and benefits of the alternative treatment options have been discussed in detail.  The patient wishes to proceed with surgery and specifically understands risks of bleeding, infection, nerve damage, blood clots, need for additional surgery, amputation and death.   Toni Arthurs 08/18/17, 10:11 PM

## 2017-08-14 NOTE — Consult Note (Signed)
Reason for Consult: Medical management of medical issues. Referring Physician: Dr. [name].  Orthopedic surgeon.  Charles Patrick is an 84 y.o. male.  HPI: 84 year old male with history of severe aortic stenosis status post TAVR last November, hyperlipidemia, BPH, chronic anemia, hypothyroidism had injured as right lower extremity with lawnmower this evening.  Patient has significant bleed and x-rays done in the ER revealed fracture of the tibia.  Patient states he did not hit his head or lose consciousness denies any chest pain shortness of breath nausea vomiting abdominal pain or diarrhea.  Patient's labs were reviewed along with medications.  Past Medical History:  Diagnosis Date  . Anemia   . Aortic valve stenosis   . Arthropathy   . BPH associated with nocturia   . Carotid artery disease (HCC)   . Colon polyp   . Dilated aortic root (HCC)   . Elevated PSA   . Erectile dysfunction   . Gastric paresis   . Hypercholesterolemia   . Hyperglycemia   . Hypertension    Patient denies  . Hypothyroid   . Iron deficiency   . Pulmonary hypertension (HCC)   . 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 Seizure disorder (HCC)   . Thrombocytopenia (HCC)   . Thyroid nodule   . Tricuspid regurgitation   . Vitamin D deficiency     Past Surgical History:  Procedure Laterality Date  . MULTIPLE EXTRACTIONS WITH ALVEOLOPLASTY N/A 10/17/2016   Procedure: Extraction of tooth #'s 4, 21,22,27,28,29 with alveoloplasty;  Surgeon: Charlynne Pander, DDS;  Location: [facility] OR;  Service: Oral Surgery;  Laterality: N/A;  . NO PAST SURGERIES    . RIGHT/LEFT HEART CATH AND CORONARY ANGIOGRAPHY N/A 01/11/2017   Procedure: RIGHT/LEFT HEART CATH AND CORONARY ANGIOGRAPHY;  Surgeon: Tonny Bollman, MD;  Location: [facility] INVASIVE CV LAB;  Service: Cardiovascular;  Laterality: N/A;  . TEE WITHOUT  CARDIOVERSION N/A 01/22/2017   Procedure: TRANSESOPHAGEAL ECHOCARDIOGRAM (TEE);  Surgeon: Tonny Bollman, MD;  Location: [facility] OR;  Service: Open Heart Surgery;  Laterality: N/A;  . TRANSCATHETER AORTIC VALVE REPLACEMENT, TRANSFEMORAL N/A 01/22/2017   Procedure: TRANSCATHETER AORTIC VALVE REPLACEMENT, TRANSFEMORAL using Edwards 29 Sapien 3 Aortic Valve;  Surgeon: Tonny Bollman, MD;  Location: [facility] OR;  Service: Open Heart Surgery;  Laterality: N/A;    Family History  Problem Relation Age of Onset  . Healthy Mother   . Healthy Father     Social History:  reports that he has never smoked. He has never used smokeless tobacco. He reports that he does not drink alcohol or use drugs.  Allergies: No Known Allergies  Medications: I have reviewed the patient's current medications.  Results for orders placed or performed during the hospital encounter of 08/14/17 (from the past 48 hour(s))  CBC with Differential     Status: Abnormal   Collection Time: 08/14/17  6:43 PM  Result Value Ref Range   WBC 12.1 (H) 4.0 - 10.5 K/uL   RBC 4.78 4.22 - 5.81 MIL/uL   Hemoglobin 10.2 (L) 13.0 - 17.0 g/dL   HCT 82.9 (L) 56.2 - 13.0 %   MCV 71.5 (L) 78.0 - 100.0 fL   MCH 21.3 (L) 26.0 - 34.0 pg   MCHC 29.8 (L) 30.0 - 36.0 g/dL   RDW 86.5 (H) 78.4 - 69.6 %   Platelets 206 150 - 400 K/uL   Neutrophils Relative % 57 %  Neutro Abs 6.8 1.7 - 7.7 K/uL   Lymphocytes Relative 34 %   Lymphs Abs 4.1 (H) 0.7 - 4.0 K/uL   Monocytes Relative 6 %   Monocytes Absolute 0.8 0.1 - 1.0 K/uL   Eosinophils Relative 2 %   Eosinophils Absolute 0.3 0.0 - 0.7 K/uL   Basophils Relative 1 %   Basophils Absolute 0.1 0.0 - 0.1 K/uL   Immature Granulocytes 0 %   Abs Immature Granulocytes 0.0 0.0 - 0.1 K/uL    Comment: Performed at [facility], [address] [place], [place] [address]  Sample to Blood Bank     Status: None   Collection Time: 08/14/17  6:44 PM  Result Value Ref Range   Blood Bank Specimen SAMPLE  AVAILABLE FOR TESTING    Sample Expiration      08/15/2017 Performed at [facility], [address] [place], [place] [address]   I-stat Chem 8, ED     Status: Abnormal   Collection Time: 08/14/17  7:00 PM  Result Value Ref Range   Sodium 143 135 - 145 mmol/L   Potassium 3.6 3.5 - 5.1 mmol/L   Chloride 107 101 - 111 mmol/L   BUN 15 6 - 20 mg/dL   Creatinine, Ser 3.081.10 0.61 - 1.24 mg/dL   Glucose, Bld 657151 (H) 65 - 99 mg/dL   Calcium, Ion 8.461.10 (L) 1.15 - 1.40 mmol/L   TCO2 24 22 - 32 mmol/L   Hemoglobin 10.9 (L) 13.0 - 17.0 g/dL   HCT 96.232.0 (L) 95.239.0 - 84.152.0 %    Dg Chest Portable 1 View  Result Date: 08/14/2017 CLINICAL DATA:  Struck by lawnmower. EXAM: PORTABLE CHEST 1 VIEW COMPARISON:  Chest radiograph January 22, 2017 FINDINGS: Cardiac silhouette is mildly enlarged, unchanged. Calcified aortic arch, potential ectasia. Status post cardiac valve replacement. Mild bibasilar strandy densities without pleural effusion or focal consolidation. No pneumothorax. Soft tissue planes and included osseous structures are non sub suspicious.Old LEFT rib fracture. IMPRESSION: Bibasilar atelectasis/scarring. Stable cardiomegaly. Aortic Atherosclerosis (ICD10-I70.0). Electronically Signed   By: Awilda Metroourtnay  Bloomer M.D.   On: 08/14/2017 19:15   Dg Tibia/fibula Right Port  Result Date: 08/14/2017 CLINICAL DATA:  84 year old male status post trauma from spinning lung lower blades. EXAM: PORTABLE RIGHT TIBIA AND FIBULA - 2 VIEW COMPARISON:  None. FINDINGS: AP and cross-table lateral views. Severe oblique penetrating trauma through the right tibia and fibula meta diaphyses. Severely comminuted fractures with numerous small bone fragments scattered in the adjacent soft tissues. Severe associated soft tissue injury. Oblique superior to inferior and anterior to posterior trajectory. Mild lateral displacement and medial angulation of the distal fragments. Fracture of the tibia extends toward distal tibia fibular  syndesmosis, but appears to stop short of the mortise joint. Right mortise joint remains normally aligned. Talar dome, and malleoli intact. Calcaneus intact. Superimposed calcified peripheral vascular disease. The mid and proximal right tibia and fibula are intact. Maintained normal alignment at the right knee. Visible bones of the right foot appear intact. IMPRESSION: 1. Severe penetrating trauma through the right tibia and fibula at an oblique angle with severely comminuted fractures of both bones and numerous small bone fragments. 2. Distal tibia fracture likely extends into the distal tibia-fibular syndesmosis, but appears to spare the mortise joint. Electronically Signed   By: Odessa FlemingH  Hall M.D.   On: 08/14/2017 19:21    Review of Systems  Constitutional: Negative.   HENT: Negative.   Eyes: Negative.   Respiratory: Negative.  Cardiovascular: Negative.   Gastrointestinal: Negative.   Genitourinary: Negative.   Musculoskeletal:       Pain in lower extremity.  Skin: Negative.   Neurological: Negative.   Endo/Heme/Allergies: Negative.   Psychiatric/Behavioral: Negative.    Blood pressure 104/86, pulse 79, temperature 97.8 F (36.6 C), temperature source Oral, resp. rate 17, height 6\' 2"  (1.88 m), weight 77.1 kg (170 lb), SpO2 95 %. Physical Exam  Constitutional: He is oriented to person, place, and time. He appears well-developed and well-nourished. No distress.  HENT:  Head: Normocephalic and atraumatic.  Eyes: Pupils are equal, round, and reactive to light. Conjunctivae are normal. Right eye exhibits no discharge. Left eye exhibits no discharge.  Neck: Normal range of motion.  Cardiovascular: Normal rate and regular rhythm.  Respiratory: Effort normal and breath sounds normal.  GI: Soft. Bowel sounds are normal. He exhibits no distension. There is no tenderness.  Musculoskeletal:  Right lower extremity is in dressing.  Neurological: He is alert and oriented to person, place, and time.   Skin: He is not diaphoretic.  Dressing of the right lower extremity.  Psychiatric: He has a normal mood and affect. His behavior is normal.    Assessment/Plan: #1.  Right lower extremity injury with tibial and fibula fracture -patient denies any chest pain or shortness of breath and is not in any distress.  Is at moderate risk for intermediate risk procedure.  Patient kept n.p.o. and on pain medications. #2.  Status post TAVR -no evidence of any fluid overload denies any chest pain. #3.  Hypothyroidism -restart Synthroid after surgery. #4.  Chronic anemia -follow CBC.  Likely to be dropping due to blood loss. #5.  BPH -continue tamsulosin after surgery. #6.  Hyperlipidemia -resume statins after surgery.  DVT prophylaxis per orthopedic surgery.  Thanks for involving [place] in patient's care will follow along with you.  Eduard Clos 08/14/2017, 8:40 PM

## 2017-08-14 NOTE — ED Triage Notes (Signed)
Pt arrives to ED from home with complaints of cutting the back of his right ankle since cutting his grass this afternoon. Pt reports he was cutting across a hill and the lawnmower started to slip; he stuck his foot out to try and stop it and the blades cut the back of his ankle. Bleeding controlled upon arrival; pt given 100 fentanyl and 1g ancef by EMS pta; pt alert and oriented x4. Pt placed in position of comfort with bed locked and lowered, call bell in reach.

## 2017-08-14 NOTE — Anesthesia Preprocedure Evaluation (Signed)
Anesthesia Evaluation  Patient identified by MRN, date of birth, ID band Patient awake    Reviewed: Allergy & Precautions, NPO status , Patient's Chart, lab work & pertinent test results  Airway Mallampati: II  TM Distance: >3 FB Neck ROM: Full    Dental  (+) Dental Advisory Given   Pulmonary neg pulmonary ROS,    breath sounds clear to auscultation       Cardiovascular hypertension, + Peripheral Vascular Disease   Rhythm:Regular Rate:Normal  S/p TAVR valve replacement. Normal EF. Dilated aortic root at 4.8cm   Neuro/Psych Seizures -,     GI/Hepatic negative GI ROS, Neg liver ROS,   Endo/Other  Hypothyroidism   Renal/GU negative Renal ROS     Musculoskeletal   Abdominal   Peds  Hematology  (+) anemia ,   Anesthesia Other Findings   Reproductive/Obstetrics                             Anesthesia Physical Anesthesia Plan  ASA: III and emergent  Anesthesia Plan: General   Post-op Pain Management:    Induction: Intravenous  PONV Risk Score and Plan: 2 and Ondansetron, Dexamethasone and Treatment may vary due to age or medical condition  Airway Management Planned: Oral ETT  Additional Equipment:   Intra-op Plan:   Post-operative Plan: Extubation in OR and Possible Post-op intubation/ventilation  Informed Consent: I have reviewed the patients History and Physical, chart, labs and discussed the procedure including the risks, benefits and alternatives for the proposed anesthesia with the patient or authorized representative who has indicated his/her understanding and acceptance.   Dental advisory given  Plan Discussed with:   Anesthesia Plan Comments:         Anesthesia Quick Evaluation

## 2017-08-14 NOTE — ED Notes (Signed)
Ortho at bedside.

## 2017-08-14 NOTE — ED Notes (Signed)
Pt placement notified of need for bed request

## 2017-08-14 NOTE — Progress Notes (Signed)
Orthopedic Tech Progress Note Patient Details:  Charles KaufmannDavid B Patrick [birth year] [id number]  Ortho Devices Type of Ortho Device: Ace wrap, Post (short leg) splint Ortho Device/Splint Location: RLE Ortho Device/Splint Interventions: Ordered, Application   Post Interventions Patient Tolerated: Well Instructions Provided: Care of device   [name], [name] 08/14/2017, 8:57 PM

## 2017-08-14 NOTE — ED Notes (Signed)
Kakrakandy, MD at bedside 

## 2017-08-14 NOTE — Anesthesia Procedure Notes (Signed)
Arterial Line Insertion Start/End6/19/2019 9:25 PM, 08/14/2017 9:30 PM Performed by: Dairl PonderJiang, Fudan, CRNA, Rada Zegers, Howie Illarrie B, CRNA, CRNA  Preanesthetic checklist: patient identified, IV checked, risks and benefits discussed, surgical consent, monitors and equipment checked and pre-op evaluation Left, radial was placed Catheter size: 20 G Hand hygiene performed  and maximum sterile barriers used   Attempts: 1 Procedure performed without using ultrasound guided technique. Following insertion, Biopatch and dressing applied.

## 2017-08-15 ENCOUNTER — Emergency Department (HOSPITAL_COMMUNITY): Payer: Medicare HMO

## 2017-08-15 ENCOUNTER — Encounter (HOSPITAL_COMMUNITY): Payer: Self-pay | Admitting: Orthopedic Surgery

## 2017-08-15 ENCOUNTER — Inpatient Hospital Stay (HOSPITAL_COMMUNITY): Payer: Medicare HMO

## 2017-08-15 DIAGNOSIS — E039 Hypothyroidism, unspecified: Secondary | ICD-10-CM | POA: Diagnosis present

## 2017-08-15 DIAGNOSIS — Y95 Nosocomial condition: Secondary | ICD-10-CM | POA: Diagnosis not present

## 2017-08-15 DIAGNOSIS — S82871C Displaced pilon fracture of right tibia, initial encounter for open fracture type IIIA, IIIB, or IIIC: Secondary | ICD-10-CM | POA: Diagnosis present

## 2017-08-15 DIAGNOSIS — D649 Anemia, unspecified: Secondary | ICD-10-CM | POA: Diagnosis not present

## 2017-08-15 DIAGNOSIS — R4182 Altered mental status, unspecified: Secondary | ICD-10-CM | POA: Diagnosis present

## 2017-08-15 DIAGNOSIS — I739 Peripheral vascular disease, unspecified: Secondary | ICD-10-CM | POA: Diagnosis present

## 2017-08-15 DIAGNOSIS — T148XXA Other injury of unspecified body region, initial encounter: Secondary | ICD-10-CM | POA: Diagnosis not present

## 2017-08-15 DIAGNOSIS — N401 Enlarged prostate with lower urinary tract symptoms: Secondary | ICD-10-CM | POA: Diagnosis present

## 2017-08-15 DIAGNOSIS — E785 Hyperlipidemia, unspecified: Secondary | ICD-10-CM | POA: Diagnosis not present

## 2017-08-15 DIAGNOSIS — S9701XA Crushing injury of right ankle, initial encounter: Secondary | ICD-10-CM | POA: Diagnosis present

## 2017-08-15 DIAGNOSIS — Z953 Presence of xenogenic heart valve: Secondary | ICD-10-CM | POA: Diagnosis not present

## 2017-08-15 DIAGNOSIS — R6510 Systemic inflammatory response syndrome (SIRS) of non-infectious origin without acute organ dysfunction: Secondary | ICD-10-CM | POA: Diagnosis not present

## 2017-08-15 DIAGNOSIS — K08409 Partial loss of teeth, unspecified cause, unspecified class: Secondary | ICD-10-CM | POA: Diagnosis present

## 2017-08-15 DIAGNOSIS — S96021A Laceration of muscle and tendon of long flexor muscle of toe at ankle and foot level, right foot, initial encounter: Secondary | ICD-10-CM | POA: Diagnosis present

## 2017-08-15 DIAGNOSIS — S82831C Other fracture of upper and lower end of right fibula, initial encounter for open fracture type IIIA, IIIB, or IIIC: Secondary | ICD-10-CM | POA: Diagnosis present

## 2017-08-15 DIAGNOSIS — T1490XA Injury, unspecified, initial encounter: Secondary | ICD-10-CM | POA: Diagnosis present

## 2017-08-15 DIAGNOSIS — S82891S Other fracture of right lower leg, sequela: Secondary | ICD-10-CM | POA: Diagnosis not present

## 2017-08-15 DIAGNOSIS — S85171A Laceration of posterior tibial artery, right leg, initial encounter: Secondary | ICD-10-CM | POA: Diagnosis present

## 2017-08-15 DIAGNOSIS — Z952 Presence of prosthetic heart valve: Secondary | ICD-10-CM | POA: Diagnosis not present

## 2017-08-15 DIAGNOSIS — S86021A Laceration of right Achilles tendon, initial encounter: Secondary | ICD-10-CM | POA: Diagnosis present

## 2017-08-15 DIAGNOSIS — I10 Essential (primary) hypertension: Secondary | ICD-10-CM | POA: Diagnosis present

## 2017-08-15 DIAGNOSIS — R351 Nocturia: Secondary | ICD-10-CM | POA: Diagnosis present

## 2017-08-15 DIAGNOSIS — D62 Acute posthemorrhagic anemia: Secondary | ICD-10-CM | POA: Diagnosis present

## 2017-08-15 DIAGNOSIS — Y92007 Garden or yard of unspecified non-institutional (private) residence as the place of occurrence of the external cause: Secondary | ICD-10-CM | POA: Diagnosis not present

## 2017-08-15 DIAGNOSIS — J181 Lobar pneumonia, unspecified organism: Secondary | ICD-10-CM | POA: Diagnosis not present

## 2017-08-15 DIAGNOSIS — Z89511 Acquired absence of right leg below knee: Secondary | ICD-10-CM | POA: Diagnosis not present

## 2017-08-15 DIAGNOSIS — W28XXXA Contact with powered lawn mower, initial encounter: Secondary | ICD-10-CM | POA: Diagnosis not present

## 2017-08-15 DIAGNOSIS — S82201B Unspecified fracture of shaft of right tibia, initial encounter for open fracture type I or II: Secondary | ICD-10-CM | POA: Diagnosis present

## 2017-08-15 DIAGNOSIS — G8918 Other acute postprocedural pain: Secondary | ICD-10-CM | POA: Diagnosis not present

## 2017-08-15 DIAGNOSIS — D696 Thrombocytopenia, unspecified: Secondary | ICD-10-CM | POA: Diagnosis present

## 2017-08-15 DIAGNOSIS — I272 Pulmonary hypertension, unspecified: Secondary | ICD-10-CM | POA: Diagnosis present

## 2017-08-15 DIAGNOSIS — E871 Hypo-osmolality and hyponatremia: Secondary | ICD-10-CM | POA: Diagnosis not present

## 2017-08-15 DIAGNOSIS — S8401XA Injury of tibial nerve at lower leg level, right leg, initial encounter: Secondary | ICD-10-CM | POA: Diagnosis present

## 2017-08-15 DIAGNOSIS — R509 Fever, unspecified: Secondary | ICD-10-CM | POA: Diagnosis not present

## 2017-08-15 DIAGNOSIS — G40909 Epilepsy, unspecified, not intractable, without status epilepticus: Secondary | ICD-10-CM | POA: Diagnosis present

## 2017-08-15 DIAGNOSIS — D638 Anemia in other chronic diseases classified elsewhere: Secondary | ICD-10-CM | POA: Diagnosis present

## 2017-08-15 DIAGNOSIS — I2510 Atherosclerotic heart disease of native coronary artery without angina pectoris: Secondary | ICD-10-CM | POA: Diagnosis present

## 2017-08-15 LAB — CBC
HEMATOCRIT: 25.4 % — AB (ref 39.0–52.0)
Hemoglobin: 7.5 g/dL — ABNORMAL LOW (ref 13.0–17.0)
MCH: 21.2 pg — AB (ref 26.0–34.0)
MCHC: 29.5 g/dL — AB (ref 30.0–36.0)
MCV: 72 fL — ABNORMAL LOW (ref 78.0–100.0)
Platelets: 142 10*3/uL — ABNORMAL LOW (ref 150–400)
RBC: 3.53 MIL/uL — AB (ref 4.22–5.81)
RDW: 17.2 % — ABNORMAL HIGH (ref 11.5–15.5)
WBC: 13.6 10*3/uL — ABNORMAL HIGH (ref 4.0–10.5)

## 2017-08-15 LAB — PREPARE RBC (CROSSMATCH)

## 2017-08-15 LAB — CREATININE, SERUM
CREATININE: 1.12 mg/dL (ref 0.61–1.24)
GFR calc Af Amer: >60 mL/min (ref >60)
GFR calc non Af Amer: 56 mL/min — ABNORMAL LOW (ref >60)

## 2017-08-15 LAB — SURGICAL PCR SCREEN
MRSA, PCR: NEGATIVE
STAPHYLOCOCCUS AUREUS: NEGATIVE

## 2017-08-15 LAB — GENTAMICIN LEVEL, RANDOM: GENTAMICIN RM: 5.5 ug/mL

## 2017-08-15 MED ORDER — ACETAMINOPHEN 500 MG PO TABS
1000.0000 mg | ORAL_TABLET | Freq: Three times a day (TID) | ORAL | Status: DC
  Administered 2017-08-15 – 2017-08-23 (×21): 1000 mg via ORAL
  Filled 2017-08-15 (×22): qty 2

## 2017-08-15 MED ORDER — SIMVASTATIN 40 MG PO TABS
40.0000 mg | ORAL_TABLET | Freq: Every day | ORAL | Status: DC
  Administered 2017-08-15 – 2017-08-23 (×8): 40 mg via ORAL
  Filled 2017-08-15 (×9): qty 1

## 2017-08-15 MED ORDER — SENNA 8.6 MG PO TABS
1.0000 | ORAL_TABLET | Freq: Two times a day (BID) | ORAL | Status: DC
  Administered 2017-08-15 – 2017-08-17 (×4): 8.6 mg via ORAL
  Filled 2017-08-15 (×4): qty 1

## 2017-08-15 MED ORDER — CEFAZOLIN SODIUM-DEXTROSE 1-4 GM/50ML-% IV SOLN
1.0000 g | Freq: Three times a day (TID) | INTRAVENOUS | Status: DC
  Administered 2017-08-15: 1 g via INTRAVENOUS
  Filled 2017-08-15 (×3): qty 50

## 2017-08-15 MED ORDER — SODIUM CHLORIDE 0.9 % IV SOLN
INTRAVENOUS | Status: DC
  Administered 2017-08-15 – 2017-08-17 (×5): via INTRAVENOUS

## 2017-08-15 MED ORDER — MUPIROCIN 2 % EX OINT
1.0000 "application " | TOPICAL_OINTMENT | Freq: Two times a day (BID) | CUTANEOUS | Status: AC
  Administered 2017-08-15 – 2017-08-17 (×4): 1 via NASAL
  Filled 2017-08-15 (×2): qty 22

## 2017-08-15 MED ORDER — METHOCARBAMOL 1000 MG/10ML IJ SOLN
500.0000 mg | Freq: Four times a day (QID) | INTRAVENOUS | Status: DC | PRN
  Filled 2017-08-15: qty 5

## 2017-08-15 MED ORDER — ONDANSETRON HCL 4 MG PO TABS: 4.0000 mg | ORAL_TABLET | Freq: Four times a day (QID) | ORAL | Status: DC | PRN

## 2017-08-15 MED ORDER — GENTAMICIN SULFATE 40 MG/ML IJ SOLN
520.0000 mg | Freq: Once | INTRAVENOUS | Status: AC
  Administered 2017-08-15: 520 mg via INTRAVENOUS
  Filled 2017-08-15: qty 13

## 2017-08-15 MED ORDER — OXYCODONE HCL 5 MG PO TABS
10.0000 mg | ORAL_TABLET | ORAL | Status: DC | PRN
  Administered 2017-08-15: 10 mg via ORAL
  Administered 2017-08-15 (×2): 15 mg via ORAL
  Administered 2017-08-16 – 2017-08-18 (×6): 10 mg via ORAL
  Administered 2017-08-21: 15 mg via ORAL
  Filled 2017-08-15 (×4): qty 3

## 2017-08-15 MED ORDER — CEFAZOLIN SODIUM-DEXTROSE 1-4 GM/50ML-% IV SOLN
1.0000 g | Freq: Four times a day (QID) | INTRAVENOUS | Status: DC
  Administered 2017-08-15 (×2): 1 g via INTRAVENOUS
  Filled 2017-08-15 (×3): qty 50

## 2017-08-15 MED ORDER — SODIUM CHLORIDE 0.9% IV SOLUTION
Freq: Once | INTRAVENOUS | Status: AC
  Administered 2017-08-15: 14:00:00 via INTRAVENOUS

## 2017-08-15 MED ORDER — METHOCARBAMOL 500 MG PO TABS
500.0000 mg | ORAL_TABLET | Freq: Four times a day (QID) | ORAL | Status: DC | PRN
  Administered 2017-08-15 – 2017-08-23 (×9): 500 mg via ORAL
  Filled 2017-08-15 (×9): qty 1

## 2017-08-15 MED ORDER — ACETAMINOPHEN 325 MG PO TABS
325.0000 mg | ORAL_TABLET | Freq: Four times a day (QID) | ORAL | Status: DC | PRN
  Administered 2017-08-15: 650 mg via ORAL
  Filled 2017-08-15: qty 2

## 2017-08-15 MED ORDER — ONDANSETRON HCL 4 MG/2ML IJ SOLN: 4.0000 mg | Freq: Four times a day (QID) | INTRAMUSCULAR | Status: DC | PRN

## 2017-08-15 MED ORDER — TAMSULOSIN HCL 0.4 MG PO CAPS
0.4000 mg | ORAL_CAPSULE | Freq: Every day | ORAL | Status: DC
  Administered 2017-08-15 – 2017-08-23 (×8): 0.4 mg via ORAL
  Filled 2017-08-15 (×9): qty 1

## 2017-08-15 MED ORDER — LEVOTHYROXINE SODIUM 50 MCG PO TABS
50.0000 ug | ORAL_TABLET | Freq: Every day | ORAL | Status: DC
  Administered 2017-08-17 – 2017-08-23 (×6): 50 ug via ORAL
  Filled 2017-08-15 (×7): qty 1

## 2017-08-15 MED ORDER — DOCUSATE SODIUM 100 MG PO CAPS
100.0000 mg | ORAL_CAPSULE | Freq: Two times a day (BID) | ORAL | Status: DC
  Administered 2017-08-15 – 2017-08-23 (×16): 100 mg via ORAL
  Filled 2017-08-15 (×16): qty 1

## 2017-08-15 MED ORDER — DIPHENHYDRAMINE HCL 12.5 MG/5ML PO ELIX: 12.5000 mg | ORAL_SOLUTION | ORAL | Status: DC | PRN

## 2017-08-15 MED ORDER — DEXTROSE 5 % IV SOLN
520.0000 mg | INTRAVENOUS | Status: DC
  Administered 2017-08-16 – 2017-08-18 (×2): 520 mg via INTRAVENOUS
  Filled 2017-08-15 (×3): qty 13

## 2017-08-15 MED ORDER — OXYCODONE HCL 5 MG PO TABS
5.0000 mg | ORAL_TABLET | ORAL | Status: DC | PRN
  Administered 2017-08-17 – 2017-08-23 (×8): 10 mg via ORAL
  Filled 2017-08-15 (×16): qty 2

## 2017-08-15 MED ORDER — HYDROMORPHONE HCL 2 MG/ML IJ SOLN
0.5000 mg | INTRAMUSCULAR | Status: DC | PRN
  Administered 2017-08-15 – 2017-08-16 (×3): 1 mg via INTRAVENOUS
  Filled 2017-08-15 (×3): qty 1

## 2017-08-15 MED ORDER — FENTANYL CITRATE (PF) 100 MCG/2ML IJ SOLN
INTRAMUSCULAR | Status: AC
  Filled 2017-08-15: qty 2

## 2017-08-15 NOTE — Progress Notes (Signed)
Left radial arterial line removed per order.  Pressure held for 30 minutes.  Pressure dressing applied with no active bleeding noted.  Will continue to monitor.

## 2017-08-15 NOTE — Progress Notes (Signed)
Pharmacy Antibiotic Note  Charles KaufmannDavid B Patrick is a 84 y.o. male admitted on 08/14/2017 with open grade 3 right tibia and fibular fractures with complex posterior ankle laceration and laceration of the Achilles tendon requiring surgical intervention.  Pharmacy has been consulted for gentamicin dosing.  10 hr gent level = 5.5, indicates for Q 36 hr dosing  Plan: Gentamicin 520mg  Q 36 hrs, next dose tomorrow at 1600 Follow-up LOT.  Height: 6\' 2"  (188 cm) Weight: 170 lb (77.1 kg) IBW/kg (Calculated) : 82.2  Temp (24hrs), Avg:98.2 F (36.8 C), Min:97.5 F (36.4 C), Max:99.1 F (37.3 C)  Recent Labs  Lab 08/14/17 1843 08/14/17 1900 08/15/17 0527 08/15/17 1251  WBC 12.1*  --  13.6*  --   CREATININE  --  1.10 1.12  --   GENTRANDOM  --   --   --  5.5    Estimated Creatinine Clearance: 47.8 mL/min (by C-G formula based on SCr of 1.12 mg/dL).    No Known Allergies   Thank you for allowing pharmacy to be a part of this patient's care.  Bayard HuggerMei Eisha Chatterjee, PharmD, BCPS  Clinical Pharmacist  Pager: [phone]   08/15/2017 6:30 PM

## 2017-08-15 NOTE — Plan of Care (Signed)
  Problem: Education: Goal: Knowledge of General Education information will improve Outcome: Progressing   

## 2017-08-15 NOTE — Op Note (Signed)
08/14/2017 - 08/15/2017  12:11 AM  PATIENT:  Charles Patrick  84 y.o. male  PRE-OPERATIVE DIAGNOSIS:  open right tibia and fibula fractures  POST-OPERATIVE DIAGNOSIS: 1.  Grade IIIB open right tibia and fibula fractures 2.  Laceration of right posterior tibial tendon, flexor digitorum longus, posterior tibial artery, tibial nerve (partial), flexor hallucis longus and achilles tendon 3.  Complex wounds of postero medial right leg totaling 25 cm in length  Procedure(s): 1.  Irrigation and excisional debridement of open right tibia and fibula fractures 2.  Complex closure of right leg wounds totaling 25 cm in length 3.  Open reduction of tibia and fibula fractures 4.  Application of multiplane external fixator 5.  Application of wound VAC  SURGEON:  Toni Arthurs, MD  ASSISTANT: Alfredo Martinez, PA-C  ANESTHESIA:   General  EBL:  minimal   TOURNIQUET:  1:10 at 250 mm Hg  COMPLICATIONS:  None apparent  DISPOSITION:  Extubated, awake and stable to recovery.  INDICATION FOR PROCEDURE: The patient is a 84 year old male with a past medical history significant for recent aortic valve surgery.  He was mowing on a 0 turn mower this afternoon on wet grass.  The lower slid down an embankment and into a brick wall pinching his leg between the mower deck in the brick wall.  He came to the emergency department where radiographs revealed tibia and fibular fractures.  He was also noted to have a complex laceration at the posterior aspect of his right leg.  He presents now for operative treatment of these unstable and open tibia and fibula fractures.  The risks and benefits of the alternative treatment options have been discussed in detail.  The patient wishes to proceed with surgery and specifically understands risks of bleeding, infection, nerve damage, blood clots, need for additional surgery, amputation and death.  PROCEDURE IN DETAIL:  After pre operative consent was obtained, and the correct operative  site was identified, the patient was brought to the operating room and placed supine on the OR table.  Anesthesia was administered.  Pre-operative antibiotics were administered.  A surgical timeout was taken.  The right lower extremity was prepped and draped in standard sterile fashion with a tourniquet around the thigh.  The extremity was elevated and the tourniquet inflated to 250 mmHg.  The leg wound was carefully inspected.  It was a complex stellate pattern with multiple parallel lacerations and flaps of skin.  It measured approximately 6 cm x 5 cm in total.  The wound was then circumferentially debrided taking care to excise all nonviable tissue.  There was gross contamination with grass and dirt throughout the wound.  There were multiple loose bone fragments identified as well.  Excisional debridement was performed circumferentially from the level of the skin down through the subcutaneous tissues including muscle and bone.  The posterior tibial tendon was noted to be lacerated along with the flexor digitorum longus tendon and flexor hallucis longus tendon.  The posterior tibial artery was lacerated.  The tibial nerve was lacerated approximately two thirds of its cross-sectional area with a few fibers remaining intact.  The Achilles tendon was also completely lacerated.  The wound was then copiously irrigated with 3 L of normal saline.  Excisional debridement was then approximated to the extent possible using simple sutures of pressure tubing.  Appropriate seal was achieved.  The leg was then externally rotated.  Schanz pins were inserted into the tibia and percutaneous fashion after predrilling.  A transfixion pin  was then placed to the calcaneus from lateral to medial taking care to protect the neurovascular structures medially.  I am tightened appropriately.  AP and lateral radiographs confirmed appropriate position and length of all hardware and appropriate reduction of the tibia and fibula  fractures.  Sterile dressings were applied followed by a well-padded short leg splint.  Tourniquet was released after application of the splint.  Hemostasis was achieved with no excessive bleeding noted in the wound VAC canister.  Patient was then awakened from anesthesia and transported to the recovery room in stable condition.   FOLLOW UP PLAN: The patient will be admitted for IV antibiotics.  Consultation will be obtained from Drs. Handy and Haddix for further treatment of these severe wounds.  The patient will likely need multiple return trips to the operating room for additional procedures related to this severe injury.    Alfredo MartinezJustin Ollis PA-C was present and scrubbed for the duration of the operative case. His assistance was essential in positioning the patient, prepping and draping, gaining and maintaining exposure, performing the operation, closing and dressing the wounds and applying the splint.

## 2017-08-15 NOTE — Progress Notes (Addendum)
PROGRESS NOTE                                                                                                                                                                                                             Patient Demographics:    Charles Patrick, is a 84 y.o. male, DOB - 1924/05/11, ZOX:[id number]  Admit Charles Abbedate - 08/14/2017   Admitting Physician Toni ArthursJohn Hewitt, MD  Outpatient Primary MD for the patient is [name], Suszanne ConnersMichael R, PA-C  LOS - 0   Chief Complaint  Patient presents with  . Extremity Laceration    Cut by Lawnmower       Brief Narrative   84 year old male with history of severe aortic stenosis status post TAVR last November, hyperlipidemia, BPH, chronic anemia, hypothyroidism had injured as right lower extremity with lawnmower .  Patient has significant bleed and x-rays done in the ER revealed fracture of the tibia.  Went for surgical repair by Dr. Si GaulHu with 08/15/2017, and were consulted for medical management   Subjective:    Charles AbbeDavid Patrick today has, No headache, No chest pain, No abdominal pain -planes of right lower extremity pain   Assessment  & Plan :    Active Problems:   HTN (hypertension)   HLD (hyperlipidemia)   Hypothyroidism   S/P TAVR (transcatheter aortic valve replacement)   Open right ankle fracture   Right lower extremity injury with tibial and fibula fracture -Treatment per primary orthopedic team, status post irrigation and excisional debridement, with open reduction of tibia and fibula fracture and application of multiplane external fixator and wound VAC. -DVT prophylaxis and pain management per orthopedic team.  Status post TAVR  -Appears to be euvolemic, continue to monitor closely  Hypothyroidism  -Resume home dose Synthroid  Anemia -Neck microcytic anemia, as well acute blood loss anemia postoperatively, hemoglobin is 7.5, I will transfuse 1 unit PRBC today.  BPH  -Continue with  tamsulosin  Hyperlipidemia  -Continue home dose statin  Resume aspirin and Plavix once cleared by Ortho     Code Status : Full  Family Communication  : none at bedside  Procedures  :  By Dr. Beather ArbourJohn Whitt on 08/15/2017 1.  Irrigation and excisional debridement of open right tibia and fibula fractures 2.  Complex closure of right leg wounds totaling 25 cm in length 3.  Open  reduction of tibia and fibula fractures 4.  Application of multiplane external fixator 5.  Application of wound VAC    DVT Prophylaxis  : Per Primary orthopedic team  Lab Results  Component Value Date   PLT 142 (L) 08/15/2017    Antibiotics  :    Anti-infectives (From admission, onward)   Start     Dose/Rate Route Frequency Ordered Stop   08/15/17 0400  ceFAZolin (ANCEF) IVPB 1 g/50 mL premix     1 g 100 mL/hr over 30 Minutes Intravenous Every 6 hours 08/15/17 0226 08/15/17 2159   08/15/17 0245  gentamicin (GARAMYCIN) 520 mg in dextrose 5 % 100 mL IVPB     520 mg 113 mL/hr over 60 Minutes Intravenous  Once 08/15/17 0231 08/15/17 0356   08/14/17 1845  ceFAZolin (ANCEF) IVPB 1 g/50 mL premix     1 g 100 mL/hr over 30 Minutes Intravenous  Once 08/14/17 1840 08/14/17 2034        Objective:   Vitals:   08/15/17 0115 08/15/17 0130 08/15/17 0230 08/15/17 0423  BP: 123/90 106/73 (!) 127/91 102/72  Pulse: 87 86 87 92  Resp: 11 (!) 28 16 16   Temp:  (!) 97.5 F (36.4 C) 97.8 F (36.6 C) 98.8 F (37.1 C)  TempSrc:   Oral Oral  SpO2: 96% 100%    Weight:      Height:        Wt Readings from Last 3 Encounters:  08/14/17 77.1 kg (170 lb)  02/13/17 78.7 kg (173 lb 8 oz)  01/30/17 79.4 kg (175 lb)     Intake/Output Summary (Last 24 hours) at 08/15/2017 1121 Last data filed at 08/15/2017 0800 Gross per 24 hour  Intake 2745.76 ml  Output 250 ml  Net 2495.76 ml     Physical Exam  Awake Alert, Oriented X 3, No new F.N deficits, Normal affect Symmetrical Chest wall movement, Good air movement  bilaterally, CTAB RRR,No Gallops,Rubs , No Parasternal Heave +ve B.Sounds, Abd Soft,No rebound - guarding or rigidity. Lower extremity bandages, with external fixation device applied    Data Review:    CBC Recent Labs  Lab 08/14/17 1843 08/14/17 1900 08/15/17 0527  WBC 12.1*  --  13.6*  HGB 10.2* 10.9* 7.5*  HCT 34.2* 32.0* 25.4*  PLT 206  --  142*  MCV 71.5*  --  72.0*  MCH 21.3*  --  21.2*  MCHC 29.8*  --  29.5*  RDW 17.2*  --  17.2*  LYMPHSABS 4.1*  --   --   MONOABS 0.8  --   --   EOSABS 0.3  --   --   BASOSABS 0.1  --   --     Chemistries  Recent Labs  Lab 08/14/17 1900 08/15/17 0527  NA 143  --   K 3.6  --   CL 107  --   GLUCOSE 151*  --   BUN 15  --   CREATININE 1.10 1.12   ------------------------------------------------------------------------------------------------------------------ No results for input(s): CHOL, HDL, LDLCALC, TRIG, CHOLHDL, LDLDIRECT in the last 72 hours.  Lab Results  Component Value Date   HGBA1C 5.6 01/18/2017   ------------------------------------------------------------------------------------------------------------------ No results for input(s): TSH, T4TOTAL, T3FREE, THYROIDAB in the last 72 hours.  Invalid input(s): FREET3 ------------------------------------------------------------------------------------------------------------------ No results for input(s): VITAMINB12, FOLATE, FERRITIN, TIBC, IRON, RETICCTPCT in the last 72 hours.  Coagulation profile No results for input(s): INR, PROTIME in the last 168 hours.  No results for input(s): DDIMER in the  last 72 hours.  Cardiac Enzymes No results for input(s): CKMB, TROPONINI, MYOGLOBIN in the last 168 hours.  Invalid input(s): CK ------------------------------------------------------------------------------------------------------------------    Component Value Date/Time   BNP 109.0 (H) 01/18/2017 1131    Inpatient Medications  Scheduled Meds: . docusate  sodium  100 mg Oral BID  . fentaNYL      . mupirocin ointment  1 application Nasal BID  . senna  1 tablet Oral BID   Continuous Infusions: . sodium chloride Stopped (08/15/17 0123)  .  ceFAZolin (ANCEF) IV 1 g (08/15/17 9528)  . methocarbamol (ROBAXIN)  IV     PRN Meds:.[START ON 08/16/2017] acetaminophen, diphenhydrAMINE, HYDROmorphone (DILAUDID) injection, methocarbamol **OR** methocarbamol (ROBAXIN)  IV, ondansetron **OR** ondansetron (ZOFRAN) IV, oxyCODONE, oxyCODONE  Micro Results Recent Results (from the past 240 hour(s))  Surgical PCR screen     Status: None   Collection Time: 08/15/17  7:48 AM  Result Value Ref Range Status   MRSA, PCR NEGATIVE NEGATIVE Final   Staphylococcus aureus NEGATIVE NEGATIVE Final    Comment: (NOTE) The Xpert SA Assay (FDA approved for NASAL specimens in patients 42 years of age and older), is one component of a comprehensive surveillance program. It is not intended to diagnose infection nor to guide or monitor treatment. Performed at [facility], [address], [place], [place] [address]     Radiology Reports Dg Tibia/fibula Right  Result Date: 08/15/2017 CLINICAL DATA:  External fixation of right ankle with irrigation debridement and application of wound VAC. EXAM: RIGHT TIBIA AND FIBULA - 2 VIEW COMPARISON:  Preoperative radiographs from 08/14/2017 FINDINGS: Five C-arm fluoroscopic views demonstrate external fixation across an open fracture of the distal tibial and fibular diaphysis. Fine bony detail is somewhat limited due to the fluoroscopic technique. A total of 9 seconds of fluoroscopic time was utilized. IMPRESSION: C-arm fluoroscopic views demonstrates placement of an external fixation device across a known comminuted open fracture of the distal tibia and fibula. Electronically Signed   By: Tollie Eth M.D.   On: 08/15/2017 01:46   Dg Chest Portable 1 View  Result Date: 08/14/2017 CLINICAL DATA:  Struck by lawnmower. EXAM: PORTABLE  CHEST 1 VIEW COMPARISON:  Chest radiograph January 22, 2017 FINDINGS: Cardiac silhouette is mildly enlarged, unchanged. Calcified aortic arch, potential ectasia. Status post cardiac valve replacement. Mild bibasilar strandy densities without pleural effusion or focal consolidation. No pneumothorax. Soft tissue planes and included osseous structures are non sub suspicious.Old LEFT rib fracture. IMPRESSION: Bibasilar atelectasis/scarring. Stable cardiomegaly. Aortic Atherosclerosis (ICD10-I70.0). Electronically Signed   By: Awilda Metro M.D.   On: 08/14/2017 19:15   Dg Tibia/fibula Right Port  Result Date: 08/14/2017 CLINICAL DATA:  84 year old male status post trauma from spinning lung lower blades. EXAM: PORTABLE RIGHT TIBIA AND FIBULA - 2 VIEW COMPARISON:  None. FINDINGS: AP and cross-table lateral views. Severe oblique penetrating trauma through the right tibia and fibula meta diaphyses. Severely comminuted fractures with numerous small bone fragments scattered in the adjacent soft tissues. Severe associated soft tissue injury. Oblique superior to inferior and anterior to posterior trajectory. Mild lateral displacement and medial angulation of the distal fragments. Fracture of the tibia extends toward distal tibia fibular syndesmosis, but appears to stop short of the mortise joint. Right mortise joint remains normally aligned. Talar dome, and malleoli intact. Calcaneus intact. Superimposed calcified peripheral vascular disease. The mid and proximal right tibia and fibula are intact. Maintained normal alignment at the right knee. Visible bones of the right foot appear  intact. IMPRESSION: 1. Severe penetrating trauma through the right tibia and fibula at an oblique angle with severely comminuted fractures of both bones and numerous small bone fragments. 2. Distal tibia fracture likely extends into the distal tibia-fibular syndesmosis, but appears to spare the mortise joint. Electronically Signed   By: Odessa Fleming M.D.   On: 08/14/2017 19:21   Dg C-arm 1-60 Min-no Report  Result Date: 08/15/2017 Fluoroscopy was utilized by the requesting physician.  No radiographic interpretation.     Huey Bienenstock M.D on 08/15/2017 at 11:21 AM  Between 7am to 7pm - Pager - [phone]  After 7pm go to [email or web address] - password [facility]  [facility] -  Office  [phone]

## 2017-08-15 NOTE — Progress Notes (Addendum)
Pharmacy Antibiotic Note  Charles KaufmannDavid B Patrick is a 84 y.o. male admitted on 08/14/2017 with open grade 3 right tibia and fibular fractures with complex posterior ankle laceration and laceration of the Achilles tendon requiring surgical intervention.  Pharmacy has been consulted for gentamicin dosing.  Plan: Gentamicin 520mg  x1 and check 10-hr level for further dosing. Follow-up LOT.  Height: 6\' 2"  (188 cm) Weight: 170 lb (77.1 kg) IBW/kg (Calculated) : 82.2  Temp (24hrs), Avg:97.6 F (36.4 C), Min:97.5 F (36.4 C), Max:97.8 F (36.6 C)  Recent Labs  Lab 08/14/17 1843 08/14/17 1900  WBC 12.1*  --   CREATININE  --  1.10    Estimated Creatinine Clearance: 48.7 mL/min (by C-G formula based on SCr of 1.1 mg/dL).    No Known Allergies   Thank you for allowing pharmacy to be a part of this patient's care.  Charles Patrick, PharmD, BCPS  08/15/2017 2:25 AM

## 2017-08-15 NOTE — Anesthesia Postprocedure Evaluation (Signed)
Anesthesia Post Note  Patient: Charles Patrick  Procedure(s) Performed: EXTERNAL FIXATION LEG (Right Ankle) IRRIGATION AND DEBRIDMENT RIGHT ANKLE, APPLICATION WOUND VAC (Right Ankle)     Patient location during evaluation: PACU Anesthesia Type: General Level of consciousness: awake and alert Pain management: pain level controlled Vital Signs Assessment: post-procedure vital signs reviewed and stable Respiratory status: spontaneous breathing, nonlabored ventilation, respiratory function stable and patient connected to nasal cannula oxygen Cardiovascular status: blood pressure returned to baseline and stable Postop Assessment: no apparent nausea or vomiting Anesthetic complications: no    Last Vitals:  Vitals:   08/15/17 0115 08/15/17 0130  BP: 123/90 106/73  Pulse: 87 86  Resp: 11 (!) 28  Temp:  (!) 36.4 C  SpO2: 96% 100%    Last Pain:  Vitals:   08/15/17 0130  TempSrc:   PainSc: 2                  Kennieth RadFitzgerald, Moksh Loomer E

## 2017-08-15 NOTE — Transfer of Care (Signed)
Immediate Anesthesia Transfer of Care Note  Patient: Efraim KaufmannDavid B Coby  Procedure(s) Performed: EXTERNAL FIXATION LEG (Right Ankle) IRRIGATION AND DEBRIDMENT RIGHT ANKLE, APPLICATION WOUND VAC (Right Ankle)  Patient Location: PACU  Anesthesia Type:General  Level of Consciousness: awake and alert   Airway & Oxygen Therapy: Patient Spontanous Breathing and Patient connected to nasal cannula oxygen  Post-op Assessment: Report given to RN and Post -op Vital signs reviewed and stable  Post vital signs: Reviewed and stable  Last Vitals:  Vitals Value Taken Time  BP    Temp    Pulse    Resp    SpO2      Last Pain:  Vitals:   08/14/17 2030  TempSrc:   PainSc: 7          Complications: No apparent anesthesia complications

## 2017-08-15 NOTE — Progress Notes (Signed)
Subjective: 1 Day Post-Op Procedure(s) (LRB): EXTERNAL FIXATION LEG (Right) IRRIGATION AND DEBRIDMENT RIGHT ANKLE, APPLICATION WOUND VAC (Right)  Patient reports pain as moderate.  Resting comfortably in bed.  Denies fever, chills, N/V, CP, SOB.  Objective:   VITALS:  Temp:  [97.5 F (36.4 C)-98.8 F (37.1 C)] 98.8 F (37.1 C) (06/20 0423) Pulse Rate:  [73-107] 92 (06/20 0423) Resp:  [0-28] 16 (06/20 0423) BP: (78-135)/(58-97) 102/72 (06/20 0423) SpO2:  [87 %-100 %] 100 % (06/20 0130) Weight:  [77.1 kg (170 lb)] 77.1 kg (170 lb) (06/19 1846)  General: WDWN patient in NAD. Psych:  Appropriate mood and affect. Neuro:  A&O x 3, Moving all extremities, sensation intact to light touch HEENT:  EOMs intact Chest:  Even non-labored respirations Skin: Dressing/Splint/Ex-fix intact.  Wound vac is clogged  Extremities: warm/dry, mild edema, no erythema or echymosis.  No lymphadenopathy. Pulses: Popliteus 2+ MSK:  ROM: EHL intact, MMT: able to perform quad set    LABS Recent Labs    08/14/17 1843 08/14/17 1900 08/15/17 0527  HGB 10.2* 10.9* 7.5*  WBC 12.1*  --  13.6*  PLT 206  --  142*   Recent Labs    08/14/17 1900 08/15/17 0527  NA 143  --   K 3.6  --   CL 107  --   BUN 15  --   CREATININE 1.10 1.12  GLUCOSE 151*  --    No results for input(s): LABPT, INR in the last 72 hours.   Assessment/Plan: 1 Day Post-Op Procedure(s) (LRB): EXTERNAL FIXATION LEG (Right) IRRIGATION AND DEBRIDMENT RIGHT ANKLE, APPLICATION WOUND VAC (Right)  -NWB R LE  -Reinforce dressing as needed.  -Dr. Victorino DikeHewitt discussed patient with Dr. Carola FrostHandy.  Plan for Dr. Carola FrostHandy to eventually take to OR for ex-fix removal and definitive treatment of this complex injury.  Alfredo MartinezJustin Chee Dimon PA-C [facility] Office:  [phone]

## 2017-08-15 NOTE — Anesthesia Procedure Notes (Signed)
Procedure Name: Intubation Date/Time: 08/15/2017 10:15 PM Performed by: Babs Bertin, CRNA Pre-anesthesia Checklist: Patient identified, Emergency Drugs available, Suction available and Patient being monitored Patient Re-evaluated:Patient Re-evaluated prior to induction Oxygen Delivery Method: Circle System Utilized Preoxygenation: Pre-oxygenation with 100% oxygen Induction Type: IV induction and Rapid sequence Laryngoscope Size: Mac and 4 Grade View: Grade I Tube type: Oral Tube size: 7.5 mm Number of attempts: 1 Airway Equipment and Method: Stylet and Oral airway Placement Confirmation: ETT inserted through vocal cords under direct vision,  positive ETCO2 and breath sounds checked- equal and bilateral Secured at: 23 cm Tube secured with: Tape Dental Injury: Teeth and Oropharynx as per pre-operative assessment

## 2017-08-15 NOTE — Consult Note (Addendum)
Orthopaedic Trauma Service (OTS) Consult   Patient ID: [name] MRN: [id number] DOB/AGE: 1924-04-22 84 y.o.   Reason for Consult: Complex open right distal tibia and fibula fracture Referring Physician:  Wylene Simmer, MD (ortho)   HPI: Charles Patrick is an 84 y.o.black male who is extremely active and was injured while riding his 0 turn lawnmower yesterday.  Patient was mowing a very steep hill when he slid down hit a brick wall which then pinned his leg between the wall and the lawnmower deck.  Patient had open deformity to his right leg.  He was brought to [facility] hospital for evaluation.  Found to have isolated orthopedic injury consisting of an open right distal tibia and fibula fracture.  Patient was seen and evaluated by Dr. Doran Durand of orthopedics and taken to the operating room for irrigation debridement and application of a spanning external fixator and application of a wound VAC.  There were some issues with the wound VAC overnight due to bleeding and has since been shut off.  Due to the complexity of the injury Dr. Doran Durand asserted that this was outside the scope of this practice and requested further evaluation and treatment by the orthopedic trauma service and orthopedic trauma fellowship trained surgeon.  Patient seen and evaluated by the orthopedic trauma service on 08/15/2017.  He is sitting up in bed on 5 N. 19 he is very pleasant.  He is in no acute distress.  He is eating lunch.  Communicates very well no other complaints.  States his pain is very well controlled.  Notes numbness on the plantar aspect of his right foot.  No chest pain or shortness of breath, no abdominal pain, no nausea or vomiting, no headaches or lightheadedness.   Patient states that he was wearing pants and sneakers at the time of his accident.  Recalls all the events of the accident.  He states that he is very active either riding his lawnmower or his tractor all day every day.  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  He does have CAD, hypertension hypothyroidism, thrombocytopenia (platelets on admission were 206).   Patient does not smoke, he is not diabetic   Acute blood loss anemia noted.  Not unexpected given open fracture to his right distal tibia.  Originally 1 unit of PRBCs were ordered, I did order an additional unit, the patient will get 2 units of PRBCs today.  Past Medical History:  Diagnosis Date  . Anemia   . Aortic valve stenosis   . Arthropathy   . BPH associated with nocturia   . Carotid artery disease ([place])   . Colon polyp   . Dilated aortic root ([place])   . Elevated PSA   . Erectile dysfunction   . Gastric paresis   . Hypercholesterolemia   . Hyperglycemia   . Hypertension    Patient denies  . Hypothyroid   . Iron deficiency   . Pulmonary hypertension ([place])   . 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 Seizure disorder ([place])   . Thrombocytopenia ([place])   . Thyroid nodule   . Tricuspid regurgitation   . Vitamin D deficiency     Past Surgical History:  Procedure Laterality Date  . EXTERNAL FIXATION LEG Right  08/14/2017   Procedure: EXTERNAL FIXATION LEG;  Surgeon: Wylene Simmer, MD;  Location: [place];  Service: Orthopedics;  Laterality: Right;  . INCISION AND DRAINAGE Right 08/14/2017   Procedure: IRRIGATION AND DEBRIDMENT RIGHT ANKLE, APPLICATION WOUND VAC;  Surgeon: Wylene Simmer, MD;  Location: [place];  Service: Orthopedics;  Laterality: Right;  . MULTIPLE EXTRACTIONS WITH ALVEOLOPLASTY N/A 10/17/2016   Procedure: Extraction of tooth #'s 4, 21,22,27,28,29 with alveoloplasty;  Surgeon: Lenn Cal, DDS;  Location: [place];  Service: Oral Surgery;  Laterality: N/A;  . NO PAST SURGERIES    .  RIGHT/LEFT HEART CATH AND CORONARY ANGIOGRAPHY N/A 01/11/2017   Procedure: RIGHT/LEFT HEART CATH AND CORONARY ANGIOGRAPHY;  Surgeon: Sherren Mocha, MD;  Location: [place] [facility];  Service: Cardiovascular;  Laterality: N/A;  . TEE WITHOUT CARDIOVERSION N/A 01/22/2017   Procedure: TRANSESOPHAGEAL ECHOCARDIOGRAM (TEE);  Surgeon: Sherren Mocha, MD;  Location: [place];  Service: Open Heart Surgery;  Laterality: N/A;  . TRANSCATHETER AORTIC VALVE REPLACEMENT, TRANSFEMORAL N/A 01/22/2017   Procedure: TRANSCATHETER AORTIC VALVE REPLACEMENT, TRANSFEMORAL using Edwards 29 Sapien 3 Aortic Valve;  Surgeon: Sherren Mocha, MD;  Location: [place];  Service: Open Heart Surgery;  Laterality: N/A;    Family History  Problem Relation Age of Onset  . Healthy Mother   . Healthy Father     Social History:  reports that he has never smoked. He has never used smokeless tobacco. He reports that he does not drink alcohol or use drugs.  Allergies: No Known Allergies  Medications: I have reviewed the patient's current medications. Current Meds  Medication Sig  . aspirin EC 81 MG tablet Take 81 mg by mouth daily.   . clopidogrel (PLAVIX) 75 MG tablet Take 1 tablet (75 mg total) by mouth daily with breakfast.  . levothyroxine (SYNTHROID, LEVOTHROID) 50 MCG tablet Take 50 mcg by mouth daily before breakfast.   . Multiple Vitamin (MULTIVITAMIN) capsule Take 1 capsule by mouth daily.  . simvastatin (ZOCOR) 40 MG tablet Take 40 mg by mouth daily at 6 PM.   . tamsulosin (FLOMAX) 0.4 MG CAPS capsule Take 0.4 mg by mouth daily after supper.      Results for orders placed or performed during the hospital encounter of 08/14/17 (from the past 48 hour(s))  Type and screen [place]     Status: None (Preliminary result)   Collection Time: 08/14/17  6:42 PM  Result Value Ref Range   ABO/RH(D) O POS    Antibody Screen NEG    Sample Expiration 08/17/2017    Unit Number [id number]    Blood  Component Type RED CELLS,LR    Unit division 00    Status of Unit ALLOCATED    Transfusion Status OK TO TRANSFUSE    Crossmatch Result      Compatible Performed at [facility], [address], [place], [place] [address]    Unit Number [id number]    Blood Component Type RED CELLS,LR    Unit division 00    Status of Unit ALLOCATED    Transfusion Status OK TO TRANSFUSE    Crossmatch Result Compatible    Unit Number [id number]    Blood Component Type RED CELLS,LR    Unit division 00    Status of Unit ALLOCATED    Transfusion Status OK TO TRANSFUSE    Crossmatch Result Compatible   CBC with Differential     Status: Abnormal   Collection Time: 08/14/17  6:43 PM  Result  Value Ref Range   WBC 12.1 (H) 4.0 - 10.5 K/uL   RBC 4.78 4.22 - 5.81 MIL/uL   Hemoglobin 10.2 (L) 13.0 - 17.0 g/dL   HCT 34.2 (L) 39.0 - 52.0 %   MCV 71.5 (L) 78.0 - 100.0 fL   MCH 21.3 (L) 26.0 - 34.0 pg   MCHC 29.8 (L) 30.0 - 36.0 g/dL   RDW 17.2 (H) 11.5 - 15.5 %   Platelets 206 150 - 400 K/uL   Neutrophils Relative % 57 %   Neutro Abs 6.8 1.7 - 7.7 K/uL   Lymphocytes Relative 34 %   Lymphs Abs 4.1 (H) 0.7 - 4.0 K/uL   Monocytes Relative 6 %   Monocytes Absolute 0.8 0.1 - 1.0 K/uL   Eosinophils Relative 2 %   Eosinophils Absolute 0.3 0.0 - 0.7 K/uL   Basophils Relative 1 %   Basophils Absolute 0.1 0.0 - 0.1 K/uL   Immature Granulocytes 0 %   Abs Immature Granulocytes 0.0 0.0 - 0.1 K/uL    Comment: Performed at [facility], [address], [place], [place] [address]  Sample to Blood Bank     Status: None   Collection Time: 08/14/17  6:44 PM  Result Value Ref Range   Blood Bank Specimen SAMPLE AVAILABLE FOR TESTING    Sample Expiration      08/15/2017 Performed at [facility], [place] [address], [place], [place] [address]   I-stat Chem 8, ED     Status: Abnormal   Collection Time: 08/14/17  7:00 PM  Result Value Ref Range   Sodium 143 135 - 145 mmol/L   Potassium 3.6 3.5 -  5.1 mmol/L   Chloride 107 101 - 111 mmol/L   BUN 15 6 - 20 mg/dL   Creatinine, Ser 1.10 0.61 - 1.24 mg/dL   Glucose, Bld 151 (H) 65 - 99 mg/dL   Calcium, Ion 1.10 (L) 1.15 - 1.40 mmol/L   TCO2 24 22 - 32 mmol/L   Hemoglobin 10.9 (L) 13.0 - 17.0 g/dL   HCT 32.0 (L) 39.0 - 52.0 %  CBC     Status: Abnormal   Collection Time: 08/15/17  5:27 AM  Result Value Ref Range   WBC 13.6 (H) 4.0 - 10.5 K/uL    Comment: REPEATED TO VERIFY   RBC 3.53 (L) 4.22 - 5.81 MIL/uL   Hemoglobin 7.5 (L) 13.0 - 17.0 g/dL    Comment: REPEATED TO VERIFY   HCT 25.4 (L) 39.0 - 52.0 %   MCV 72.0 (L) 78.0 - 100.0 fL   MCH 21.2 (L) 26.0 - 34.0 pg   MCHC 29.5 (L) 30.0 - 36.0 g/dL   RDW 17.2 (H) 11.5 - 15.5 %   Platelets 142 (L) 150 - 400 K/uL    Comment: REPEATED TO VERIFY Performed at [facility], [address] [place], [place] [address]   Creatinine, serum     Status: Abnormal   Collection Time: 08/15/17  5:27 AM  Result Value Ref Range   Creatinine, Ser 1.12 0.61 - 1.24 mg/dL   GFR calc non Af Amer 56 (L) >60 mL/min   GFR calc Af Amer >60 >60 mL/min    Comment: (NOTE) The eGFR has been calculated using the CKD EPI equation. This calculation has not been validated in all clinical situations. eGFR's persistently <60 mL/min signify possible Chronic Kidney Disease. Performed at [facility], [place] [address], [place],  [address]   Surgical  PCR screen     Status: None   Collection Time: 08/15/17  7:48 AM  Result Value Ref Range   MRSA, PCR NEGATIVE NEGATIVE   Staphylococcus aureus NEGATIVE NEGATIVE    Comment: (NOTE) The Xpert SA Assay (FDA approved for NASAL specimens in patients 51 years of age and older), is one component of a comprehensive surveillance program. It is not intended to diagnose infection nor to guide or monitor treatment. Performed at [facility], [place] [address], [place], [place] [address]   Prepare RBC     Status: None   Collection Time: 08/15/17  1:05  PM  Result Value Ref Range   Order Confirmation      ORDER PROCESSED BY BLOOD BANK Performed at [facility], [place] [address], [place], [place] [address]   Prepare RBC     Status: None   Collection Time: 08/15/17  1:05 PM  Result Value Ref Range   Order Confirmation      ORDER PROCESSED BY BLOOD BANK Performed at [facility], [place] [address], [place], [place] [address]     Dg Tibia/fibula Right  Result Date: 08/15/2017 CLINICAL DATA:  External fixation of right ankle with irrigation debridement and application of wound VAC. EXAM: RIGHT TIBIA AND FIBULA - 2 VIEW COMPARISON:  Preoperative radiographs from 08/14/2017 FINDINGS: Five C-arm fluoroscopic views demonstrate external fixation across an open fracture of the distal tibial and fibular diaphysis. Fine bony detail is somewhat limited due to the fluoroscopic technique. A total of 9 seconds of fluoroscopic time was utilized. IMPRESSION: C-arm fluoroscopic views demonstrates placement of an external fixation device across a known comminuted open fracture of the distal tibia and fibula. Electronically Signed   By: Ashley Royalty M.D.   On: 08/15/2017 01:46   Dg Chest Portable 1 View  Result Date: 08/14/2017 CLINICAL DATA:  Struck by lawnmower. EXAM: PORTABLE CHEST 1 VIEW COMPARISON:  Chest radiograph January 22, 2017 FINDINGS: Cardiac silhouette is mildly enlarged, unchanged. Calcified aortic arch, potential ectasia. Status post cardiac valve replacement. Mild bibasilar strandy densities without pleural effusion or focal consolidation. No pneumothorax. Soft tissue planes and included osseous structures are non sub suspicious.Old LEFT rib fracture. IMPRESSION: Bibasilar atelectasis/scarring. Stable cardiomegaly. Aortic Atherosclerosis (ICD10-I70.0). Electronically Signed   By: Elon Alas M.D.   On: 08/14/2017 19:15   Dg Tibia/fibula Right Port  Result Date: 08/14/2017 CLINICAL DATA:  84 year old male status post trauma from  spinning lung lower blades. EXAM: PORTABLE RIGHT TIBIA AND FIBULA - 2 VIEW COMPARISON:  None. FINDINGS: AP and cross-table lateral views. Severe oblique penetrating trauma through the right tibia and fibula meta diaphyses. Severely comminuted fractures with numerous small bone fragments scattered in the adjacent soft tissues. Severe associated soft tissue injury. Oblique superior to inferior and anterior to posterior trajectory. Mild lateral displacement and medial angulation of the distal fragments. Fracture of the tibia extends toward distal tibia fibular syndesmosis, but appears to stop short of the mortise joint. Right mortise joint remains normally aligned. Talar dome, and malleoli intact. Calcaneus intact. Superimposed calcified peripheral vascular disease. The mid and proximal right tibia and fibula are intact. Maintained normal alignment at the right knee. Visible bones of the right foot appear intact. IMPRESSION: 1. Severe penetrating trauma through the right tibia and fibula at an oblique angle with severely comminuted fractures of both bones and numerous small bone fragments. 2. Distal tibia fracture likely extends into the distal tibia-fibular syndesmosis, but appears to spare the mortise  joint. Electronically Signed   By: Genevie Ann M.D.   On: 08/14/2017 19:21   Dg C-arm 1-60 Min-no Report  Result Date: 08/15/2017 Fluoroscopy was utilized by the requesting physician.  No radiographic interpretation.    Review of Systems  Constitutional: Negative for chills and fever.  Respiratory: Negative for shortness of breath and wheezing.   Cardiovascular: Negative for chest pain and palpitations.  Gastrointestinal: Negative for abdominal pain, nausea and vomiting.  Neurological: Positive for sensory change (numbness plantar aspect of R foot ).   Blood pressure 102/72, pulse 92, temperature 98.8 F (37.1 C), temperature source Oral, resp. rate 16, height '6\' 2"'$  (1.88 m), weight 77.1 kg (170 lb), SpO2 100  %.  Physical Exam  Constitutional: He is oriented to person, place, and time. Vital signs are normal. He appears well-developed and well-nourished. He is cooperative.  Very pleasant 84 year old male, no acute distress, sitting upright in bed, eating lunch  Eyes:  Abnormal right eye  Neck: Normal range of motion and full passive range of motion without pain. No spinous process tenderness and no muscular tenderness present.  Cardiovascular:  S1 and S2, regular rate and rhythm  Pulmonary/Chest: No accessory muscle usage. No respiratory distress.  Clear to auscultation bilateral anterior fields  Abdominal:  Soft, NTND, + BS   Musculoskeletal:  Pelvis      no traumatic wounds or rash, no ecchymosis, stable to manual stress, nontender  Right Lower Extremity  Inspection:   Delta frame ex fix to R ankle    Dressing stable to R ankle/lower leg   Knee, hip w/o deformities or acute findings Bony eval:   Hip and knee are nontender   No pain with axial loading or log rolling of R hip    Foot is nontender   Distal tibia and fibula are nontender     Soft tissue:   No open wounds or lesions to R hip or knee   VAC covering wound to R lower leg, clamped off    Did not remove dressings to evaluate soft tissues of lower leg/ankle at this time    Foot is stable     Swelling is moderate to R leg  Sensation:    DPN, SPN sensation intact    No TN sensation   Motor:   EHL and lesser toe extension intact   No appreciable toe flexion   Vascular:   Ext is warm    Skin with good color    I did part dressing over the foot to eval DP pulse.    + DP pulse noted    Compartments are soft, no pain with passive stretching   Left Lower extremity              no open wounds or lesions, no swelling or ecchymosis   Nontender hip, knee, ankle and foot             No crepitus or gross motion noted with manipulation of the Left leg  No knee or ankle effusion             No pain with axial loading or  logrolling of the hip. Negative Stinchfield test   Knee stable to varus/ valgus and anterior/posterior stress             No pain with manipulation of the ankle or foot             No blocks to motion noted  Sens DPN, SPN, TN intact  Motor EHL, FHL, lesser toe motor, Ext, flex, evers 5/5  DP 2+, PT 2+, No significant edema             Compartments are soft and nontender, no pain with passive stretching  Bilateral upper extremities      shoulder, elbow, wrist, digits- no skin wounds, nontender, no instability, no blocks to motion  Sens  Ax/R/M/U intact  Mot   Ax/ R/ PIN/ M/ AIN/ U intact  Rad 2+      Neurological: He is alert and oriented to person, place, and time.  Psychiatric: He has a normal mood and affect. His speech is normal and behavior is normal.     Assessment/Plan:  84 year old active male with open right distal tibia and fibula fracture with tendon and nerve disruption of the posterior compartments s/p irrigation debridement and external fixation  -Complex open right distal tibia fracture with severe soft tissue and nerve injury s/p external fixation and irrigation debridement  Return to the OR tomorrow for repeat I&D  Difficult to say right now whether we will proceed with limb salvage or below-knee amputation  Evaluation in the OR tomorrow will give [place] a better sense of the soft tissue injury present and will enable [place] to make a better informed decision.  We will also discuss again with the family our findings intraoperatively.  I would not anticipate anything other than the irrigation debridement tomorrow possible placement of antibiotic spacer as well.  Additional surgery likely next week    Treatment options include antibiotic spacer with maintenance of external fixator followed by tibiotalar calcaneal fusion with a hindfoot fusion nail after 6 weeks or so due to injury to ankle and toe flexors as well as his tibial nerve.  This is all predicated on the soft tissue  flaps being viable.  Definitely concern with his nerve injury and not being able to protect himself should limb salvage be pursued.  He would almost have to treat his foot like that of a patient with diabetic neuropathy.   Limb salvage would likely require at least 2 additional surgeries not including tomorrow.  And given the magnitude of bone loss patient would require reamed intramedullary aspirate harvest in addition to other graft sources to fill his void.   Alternatively we could proceed with below-knee amputation.  This option would likely allow the patient to get back to full activity quicker although it would be a significant physiologic hit but likely not more so than going to the OR multiple times for limb salvage.  - Pain management:  Continue with current regimen  - ABL anemia/Hemodynamics  2 units of packed red cells today in preparation for surgery tomorrow  - Medical issues   Per medical service   Plavix and aspirin are on hold   Will start Lovenox tomorrow postoperatively  - DVT/PE prophylaxis:  SCDs  As above  - ID:   Scheduled antibiotics for open fracture.  Will adjust orders to continue his antibiotic regimen.  Will continue on Ancef for now.  Patient did get gentamicin as well yesterday  - FEN/GI prophylaxis/Foley/Lines:  Npo after midnight  - Impediments to fracture healing:  Open fracture, bone loss, severe soft tissue injury  - Dispo:  OR tomorrow for repeat irrigation debridement   I did speak to the patient's son, Ludwig Clarks, over the telephone to review plan for tomorrow.  Seems to be very understanding and appreciative.  Jari Pigg, PA-C [facility] [phone] (  O) 08/15/2017, 1:43 PM

## 2017-08-16 ENCOUNTER — Inpatient Hospital Stay (HOSPITAL_COMMUNITY): Payer: Medicare HMO | Admitting: Critical Care Medicine

## 2017-08-16 ENCOUNTER — Encounter (HOSPITAL_COMMUNITY)
Admission: EM | Disposition: A | Discharge status: Skilled Nursing Facility | Payer: Self-pay | Source: Home / Self Care | Attending: Orthopedic Surgery

## 2017-08-16 ENCOUNTER — Encounter (HOSPITAL_COMMUNITY): Payer: Self-pay | Admitting: Critical Care Medicine

## 2017-08-16 HISTORY — PX: I & D EXTREMITY: SHX5045

## 2017-08-16 LAB — CBC
HEMATOCRIT: 27.8 % — AB (ref 39.0–52.0)
HEMOGLOBIN: 8.6 g/dL — AB (ref 13.0–17.0)
MCH: 23.4 pg — AB (ref 26.0–34.0)
MCHC: 30.9 g/dL (ref 30.0–36.0)
MCV: 75.7 fL — ABNORMAL LOW (ref 78.0–100.0)
Platelets: 123 10*3/uL — ABNORMAL LOW (ref 150–400)
RBC: 3.67 MIL/uL — ABNORMAL LOW (ref 4.22–5.81)
RDW: 19.5 % — ABNORMAL HIGH (ref 11.5–15.5)
WBC: 12.9 10*3/uL — ABNORMAL HIGH (ref 4.0–10.5)

## 2017-08-16 LAB — COMPREHENSIVE METABOLIC PANEL
ALBUMIN: 3.1 g/dL — AB (ref 3.5–5.0)
ALK PHOS: 49 U/L (ref 38–126)
ALT: 13 U/L — AB (ref 17–63)
AST: 21 U/L (ref 15–41)
Anion gap: 5 (ref 5–15)
BILIRUBIN TOTAL: 0.5 mg/dL (ref 0.3–1.2)
BUN: 14 mg/dL (ref 6–20)
CALCIUM: 7.9 mg/dL — AB (ref 8.9–10.3)
CO2: 24 mmol/L (ref 22–32)
CREATININE: 1.08 mg/dL (ref 0.61–1.24)
Chloride: 108 mmol/L (ref 101–111)
GFR calc Af Amer: >60 mL/min (ref >60)
GFR calc non Af Amer: 58 mL/min — ABNORMAL LOW (ref >60)
GLUCOSE: 126 mg/dL — AB (ref 65–99)
Potassium: 3.9 mmol/L (ref 3.5–5.1)
Sodium: 137 mmol/L (ref 135–145)
TOTAL PROTEIN: 5.7 g/dL — AB (ref 6.5–8.1)

## 2017-08-16 LAB — LACTIC ACID, PLASMA: Lactic Acid, Venous: 1.3 mmol/L (ref 0.5–1.9)

## 2017-08-16 LAB — PROTIME-INR
INR: 1.25
Prothrombin Time: 15.6 seconds — ABNORMAL HIGH (ref 11.4–15.2)

## 2017-08-16 LAB — PREPARE RBC (CROSSMATCH)

## 2017-08-16 LAB — APTT: APTT: 39 s — AB (ref 24–36)

## 2017-08-16 SURGERY — IRRIGATION AND DEBRIDEMENT EXTREMITY
Anesthesia: General | Laterality: Right

## 2017-08-16 MED ORDER — ROCURONIUM BROMIDE 100 MG/10ML IV SOLN
INTRAVENOUS | Status: DC | PRN
  Administered 2017-08-16: 50 mg via INTRAVENOUS

## 2017-08-16 MED ORDER — PHENYLEPHRINE HCL 10 MG/ML IJ SOLN
INTRAVENOUS | Status: DC | PRN
  Administered 2017-08-16: 50 ug/min via INTRAVENOUS

## 2017-08-16 MED ORDER — DEXAMETHASONE SODIUM PHOSPHATE 10 MG/ML IJ SOLN
INTRAMUSCULAR | Status: DC | PRN
  Administered 2017-08-16: 4 mg via INTRAVENOUS

## 2017-08-16 MED ORDER — PROPOFOL 10 MG/ML IV BOLUS
INTRAVENOUS | Status: DC | PRN
  Administered 2017-08-16: 70 mg via INTRAVENOUS

## 2017-08-16 MED ORDER — FENTANYL CITRATE (PF) 250 MCG/5ML IJ SOLN
INTRAMUSCULAR | Status: DC | PRN
  Administered 2017-08-16: 50 ug via INTRAVENOUS

## 2017-08-16 MED ORDER — FENTANYL CITRATE (PF) 250 MCG/5ML IJ SOLN
INTRAMUSCULAR | Status: AC
  Filled 2017-08-16: qty 5

## 2017-08-16 MED ORDER — ONDANSETRON HCL 4 MG/2ML IJ SOLN
INTRAMUSCULAR | Status: DC | PRN
  Administered 2017-08-16: 4 mg via INTRAVENOUS

## 2017-08-16 MED ORDER — PROPOFOL 10 MG/ML IV BOLUS
INTRAVENOUS | Status: AC
  Filled 2017-08-16: qty 20

## 2017-08-16 MED ORDER — PHENYLEPHRINE 40 MCG/ML (10ML) SYRINGE FOR IV PUSH (FOR BLOOD PRESSURE SUPPORT)
PREFILLED_SYRINGE | INTRAVENOUS | Status: DC | PRN
  Administered 2017-08-16: 80 ug via INTRAVENOUS
  Administered 2017-08-16: 40 ug via INTRAVENOUS

## 2017-08-16 MED ORDER — LIDOCAINE 2% (20 MG/ML) 5 ML SYRINGE
INTRAMUSCULAR | Status: DC | PRN
  Administered 2017-08-16: 60 mg via INTRAVENOUS

## 2017-08-16 MED ORDER — LACTATED RINGERS IV SOLN
INTRAVENOUS | Status: DC | PRN
  Administered 2017-08-16: 09:00:00 via INTRAVENOUS

## 2017-08-16 MED ORDER — SUGAMMADEX SODIUM 200 MG/2ML IV SOLN
INTRAVENOUS | Status: DC | PRN
  Administered 2017-08-16: 160 mg via INTRAVENOUS

## 2017-08-16 MED ORDER — SODIUM CHLORIDE 0.9 % IR SOLN
Status: DC | PRN
  Administered 2017-08-16 (×2): 3000 mL

## 2017-08-16 MED ORDER — SODIUM CHLORIDE 0.9 % IV SOLN
INTRAVENOUS | Status: DC | PRN
  Administered 2017-08-16: 09:00:00 via INTRAVENOUS

## 2017-08-16 MED ORDER — LACTATED RINGERS IV SOLN
INTRAVENOUS | Status: DC | PRN
  Administered 2017-08-16: 08:00:00 via INTRAVENOUS

## 2017-08-16 MED ORDER — ENOXAPARIN SODIUM 30 MG/0.3ML ~~LOC~~ SOLN
30.0000 mg | SUBCUTANEOUS | Status: AC
  Administered 2017-08-17 – 2017-08-18 (×2): 30 mg via SUBCUTANEOUS
  Filled 2017-08-16 (×2): qty 0.3

## 2017-08-16 MED ORDER — PROMETHAZINE HCL 25 MG/ML IJ SOLN: 6.2500 mg | INTRAMUSCULAR | Status: DC | PRN

## 2017-08-16 MED ORDER — 0.9 % SODIUM CHLORIDE (POUR BTL) OPTIME
TOPICAL | Status: DC | PRN
  Administered 2017-08-16: 1000 mL

## 2017-08-16 MED ORDER — CEFAZOLIN SODIUM-DEXTROSE 1-4 GM/50ML-% IV SOLN
1.0000 g | Freq: Three times a day (TID) | INTRAVENOUS | Status: DC
  Administered 2017-08-16 – 2017-08-19 (×10): 1 g via INTRAVENOUS
  Filled 2017-08-16 (×13): qty 50

## 2017-08-16 MED ORDER — HYDROMORPHONE HCL 1 MG/ML IJ SOLN: 0.2500 mg | INTRAMUSCULAR | Status: DC | PRN

## 2017-08-16 MED ORDER — CEFAZOLIN SODIUM-DEXTROSE 1-4 GM/50ML-% IV SOLN
INTRAVENOUS | Status: DC | PRN
  Administered 2017-08-16: 1 g via INTRAVENOUS

## 2017-08-16 SURGICAL SUPPLY — 77 items
BANDAGE ACE 4X5 VEL STRL LF (GAUZE/BANDAGES/DRESSINGS) ×3 IMPLANT
BANDAGE ACE 6X5 VEL STRL LF (GAUZE/BANDAGES/DRESSINGS) ×6 IMPLANT
BANDAGE ESMARK 6X9 LF (GAUZE/BANDAGES/DRESSINGS) IMPLANT
BLADE SAW SAG 73X25 THK (BLADE) ×2
BLADE SAW SGTL 73X25 THK (BLADE) ×1 IMPLANT
BLADE SURG 10 STRL SS (BLADE) ×3 IMPLANT
BNDG COHESIVE 4X5 TAN STRL (GAUZE/BANDAGES/DRESSINGS) ×3 IMPLANT
BNDG ESMARK 6X9 LF (GAUZE/BANDAGES/DRESSINGS)
BNDG GAUZE ELAST 4 BULKY (GAUZE/BANDAGES/DRESSINGS) ×3 IMPLANT
BNDG GAUZE STRTCH 6 (GAUZE/BANDAGES/DRESSINGS) ×9 IMPLANT
BRUSH SCRUB SURG 4.25 DISP (MISCELLANEOUS) ×6 IMPLANT
COVER MAYO STAND STRL (DRAPES) ×3 IMPLANT
COVER SURGICAL LIGHT HANDLE (MISCELLANEOUS) ×6 IMPLANT
CUFF TOURNIQUET SINGLE 34IN LL (TOURNIQUET CUFF) ×3 IMPLANT
DRAIN PENROSE 1/2X12 LTX STRL (WOUND CARE) IMPLANT
DRAPE EXTREMITY T 121X128X90 (DRAPE) ×3 IMPLANT
DRAPE HALF SHEET 40X57 (DRAPES) ×9 IMPLANT
DRAPE U-SHAPE 47X51 STRL (DRAPES) ×3 IMPLANT
DRSG ADAPTIC 3X8 NADH LF (GAUZE/BANDAGES/DRESSINGS) ×3 IMPLANT
DRSG MEPITEL 4X7.2 (GAUZE/BANDAGES/DRESSINGS) ×3 IMPLANT
DRSG PAD ABDOMINAL 8X10 ST (GAUZE/BANDAGES/DRESSINGS) ×3 IMPLANT
DRSG VAC ATS MED SENSATRAC (GAUZE/BANDAGES/DRESSINGS) ×3 IMPLANT
DRSG VERSA FOAM LRG 10X15 (GAUZE/BANDAGES/DRESSINGS) ×3 IMPLANT
ELECT REM PT RETURN 9FT ADLT (ELECTROSURGICAL)
ELECTRODE REM PT RTRN 9FT ADLT (ELECTROSURGICAL) IMPLANT
EVACUATOR 1/8 PVC DRAIN (DRAIN) IMPLANT
GAUZE SPONGE 4X4 12PLY STRL (GAUZE/BANDAGES/DRESSINGS) ×3 IMPLANT
GLOVE BIO SURGEON STRL SZ7.5 (GLOVE) ×3 IMPLANT
GLOVE BIO SURGEON STRL SZ8 (GLOVE) ×3 IMPLANT
GLOVE BIOGEL PI IND STRL 7.5 (GLOVE) ×2 IMPLANT
GLOVE BIOGEL PI IND STRL 8 (GLOVE) ×1 IMPLANT
GLOVE BIOGEL PI INDICATOR 7.5 (GLOVE) ×4
GLOVE BIOGEL PI INDICATOR 8 (GLOVE) ×2
GOWN STRL REUS W/ TWL LRG LVL3 (GOWN DISPOSABLE) ×2 IMPLANT
GOWN STRL REUS W/ TWL XL LVL3 (GOWN DISPOSABLE) ×1 IMPLANT
GOWN STRL REUS W/TWL LRG LVL3 (GOWN DISPOSABLE) ×4
GOWN STRL REUS W/TWL XL LVL3 (GOWN DISPOSABLE) ×2
HANDPIECE INTERPULSE COAX TIP (DISPOSABLE)
KIT BASIN OR (CUSTOM PROCEDURE TRAY) ×3 IMPLANT
KIT TURNOVER KIT B (KITS) ×3 IMPLANT
MANIFOLD NEPTUNE II (INSTRUMENTS) ×3 IMPLANT
NS IRRIG 1000ML POUR BTL (IV SOLUTION) ×3 IMPLANT
PACK GENERAL/GYN (CUSTOM PROCEDURE TRAY) ×3 IMPLANT
PACK ORTHO EXTREMITY (CUSTOM PROCEDURE TRAY) ×3 IMPLANT
PAD ABD 8X10 STRL (GAUZE/BANDAGES/DRESSINGS) ×3 IMPLANT
PAD ARMBOARD 7.5X6 YLW CONV (MISCELLANEOUS) ×6 IMPLANT
PAD CAST 4YDX4 CTTN HI CHSV (CAST SUPPLIES) ×1 IMPLANT
PAD NEG PRESSURE SENSATRAC (MISCELLANEOUS) ×3 IMPLANT
PADDING CAST COTTON 4X4 STRL (CAST SUPPLIES) ×2
PADDING CAST COTTON 6X4 STRL (CAST SUPPLIES) ×3 IMPLANT
SET HNDPC FAN SPRY TIP SCT (DISPOSABLE) IMPLANT
SPONGE LAP 18X18 X RAY DECT (DISPOSABLE) ×3 IMPLANT
STAPLER VISISTAT 35W (STAPLE) IMPLANT
STOCKINETTE IMPERVIOUS 9X36 MD (GAUZE/BANDAGES/DRESSINGS) ×3 IMPLANT
STOCKINETTE IMPERVIOUS LG (DRAPES) ×3 IMPLANT
SUT ETHILON 2 0 FS 18 (SUTURE) IMPLANT
SUT PDS AB 2-0 CT1 27 (SUTURE) IMPLANT
SUT PROLENE 0 CT 1 30 (SUTURE) ×3 IMPLANT
SUT PROLENE 2 0 CT 1 (SUTURE) ×3 IMPLANT
SUT SILK 2 0 (SUTURE)
SUT SILK 2 0 SH CR/8 (SUTURE) ×3 IMPLANT
SUT SILK 2-0 18XBRD TIE 12 (SUTURE) IMPLANT
SUT VIC AB 0 CT1 27 (SUTURE) ×4
SUT VIC AB 0 CT1 27XBRD ANBCTR (SUTURE) ×2 IMPLANT
SUT VIC AB 1 CT1 27 (SUTURE) ×4
SUT VIC AB 1 CT1 27XBRD ANBCTR (SUTURE) ×2 IMPLANT
SUT VIC AB 2-0 CT1 27 (SUTURE) ×4
SUT VIC AB 2-0 CT1 TAPERPNT 27 (SUTURE) ×2 IMPLANT
SWAB CULTURE ESWAB REG 1ML (MISCELLANEOUS) IMPLANT
TOWEL OR 17X24 6PK STRL BLUE (TOWEL DISPOSABLE) ×3 IMPLANT
TOWEL OR 17X26 10 PK STRL BLUE (TOWEL DISPOSABLE) ×6 IMPLANT
TUBE CONNECTING 12'X1/4 (SUCTIONS) ×1
TUBE CONNECTING 12X1/4 (SUCTIONS) ×2 IMPLANT
TUBING CYSTO DISP (UROLOGICAL SUPPLIES) ×3 IMPLANT
UNDERPAD 30X30 (UNDERPADS AND DIAPERS) ×3 IMPLANT
WATER STERILE IRR 1000ML POUR (IV SOLUTION) ×3 IMPLANT
YANKAUER SUCT BULB TIP NO VENT (SUCTIONS) ×3 IMPLANT

## 2017-08-16 NOTE — Progress Notes (Signed)
No changes overnight. Plan the same for debridement and spacer with possible amputation if purulence. Patient and son consent to proceed.  Charles GalasMichael Nathalie Cavendish, MD [facility], [facility] [phone]

## 2017-08-16 NOTE — Progress Notes (Signed)
PROGRESS NOTE                                                                                                                                                                                                             Patient Demographics:    Charles Patrick, is a 84 y.o. male, DOB - 11-07-1924, ZOX:[id number]  Admit date - 08/14/2017   Admitting Physician Toni Arthurs, MD  Outpatient Primary MD for the patient is Elise Benne [name], PA-C  LOS - 1   Chief Complaint  Patient presents with  . Extremity Laceration    Cut by Lawnmower       Brief Narrative   84 year old male with history of severe aortic stenosis status post TAVR last November, hyperlipidemia, BPH, chronic anemia, hypothyroidism had injured as right lower extremity with lawnmower .  Patient has significant bleed and x-rays done in the ER revealed fracture of the tibia.  Went for surgical repair by Dr. Cecilie Kicks with 08/15/2017, and were consulted for medical management   Subjective:    Charles Patrick today has, No headache, No chest pain, No abdominal pain -right lower extremity pain is controlled   Assessment  & Plan :    Active Problems:   HTN (hypertension)   HLD (hyperlipidemia)   Hypothyroidism   S/P TAVR (transcatheter aortic valve replacement)   Open right ankle fracture   Right lower extremity injury with tibial and fibula fracture -Treatment per primary orthopedic team, status post irrigation and excisional debridement, with open reduction of tibia and fibula fracture and application of multiplane external fixator and wound VAC. -DVT prophylaxis and pain management per orthopedic team.  Status post TAVR  -Appears to be euvolemic, continue to monitor closely  Hypothyroidism  -Resume home dose Synthroid  Anemia -Neck microcytic anemia, as well acute blood loss anemia postoperatively, was transfused 1 unit PRBC for hemoglobin of 7.5, with good response, continue to monitor  closely and transfuse for hemoglobin less than 8  BPH  -Continue with tamsulosin  Hyperlipidemia  -Continue home dose statin  Resume aspirin and Plavix once cleared by Ortho     Code Status : Full  Family Communication  : none at bedside  Procedures  :  By Dr. Beather Arbour on 08/15/2017 1.  Irrigation and excisional debridement of open right tibia and fibula fractures 2.  Complex  closure of right leg wounds totaling 25 cm in length 3.  Open reduction of tibia and fibula fractures 4.  Application of multiplane external fixator 5.  Application of wound VAC    DVT Prophylaxis  : Per Primary orthopedic team  Lab Results  Component Value Date   PLT 123 (L) 08/16/2017    Antibiotics  :    Anti-infectives (From admission, onward)   Start     Dose/Rate Route Frequency Ordered Stop   08/16/17 1630  ceFAZolin (ANCEF) IVPB 1 g/50 mL premix     1 g 100 mL/hr over 30 Minutes Intravenous Every 8 hours 08/16/17 1110 08/20/17 1359   08/16/17 1600  gentamicin (GARAMYCIN) 520 mg in dextrose 5 % 100 mL IVPB     520 mg 113 mL/hr over 60 Minutes Intravenous Every 36 hours 08/15/17 1623     08/15/17 2200  ceFAZolin (ANCEF) IVPB 1 g/50 mL premix  Status:  Discontinued     1 g 100 mL/hr over 30 Minutes Intravenous Every 8 hours 08/15/17 1418 08/16/17 1110   08/15/17 0400  ceFAZolin (ANCEF) IVPB 1 g/50 mL premix  Status:  Discontinued     1 g 100 mL/hr over 30 Minutes Intravenous Every 6 hours 08/15/17 0226 08/15/17 1418   08/15/17 0245  gentamicin (GARAMYCIN) 520 mg in dextrose 5 % 100 mL IVPB     520 mg 113 mL/hr over 60 Minutes Intravenous  Once 08/15/17 0231 08/15/17 0356   08/14/17 1845  ceFAZolin (ANCEF) IVPB 1 g/50 mL premix     1 g 100 mL/hr over 30 Minutes Intravenous  Once 08/14/17 1840 08/14/17 2034        Objective:   Vitals:   08/16/17 1050 08/16/17 1054 08/16/17 1109 08/16/17 1115  BP:   (!) 155/99   Pulse:  80 78   Resp:  19 18   Temp: 97.6 F (36.4 C)  98.1 F  (36.7 C)   TempSrc:   Oral   SpO2:  92% (!) 89% 94%  Weight:      Height:        Wt Readings from Last 3 Encounters:  08/14/17 77.1 kg (170 lb)  02/13/17 78.7 kg (173 lb 8 oz)  01/30/17 79.4 kg (175 lb)     Intake/Output Summary (Last 24 hours) at 08/16/2017 1352 Last data filed at 08/16/2017 1113 Gross per 24 hour  Intake 3992.38 ml  Output 635 ml  Net 3357.38 ml     Physical Exam  Awake Alert, Oriented X 3, No new F.N deficits, Normal affect Symmetrical Chest wall movement, Good air movement bilaterally, CTAB RRR,No Gallops,Rubs or new Murmurs, No Parasternal Heave +ve B.Sounds, Abd Soft, No tenderness,  No rebound - guarding or rigidity. Right lower extremity bandaged, with external fixation device applied, with wound VAC as well.    Data Review:    CBC Recent Labs  Lab 08/14/17 1843 08/14/17 1900 08/15/17 0527 08/16/17 0506  WBC 12.1*  --  13.6* 12.9*  HGB 10.2* 10.9* 7.5* 8.6*  HCT 34.2* 32.0* 25.4* 27.8*  PLT 206  --  142* 123*  MCV 71.5*  --  72.0* 75.7*  MCH 21.3*  --  21.2* 23.4*  MCHC 29.8*  --  29.5* 30.9  RDW 17.2*  --  17.2* 19.5*  LYMPHSABS 4.1*  --   --   --   MONOABS 0.8  --   --   --   EOSABS 0.3  --   --   --  BASOSABS 0.1  --   --   --     Chemistries  Recent Labs  Lab 08/14/17 1900 08/15/17 0527 08/16/17 0506  NA 143  --  137  K 3.6  --  3.9  CL 107  --  108  CO2  --   --  24  GLUCOSE 151*  --  126*  BUN 15  --  14  CREATININE 1.10 1.12 1.08  CALCIUM  --   --  7.9*  AST  --   --  21  ALT  --   --  13*  ALKPHOS  --   --  49  BILITOT  --   --  0.5   ------------------------------------------------------------------------------------------------------------------ No results for input(s): CHOL, HDL, LDLCALC, TRIG, CHOLHDL, LDLDIRECT in the last 72 hours.  Lab Results  Component Value Date   HGBA1C 5.6 01/18/2017    ------------------------------------------------------------------------------------------------------------------ No results for input(s): TSH, T4TOTAL, T3FREE, THYROIDAB in the last 72 hours.  Invalid input(s): FREET3 ------------------------------------------------------------------------------------------------------------------ No results for input(s): VITAMINB12, FOLATE, FERRITIN, TIBC, IRON, RETICCTPCT in the last 72 hours.  Coagulation profile Recent Labs  Lab 08/16/17 0506  INR 1.25    No results for input(s): DDIMER in the last 72 hours.  Cardiac Enzymes No results for input(s): CKMB, TROPONINI, MYOGLOBIN in the last 168 hours.  Invalid input(s): CK ------------------------------------------------------------------------------------------------------------------    Component Value Date/Time   BNP 109.0 (H) 01/18/2017 1131    Inpatient Medications  Scheduled Meds: . acetaminophen  1,000 mg Oral Q8H  . docusate sodium  100 mg Oral BID  . levothyroxine  50 mcg Oral QAC breakfast  . mupirocin ointment  1 application Nasal BID  . senna  1 tablet Oral BID  . simvastatin  40 mg Oral q1800  . tamsulosin  0.4 mg Oral QPC supper   Continuous Infusions: . sodium chloride 75 mL/hr at 08/16/17 0137  .  ceFAZolin (ANCEF) IV    . gentamicin    . methocarbamol (ROBAXIN)  IV     PRN Meds:.diphenhydrAMINE, HYDROmorphone (DILAUDID) injection, methocarbamol **OR** methocarbamol (ROBAXIN)  IV, ondansetron **OR** ondansetron (ZOFRAN) IV, oxyCODONE, oxyCODONE  Micro Results Recent Results (from the past 240 hour(s))  Surgical PCR screen     Status: None   Collection Time: 08/15/17  7:48 AM  Result Value Ref Range Status   MRSA, PCR NEGATIVE NEGATIVE Final   Staphylococcus aureus NEGATIVE NEGATIVE Final    Comment: (NOTE) The Xpert SA Assay (FDA approved for NASAL specimens in patients 61 years of age and older), is one component of a comprehensive surveillance program.  It is not intended to diagnose infection nor to guide or monitor treatment. Performed at [facility], [address] [place], [place] [address]     Radiology Reports Dg Tibia/fibula Right  Result Date: 08/15/2017 CLINICAL DATA:  External fixation of right ankle with irrigation debridement and application of wound VAC. EXAM: RIGHT TIBIA AND FIBULA - 2 VIEW COMPARISON:  Preoperative radiographs from 08/14/2017 FINDINGS: Five C-arm fluoroscopic views demonstrate external fixation across an open fracture of the distal tibial and fibular diaphysis. Fine bony detail is somewhat limited due to the fluoroscopic technique. A total of 9 seconds of fluoroscopic time was utilized. IMPRESSION: C-arm fluoroscopic views demonstrates placement of an external fixation device across a known comminuted open fracture of the distal tibia and fibula. Electronically Signed   By: Tollie Eth M.D.   On: 08/15/2017 01:46   Ct Ankle Right Wo Contrast  Result Date:  08/16/2017 CLINICAL DATA:  Right ankle fracture post external fixation. EXAM: CT OF THE RIGHT ANKLE WITHOUT CONTRAST TECHNIQUE: Multidetector CT imaging of the right ankle was performed according to the standard protocol. Multiplanar CT image reconstructions were also generated. COMPARISON:  Radiographs 08/14/2016. FINDINGS: Bones/Joint/Cartilage The ankle is splinted. External fixator passes through the calcaneal tuberosity. The proximal fixator is not visualized. There is a comminuted and moderately displaced fracture of the distal tibial metadiaphysis. Proximally, there is an oblique fracture extending 10 cm proximal to the tibial plafond which is associated with 1.4 cm of residual lateral displacement and approximately 1.7 cm of proximal displacement. There is approximately 20 degrees of apex posterior angulation at this fracture. There is a large butterfly fragment posteriorly, measuring up to 2.5 cm on sagittal image 48/8. Fractures extend into the  posterolateral aspect of the tibial plafond. There is no displacement of the articular surface. Comminuted fracture of the distal fibular diaphysis demonstrates near anatomic reduction, although there is approximately 18 degrees of residual apex posterior angulation on the sagittal images. This fracture does not extend into the distal tibiofibular joint. There is no widening of that joint. The ankle mortise is intact. The talus is located and appears intact. There is a small amount of gas lateral to the subtalar joint without definite associated fracture. The additional tarsal bones appear intact. Ligaments Suboptimally assessed by CT. Muscles and Tendons The Achilles tendon appears ruptured approximately 9.7 cm proximal to its calcaneal insertion. There is an approximately 3.6 cm gap in the tendon on sagittal image 36/10. Within the tendon gap, there is a complex fluid collection with multiple air bubbles, measuring up to 3.8 x 1.7 cm on axial image 26/4. There are multiple fracture fragments deep to the disrupted tendon. There are surgical staples along the retracted portions of the tendon. The medial flexor tendons also appear displaced into the fracture and there integrity is difficult to confirm. They are intact distally and normally located. The peroneal tendons appear intact, although are slightly subluxed laterally from the retromalleolar groove. The anterior extensor tendons are intact. Soft tissues Extensive soft tissue injury consistent with an open fracture. There are multiple air bubbles throughout the soft tissues, with a complex air-fluid collection posterior to the distal tibia, between the retracted portions of the ruptured Achilles tendon. No other focal fluid collections are seen. There is subcutaneous edema throughout the lower leg. IMPRESSION: 1. Comminuted fracture of the distal tibia demonstrates mild residual displacement and angulation. Fracture extends into the posterolateral aspect of the  tibial plafond, although the intra-articular portion is not displaced. 2. Near anatomic reduction of comminuted fracture of the distal fibular diaphysis. 3. No dislocation or tarsal bone injury identified. 4. Complete rupture of the Achilles tendon located at the level of the proximal fracture with associated moderate tendon retraction and a complex air-fluid collection between the retracted portions of the tendon. The medial flexor tendons are also displaced into the tibial fracture and their integrity is difficult to confirm. Electronically Signed   By: Carey Bullocks M.D.   On: 08/16/2017 08:04   Dg Chest Portable 1 View  Result Date: 08/14/2017 CLINICAL DATA:  Struck by lawnmower. EXAM: PORTABLE CHEST 1 VIEW COMPARISON:  Chest radiograph January 22, 2017 FINDINGS: Cardiac silhouette is mildly enlarged, unchanged. Calcified aortic arch, potential ectasia. Status post cardiac valve replacement. Mild bibasilar strandy densities without pleural effusion or focal consolidation. No pneumothorax. Soft tissue planes and included osseous structures are non sub suspicious.Old LEFT rib fracture. IMPRESSION: Bibasilar  atelectasis/scarring. Stable cardiomegaly. Aortic Atherosclerosis (ICD10-I70.0). Electronically Signed   By: Awilda Metro M.D.   On: 08/14/2017 19:15   Dg Tibia/fibula Right Port  Result Date: 08/14/2017 CLINICAL DATA:  84 year old male status post trauma from spinning lung lower blades. EXAM: PORTABLE RIGHT TIBIA AND FIBULA - 2 VIEW COMPARISON:  None. FINDINGS: AP and cross-table lateral views. Severe oblique penetrating trauma through the right tibia and fibula meta diaphyses. Severely comminuted fractures with numerous small bone fragments scattered in the adjacent soft tissues. Severe associated soft tissue injury. Oblique superior to inferior and anterior to posterior trajectory. Mild lateral displacement and medial angulation of the distal fragments. Fracture of the tibia extends toward  distal tibia fibular syndesmosis, but appears to stop short of the mortise joint. Right mortise joint remains normally aligned. Talar dome, and malleoli intact. Calcaneus intact. Superimposed calcified peripheral vascular disease. The mid and proximal right tibia and fibula are intact. Maintained normal alignment at the right knee. Visible bones of the right foot appear intact. IMPRESSION: 1. Severe penetrating trauma through the right tibia and fibula at an oblique angle with severely comminuted fractures of both bones and numerous small bone fragments. 2. Distal tibia fracture likely extends into the distal tibia-fibular syndesmosis, but appears to spare the mortise joint. Electronically Signed   By: Odessa Fleming M.D.   On: 08/14/2017 19:21   Dg C-arm 1-60 Min-no Report  Result Date: 08/15/2017 Fluoroscopy was utilized by the requesting physician.  No radiographic interpretation.     Huey Bienenstock M.D on 08/16/2017 at 1:52 PM  Between 7am to 7pm - Pager - [phone]  After 7pm go to [email or web address] - password [facility]  [facility] -  Office  [phone]

## 2017-08-16 NOTE — Transfer of Care (Signed)
Immediate Anesthesia Transfer of Care Note  Patient: Charles KaufmannDavid B Patrick  Procedure(s) Performed: IRRIGATION AND DEBRIDEMENT EXTREMITY (Right )  Patient Location: PACU  Anesthesia Type:General  Level of Consciousness: awake and alert   Airway & Oxygen Therapy: Patient Spontanous Breathing and Patient connected to nasal cannula oxygen  Post-op Assessment: Report given to RN and Post -op Vital signs reviewed and stable  Post vital signs: Reviewed and stable  Last Vitals:  Vitals Value Taken Time  BP 128/98 08/16/2017  9:46 AM  Temp    Pulse 72 08/16/2017  9:46 AM  Resp 8 08/16/2017  9:46 AM  SpO2 96 % 08/16/2017  9:46 AM  Vitals shown include unvalidated device data.  Last Pain:  Vitals:   08/16/17 0520  TempSrc: Oral  PainSc:          Complications: No apparent anesthesia complications

## 2017-08-16 NOTE — Progress Notes (Signed)
1100 Received pt from PACU, a little disoriented, easily reoriented. Right lower leg with external fixator intact, compression wrap dry and intact. Pt denies pain at this time, right toes cool to touch, good cap refill.

## 2017-08-16 NOTE — Anesthesia Procedure Notes (Signed)
Procedure Name: Intubation Date/Time: 08/16/2017 8:21 AM Performed by: Wilburn Cornelia, CRNA Pre-anesthesia Checklist: Patient identified, Emergency Drugs available, Suction available, Patient being monitored and Timeout performed Patient Re-evaluated:Patient Re-evaluated prior to induction Oxygen Delivery Method: Circle system utilized Preoxygenation: Pre-oxygenation with 100% oxygen Induction Type: IV induction Ventilation: Mask ventilation without difficulty and Oral airway inserted - appropriate to patient size Laryngoscope Size: Mac and 4 Grade View: Grade I Tube size: 7.5 mm Number of attempts: 1 Airway Equipment and Method: Stylet Placement Confirmation: ETT inserted through vocal cords under direct vision,  positive ETCO2,  CO2 detector and breath sounds checked- equal and bilateral Secured at: 23 cm Tube secured with: Tape Dental Injury: Teeth and Oropharynx as per pre-operative assessment

## 2017-08-16 NOTE — Anesthesia Preprocedure Evaluation (Addendum)
Anesthesia Evaluation  Patient identified by MRN, date of birth, ID band Patient awake    Reviewed: Allergy & Precautions, NPO status , Patient's Chart, lab work & pertinent test results  Airway Mallampati: II  TM Distance: >3 FB Neck ROM: Full    Dental no notable dental hx.    Pulmonary neg pulmonary ROS,    Pulmonary exam normal breath sounds clear to auscultation       Cardiovascular hypertension, + Peripheral Vascular Disease   Rhythm:Regular Rate:Normal + Systolic murmurs Left ventricle: The cavity size was normal. Wall thickness was   increased in a pattern of mild LVH. Systolic function was normal.   The estimated ejection fraction was in the range of 60% to 65%.   Wall motion was normal; there were no regional wall motion   abnormalities. Doppler parameters are consistent with abnormal   left ventricular relaxation (grade 1 diastolic dysfunction). The   E/e&' ratio is between 8-15, suggesting indeterminate LV filing   pressure. - Aortic valve: s/p 29 mm Sapien 3 TAVR. No obstruction. No   paravalvular leak. Mean gradient (S): 14 mm Hg. Peak gradient   (S): 26 mm Hg. Valve area (VTI): 1.95 cm^2. Valve area (Vmax):   1.86 cm^2. Valve area (Vmean): 1.9 cm^2. - Aorta: Ascending aortic diameter: 48 mm (S). - Ascending aorta: The ascending aorta was moderately dilated. - Mitral valve: Mildly thickened leaflets . There was trivial   regurgitation. - Left atrium: The atrium was normal in size. - Atrial septum: No defect or patent foramen ovale was identified. - Tricuspid valve: There was mild regurgitation. - Pulmonary arteries: PA peak pressure: 40 mm Hg (S). - Inferior vena cava: The vessel was normal in size. The   respirophasic diameter changes were in the normal range (>= 50%),   consistent with normal central venous pressure.  Impressions:  - Compared to a prior study in 12/2016, the LVEF is unchanged. The   TAVR  valve is stable. The ascending aorta measures up to 4.8 cm.    Neuro/Psych negative neurological ROS  negative psych ROS   GI/Hepatic negative GI ROS, Neg liver ROS,   Endo/Other  Hypothyroidism   Renal/GU negative Renal ROS  negative genitourinary   Musculoskeletal negative musculoskeletal ROS (+)   Abdominal   Peds negative pediatric ROS (+)  Hematology  (+) anemia ,   Anesthesia Other Findings   Reproductive/Obstetrics negative OB ROS                             Anesthesia Physical Anesthesia Plan  ASA: IV  Anesthesia Plan: General   Post-op Pain Management:    Induction: Intravenous  PONV Risk Score and Plan: 2 and Ondansetron, Dexamethasone and Treatment may vary due to age or medical condition  Airway Management Planned: Oral ETT  Additional Equipment:   Intra-op Plan:   Post-operative Plan: Extubation in OR  Informed Consent: I have reviewed the patients History and Physical, chart, labs and discussed the procedure including the risks, benefits and alternatives for the proposed anesthesia with the patient or authorized representative who has indicated his/her understanding and acceptance.   Dental advisory given  Plan Discussed with: CRNA and Surgeon  Anesthesia Plan Comments: (Transfuse intraop)       Anesthesia Quick Evaluation

## 2017-08-16 NOTE — Anesthesia Postprocedure Evaluation (Signed)
Anesthesia Post Note  Patient: Charles Patrick  Procedure(s) Performed: IRRIGATION AND DEBRIDEMENT EXTREMITY (Right )     Patient location during evaluation: PACU Anesthesia Type: General Level of consciousness: awake and alert Pain management: pain level controlled Vital Signs Assessment: post-procedure vital signs reviewed and stable Respiratory status: spontaneous breathing, nonlabored ventilation, respiratory function stable and patient connected to nasal cannula oxygen Cardiovascular status: blood pressure returned to baseline and stable Postop Assessment: no apparent nausea or vomiting Anesthetic complications: no    Last Vitals:  Vitals:   08/16/17 1054 08/16/17 1109  BP:  (!) 155/99  Pulse: 80 78  Resp: 19 18  Temp:  36.7 C  SpO2: 92% (!) 89%    Last Pain:  Vitals:   08/16/17 1109  TempSrc: Oral  PainSc:                  Brain Honeycutt S

## 2017-08-17 ENCOUNTER — Encounter (HOSPITAL_COMMUNITY): Payer: Self-pay | Admitting: Orthopedic Surgery

## 2017-08-17 LAB — BASIC METABOLIC PANEL
ANION GAP: 5 (ref 5–15)
BUN: 9 mg/dL (ref 6–20)
CALCIUM: 8 mg/dL — AB (ref 8.9–10.3)
CO2: 27 mmol/L (ref 22–32)
Chloride: 107 mmol/L (ref 101–111)
Creatinine, Ser: 0.97 mg/dL (ref 0.61–1.24)
GFR calc Af Amer: >60 mL/min (ref >60)
GFR calc non Af Amer: >60 mL/min (ref >60)
GLUCOSE: 117 mg/dL — AB (ref 65–99)
Potassium: 3.9 mmol/L (ref 3.5–5.1)
Sodium: 139 mmol/L (ref 135–145)

## 2017-08-17 LAB — CBC
HEMATOCRIT: 26.6 % — AB (ref 39.0–52.0)
Hemoglobin: 8.5 g/dL — ABNORMAL LOW (ref 13.0–17.0)
MCH: 24.2 pg — AB (ref 26.0–34.0)
MCHC: 32 g/dL (ref 30.0–36.0)
MCV: 75.8 fL — AB (ref 78.0–100.0)
Platelets: 109 10*3/uL — ABNORMAL LOW (ref 150–400)
RBC: 3.51 MIL/uL — ABNORMAL LOW (ref 4.22–5.81)
RDW: 19.7 % — AB (ref 11.5–15.5)
WBC: 12 10*3/uL — ABNORMAL HIGH (ref 4.0–10.5)

## 2017-08-17 MED ORDER — POLYETHYLENE GLYCOL 3350 17 G PO PACK
17.0000 g | PACK | Freq: Two times a day (BID) | ORAL | Status: AC
  Administered 2017-08-17 – 2017-08-18 (×3): 17 g via ORAL
  Filled 2017-08-17 (×3): qty 1

## 2017-08-17 MED ORDER — SODIUM CHLORIDE 0.9 % IV SOLN
INTRAVENOUS | Status: DC | PRN
Start: 2017-08-17 — End: 2017-08-24
  Administered 2017-08-18 – 2017-08-19 (×2): via INTRAVENOUS

## 2017-08-17 MED ORDER — BISACODYL 5 MG PO TBEC
10.0000 mg | DELAYED_RELEASE_TABLET | Freq: Once | ORAL | Status: AC
  Administered 2017-08-17: 10 mg via ORAL
  Filled 2017-08-17: qty 2

## 2017-08-17 MED ORDER — SENNOSIDES-DOCUSATE SODIUM 8.6-50 MG PO TABS
2.0000 | ORAL_TABLET | Freq: Two times a day (BID) | ORAL | Status: DC
  Administered 2017-08-17 – 2017-08-23 (×12): 2 via ORAL
  Filled 2017-08-17 (×12): qty 2

## 2017-08-17 NOTE — Brief Op Note (Signed)
08/16/2017  4:43 PM  PATIENT:  Efraim Kaufmannavid B Ortman  84 y.o. male  PRE-OPERATIVE DIAGNOSIS:  GRADE 3 OPEN RIGHT DISTAL TIBIA/ FIBULA FRACTURE  POST-OPERATIVE DIAGNOSIS:  GRADE 3B OPEN RIGHT DISTAL TIBIA/ FIBULA FRACTURE  PROCEDURE:  Procedure(s): 1. IRRIGATION AND DEBRIDEMENT EXTREMITY OPEN TIBIA INCLUDING BONE AND TENDON (Right)  2. DRESSING CHANGE UNDER ANESTHESIA  SURGEON:  Surgeon(s) and Role:    * Myrene GalasHandy, Karrin Eisenmenger, MD - Primary  PHYSICIAN ASSISTANT: Montez MoritaKEITH PAUL, PA-C  ANESTHESIA:   general  EBL:  25 mL   BLOOD ADMINISTERED:none  DRAINS: wound vac   LOCAL MEDICATIONS USED:  NONE  SPECIMEN:  No Specimen  DISPOSITION OF SPECIMEN:  N/A  COUNTS:  YES  TOURNIQUET:  * No tourniquets in log *  DICTATION: .Other Dictation: Dictation Number [id number]  PLAN OF CARE: Admit to inpatient   PATIENT DISPOSITION:  PACU - hemodynamically stable.   Delay start of Pharmacological VTE agent (>24hrs) due to surgical blood loss or risk of bleeding: no

## 2017-08-17 NOTE — Progress Notes (Signed)
Subjective: 1 Day Post-Op Procedure(s) (LRB): IRRIGATION AND DEBRIDEMENT EXTREMITY (Right) Patient reports pain as moderate.   Denies nausea or vomiting  Objective: Vital signs in last 24 hours: Temp:  [98.1 F (36.7 C)-98.7 F (37.1 C)] 98.1 F (36.7 C) (06/22 0952) Pulse Rate:  [70-79] 70 (06/22 0952) Resp:  [20] 20 (06/22 0952) BP: (108-110)/(71-77) 108/71 (06/22 0952) SpO2:  [96 %-99 %] 99 % (06/22 0952)  Intake/Output from previous day: 06/21 0701 - 06/22 0700 In: 1778.3 [P.O.:240; I.V.:960.2; Blood:315; IV Piggyback:163.1] Out: 1210 [Urine:1050; Drains:135; Blood:25] Intake/Output this shift: Total I/O In: -  Out: 1575 [Urine:1500; Drains:75]  Recent Labs    08/14/17 1843 08/14/17 1900 08/15/17 0527 08/16/17 0506 08/17/17 0358  HGB 10.2* 10.9* 7.5* 8.6* 8.5*   Recent Labs    08/16/17 0506 08/17/17 0358  WBC 12.9* 12.0*  RBC 3.67* 3.51*  HCT 27.8* 26.6*  PLT 123* 109*   Recent Labs    08/16/17 0506 08/17/17 0358  NA 137 139  K 3.9 3.9  CL 108 107  CO2 24 27  BUN 14 9  CREATININE 1.08 0.97  GLUCOSE 126* 117*  CALCIUM 7.9* 8.0*   Recent Labs    08/16/17 0506  INR 1.25    Neurovascular intact Sensation intact distally Intact pulses distally Compartment soft VAC active, small amount of bloody drainage  Anticipated LOS equal to or greater than 2 midnights due to - Age 665 and older with one or more of the following:  - Obesity  - Expected need for hospital services (PT, OT, Nursing) required for safe  discharge  - Anticipated need for postoperative skilled nursing care or inpatient rehab  - Active co-morbidities: Coronary Artery Disease OR   - Unanticipated findings during/Post Surgery: may need further surgery/bka  - Patient is a high risk of re-admission due to: None  Active Problems:   HTN (hypertension)   HLD (hyperlipidemia)   Hypothyroidism   S/P TAVR (transcatheter aortic valve replacement)   Open right ankle  fracture   Assessment/Plan: 1 Day Post-Op Procedure(s) (LRB): IRRIGATION AND DEBRIDEMENT EXTREMITY (Right) Advance diet  Continue VAC awiting possible surgery Monday per Dr Cindi CarbonHandy    BLAIR Mitcheal Sweetin 08/17/2017, 11:17 AM

## 2017-08-17 NOTE — Op Note (Signed)
NAME: Charles Patrick, Charles B. MEDICAL RECORD RU:[id number] ACCOUNT [id number] DATE OF BIRTH:24-Mar-1924 FACILITY: [facility] LOCATION: [facility] PHYSICIAN:Lynnie Koehler H. Fairley Copher, MD  OPERATIVE REPORT  DATE OF PROCEDURE:  08/16/2017  PREOPERATIVE DIAGNOSES:  Grade III open right distal tibia and fibula fracture.   POSTOPERATIVE DIAGNOSIS:  Grade IIIB open right distal tibia and fibula fracture.  PROCEDURES: 1.  Irrigation and debridement of open tibia and fibula fractures including removal of bone and tendon. 2.  Dressing change under anesthesia, right leg.  SURGEON:  Myrene GalasMichael Bettymae Yott, MD  ASSISTANT:  Montez MoritaKeith Paul, PA-C   ANESTHESIA:  General.  COMPLICATIONS:  None.  ESTIMATED BLOOD LOSS:  25 mL.  SPECIMENS:  None.  DISPOSITION:  To PACU.  CONDITION:  Stable.  BRIEF SUMMARY OF INDICATION FOR PROCEDURE:  The patient is a very pleasant 84 year old male with multiple medical problems who sustained a crush injury using a zero turn mower to his right leg during which the leg was pinned.  He was initially seen and  evaluated by Dr. Toni ArthursJohn Hewitt, who performed a debridement and external fixation, noting at that time complete disruption of his posterior tibial artery as well as the nerve and much of the Achilles tendon and posterior flexor compartment.  He had a wound  VAC dressing placed at that time.  I discussed with the patient and the family the risk and benefits of further surgical treatment with the goal being to prevent infection and either to perform serial procedures with the goal of limb salvage, which  would be a challenge in a 84 year old with medical problems and an insensate foot versus a below-knee amputation.  CT scan and plain films were obtained, and there was fracture extension into the joint but with very minimal disruption and primarily a  large metaphyseal defect that was more medial than lateral.  As a result, I did feel that a salvage with staged placement of a cement spacer and then  subsequent reamed intramedullary aspiration graft, even at 84 years old, may be an option.  The family  wanted to proceed with this even though BKA would result in sooner return to activity and function possibly.  They did consent for both but did not think that amputation would be likely undertaken.  The patient was taken to the operating room where general anesthesia was induced.  They then removed his dressing as well as the wound VAC.  At that time, it was clear that the soft tissue defect was much more substantial than had been perceivable by the  1-dimensional pictures and that there was as much as a 6-inch area of missing musculature along the posterior cortex.  There was no Achilles tendon, which had been previously ascertained, but a matching skin defect that was as much as 6 cm in one  dimension and five in another.  At this time, I had my assistant continue with prepping of the leg and asked to meet with the family in the conference room.  I informed the son of the findings that this did not represent a viable option for retention of  the extremity or salvage as the patient's leg would need to be shortened as much as 4 inches to regain soft tissue closure in continuity, and this would still leave him with an insensate foot.  The patient's son did not want to proceed with amputation at  this time, however, and contacted additional family members who supported his decision to let the patient come to grips with the need for amputation as  he had really fixated on a salvage since our preceding talk the night before surgery and was  convinced this was going to be the course taken.  As a result, I returned to the OR where I did perform additional debridement, removing devascularized tendon proximally and distally, as well as some small areas of necrotic tissue and some cortical fragments of bone, but overall it appeared that an  outstanding debridement had been performed initially by Dr. [name] in  what was described as a grossly contaminated wound.  I did close the cortex as best as possible and applied white foam and then black foam for placement of the VAC dressing, this  dressing change being undertaken under anesthesia as well at the conclusion of the debridement.  A sterile gently compressive dressing was placed over this, and then the patient awakened from anesthesia and transported to the PACU in stable condition.   Montez Morita, PA-C, assisted throughout.  His assistance was necessary such that we could continue while the family discussion took place and also for exposure, delivery, and application of the complex dressing.  PROGNOSIS: The patient will return to the OR on Monday for a below-knee amputation.  This will be followed by evaluation from the prosthetic office, and we hope to return the patient to early function and prosthetic use as he is very motivated and active despite  his chronological age.  LN/NUANCE  D:08/17/2017 T:08/17/2017 JOB:[id number]

## 2017-08-17 NOTE — Progress Notes (Signed)
PROGRESS NOTE                                                                                                                                                                                                             Patient Demographics:    Charles Patrick, is a 84 y.o. male, DOB - 03/21/1924, ZOX:[id number]  Admit date - 08/14/2017   Admitting Physician Toni Arthurs, MD  Outpatient Primary MD for the patient is Elise Benne Ellery Plunk  LOS - 2   Chief Complaint  Patient presents with  . Extremity Laceration    Cut by Lawnmower       Brief Narrative   84 year old male with history of severe aortic stenosis status post TAVR last November, hyperlipidemia, BPH, chronic anemia, hypothyroidism had injured as right lower extremity with lawnmower .  Patient has significant bleed and x-rays done in the ER revealed fracture of the tibia.  Went for surgical repair by Dr. Cecilie Kicks with 08/15/2017, and were consulted for medical management   Subjective:    Charles Patrick today has, No headache, No chest pain, No abdominal pain -right lower extremity pain is controlled   Assessment  & Plan :    Active Problems:   HTN (hypertension)   HLD (hyperlipidemia)   Hypothyroidism   S/P TAVR (transcatheter aortic valve replacement)   Open right ankle fracture   Right lower extremity injury with tibial and fibula fracture -Treatment per primary orthopedic team, status post irrigation and excisional debridement, with open reduction of tibia and fibula fracture and application of multiplane external fixator and wound VAC.,  Patient injury so significant, likely will need amputation as discussed with orthopedic, possibly this Monday, continue with IV antibiotics meanwhile. -DVT prophylaxis on Lovenox for today and tomorrow, then will hold after that anticipation for surgery, discussed with Dr. Marcello Fennel, patient with significant bleed during surgery and still significant blood  output through the wound VAC, so we will hold on resuming aspirin and Plavix.  Status post TAVR  -Appears to be euvolemic, continue to monitor closely  Hypothyroidism  -Resume home dose Synthroid  Anemia -Neck microcytic anemia, as well acute blood loss anemia postoperatively, was transfused 1 unit PRBC for hemoglobin of 7.5, with good response, continue to monitor closely and transfuse for hemoglobin less than 8  BPH  -Continue with tamsulosin  Hyperlipidemia  -  Continue home dose statin  Continue to hold aspirin and Plavix     Code Status : Full  Family Communication  : none at bedside  Procedures  :  By Dr. Beather Arbour on 08/15/2017 1.  Irrigation and excisional debridement of open right tibia and fibula fractures 2.  Complex closure of right leg wounds totaling 25 cm in length 3.  Open reduction of tibia and fibula fractures 4.  Application of multiplane external fixator 5.  Application of wound VAC    DVT Prophylaxis  : on lovenox, will hold after tomorrow in anticipation for surgery on Monday  Lab Results  Component Value Date   PLT 109 (L) 08/17/2017    Antibiotics  :    Anti-infectives (From admission, onward)   Start     Dose/Rate Route Frequency Ordered Stop   08/16/17 1630  ceFAZolin (ANCEF) IVPB 1 g/50 mL premix     1 g 100 mL/hr over 30 Minutes Intravenous Every 8 hours 08/16/17 1110 08/20/17 1359   08/16/17 1600  gentamicin (GARAMYCIN) 520 mg in dextrose 5 % 100 mL IVPB     520 mg 113 mL/hr over 60 Minutes Intravenous Every 36 hours 08/15/17 1623     08/15/17 2200  ceFAZolin (ANCEF) IVPB 1 g/50 mL premix  Status:  Discontinued     1 g 100 mL/hr over 30 Minutes Intravenous Every 8 hours 08/15/17 1418 08/16/17 1110   08/15/17 0400  ceFAZolin (ANCEF) IVPB 1 g/50 mL premix  Status:  Discontinued     1 g 100 mL/hr over 30 Minutes Intravenous Every 6 hours 08/15/17 0226 08/15/17 1418   08/15/17 0245  gentamicin (GARAMYCIN) 520 mg in dextrose 5 % 100 mL  IVPB     520 mg 113 mL/hr over 60 Minutes Intravenous  Once 08/15/17 0231 08/15/17 0356   08/14/17 1845  ceFAZolin (ANCEF) IVPB 1 g/50 mL premix     1 g 100 mL/hr over 30 Minutes Intravenous  Once 08/14/17 1840 08/14/17 2034        Objective:   Vitals:   08/16/17 1115 08/16/17 1952 08/17/17 0952 08/17/17 1331  BP:  110/77 108/71 104/72  Pulse:  79 70 66  Resp:   20 16  Temp:  98.7 F (37.1 C) 98.1 F (36.7 C) 98.4 F (36.9 C)  TempSrc:  Oral Oral Oral  SpO2: 94% 96% 99% 95%  Weight:      Height:        Wt Readings from Last 3 Encounters:  08/14/17 77.1 kg (170 lb)  02/13/17 78.7 kg (173 lb 8 oz)  01/30/17 79.4 kg (175 lb)     Intake/Output Summary (Last 24 hours) at 08/17/2017 1336 Last data filed at 08/17/2017 1332 Gross per 24 hour  Intake 1532.63 ml  Output 2450 ml  Net -917.37 ml     Physical Exam  Awake Alert, Oriented X 3, in good spirits, no apparent distress, right eye blindness. Symmetrical Chest wall movement, Good air movement bilaterally, CTAB RRR,No Gallops,Rubs or new Murmurs, No Parasternal Heave +ve B.Sounds, Abd Soft, No tenderness,  No rebound - guarding or rigidity. Right lower extremity bandaged, with external fixation device applied, with wound VAC as well.    Data Review:    CBC Recent Labs  Lab 08/14/17 1843 08/14/17 1900 08/15/17 0527 08/16/17 0506 08/17/17 0358  WBC 12.1*  --  13.6* 12.9* 12.0*  HGB 10.2* 10.9* 7.5* 8.6* 8.5*  HCT 34.2* 32.0* 25.4* 27.8* 26.6*  PLT 206  --  142* 123* 109*  MCV 71.5*  --  72.0* 75.7* 75.8*  MCH 21.3*  --  21.2* 23.4* 24.2*  MCHC 29.8*  --  29.5* 30.9 32.0  RDW 17.2*  --  17.2* 19.5* 19.7*  LYMPHSABS 4.1*  --   --   --   --   MONOABS 0.8  --   --   --   --   EOSABS 0.3  --   --   --   --   BASOSABS 0.1  --   --   --   --     Chemistries  Recent Labs  Lab 08/14/17 1900 08/15/17 0527 08/16/17 0506 08/17/17 0358  NA 143  --  137 139  K 3.6  --  3.9 3.9  CL 107  --  108 107  CO2   --   --  24 27  GLUCOSE 151*  --  126* 117*  BUN 15  --  14 9  CREATININE 1.10 1.12 1.08 0.97  CALCIUM  --   --  7.9* 8.0*  AST  --   --  21  --   ALT  --   --  13*  --   ALKPHOS  --   --  49  --   BILITOT  --   --  0.5  --    ------------------------------------------------------------------------------------------------------------------ No results for input(s): CHOL, HDL, LDLCALC, TRIG, CHOLHDL, LDLDIRECT in the last 72 hours.  Lab Results  Component Value Date   HGBA1C 5.6 01/18/2017   ------------------------------------------------------------------------------------------------------------------ No results for input(s): TSH, T4TOTAL, T3FREE, THYROIDAB in the last 72 hours.  Invalid input(s): FREET3 ------------------------------------------------------------------------------------------------------------------ No results for input(s): VITAMINB12, FOLATE, FERRITIN, TIBC, IRON, RETICCTPCT in the last 72 hours.  Coagulation profile Recent Labs  Lab 08/16/17 0506  INR 1.25    No results for input(s): DDIMER in the last 72 hours.  Cardiac Enzymes No results for input(s): CKMB, TROPONINI, MYOGLOBIN in the last 168 hours.  Invalid input(s): CK ------------------------------------------------------------------------------------------------------------------    Component Value Date/Time   BNP 109.0 (H) 01/18/2017 1131    Inpatient Medications  Scheduled Meds: . acetaminophen  1,000 mg Oral Q8H  . docusate sodium  100 mg Oral BID  . enoxaparin (LOVENOX) injection  30 mg Subcutaneous Q24H  . levothyroxine  50 mcg Oral QAC breakfast  . mupirocin ointment  1 application Nasal BID  . senna  1 tablet Oral BID  . simvastatin  40 mg Oral q1800  . tamsulosin  0.4 mg Oral QPC supper   Continuous Infusions: . sodium chloride Stopped (08/17/17 0800)  . sodium chloride    .  ceFAZolin (ANCEF) IV Stopped (08/17/17 [id number])  . gentamicin Stopped (08/16/17 1752)  .  methocarbamol (ROBAXIN)  IV     PRN Meds:.sodium chloride, diphenhydrAMINE, HYDROmorphone (DILAUDID) injection, methocarbamol **OR** methocarbamol (ROBAXIN)  IV, ondansetron **OR** ondansetron (ZOFRAN) IV, oxyCODONE, oxyCODONE  Micro Results Recent Results (from the past 240 hour(s))  Surgical PCR screen     Status: None   Collection Time: 08/15/17  7:48 AM  Result Value Ref Range Status   MRSA, PCR NEGATIVE NEGATIVE Final   Staphylococcus aureus NEGATIVE NEGATIVE Final    Comment: (NOTE) The Xpert SA Assay (FDA approved for NASAL specimens in patients 82 years of age and older), is one component of a comprehensive surveillance program. It is not intended to diagnose infection nor to guide or monitor treatment. Performed at [facility], [address] [place], [place] [address]  Radiology Reports Dg Tibia/fibula Right  Result Date: 08/15/2017 CLINICAL DATA:  External fixation of right ankle with irrigation debridement and application of wound VAC. EXAM: RIGHT TIBIA AND FIBULA - 2 VIEW COMPARISON:  Preoperative radiographs from 08/14/2017 FINDINGS: Five C-arm fluoroscopic views demonstrate external fixation across an open fracture of the distal tibial and fibular diaphysis. Fine bony detail is somewhat limited due to the fluoroscopic technique. A total of 9 seconds of fluoroscopic time was utilized. IMPRESSION: C-arm fluoroscopic views demonstrates placement of an external fixation device across a known comminuted open fracture of the distal tibia and fibula. Electronically Signed   By: Tollie Eth M.D.   On: 08/15/2017 01:46   Ct Ankle Right Wo Contrast  Result Date: 08/16/2017 CLINICAL DATA:  Right ankle fracture post external fixation. EXAM: CT OF THE RIGHT ANKLE WITHOUT CONTRAST TECHNIQUE: Multidetector CT imaging of the right ankle was performed according to the standard protocol. Multiplanar CT image reconstructions were also generated. COMPARISON:  Radiographs 08/14/2016.  FINDINGS: Bones/Joint/Cartilage The ankle is splinted. External fixator passes through the calcaneal tuberosity. The proximal fixator is not visualized. There is a comminuted and moderately displaced fracture of the distal tibial metadiaphysis. Proximally, there is an oblique fracture extending 10 cm proximal to the tibial plafond which is associated with 1.4 cm of residual lateral displacement and approximately 1.7 cm of proximal displacement. There is approximately 20 degrees of apex posterior angulation at this fracture. There is a large butterfly fragment posteriorly, measuring up to 2.5 cm on sagittal image 48/8. Fractures extend into the posterolateral aspect of the tibial plafond. There is no displacement of the articular surface. Comminuted fracture of the distal fibular diaphysis demonstrates near anatomic reduction, although there is approximately 18 degrees of residual apex posterior angulation on the sagittal images. This fracture does not extend into the distal tibiofibular joint. There is no widening of that joint. The ankle mortise is intact. The talus is located and appears intact. There is a small amount of gas lateral to the subtalar joint without definite associated fracture. The additional tarsal bones appear intact. Ligaments Suboptimally assessed by CT. Muscles and Tendons The Achilles tendon appears ruptured approximately 9.7 cm proximal to its calcaneal insertion. There is an approximately 3.6 cm gap in the tendon on sagittal image 36/10. Within the tendon gap, there is a complex fluid collection with multiple air bubbles, measuring up to 3.8 x 1.7 cm on axial image 26/4. There are multiple fracture fragments deep to the disrupted tendon. There are surgical staples along the retracted portions of the tendon. The medial flexor tendons also appear displaced into the fracture and there integrity is difficult to confirm. They are intact distally and normally located. The peroneal tendons appear  intact, although are slightly subluxed laterally from the retromalleolar groove. The anterior extensor tendons are intact. Soft tissues Extensive soft tissue injury consistent with an open fracture. There are multiple air bubbles throughout the soft tissues, with a complex air-fluid collection posterior to the distal tibia, between the retracted portions of the ruptured Achilles tendon. No other focal fluid collections are seen. There is subcutaneous edema throughout the lower leg. IMPRESSION: 1. Comminuted fracture of the distal tibia demonstrates mild residual displacement and angulation. Fracture extends into the posterolateral aspect of the tibial plafond, although the intra-articular portion is not displaced. 2. Near anatomic reduction of comminuted fracture of the distal fibular diaphysis. 3. No dislocation or tarsal bone injury identified. 4. Complete rupture of the Achilles tendon located at the level of  the proximal fracture with associated moderate tendon retraction and a complex air-fluid collection between the retracted portions of the tendon. The medial flexor tendons are also displaced into the tibial fracture and their integrity is difficult to confirm. Electronically Signed   By: Carey Bullocks M.D.   On: 08/16/2017 08:04   Dg Chest Portable 1 View  Result Date: 08/14/2017 CLINICAL DATA:  Struck by lawnmower. EXAM: PORTABLE CHEST 1 VIEW COMPARISON:  Chest radiograph January 22, 2017 FINDINGS: Cardiac silhouette is mildly enlarged, unchanged. Calcified aortic arch, potential ectasia. Status post cardiac valve replacement. Mild bibasilar strandy densities without pleural effusion or focal consolidation. No pneumothorax. Soft tissue planes and included osseous structures are non sub suspicious.Old LEFT rib fracture. IMPRESSION: Bibasilar atelectasis/scarring. Stable cardiomegaly. Aortic Atherosclerosis (ICD10-I70.0). Electronically Signed   By: Awilda Metro M.D.   On: 08/14/2017 19:15   Dg  Tibia/fibula Right Port  Result Date: 08/14/2017 CLINICAL DATA:  84 year old male status post trauma from spinning lung lower blades. EXAM: PORTABLE RIGHT TIBIA AND FIBULA - 2 VIEW COMPARISON:  None. FINDINGS: AP and cross-table lateral views. Severe oblique penetrating trauma through the right tibia and fibula meta diaphyses. Severely comminuted fractures with numerous small bone fragments scattered in the adjacent soft tissues. Severe associated soft tissue injury. Oblique superior to inferior and anterior to posterior trajectory. Mild lateral displacement and medial angulation of the distal fragments. Fracture of the tibia extends toward distal tibia fibular syndesmosis, but appears to stop short of the mortise joint. Right mortise joint remains normally aligned. Talar dome, and malleoli intact. Calcaneus intact. Superimposed calcified peripheral vascular disease. The mid and proximal right tibia and fibula are intact. Maintained normal alignment at the right knee. Visible bones of the right foot appear intact. IMPRESSION: 1. Severe penetrating trauma through the right tibia and fibula at an oblique angle with severely comminuted fractures of both bones and numerous small bone fragments. 2. Distal tibia fracture likely extends into the distal tibia-fibular syndesmosis, but appears to spare the mortise joint. Electronically Signed   By: Odessa Fleming M.D.   On: 08/14/2017 19:21   Dg C-arm 1-60 Min-no Report  Result Date: 08/15/2017 Fluoroscopy was utilized by the requesting physician.  No radiographic interpretation.     Huey Bienenstock M.D on 08/17/2017 at 1:36 PM  Between 7am to 7pm - Pager - [phone]  After 7pm go to [email or web address] - password [facility]  [facility] -  Office  [phone]

## 2017-08-18 LAB — BPAM RBC
BLOOD PRODUCT EXPIRATION DATE: 201907162359
BLOOD PRODUCT EXPIRATION DATE: 201907162359
BLOOD PRODUCT EXPIRATION DATE: 201907202359
Blood Product Expiration Date: 201907162359
ISSUE DATE / TIME: 201906201456
ISSUE DATE / TIME: 201906210740
ISSUE DATE / TIME: 201906202229
ISSUE DATE / TIME: 201906210740
UNIT TYPE AND RH: 5100
UNIT TYPE AND RH: 5100
UNIT TYPE AND RH: 5100
Unit Type and Rh: 5100

## 2017-08-18 LAB — TYPE AND SCREEN
ABO/RH(D): O POS
Antibody Screen: NEGATIVE
UNIT DIVISION: 0
Unit division: 0
Unit division: 0
Unit division: 0

## 2017-08-18 MED ORDER — MAGNESIUM CITRATE PO SOLN
0.5000 | Freq: Once | ORAL | Status: AC
  Administered 2017-08-18: 0.5 via ORAL
  Filled 2017-08-18: qty 296

## 2017-08-18 MED ORDER — BISACODYL 10 MG RE SUPP
10.0000 mg | RECTAL | Status: DC
  Administered 2017-08-18: 10 mg via RECTAL
  Filled 2017-08-18: qty 1

## 2017-08-18 MED ORDER — BISACODYL 5 MG PO TBEC
10.0000 mg | DELAYED_RELEASE_TABLET | Freq: Once | ORAL | Status: AC
  Administered 2017-08-18: 10 mg via ORAL
  Filled 2017-08-18: qty 2

## 2017-08-18 NOTE — Progress Notes (Signed)
PROGRESS NOTE                                                                                                                                                                                                             Patient Demographics:    Charles Patrick, is a 84 y.o. male, DOB - 12-29-1924, ZOX:[id number]  Admit date - 08/14/2017   Admitting Physician Toni Arthurs, MD  Outpatient Primary MD for the patient is Elise Benne Ellery Plunk  LOS - 3   Chief Complaint  Patient presents with  . Extremity Laceration    Cut by Lawnmower       Brief Narrative   84 year old male with history of severe aortic stenosis status post TAVR last November, hyperlipidemia, BPH, chronic anemia, hypothyroidism had injured as right lower extremity with lawnmower .  Patient has significant bleed and x-rays done in the ER revealed fracture of the tibia.  Went for surgical repair by Dr. Cecilie Kicks with 08/15/2017, and were consulted for medical management   Subjective:    Charles Patrick today has, No headache, No chest pain, No abdominal pain -right lower extremity pain is controlled   Assessment  & Plan :    Active Problems:   HTN (hypertension)   HLD (hyperlipidemia)   Hypothyroidism   S/P TAVR (transcatheter aortic valve replacement)   Open right ankle fracture   Right lower extremity injury with tibial and fibula fracture -Treatment per primary orthopedic team, status post irrigation and excisional debridement, with open reduction of tibia and fibula fracture and application of multiplane external fixator and wound VAC.,  Patient injury so significant, likely will need amputation as discussed with orthopedic, likely tomorrow continue with IV antibiotics meanwhile. -DVT prophylaxis on Lovenox for today and tomorrow, then will hold after that anticipation for surgery, discussed with Dr. Marcello Fennel, patient with significant bleed during surgery and still significant blood output  through the wound VAC, so we will hold on resuming aspirin and Plavix.  Status post TAVR  -Appears to be euvolemic, continue to monitor closely  Hypothyroidism  -Resume home dose Synthroid  Anemia -Neck microcytic anemia, as well acute blood loss anemia postoperatively, was transfused 1 unit PRBC for hemoglobin of 7.5, with good response, continue to monitor closely and transfuse for hemoglobin less than 8  BPH  -Continue with tamsulosin  Hyperlipidemia  -  Continue home dose statin  Continue to hold aspirin and Plavix as high risk of bleeding from his wound as discussed with orthopedic, actually unclear why patient is on both aspirin and Plavix,      Code Status : Full  Family Communication  : none at bedside  Procedures  :  By Dr. Beather Arbour on 08/15/2017 1.  Irrigation and excisional debridement of open right tibia and fibula fractures 2.  Complex closure of right leg wounds totaling 25 cm in length 3.  Open reduction of tibia and fibula fractures 4.  Application of multiplane external fixator 5.  Application of wound VAC    DVT Prophylaxis  : on lovenox, will hold after today's dose in anticipation for surgery tomorrow  Lab Results  Component Value Date   PLT 109 (L) 08/17/2017    Antibiotics  :    Anti-infectives (From admission, onward)   Start     Dose/Rate Route Frequency Ordered Stop   08/16/17 1630  ceFAZolin (ANCEF) IVPB 1 g/50 mL premix     1 g 100 mL/hr over 30 Minutes Intravenous Every 8 hours 08/16/17 1110 08/20/17 1359   08/16/17 1600  gentamicin (GARAMYCIN) 520 mg in dextrose 5 % 100 mL IVPB     520 mg 113 mL/hr over 60 Minutes Intravenous Every 36 hours 08/15/17 1623     08/15/17 2200  ceFAZolin (ANCEF) IVPB 1 g/50 mL premix  Status:  Discontinued     1 g 100 mL/hr over 30 Minutes Intravenous Every 8 hours 08/15/17 1418 08/16/17 1110   08/15/17 0400  ceFAZolin (ANCEF) IVPB 1 g/50 mL premix  Status:  Discontinued     1 g 100 mL/hr over 30  Minutes Intravenous Every 6 hours 08/15/17 0226 08/15/17 1418   08/15/17 0245  gentamicin (GARAMYCIN) 520 mg in dextrose 5 % 100 mL IVPB     520 mg 113 mL/hr over 60 Minutes Intravenous  Once 08/15/17 0231 08/15/17 0356   08/14/17 1845  ceFAZolin (ANCEF) IVPB 1 g/50 mL premix     1 g 100 mL/hr over 30 Minutes Intravenous  Once 08/14/17 1840 08/14/17 2034        Objective:   Vitals:   08/17/17 1331 08/17/17 1749 08/17/17 1937 08/18/17 0614  BP: 104/72 107/80 113/77 113/77  Pulse: 66 63 61 (!) 56  Resp: 16 16    Temp: 98.4 F (36.9 C) 98.4 F (36.9 C) 98.4 F (36.9 C) 97.8 F (36.6 C)  TempSrc: Oral Oral Oral Oral  SpO2: 95% 96% 95% 94%  Weight:      Height:        Wt Readings from Last 3 Encounters:  08/14/17 77.1 kg (170 lb)  02/13/17 78.7 kg (173 lb 8 oz)  01/30/17 79.4 kg (175 lb)     Intake/Output Summary (Last 24 hours) at 08/18/2017 1414 Last data filed at 08/18/2017 0900 Gross per 24 hour  Intake 480 ml  Output 1800 ml  Net -1320 ml     Physical Exam  Awake Alert, Oriented X 3, in good spirits, no apparent distress, right eye blindness. Symmetrical Chest wall movement, Good air movement bilaterally, CTAB RRR,No Gallops,Rubs or new Murmurs, No Parasternal Heave +ve B.Sounds, Abd Soft, No tenderness,  No rebound - guarding or rigidity. Right lower extremity bandaged, with external fixation device applied, with wound VAC as well.    Data Review:    CBC Recent Labs  Lab 08/14/17 1843 08/14/17 1900 08/15/17 1610 08/16/17 0506 08/17/17 9604  WBC 12.1*  --  13.6* 12.9* 12.0*  HGB 10.2* 10.9* 7.5* 8.6* 8.5*  HCT 34.2* 32.0* 25.4* 27.8* 26.6*  PLT 206  --  142* 123* 109*  MCV 71.5*  --  72.0* 75.7* 75.8*  MCH 21.3*  --  21.2* 23.4* 24.2*  MCHC 29.8*  --  29.5* 30.9 32.0  RDW 17.2*  --  17.2* 19.5* 19.7*  LYMPHSABS 4.1*  --   --   --   --   MONOABS 0.8  --   --   --   --   EOSABS 0.3  --   --   --   --   BASOSABS 0.1  --   --   --   --      Chemistries  Recent Labs  Lab 08/14/17 1900 08/15/17 0527 08/16/17 0506 08/17/17 0358  NA 143  --  137 139  K 3.6  --  3.9 3.9  CL 107  --  108 107  CO2  --   --  24 27  GLUCOSE 151*  --  126* 117*  BUN 15  --  14 9  CREATININE 1.10 1.12 1.08 0.97  CALCIUM  --   --  7.9* 8.0*  AST  --   --  21  --   ALT  --   --  13*  --   ALKPHOS  --   --  49  --   BILITOT  --   --  0.5  --    ------------------------------------------------------------------------------------------------------------------ No results for input(s): CHOL, HDL, LDLCALC, TRIG, CHOLHDL, LDLDIRECT in the last 72 hours.  Lab Results  Component Value Date   HGBA1C 5.6 01/18/2017   ------------------------------------------------------------------------------------------------------------------ No results for input(s): TSH, T4TOTAL, T3FREE, THYROIDAB in the last 72 hours.  Invalid input(s): FREET3 ------------------------------------------------------------------------------------------------------------------ No results for input(s): VITAMINB12, FOLATE, FERRITIN, TIBC, IRON, RETICCTPCT in the last 72 hours.  Coagulation profile Recent Labs  Lab 08/16/17 0506  INR 1.25    No results for input(s): DDIMER in the last 72 hours.  Cardiac Enzymes No results for input(s): CKMB, TROPONINI, MYOGLOBIN in the last 168 hours.  Invalid input(s): CK ------------------------------------------------------------------------------------------------------------------    Component Value Date/Time   BNP 109.0 (H) 01/18/2017 1131    Inpatient Medications  Scheduled Meds: . acetaminophen  1,000 mg Oral Q8H  . bisacodyl  10 mg Oral Once  . docusate sodium  100 mg Oral BID  . levothyroxine  50 mcg Oral QAC breakfast  . polyethylene glycol  17 g Oral BID  . senna-docusate  2 tablet Oral BID  . simvastatin  40 mg Oral q1800  . tamsulosin  0.4 mg Oral QPC supper   Continuous Infusions: . sodium chloride Stopped  (08/17/17 0800)  . sodium chloride    .  ceFAZolin (ANCEF) IV 1 g (08/18/17 1346)  . gentamicin 520 mg (08/18/17 0442)  . methocarbamol (ROBAXIN)  IV     PRN Meds:.sodium chloride, diphenhydrAMINE, HYDROmorphone (DILAUDID) injection, methocarbamol **OR** methocarbamol (ROBAXIN)  IV, ondansetron **OR** ondansetron (ZOFRAN) IV, oxyCODONE, oxyCODONE  Micro Results Recent Results (from the past 240 hour(s))  Surgical PCR screen     Status: None   Collection Time: 08/15/17  7:48 AM  Result Value Ref Range Status   MRSA, PCR NEGATIVE NEGATIVE Final   Staphylococcus aureus NEGATIVE NEGATIVE Final    Comment: (NOTE) The Xpert SA Assay (FDA approved for NASAL specimens in patients 22 years of age and older), is one component of a comprehensive surveillance program.  It is not intended to diagnose infection nor to guide or monitor treatment. Performed at [facility], [address] [place], [place] [address]     Radiology Reports Dg Tibia/fibula Right  Result Date: 08/15/2017 CLINICAL DATA:  External fixation of right ankle with irrigation debridement and application of wound VAC. EXAM: RIGHT TIBIA AND FIBULA - 2 VIEW COMPARISON:  Preoperative radiographs from 08/14/2017 FINDINGS: Five C-arm fluoroscopic views demonstrate external fixation across an open fracture of the distal tibial and fibular diaphysis. Fine bony detail is somewhat limited due to the fluoroscopic technique. A total of 9 seconds of fluoroscopic time was utilized. IMPRESSION: C-arm fluoroscopic views demonstrates placement of an external fixation device across a known comminuted open fracture of the distal tibia and fibula. Electronically Signed   By: Tollie Ethavid  Kwon M.D.   On: 08/15/2017 01:46   Ct Ankle Right Wo Contrast  Result Date: 08/16/2017 CLINICAL DATA:  Right ankle fracture post external fixation. EXAM: CT OF THE RIGHT ANKLE WITHOUT CONTRAST TECHNIQUE: Multidetector CT imaging of the right ankle was performed  according to the standard protocol. Multiplanar CT image reconstructions were also generated. COMPARISON:  Radiographs 08/14/2016. FINDINGS: Bones/Joint/Cartilage The ankle is splinted. External fixator passes through the calcaneal tuberosity. The proximal fixator is not visualized. There is a comminuted and moderately displaced fracture of the distal tibial metadiaphysis. Proximally, there is an oblique fracture extending 10 cm proximal to the tibial plafond which is associated with 1.4 cm of residual lateral displacement and approximately 1.7 cm of proximal displacement. There is approximately 20 degrees of apex posterior angulation at this fracture. There is a large butterfly fragment posteriorly, measuring up to 2.5 cm on sagittal image 48/8. Fractures extend into the posterolateral aspect of the tibial plafond. There is no displacement of the articular surface. Comminuted fracture of the distal fibular diaphysis demonstrates near anatomic reduction, although there is approximately 18 degrees of residual apex posterior angulation on the sagittal images. This fracture does not extend into the distal tibiofibular joint. There is no widening of that joint. The ankle mortise is intact. The talus is located and appears intact. There is a small amount of gas lateral to the subtalar joint without definite associated fracture. The additional tarsal bones appear intact. Ligaments Suboptimally assessed by CT. Muscles and Tendons The Achilles tendon appears ruptured approximately 9.7 cm proximal to its calcaneal insertion. There is an approximately 3.6 cm gap in the tendon on sagittal image 36/10. Within the tendon gap, there is a complex fluid collection with multiple air bubbles, measuring up to 3.8 x 1.7 cm on axial image 26/4. There are multiple fracture fragments deep to the disrupted tendon. There are surgical staples along the retracted portions of the tendon. The medial flexor tendons also appear displaced into the  fracture and there integrity is difficult to confirm. They are intact distally and normally located. The peroneal tendons appear intact, although are slightly subluxed laterally from the retromalleolar groove. The anterior extensor tendons are intact. Soft tissues Extensive soft tissue injury consistent with an open fracture. There are multiple air bubbles throughout the soft tissues, with a complex air-fluid collection posterior to the distal tibia, between the retracted portions of the ruptured Achilles tendon. No other focal fluid collections are seen. There is subcutaneous edema throughout the lower leg. IMPRESSION: 1. Comminuted fracture of the distal tibia demonstrates mild residual displacement and angulation. Fracture extends into the posterolateral aspect of the tibial plafond, although the intra-articular portion is not displaced. 2. Near  anatomic reduction of comminuted fracture of the distal fibular diaphysis. 3. No dislocation or tarsal bone injury identified. 4. Complete rupture of the Achilles tendon located at the level of the proximal fracture with associated moderate tendon retraction and a complex air-fluid collection between the retracted portions of the tendon. The medial flexor tendons are also displaced into the tibial fracture and their integrity is difficult to confirm. Electronically Signed   By: Carey Bullocks M.D.   On: 08/16/2017 08:04   Dg Chest Portable 1 View  Result Date: 08/14/2017 CLINICAL DATA:  Struck by lawnmower. EXAM: PORTABLE CHEST 1 VIEW COMPARISON:  Chest radiograph January 22, 2017 FINDINGS: Cardiac silhouette is mildly enlarged, unchanged. Calcified aortic arch, potential ectasia. Status post cardiac valve replacement. Mild bibasilar strandy densities without pleural effusion or focal consolidation. No pneumothorax. Soft tissue planes and included osseous structures are non sub suspicious.Old LEFT rib fracture. IMPRESSION: Bibasilar atelectasis/scarring. Stable  cardiomegaly. Aortic Atherosclerosis (ICD10-I70.0). Electronically Signed   By: Awilda Metro M.D.   On: 08/14/2017 19:15   Dg Tibia/fibula Right Port  Result Date: 08/14/2017 CLINICAL DATA:  84 year old male status post trauma from spinning lung lower blades. EXAM: PORTABLE RIGHT TIBIA AND FIBULA - 2 VIEW COMPARISON:  None. FINDINGS: AP and cross-table lateral views. Severe oblique penetrating trauma through the right tibia and fibula meta diaphyses. Severely comminuted fractures with numerous small bone fragments scattered in the adjacent soft tissues. Severe associated soft tissue injury. Oblique superior to inferior and anterior to posterior trajectory. Mild lateral displacement and medial angulation of the distal fragments. Fracture of the tibia extends toward distal tibia fibular syndesmosis, but appears to stop short of the mortise joint. Right mortise joint remains normally aligned. Talar dome, and malleoli intact. Calcaneus intact. Superimposed calcified peripheral vascular disease. The mid and proximal right tibia and fibula are intact. Maintained normal alignment at the right knee. Visible bones of the right foot appear intact. IMPRESSION: 1. Severe penetrating trauma through the right tibia and fibula at an oblique angle with severely comminuted fractures of both bones and numerous small bone fragments. 2. Distal tibia fracture likely extends into the distal tibia-fibular syndesmosis, but appears to spare the mortise joint. Electronically Signed   By: Odessa Fleming M.D.   On: 08/14/2017 19:21   Dg C-arm 1-60 Min-no Report  Result Date: 08/15/2017 Fluoroscopy was utilized by the requesting physician.  No radiographic interpretation.     Huey Bienenstock M.D on 08/18/2017 at 2:14 PM  Between 7am to 7pm - Pager - [phone]  After 7pm go to [email or web address] - password [facility]  [facility] -  Office  [phone]

## 2017-08-18 NOTE — Progress Notes (Signed)
1700 Pt did not have a BM for 5 days, Dr Randol KernElgergawy notified. Laxatives and suppository was given.

## 2017-08-18 NOTE — Progress Notes (Signed)
Pharmacy Antibiotic Note  Charles KaufmannDavid B Grulke is a 84 y.o. male admitted on 08/14/2017 with open grade 3 right tibia and fibular fractures with complex posterior ankle laceration and laceration of the Achilles tendon requiring surgical intervention.  Pharmacy has been consulted for gentamicin dosing.  D#4 for prophylaxis for open right tibia/tibular fx. Has cement spacer, and delayed bone grafting. S/p OR on 6/21 and 6/22 for I&D, closure of wound, ORIF, wound VAC. May go back to OR on 6/24 with Dr. Carola FrostHandy. Afebrile, WBC down 12.0. no micro  Plan: Continue gentamicin 520 mg IV Q36h Continue cefazolin 1g IV Q8h Follow-up LOT, renal fxn  Height: 6\' 2"  (188 cm) Weight: 170 lb (77.1 kg) IBW/kg (Calculated) : 82.2  Temp (24hrs), Avg:98.3 F (36.8 C), Min:97.8 F (36.6 C), Max:98.4 F (36.9 C)  Recent Labs  Lab 08/14/17 1843 08/14/17 1900 08/15/17 0527 08/15/17 1251 08/16/17 0506 08/17/17 0358  WBC 12.1*  --  13.6*  --  12.9* 12.0*  CREATININE  --  1.10 1.12  --  1.08 0.97  LATICACIDVEN  --   --   --   --  1.3  --   GENTRANDOM  --   --   --  5.5  --   --     Estimated Creatinine Clearance: 55.2 mL/min (by C-G formula based on SCr of 0.97 mg/dL).    No Known Allergies   Thank you for allowing pharmacy to be a part of this patient's care.  Enzo BiNathan Ovid Witman, PharmD, BCPS Clinical Pharmacist Phone number [phone] 08/18/2017 11:48 AM

## 2017-08-18 NOTE — Progress Notes (Signed)
Subjective: 2 Days Post-Op Procedure(s) (LRB): IRRIGATION AND DEBRIDEMENT EXTREMITY (Right) Patient reports pain as mild.  \denies nausea, vomiting  Objective: Vital signs in last 24 hours: Temp:  [97.8 F (36.6 C)-98.4 F (36.9 C)] 97.8 F (36.6 C) (06/23 16100614) Pulse Rate:  [56-66] 56 (06/23 0614) Resp:  [16] 16 (06/22 1749) BP: (104-113)/(72-80) 113/77 (06/23 0614) SpO2:  [94 %-96 %] 94 % (06/23 0614)  Intake/Output from previous day: 06/22 0701 - 06/23 0700 In: 1009.3 [P.O.:480; I.V.:479.3; IV Piggyback:50] Out: 3375 [Urine:3300; Drains:75] Intake/Output this shift: Total I/O In: 240 [P.O.:240] Out: -   Recent Labs    08/16/17 0506 08/17/17 0358  HGB 8.6* 8.5*   Recent Labs    08/16/17 0506 08/17/17 0358  WBC 12.9* 12.0*  RBC 3.67* 3.51*  HCT 27.8* 26.6*  PLT 123* 109*   Recent Labs    08/16/17 0506 08/17/17 0358  NA 137 139  K 3.9 3.9  CL 108 107  CO2 24 27  BUN 14 9  CREATININE 1.08 0.97  GLUCOSE 126* 117*  CALCIUM 7.9* 8.0*   Recent Labs    08/16/17 0506  INR 1.25   EXAM Sensation intact distally to toes Dressing.external fixator intact VAC in place Compartment leg leg soft nt  Anticipated LOS equal to or greater than 2 midnights due to - Age 82 and older with one or more of the following:  - Obesity  - Expected need for hospital services (PT, OT, Nursing) required for safe  discharge  - Anticipated need for postoperative skilled nursing care or inpatient rehab  - Active co-morbidities: None OR   - Unanticipated findings during/Post Surgery: None  - Patient is a high risk of re-admission due to: None     Active Problems:   HTN (hypertension)   HLD (hyperlipidemia)   Hypothyroidism   S/P TAVR (transcatheter aortic valve replacement)   Open right ankle fracture   Assessment/Plan: 2 Days Post-Op Procedure(s) (LRB): IRRIGATION AND DEBRIDEMENT EXTREMITY (Right) Continue foley due to urinary output monitoring  Planned Below Knee  Amputation tomorrow by Dr Glendon AxeHandy-NPO after midnight, consent for surgery  Continue VAC  Charles Patrick 08/18/2017, 12:00 PM  [phone]

## 2017-08-19 ENCOUNTER — Inpatient Hospital Stay (HOSPITAL_COMMUNITY): Payer: Medicare HMO

## 2017-08-19 ENCOUNTER — Inpatient Hospital Stay (HOSPITAL_COMMUNITY): Payer: Medicare HMO | Admitting: Anesthesiology

## 2017-08-19 ENCOUNTER — Encounter (HOSPITAL_COMMUNITY): Payer: Self-pay | Admitting: Anesthesiology

## 2017-08-19 ENCOUNTER — Encounter (HOSPITAL_COMMUNITY)
Admission: EM | Disposition: A | Discharge status: Skilled Nursing Facility | Payer: Self-pay | Source: Home / Self Care | Attending: Orthopedic Surgery

## 2017-08-19 HISTORY — PX: AMPUTATION: SHX166

## 2017-08-19 LAB — POCT I-STAT 7, (LYTES, BLD GAS, ICA,H+H)
Acid-base deficit: 2 mmol/L (ref 0.0–2.0)
Bicarbonate: 23.4 mmol/L (ref 20.0–28.0)
Calcium, Ion: 1.16 mmol/L (ref 1.15–1.40)
HEMATOCRIT: 30 % — AB (ref 39.0–52.0)
Hemoglobin: 10.2 g/dL — ABNORMAL LOW (ref 13.0–17.0)
O2 Saturation: 100 %
POTASSIUM: 3.7 mmol/L (ref 3.5–5.1)
Sodium: 142 mmol/L (ref 135–145)
TCO2: 25 mmol/L (ref 22–32)
pCO2 arterial: 37.8 mmHg (ref 32.0–48.0)
pH, Arterial: 7.394 (ref 7.350–7.450)
pO2, Arterial: 192 mmHg — ABNORMAL HIGH (ref 83.0–108.0)

## 2017-08-19 LAB — CBC
HCT: 30.9 % — ABNORMAL LOW (ref 39.0–52.0)
HEMATOCRIT: 31.1 % — AB (ref 39.0–52.0)
HEMOGLOBIN: 10 g/dL — AB (ref 13.0–17.0)
Hemoglobin: 9.5 g/dL — ABNORMAL LOW (ref 13.0–17.0)
MCH: 24 pg — ABNORMAL LOW (ref 26.0–34.0)
MCH: 23.6 pg — ABNORMAL LOW (ref 26.0–34.0)
MCHC: 32.2 g/dL (ref 30.0–36.0)
MCHC: 30.7 g/dL (ref 30.0–36.0)
MCV: 74.6 fL — ABNORMAL LOW (ref 78.0–100.0)
MCV: 76.9 fL — ABNORMAL LOW (ref 78.0–100.0)
PLATELETS: UNDETERMINED 10*3/uL (ref 150–400)
Platelets: 173 10*3/uL (ref 150–400)
RBC: 4.17 MIL/uL — ABNORMAL LOW (ref 4.22–5.81)
RBC: 4.02 MIL/uL — ABNORMAL LOW (ref 4.22–5.81)
RDW: 20.5 % — ABNORMAL HIGH (ref 11.5–15.5)
RDW: 20.7 % — ABNORMAL HIGH (ref 11.5–15.5)
WBC: 8.7 10*3/uL (ref 4.0–10.5)
WBC: 8.7 10*3/uL (ref 4.0–10.5)

## 2017-08-19 LAB — BASIC METABOLIC PANEL
ANION GAP: 7 (ref 5–15)
BUN: 8 mg/dL (ref 6–20)
CO2: 28 mmol/L (ref 22–32)
Calcium: 8.3 mg/dL — ABNORMAL LOW (ref 8.9–10.3)
Chloride: 105 mmol/L (ref 101–111)
Creatinine, Ser: 0.84 mg/dL (ref 0.61–1.24)
GFR calc Af Amer: >60 mL/min (ref >60)
GFR calc non Af Amer: >60 mL/min (ref >60)
GLUCOSE: 99 mg/dL (ref 65–99)
POTASSIUM: 3.3 mmol/L — AB (ref 3.5–5.1)
Sodium: 140 mmol/L (ref 135–145)

## 2017-08-19 LAB — PREPARE RBC (CROSSMATCH)

## 2017-08-19 SURGERY — AMPUTATION BELOW KNEE
Anesthesia: General | Laterality: Right

## 2017-08-19 MED ORDER — POTASSIUM CHLORIDE 10 MEQ/100ML IV SOLN
10.0000 meq | INTRAVENOUS | Status: AC
  Administered 2017-08-19 (×2): 10 meq via INTRAVENOUS
  Filled 2017-08-19 (×2): qty 100

## 2017-08-19 MED ORDER — FENTANYL CITRATE (PF) 250 MCG/5ML IJ SOLN
INTRAMUSCULAR | Status: DC | PRN
  Administered 2017-08-19 (×2): 50 ug via INTRAVENOUS

## 2017-08-19 MED ORDER — DEXTROSE 5 % IV SOLN
INTRAVENOUS | Status: DC | PRN
  Administered 2017-08-19: 30 ug/min via INTRAVENOUS

## 2017-08-19 MED ORDER — PHENYLEPHRINE 40 MCG/ML (10ML) SYRINGE FOR IV PUSH (FOR BLOOD PRESSURE SUPPORT)
PREFILLED_SYRINGE | INTRAVENOUS | Status: DC | PRN
  Administered 2017-08-19: 120 ug via INTRAVENOUS
  Administered 2017-08-19: 40 ug via INTRAVENOUS
  Administered 2017-08-19: 160 ug via INTRAVENOUS
  Administered 2017-08-19 (×2): 80 ug via INTRAVENOUS

## 2017-08-19 MED ORDER — FENTANYL CITRATE (PF) 100 MCG/2ML IJ SOLN: 25.0000 ug | INTRAMUSCULAR | Status: DC | PRN

## 2017-08-19 MED ORDER — BUPIVACAINE-EPINEPHRINE (PF) 0.25% -1:200000 IJ SOLN
INTRAMUSCULAR | Status: AC
  Filled 2017-08-19: qty 30

## 2017-08-19 MED ORDER — ENOXAPARIN SODIUM 40 MG/0.4ML ~~LOC~~ SOLN
40.0000 mg | SUBCUTANEOUS | Status: DC
  Administered 2017-08-20 – 2017-08-23 (×4): 40 mg via SUBCUTANEOUS
  Filled 2017-08-19 (×4): qty 0.4

## 2017-08-19 MED ORDER — ONDANSETRON HCL 4 MG/2ML IJ SOLN
INTRAMUSCULAR | Status: DC | PRN
  Administered 2017-08-19: 4 mg via INTRAVENOUS

## 2017-08-19 MED ORDER — FENTANYL CITRATE (PF) 100 MCG/2ML IJ SOLN
INTRAMUSCULAR | Status: AC
  Administered 2017-08-19: 25 ug via INTRAVENOUS
  Filled 2017-08-19: qty 2

## 2017-08-19 MED ORDER — EPHEDRINE SULFATE 50 MG/ML IJ SOLN
INTRAMUSCULAR | Status: DC | PRN
  Administered 2017-08-19: 15 mg via INTRAVENOUS
  Administered 2017-08-19: 10 mg via INTRAVENOUS
  Administered 2017-08-19: 5 mg via INTRAVENOUS

## 2017-08-19 MED ORDER — CEFAZOLIN SODIUM-DEXTROSE 1-4 GM/50ML-% IV SOLN
1.0000 g | Freq: Three times a day (TID) | INTRAVENOUS | Status: AC
  Administered 2017-08-19 – 2017-08-20 (×2): 1 g via INTRAVENOUS
  Filled 2017-08-19 (×2): qty 50

## 2017-08-19 MED ORDER — SUGAMMADEX SODIUM 200 MG/2ML IV SOLN
INTRAVENOUS | Status: DC | PRN
  Administered 2017-08-19: 200 mg via INTRAVENOUS

## 2017-08-19 MED ORDER — SODIUM CHLORIDE 0.9 % IJ SOLN
INTRAMUSCULAR | Status: AC
  Filled 2017-08-19: qty 10

## 2017-08-19 MED ORDER — ONDANSETRON HCL 4 MG/2ML IJ SOLN
INTRAMUSCULAR | Status: AC
  Filled 2017-08-19: qty 2

## 2017-08-19 MED ORDER — 0.9 % SODIUM CHLORIDE (POUR BTL) OPTIME
TOPICAL | Status: DC | PRN
  Administered 2017-08-19: 1000 mL

## 2017-08-19 MED ORDER — ROCURONIUM BROMIDE 50 MG/5ML IV SOLN
INTRAVENOUS | Status: AC
  Filled 2017-08-19: qty 1

## 2017-08-19 MED ORDER — GABAPENTIN 300 MG PO CAPS
300.0000 mg | ORAL_CAPSULE | Freq: Two times a day (BID) | ORAL | Status: DC
  Administered 2017-08-19 – 2017-08-23 (×9): 300 mg via ORAL
  Filled 2017-08-19 (×9): qty 1

## 2017-08-19 MED ORDER — FENTANYL CITRATE (PF) 250 MCG/5ML IJ SOLN
INTRAMUSCULAR | Status: AC
  Filled 2017-08-19: qty 5

## 2017-08-19 MED ORDER — EPHEDRINE SULFATE 50 MG/ML IJ SOLN
INTRAMUSCULAR | Status: AC
  Filled 2017-08-19: qty 1

## 2017-08-19 MED ORDER — FENTANYL CITRATE (PF) 100 MCG/2ML IJ SOLN
25.0000 ug | Freq: Once | INTRAMUSCULAR | Status: AC
  Administered 2017-08-19: 25 ug via INTRAVENOUS

## 2017-08-19 MED ORDER — LIDOCAINE-EPINEPHRINE (PF) 1 %-1:200000 IJ SOLN
INTRAMUSCULAR | Status: DC | PRN
  Administered 2017-08-19: 15 mL

## 2017-08-19 MED ORDER — LIDOCAINE 2% (20 MG/ML) 5 ML SYRINGE
INTRAMUSCULAR | Status: DC | PRN
  Administered 2017-08-19: 50 mg via INTRAVENOUS

## 2017-08-19 MED ORDER — LACTATED RINGERS IV SOLN
INTRAVENOUS | Status: DC | PRN
  Administered 2017-08-19 (×2): via INTRAVENOUS

## 2017-08-19 MED ORDER — PROPOFOL 10 MG/ML IV BOLUS
INTRAVENOUS | Status: DC | PRN
  Administered 2017-08-19: 80 mg via INTRAVENOUS

## 2017-08-19 MED ORDER — SUGAMMADEX SODIUM 200 MG/2ML IV SOLN
INTRAVENOUS | Status: AC
  Filled 2017-08-19: qty 2

## 2017-08-19 MED ORDER — FENTANYL CITRATE (PF) 250 MCG/5ML IJ SOLN: INTRAMUSCULAR | Status: DC | PRN

## 2017-08-19 MED ORDER — MIDAZOLAM HCL 2 MG/2ML IJ SOLN
INTRAMUSCULAR | Status: AC
  Filled 2017-08-19: qty 2

## 2017-08-19 MED ORDER — BUPIVACAINE-EPINEPHRINE (PF) 0.5% -1:200000 IJ SOLN
INTRAMUSCULAR | Status: DC | PRN
  Administered 2017-08-19 (×2): 20 mL via PERINEURAL

## 2017-08-19 MED ORDER — ROCURONIUM BROMIDE 10 MG/ML (PF) SYRINGE
PREFILLED_SYRINGE | INTRAVENOUS | Status: DC | PRN
  Administered 2017-08-19: 25 mg via INTRAVENOUS

## 2017-08-19 MED ORDER — LACTATED RINGERS IV SOLN
INTRAVENOUS | Status: DC
  Administered 2017-08-19: 12:00:00 via INTRAVENOUS

## 2017-08-19 SURGICAL SUPPLY — 62 items
BANDAGE ACE 4X5 VEL STRL LF (GAUZE/BANDAGES/DRESSINGS) ×3 IMPLANT
BANDAGE ACE 6X5 VEL STRL LF (GAUZE/BANDAGES/DRESSINGS) ×6 IMPLANT
BANDAGE ESMARK 6X9 LF (GAUZE/BANDAGES/DRESSINGS) IMPLANT
BLADE SAW SAG 73X25 THK (BLADE) ×1
BLADE SAW SGTL 73X25 THK (BLADE) ×2 IMPLANT
BLADE SURG 10 STRL SS (BLADE) ×3 IMPLANT
BNDG COHESIVE 4X5 TAN STRL (GAUZE/BANDAGES/DRESSINGS) ×3 IMPLANT
BNDG ESMARK 6X9 LF (GAUZE/BANDAGES/DRESSINGS)
BNDG GAUZE ELAST 4 BULKY (GAUZE/BANDAGES/DRESSINGS) ×6 IMPLANT
BRUSH SCRUB SURG 4.25 DISP (MISCELLANEOUS) ×6 IMPLANT
COVER MAYO STAND STRL (DRAPES) ×3 IMPLANT
COVER SURGICAL LIGHT HANDLE (MISCELLANEOUS) ×6 IMPLANT
CUFF TOURNIQUET SINGLE 34IN LL (TOURNIQUET CUFF) ×3 IMPLANT
DECANTER SPIKE VIAL GLASS SM (MISCELLANEOUS) ×3 IMPLANT
DRAIN PENROSE 1/2X12 LTX STRL (WOUND CARE) IMPLANT
DRAPE EXTREMITY T 121X128X90 (DRAPE) ×3 IMPLANT
DRAPE HALF SHEET 40X57 (DRAPES) ×9 IMPLANT
DRAPE U-SHAPE 47X51 STRL (DRAPES) ×3 IMPLANT
DRSG ADAPTIC 3X8 NADH LF (GAUZE/BANDAGES/DRESSINGS) ×3 IMPLANT
DRSG MEPITEL 4X7.2 (GAUZE/BANDAGES/DRESSINGS) ×3 IMPLANT
DRSG PAD ABDOMINAL 8X10 ST (GAUZE/BANDAGES/DRESSINGS) ×3 IMPLANT
DRSG VAC ATS MED SENSATRAC (GAUZE/BANDAGES/DRESSINGS) ×3 IMPLANT
DRSG VAC ATS SM SENSATRAC (GAUZE/BANDAGES/DRESSINGS) ×3 IMPLANT
EVACUATOR 1/8 PVC DRAIN (DRAIN) IMPLANT
GAUZE SPONGE 4X4 12PLY STRL (GAUZE/BANDAGES/DRESSINGS) ×3 IMPLANT
GLOVE BIO SURGEON STRL SZ7.5 (GLOVE) ×3 IMPLANT
GLOVE BIO SURGEON STRL SZ8 (GLOVE) ×3 IMPLANT
GLOVE BIOGEL PI IND STRL 7.5 (GLOVE) ×1 IMPLANT
GLOVE BIOGEL PI IND STRL 8 (GLOVE) ×1 IMPLANT
GLOVE BIOGEL PI INDICATOR 7.5 (GLOVE) ×2
GLOVE BIOGEL PI INDICATOR 8 (GLOVE) ×2
GOWN STRL REUS W/ TWL LRG LVL3 (GOWN DISPOSABLE) ×2 IMPLANT
GOWN STRL REUS W/ TWL XL LVL3 (GOWN DISPOSABLE) ×1 IMPLANT
GOWN STRL REUS W/TWL LRG LVL3 (GOWN DISPOSABLE) ×4
GOWN STRL REUS W/TWL XL LVL3 (GOWN DISPOSABLE) ×2
KIT TURNOVER KIT B (KITS) ×3 IMPLANT
MANIFOLD NEPTUNE II (INSTRUMENTS) ×3 IMPLANT
NEEDLE 22X1 1/2 (OR ONLY) (NEEDLE) ×3 IMPLANT
NS IRRIG 1000ML POUR BTL (IV SOLUTION) ×3 IMPLANT
PACK GENERAL/GYN (CUSTOM PROCEDURE TRAY) ×3 IMPLANT
PAD ARMBOARD 7.5X6 YLW CONV (MISCELLANEOUS) ×6 IMPLANT
SPONGE LAP 18X18 X RAY DECT (DISPOSABLE) IMPLANT
STAPLER VISISTAT 35W (STAPLE) IMPLANT
STOCKINETTE IMPERVIOUS LG (DRAPES) ×3 IMPLANT
SUT ETHILON 2 0 FS 18 (SUTURE) IMPLANT
SUT PDS AB 2-0 CT1 27 (SUTURE) IMPLANT
SUT PROLENE 0 CT 1 30 (SUTURE) ×3 IMPLANT
SUT PROLENE 2 0 CT 1 (SUTURE) ×3 IMPLANT
SUT SILK 2 0 (SUTURE)
SUT SILK 2 0 SH CR/8 (SUTURE) ×3 IMPLANT
SUT SILK 2-0 18XBRD TIE 12 (SUTURE) IMPLANT
SUT VIC AB 0 CT1 27 (SUTURE) ×4
SUT VIC AB 0 CT1 27XBRD ANBCTR (SUTURE) ×2 IMPLANT
SUT VIC AB 1 CT1 27 (SUTURE) ×4
SUT VIC AB 1 CT1 27XBRD ANBCTR (SUTURE) ×2 IMPLANT
SUT VIC AB 2-0 CT1 27 (SUTURE) ×4
SUT VIC AB 2-0 CT1 TAPERPNT 27 (SUTURE) ×2 IMPLANT
SYR CONTROL 10ML LL (SYRINGE) ×3 IMPLANT
TOWEL OR 17X24 6PK STRL BLUE (TOWEL DISPOSABLE) ×3 IMPLANT
TOWEL OR 17X26 10 PK STRL BLUE (TOWEL DISPOSABLE) ×6 IMPLANT
TUBE CONNECTING 12'X1/4 (SUCTIONS) ×1
TUBE CONNECTING 12X1/4 (SUCTIONS) ×2 IMPLANT

## 2017-08-19 NOTE — Anesthesia Procedure Notes (Signed)
Anesthesia Regional Block: Popliteal block   Pre-Anesthetic Checklist: ,, timeout performed, Correct Patient, Correct Site, Correct Laterality, Correct Procedure, Correct Position, site marked, Risks and benefits discussed,  Surgical consent,  Pre-op evaluation,  At surgeon's request and post-op pain management  Laterality: Right  Prep: chloraprep       Needles:  Injection technique: Single-shot  Needle Type: Echogenic Needle     Needle Length: 9cm  Needle Gauge: 21     Additional Needles:   Procedures:,,,, ultrasound used (permanent image in chart),,,,  Narrative:  Start time: 08/19/2017 12:55 PM End time: 08/19/2017 1:05 PM Injection made incrementally with aspirations every 5 mL.  Performed by: Personally  Anesthesiologist: Shelton SilvasHollis, Kevin D, MD  Additional Notes: Patient tolerated the procedure well. Local anesthetic introduced in an incremental fashion under minimal resistance after negative aspirations. No paresthesias were elicited. After completion of the procedure, no acute issues were identified and patient continued to be monitored by RN.

## 2017-08-19 NOTE — Anesthesia Procedure Notes (Signed)
Procedure Name: Intubation Date/Time: 08/19/2017 1:33 PM Performed by: Nils PyleBell, Jalexa Pifer T, CRNA Pre-anesthesia Checklist: Patient identified, Emergency Drugs available, Suction available and Patient being monitored Patient Re-evaluated:Patient Re-evaluated prior to induction Oxygen Delivery Method: Circle System Utilized Preoxygenation: Pre-oxygenation with 100% oxygen Induction Type: IV induction Ventilation: Mask ventilation without difficulty and Oral airway inserted - appropriate to patient size Laryngoscope Size: Hyacinth MeekerMiller and 2 Grade View: Grade I Tube type: Oral Tube size: 7.5 mm Number of attempts: 1 Airway Equipment and Method: Stylet and Oral airway Placement Confirmation: ETT inserted through vocal cords under direct vision,  positive ETCO2 and breath sounds checked- equal and bilateral Secured at: 23 cm Tube secured with: Tape Dental Injury: Teeth and Oropharynx as per pre-operative assessment

## 2017-08-19 NOTE — Transfer of Care (Signed)
Immediate Anesthesia Transfer of Care Note  Patient: Charles Patrick  Procedure(s) Performed: AMPUTATION BELOW KNEE (Right )  Patient Location: PACU  Anesthesia Type:GA combined with regional for post-op pain  Level of Consciousness: awake and drowsy  Airway & Oxygen Therapy: Patient Spontanous Breathing and Patient connected to nasal cannula oxygen  Post-op Assessment: Report given to RN and Post -op Vital signs reviewed and stable  Post vital signs: Reviewed and stable  Last Vitals:  Vitals Value Taken Time  BP 99/73 08/19/2017  3:47 PM  Temp    Pulse 79 08/19/2017  3:50 PM  Resp 16 08/19/2017  3:50 PM  SpO2 92 % 08/19/2017  3:50 PM  Vitals shown include unvalidated device data.  Last Pain:  Vitals:   08/19/17 0644  TempSrc: Oral  PainSc:          Complications: No apparent anesthesia complications

## 2017-08-19 NOTE — Progress Notes (Addendum)
Pt platelet results are clumped. Pt scheduled for surgery today. Repeat CBC ordered by [facility]. Ortho informed.

## 2017-08-19 NOTE — Progress Notes (Addendum)
ANTICOAGULATION CONSULT NOTE - Initial Consult  Pharmacy Consult for Lovenox  Indication: VTE prophylaxis  No Known Allergies  Patient Measurements: Height: 6\' 2"  (188 cm) Weight: 170 lb (77.1 kg) IBW/kg (Calculated) : 82.2   Vital Signs: Temp: 98.1 F (36.7 C) (06/24 1651) Temp Source: Oral (06/24 1651) BP: 91/70 (06/24 1651) Pulse Rate: 79 (06/24 1651)  Labs: Recent Labs    08/17/17 0358 08/19/17 0423 08/19/17 0628  HGB 8.5* 10.0* 9.5*  HCT 26.6* 31.1* 30.9*  PLT 109* PLATELET CLUMPS NOTED ON SMEAR, UNABLE TO ESTIMATE 173  CREATININE 0.97 0.84  --     Estimated Creatinine Clearance: 63.7 mL/min (by C-G formula based on SCr of 0.84 mg/dL).   Medical History: Past Medical History:  Diagnosis Date  . Anemia   . Aortic valve stenosis   . Arthropathy   . BPH associated with nocturia   . Carotid artery disease (HCC)   . Colon polyp   . Dilated aortic root (HCC)   . Elevated PSA   . Erectile dysfunction   . Gastric paresis   . Hypercholesterolemia   . Hyperglycemia   . Hypertension    Patient denies  . Hypothyroid   . Iron deficiency   . Pulmonary hypertension (HCC)   . 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 Seizure disorder (HCC)   . Thrombocytopenia (HCC)   . Thyroid nodule   . Tricuspid regurgitation   . Vitamin D deficiency     Medications:  Medications Prior to Admission  Medication Sig Dispense Refill Last Dose  . aspirin EC 81 MG tablet Take 81 mg by mouth daily.    08/14/2017 at Unknown time  . clopidogrel (PLAVIX) 75 MG tablet Take 1 tablet (75 mg total) by mouth daily with breakfast. 30 tablet 5 08/14/2017 at Unknown time  . levothyroxine (SYNTHROID, LEVOTHROID) 50 MCG tablet Take 50 mcg by mouth daily before breakfast.    08/14/2017 at 1000  . Multiple Vitamin (MULTIVITAMIN) capsule Take 1 capsule by mouth daily.    08/14/2017 at Unknown time  . simvastatin (ZOCOR) 40 MG tablet Take 40 mg by mouth daily at 6 PM.    08/13/2017 at Unknown time  . tamsulosin (FLOMAX) 0.4 MG CAPS capsule Take 0.4 mg by mouth daily after supper.    08/13/2017 at Unknown time  . amoxicillin (AMOXIL) 500 MG tablet Take 4 tablets by mouth one hour prior to dental appointments and stomach or bladder procedures 4 tablet 3 Taking   Scheduled:  . acetaminophen  1,000 mg Oral Q8H  . bisacodyl  10 mg Rectal UD  . docusate sodium  100 mg Oral BID  . gabapentin  300 mg Oral BID  . levothyroxine  50 mcg Oral QAC breakfast  . polyethylene glycol  17 g Oral BID  . senna-docusate  2 tablet Oral BID  . simvastatin  40 mg Oral q1800  . 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 Recently s/p I&DRLE, s/p ORIF tibia and fibula fracture and application of wound VAC.  Pharmacy consulted to dose Lovenox SQ for VTE prophylaxis.  He last received Lovenox 30mg  sq on 6/23 @ 08:14.   SCr is 0.84 ( 1.12- 0.84)  CrCl ~ 60 ml/min AET is 15:52  08/19/17.     Plan: Lovenox 40 mg SQ q24 hours for VTE prophylaxis, start tomorrow AM.  Pharmacy will sign off.   Noah Delaine, RPh Clinical Pharmacist 3P-10P [phone] [facility] [phone] 08/19/2017,6:21 PM

## 2017-08-19 NOTE — Care Management Important Message (Signed)
Important Message  Patient Details  Name: Charles Patrick MRN: [id number] Date of Birth: Jul 10, 1924   Medicare Important Message Given:  Yes    [name] 08/19/2017, 4:02 PM

## 2017-08-19 NOTE — Anesthesia Postprocedure Evaluation (Signed)
Anesthesia Post Note  Patient: [name]  Procedure(s) Performed: AMPUTATION BELOW KNEE (Right )     Patient location during evaluation: PACU Anesthesia Type: General and Regional Level of consciousness: awake and alert, oriented and patient cooperative Pain management: pain level controlled Vital Signs Assessment: post-procedure vital signs reviewed and stable Respiratory status: spontaneous breathing, nonlabored ventilation, respiratory function stable and patient connected to nasal cannula oxygen Cardiovascular status: stable and blood pressure returned to baseline Postop Assessment: no apparent nausea or vomiting Anesthetic complications: no    Last Vitals:  Vitals:   08/19/17 1601 08/19/17 1651  BP: 106/75 91/70  Pulse: 81 79  Resp: 14 16  Temp:  36.7 C  SpO2: 97% 99%    Last Pain:  Vitals:   08/19/17 1651  TempSrc: Oral  PainSc:                  [name],[name]

## 2017-08-19 NOTE — Progress Notes (Signed)
Orthopedic Trauma Service Progress Note   Patient ID: Charles KaufmannDavid B Velaquez MRN: [id number] DOB/AGE: 1924-08-11 84 y.o.  Subjective:  Doing ok Discussed plan of care with family and patient again  Pt agreeable for BKA     ROS As above  Objective:   VITALS:   Vitals:   08/18/17 0614 08/18/17 1430 08/18/17 1930 08/19/17 0644  BP: 113/77 137/89 113/82 117/83  Pulse: (!) 56 65 72 73  Resp:  16    Temp: 97.8 F (36.6 C) 98.2 F (36.8 C) 98.9 F (37.2 C) 98 F (36.7 C)  TempSrc: Oral Oral Oral Oral  SpO2: 94% 100% 98% 96%  Weight:      Height:        Estimated body mass index is 21.83 kg/m as calculated from the following:   Height as of this encounter: 6\' 2"  (1.88 m).   Weight as of this encounter: 77.1 kg (170 lb).   Intake/Output      06/23 0701 - 06/24 0700 06/24 0701 - 06/25 0700   P.O. 720    I.V. (mL/kg) 3.2 (0)    IV Piggyback 1824.9    Total Intake(mL/kg) 2548.1 (33)    Urine (mL/kg/hr) 4650 (2.5)    Drains     Stool 0    Total Output 4650    Net -2102         Stool Occurrence 2 x      LABS  Results for orders placed or performed during the hospital encounter of 08/14/17 (from the past 24 hour(s))  CBC     Status: Abnormal   Collection Time: 08/19/17  4:23 AM  Result Value Ref Range   WBC 8.7 4.0 - 10.5 K/uL   RBC 4.17 (L) 4.22 - 5.81 MIL/uL   Hemoglobin 10.0 (L) 13.0 - 17.0 g/dL   HCT 08.631.1 (L) 57.839.0 - 46.952.0 %   MCV 74.6 (L) 78.0 - 100.0 fL   MCH 24.0 (L) 26.0 - 34.0 pg   MCHC 32.2 30.0 - 36.0 g/dL   RDW 62.920.5 (H) 52.811.5 - 41.315.5 %   Platelets PLATELET CLUMPS NOTED ON SMEAR, UNABLE TO ESTIMATE 150 - 400 K/uL  Basic metabolic panel     Status: Abnormal   Collection Time: 08/19/17  4:23 AM  Result Value Ref Range   Sodium 140 135 - 145 mmol/L   Potassium 3.3 (L) 3.5 - 5.1 mmol/L   Chloride 105 101 - 111 mmol/L   CO2 28 22 - 32 mmol/L   Glucose, Bld 99 65 - 99 mg/dL   BUN 8 6 - 20 mg/dL   Creatinine, Ser 2.440.84 0.61 - 1.24  mg/dL   Calcium 8.3 (L) 8.9 - 10.3 mg/dL   GFR calc non Af Amer >60 >60 mL/min   GFR calc Af Amer >60 >60 mL/min   Anion gap 7 5 - 15  CBC     Status: Abnormal   Collection Time: 08/19/17  6:28 AM  Result Value Ref Range   WBC 8.7 4.0 - 10.5 K/uL   RBC 4.02 (L) 4.22 - 5.81 MIL/uL   Hemoglobin 9.5 (L) 13.0 - 17.0 g/dL   HCT 01.030.9 (L) 27.239.0 - 53.652.0 %   MCV 76.9 (L) 78.0 - 100.0 fL   MCH 23.6 (L) 26.0 - 34.0 pg   MCHC 30.7 30.0 - 36.0 g/dL   RDW 64.420.7 (H) 03.411.5 - 74.215.5 %   Platelets 173 150 - 400 K/uL     PHYSICAL EXAM:   Gen: resting  comfortably in bed  Lungs: breathing unlabored Cardiac: regular  Ext:       Right Lower Extremity   Dressing stable  Vac functioning   Ex fix stable  + toe extension  No toe flexion   No sensation on plantar aspect of foot  + sensation on dorsum of foot  Swelling controlled  Ext warm   + DP pulse    Assessment/Plan: 3 Days Post-Op   Active Problems:   HTN (hypertension)   HLD (hyperlipidemia)   Hypothyroidism   S/P TAVR (transcatheter aortic valve replacement)   Open right ankle fracture   Anti-infectives (From admission, onward)   Start     Dose/Rate Route Frequency Ordered Stop   08/16/17 1630  ceFAZolin (ANCEF) IVPB 1 g/50 mL premix     1 g 100 mL/hr over 30 Minutes Intravenous Every 8 hours 08/16/17 1110 08/20/17 1359   08/16/17 1600  gentamicin (GARAMYCIN) 520 mg in dextrose 5 % 100 mL IVPB     520 mg 113 mL/hr over 60 Minutes Intravenous Every 36 hours 08/15/17 1623     08/15/17 2200  ceFAZolin (ANCEF) IVPB 1 g/50 mL premix  Status:  Discontinued     1 g 100 mL/hr over 30 Minutes Intravenous Every 8 hours 08/15/17 1418 08/16/17 1110   08/15/17 0400  ceFAZolin (ANCEF) IVPB 1 g/50 mL premix  Status:  Discontinued     1 g 100 mL/hr over 30 Minutes Intravenous Every 6 hours 08/15/17 0226 08/15/17 1418   08/15/17 0245  gentamicin (GARAMYCIN) 520 mg in dextrose 5 % 100 mL IVPB     520 mg 113 mL/hr over 60 Minutes Intravenous   Once 08/15/17 0231 08/15/17 0356   08/14/17 1845  ceFAZolin (ANCEF) IVPB 1 g/50 mL premix     1 g 100 mL/hr over 30 Minutes Intravenous  Once 08/14/17 1840 08/14/17 2034    .  POD/HD#: 24  84 year old active male with open right distal tibia and fibula fracture with tendon and nerve disruption of the posterior compartments s/p irrigation debridement and external fixation   -Complex open right distal tibia fracture with severe soft tissue and nerve injury s/p external fixation and irrigation debridement, Mangled R leg   OR this afternoon for R BKA   We believe this will enable the pt to get back to normal activities the quickest. Additionally, given the magnitude of injury, salvage is not an appropriate option               - Pain management:             Continue with current regimen   - ABL anemia/Hemodynamics             stable  Will type and cross 2 units of PRBCs for OR today    - Medical issues              Per medical service                         Plavix and aspirin are on hold                         lovenox held for surgery    - DVT/PE prophylaxis:             SCDs             As above   - ID:  Scheduled antibiotics for open fracture/complex soft tissue wound   Will continue ancef for 24 hours post BKA    - FEN/GI prophylaxis/Foley/Lines:             Npo     - Dispo:             OR for R BKA         Mearl Latin, PA-C [facility] [phone] Traci Sermon (C) 08/19/2017, 10:17 AM

## 2017-08-19 NOTE — Plan of Care (Signed)
  Problem: Health Behavior/Discharge Planning: Goal: Ability to manage health-related needs will improve Outcome: Progressing   

## 2017-08-19 NOTE — Brief Op Note (Signed)
08/19/2017  8:15 PM  PATIENT:  Charles Patrick  84 y.o. male  PRE-OPERATIVE DIAGNOSIS:   1. GRADE 3B OPEN RIGHT TIBIA 2. RETAINED EXTERNAL FIXATOR  POST-OPERATIVE DIAGNOSIS:   1. GRADE 3B OPEN RIGHT TIBIA 2. RETAINED EXTERNAL FIXATOR  PROCEDURE:  Procedure(s): 1. AMPUTATION BELOW KNEE (Right) 2. REMOVAL OF EXTERNAL FIXATOR 3. APPLICATION OF SMALL WOUND VAC  SURGEON:  Surgeon(s) and Role:    Myrene Galas* Adalena Abdulla, MD - Primary  PHYSICIAN ASSISTANT: KEITH PAUL,PA-C  ANESTHESIA:   general  EBL:  80 mL   BLOOD ADMINISTERED:none  DRAINS: none   LOCAL MEDICATIONS USED:  NONE  SPECIMEN:  No Specimen  DISPOSITION OF SPECIMEN:  N/A  COUNTS:  YES  TOURNIQUET: Total: Thigh (Right) - 60 minutes  DICTATION: .Other Dictation: Dictation Number [id number]  PLAN OF CARE: Admit to inpatient   PATIENT DISPOSITION:  PACU - hemodynamically stable.   Delay start of Pharmacological VTE agent (>24hrs) due to surgical blood loss or risk of bleeding: no

## 2017-08-19 NOTE — Anesthesia Preprocedure Evaluation (Addendum)
Anesthesia Evaluation  Patient identified by MRN, date of birth, ID band Patient awake    Reviewed: Allergy & Precautions, NPO status , Patient's Chart, lab work & pertinent test results  Airway Mallampati: I  TM Distance: >3 FB Neck ROM: Full    Dental  (+) Dental Advisory Given, Edentulous Upper, Edentulous Lower   Pulmonary    breath sounds clear to auscultation       Cardiovascular hypertension, + Peripheral Vascular Disease   Rhythm:Regular Rate:Normal + Systolic Click    Neuro/Psych Seizures -,  negative psych ROS   GI/Hepatic negative GI ROS, Neg liver ROS,   Endo/Other  Hypothyroidism   Renal/GU negative Renal ROS     Musculoskeletal negative musculoskeletal ROS (+)   Abdominal Normal abdominal exam  (+)   Peds  Hematology   Anesthesia Other Findings   Reproductive/Obstetrics                            Lab Results  Component Value Date   WBC 8.7 08/19/2017   HGB 9.5 (L) 08/19/2017   HCT 30.9 (L) 08/19/2017   MCV 76.9 (L) 08/19/2017   PLT 173 08/19/2017   Lab Results  Component Value Date   CREATININE 0.84 08/19/2017   BUN 8 08/19/2017   NA 140 08/19/2017   K 3.3 (L) 08/19/2017   CL 105 08/19/2017   CO2 28 08/19/2017   Lab Results  Component Value Date   INR 1.25 08/16/2017   INR 1.22 01/22/2017   INR 1.05 01/18/2017   Echo: - Left ventricle: The cavity size was normal. Wall thickness was   increased in a pattern of mild LVH. Systolic function was normal.   The estimated ejection fraction was in the range of 60% to 65%.   Wall motion was normal; there were no regional wall motion   abnormalities. Doppler parameters are consistent with abnormal   left ventricular relaxation (grade 1 diastolic dysfunction). The   E/e&' ratio is between 8-15, suggesting indeterminate LV filing   pressure. - Aortic valve: s/p 29 mm Sapien 3 TAVR. No obstruction. No   paravalvular  leak. Mean gradient (S): 14 mm Hg. Peak gradient   (S): 26 mm Hg. Valve area (VTI): 1.95 cm^2. Valve area (Vmax):   1.86 cm^2. Valve area (Vmean): 1.9 cm^2. - Aorta: Ascending aortic diameter: 48 mm (S). - Ascending aorta: The ascending aorta was moderately dilated. - Mitral valve: Mildly thickened leaflets . There was trivial   regurgitation. - Left atrium: The atrium was normal in size. - Atrial septum: No defect or patent foramen ovale was identified. - Tricuspid valve: There was mild regurgitation. - Pulmonary arteries: PA peak pressure: 40 mm Hg (S). - Inferior vena cava: The vessel was normal in size. The   respirophasic diameter changes were in the normal range (>= 50%),   consistent with normal central venous pressure.  Anesthesia Physical Anesthesia Plan  ASA: III  Anesthesia Plan: General   Post-op Pain Management: GA combined w/ Regional for post-op pain   Induction: Intravenous  PONV Risk Score and Plan: 3 and Ondansetron and Treatment may vary due to age or medical condition  Airway Management Planned: Oral ETT  Additional Equipment: None  Intra-op Plan:   Post-operative Plan: Extubation in OR  Informed Consent: I have reviewed the patients History and Physical, chart, labs and discussed the procedure including the risks, benefits and alternatives for the proposed anesthesia with the patient or  authorized representative who has indicated his/her understanding and acceptance.   Dental advisory given  Plan Discussed with: CRNA  Anesthesia Plan Comments:        Anesthesia Quick Evaluation

## 2017-08-19 NOTE — Anesthesia Procedure Notes (Signed)
Anesthesia Regional Block: Adductor canal block   Pre-Anesthetic Checklist: ,, timeout performed, Correct Patient, Correct Site, Correct Laterality, Correct Procedure, Correct Position, site marked, Risks and benefits discussed,  Surgical consent,  Pre-op evaluation,  At surgeon's request and post-op pain management  Laterality: Right  Prep: chloraprep       Needles:  Injection technique: Single-shot  Needle Type: Echogenic Needle     Needle Length: 9cm  Needle Gauge: 21     Additional Needles:   Procedures:,,,, ultrasound used (permanent image in chart),,,,  Narrative:  Start time: 08/19/2017 1:05 PM End time: 08/19/2017 1:12 PM Injection made incrementally with aspirations every 5 mL.  Performed by: Personally  Anesthesiologist: Shelton SilvasHollis, Lakeem Rozo D, MD  Additional Notes: Patient tolerated the procedure well. Local anesthetic introduced in an incremental fashion under minimal resistance after negative aspirations. No paresthesias were elicited. After completion of the procedure, no acute issues were identified and patient continued to be monitored by RN.

## 2017-08-19 NOTE — Progress Notes (Signed)
PROGRESS NOTE                                                                                                                                                                                                             Patient Demographics:    Charles Patrick, is a 84 y.o. male, DOB - 1924-08-29, AVW:[id number]  Admit date - 08/14/2017   Admitting Physician Toni ArthursJohn Hewitt, MD  Outpatient Primary MD for the patient is [name], Ellery PlunkMichael R, PA-C  LOS - 4   Chief Complaint  Patient presents with  . Extremity Laceration    Cut by Lawnmower       Brief Narrative   84 year old male with history of severe aortic stenosis status post TAVR last November, hyperlipidemia, BPH, chronic anemia, hypothyroidism had injured as right lower extremity with lawnmower .  Patient has significant bleed and x-rays done in the ER revealed fracture of the tibia.  Went for surgical repair by Dr. Cecilie KicksHewit with 08/15/2017, and were consulted for medical management, couple surgeries for looks elevation, but overall poor prognosis, so plan for right BKA today   Subjective:    Charles Abbeavid Uzelac today has, No headache, No chest pain, No abdominal pain -right lower extremity pain is controlled   Assessment  & Plan :    Active Problems:   HTN (hypertension)   HLD (hyperlipidemia)   Hypothyroidism   S/P TAVR (transcatheter aortic valve replacement)   Open right ankle fracture   Right lower extremity injury with tibial and fibula fracture -Treatment per primary orthopedic team, status post irrigation and excisional debridement, with open reduction of tibia and fibula fracture and application of multiplane external fixator and wound VAC.,  Patient injury so significant, will go for right BKA today -Lovenox for DVT prophylaxis, resume after surgery once cleared by Ortho . DVT prophylaxis on Lovenox for today and tomorrow, then will hold after that once cleared by orthopedic, discussed with Dr.  Marcello FennelHande, patient with significant bleed during surgery and still significant blood output through the wound VAC, so we will hold on resuming aspirin and Plavix.  Status post TAVR  -Appears to be euvolemic, continue to monitor closely  Hypothyroidism  -Resume home dose Synthroid  Anemia -Neck microcytic anemia, as well acute blood loss anemia postoperatively, was transfused 1 unit PRBC for hemoglobin of 7.5, with good response, continue  to monitor closely and transfuse for hemoglobin less than 8 point stable at 9.5 today  BPH  -Continue with tamsulosin  Hyperlipidemia  -Continue home dose statin  Continue to hold aspirin and Plavix as high risk of bleeding from his wound as discussed with orthopedic, actually unclear why patient is on both aspirin and Plavix,      Code Status : Full  Family Communication  : none at bedside  Procedures  :  By Dr. Beather Arbour on 08/15/2017 1.  Irrigation and excisional debridement of open right tibia and fibula fractures 2.  Complex closure of right leg wounds totaling 25 cm in length 3.  Open reduction of tibia and fibula fractures 4.  Application of multiplane external fixator 5.  Application of wound VAC    DVT Prophylaxis  : on lovenox, on hold for surgery today  Lab Results  Component Value Date   PLT 173 08/19/2017    Antibiotics  :    Anti-infectives (From admission, onward)   Start     Dose/Rate Route Frequency Ordered Stop   08/16/17 1630  [MAR Hold]  ceFAZolin (ANCEF) IVPB 1 g/50 mL premix     (MAR Hold since Mon 08/19/2017 at 1212. Reason: Transfer to a Procedural area.)   1 g 100 mL/hr over 30 Minutes Intravenous Every 8 hours 08/16/17 1110 08/20/17 1359   08/16/17 1600  gentamicin (GARAMYCIN) 520 mg in dextrose 5 % 100 mL IVPB  Status:  Discontinued     520 mg 113 mL/hr over 60 Minutes Intravenous Every 36 hours 08/15/17 1623 08/19/17 1025   08/15/17 2200  ceFAZolin (ANCEF) IVPB 1 g/50 mL premix  Status:  Discontinued     1  g 100 mL/hr over 30 Minutes Intravenous Every 8 hours 08/15/17 1418 08/16/17 1110   08/15/17 0400  ceFAZolin (ANCEF) IVPB 1 g/50 mL premix  Status:  Discontinued     1 g 100 mL/hr over 30 Minutes Intravenous Every 6 hours 08/15/17 0226 08/15/17 1418   08/15/17 0245  gentamicin (GARAMYCIN) 520 mg in dextrose 5 % 100 mL IVPB     520 mg 113 mL/hr over 60 Minutes Intravenous  Once 08/15/17 0231 08/15/17 0356   08/14/17 1845  ceFAZolin (ANCEF) IVPB 1 g/50 mL premix     1 g 100 mL/hr over 30 Minutes Intravenous  Once 08/14/17 1840 08/14/17 2034        Objective:   Vitals:   08/19/17 1300 08/19/17 1305 08/19/17 1310 08/19/17 1315  BP: 121/85 122/88 111/81 123/69  Pulse: 65 68 69 71  Resp: 14 15 18 12   Temp:      TempSrc:      SpO2: 100% 100% 100% 100%  Weight:      Height:        Wt Readings from Last 3 Encounters:  08/14/17 77.1 kg (170 lb)  02/13/17 78.7 kg (173 lb 8 oz)  01/30/17 79.4 kg (175 lb)     Intake/Output Summary (Last 24 hours) at 08/19/2017 1405 Last data filed at 08/19/2017 1100 Gross per 24 hour  Intake 2308.05 ml  Output 5000 ml  Net -2691.95 ml     Physical Exam  Awake Alert, Oriented X 3, in bed in no apparent distress, right eye blindness Symmetrical Chest wall movement, Good air movement bilaterally, Right lower extremity bandaged, with external fixation device applied, with wound VAC as well.    Data Review:    CBC Recent Labs  Lab 08/14/17 1843  08/15/17 1610  08/16/17 0506 08/17/17 0358 08/19/17 0423 08/19/17 0628  WBC 12.1*  --  13.6* 12.9* 12.0* 8.7 8.7  HGB 10.2*   < > 7.5* 8.6* 8.5* 10.0* 9.5*  HCT 34.2*   < > 25.4* 27.8* 26.6* 31.1* 30.9*  PLT 206  --  142* 123* 109* PLATELET CLUMPS NOTED ON SMEAR, UNABLE TO ESTIMATE 173  MCV 71.5*  --  72.0* 75.7* 75.8* 74.6* 76.9*  MCH 21.3*  --  21.2* 23.4* 24.2* 24.0* 23.6*  MCHC 29.8*  --  29.5* 30.9 32.0 32.2 30.7  RDW 17.2*  --  17.2* 19.5* 19.7* 20.5* 20.7*  LYMPHSABS 4.1*  --   --   --    --   --   --   MONOABS 0.8  --   --   --   --   --   --   EOSABS 0.3  --   --   --   --   --   --   BASOSABS 0.1  --   --   --   --   --   --    < > = values in this interval not displayed.    Chemistries  Recent Labs  Lab 08/14/17 1900 08/14/17 2212 08/15/17 0527 08/16/17 0506 08/17/17 0358 08/19/17 0423  NA 143 142  --  137 139 140  K 3.6 3.7  --  3.9 3.9 3.3*  CL 107  --   --  108 107 105  CO2  --   --   --  24 27 28   GLUCOSE 151*  --   --  126* 117* 99  BUN 15  --   --  14 9 8   CREATININE 1.10  --  1.12 1.08 0.97 0.84  CALCIUM  --   --   --  7.9* 8.0* 8.3*  AST  --   --   --  21  --   --   ALT  --   --   --  13*  --   --   ALKPHOS  --   --   --  49  --   --   BILITOT  --   --   --  0.5  --   --    ------------------------------------------------------------------------------------------------------------------ No results for input(s): CHOL, HDL, LDLCALC, TRIG, CHOLHDL, LDLDIRECT in the last 72 hours.  Lab Results  Component Value Date   HGBA1C 5.6 01/18/2017   ------------------------------------------------------------------------------------------------------------------ No results for input(s): TSH, T4TOTAL, T3FREE, THYROIDAB in the last 72 hours.  Invalid input(s): FREET3 ------------------------------------------------------------------------------------------------------------------ No results for input(s): VITAMINB12, FOLATE, FERRITIN, TIBC, IRON, RETICCTPCT in the last 72 hours.  Coagulation profile Recent Labs  Lab 08/16/17 0506  INR 1.25    No results for input(s): DDIMER in the last 72 hours.  Cardiac Enzymes No results for input(s): CKMB, TROPONINI, MYOGLOBIN in the last 168 hours.  Invalid input(s): CK ------------------------------------------------------------------------------------------------------------------    Component Value Date/Time   BNP 109.0 (H) 01/18/2017 1131    Inpatient Medications  Scheduled Meds: . [MAR Hold]  acetaminophen  1,000 mg Oral Q8H  . [MAR Hold] bisacodyl  10 mg Rectal UD  . [MAR Hold] docusate sodium  100 mg Oral BID  . [MAR Hold] levothyroxine  50 mcg Oral QAC breakfast  . polyethylene glycol  17 g Oral BID  . [MAR Hold] senna-docusate  2 tablet Oral BID  . [MAR Hold] simvastatin  40 mg Oral q1800  . [MAR Hold] tamsulosin  0.4 mg Oral  QPC supper   Continuous Infusions: . sodium chloride Stopped (08/17/17 0800)  . [MAR Hold] sodium chloride 10 mL/hr at 08/18/17 2325  . [MAR Hold]  ceFAZolin (ANCEF) IV 1 g (08/19/17 0757)  . lactated ringers 10 mL/hr at 08/19/17 1227  . [MAR Hold] methocarbamol (ROBAXIN)  IV     PRN Meds:.[MAR Hold] sodium chloride, [MAR Hold] diphenhydrAMINE, [MAR Hold]  HYDROmorphone (DILAUDID) injection, [MAR Hold] methocarbamol **OR** [MAR Hold] methocarbamol (ROBAXIN)  IV, [MAR Hold] ondansetron **OR** [MAR Hold] ondansetron (ZOFRAN) IV, [MAR Hold] oxyCODONE, [MAR Hold] oxyCODONE  Micro Results Recent Results (from the past 240 hour(s))  Surgical PCR screen     Status: None   Collection Time: 08/15/17  7:48 AM  Result Value Ref Range Status   MRSA, PCR NEGATIVE NEGATIVE Final   Staphylococcus aureus NEGATIVE NEGATIVE Final    Comment: (NOTE) The Xpert SA Assay (FDA approved for NASAL specimens in patients 66 years of age and older), is one component of a comprehensive surveillance program. It is not intended to diagnose infection nor to guide or monitor treatment. Performed at [facility], [address], [place], [place] [address]     Radiology Reports Dg Tibia/fibula Right  Result Date: 08/15/2017 CLINICAL DATA:  External fixation of right ankle with irrigation debridement and application of wound VAC. EXAM: RIGHT TIBIA AND FIBULA - 2 VIEW COMPARISON:  Preoperative radiographs from 08/14/2017 FINDINGS: Five C-arm fluoroscopic views demonstrate external fixation across an open fracture of the distal tibial and fibular diaphysis. Fine bony  detail is somewhat limited due to the fluoroscopic technique. A total of 9 seconds of fluoroscopic time was utilized. IMPRESSION: C-arm fluoroscopic views demonstrates placement of an external fixation device across a known comminuted open fracture of the distal tibia and fibula. Electronically Signed   By: Tollie Eth M.D.   On: 08/15/2017 01:46   Ct Ankle Right Wo Contrast  Result Date: 08/16/2017 CLINICAL DATA:  Right ankle fracture post external fixation. EXAM: CT OF THE RIGHT ANKLE WITHOUT CONTRAST TECHNIQUE: Multidetector CT imaging of the right ankle was performed according to the standard protocol. Multiplanar CT image reconstructions were also generated. COMPARISON:  Radiographs 08/14/2016. FINDINGS: Bones/Joint/Cartilage The ankle is splinted. External fixator passes through the calcaneal tuberosity. The proximal fixator is not visualized. There is a comminuted and moderately displaced fracture of the distal tibial metadiaphysis. Proximally, there is an oblique fracture extending 10 cm proximal to the tibial plafond which is associated with 1.4 cm of residual lateral displacement and approximately 1.7 cm of proximal displacement. There is approximately 20 degrees of apex posterior angulation at this fracture. There is a large butterfly fragment posteriorly, measuring up to 2.5 cm on sagittal image 48/8. Fractures extend into the posterolateral aspect of the tibial plafond. There is no displacement of the articular surface. Comminuted fracture of the distal fibular diaphysis demonstrates near anatomic reduction, although there is approximately 18 degrees of residual apex posterior angulation on the sagittal images. This fracture does not extend into the distal tibiofibular joint. There is no widening of that joint. The ankle mortise is intact. The talus is located and appears intact. There is a small amount of gas lateral to the subtalar joint without definite associated fracture. The additional tarsal  bones appear intact. Ligaments Suboptimally assessed by CT. Muscles and Tendons The Achilles tendon appears ruptured approximately 9.7 cm proximal to its calcaneal insertion. There is an approximately 3.6 cm gap in the tendon on sagittal image 36/10. Within the tendon gap,  there is a complex fluid collection with multiple air bubbles, measuring up to 3.8 x 1.7 cm on axial image 26/4. There are multiple fracture fragments deep to the disrupted tendon. There are surgical staples along the retracted portions of the tendon. The medial flexor tendons also appear displaced into the fracture and there integrity is difficult to confirm. They are intact distally and normally located. The peroneal tendons appear intact, although are slightly subluxed laterally from the retromalleolar groove. The anterior extensor tendons are intact. Soft tissues Extensive soft tissue injury consistent with an open fracture. There are multiple air bubbles throughout the soft tissues, with a complex air-fluid collection posterior to the distal tibia, between the retracted portions of the ruptured Achilles tendon. No other focal fluid collections are seen. There is subcutaneous edema throughout the lower leg. IMPRESSION: 1. Comminuted fracture of the distal tibia demonstrates mild residual displacement and angulation. Fracture extends into the posterolateral aspect of the tibial plafond, although the intra-articular portion is not displaced. 2. Near anatomic reduction of comminuted fracture of the distal fibular diaphysis. 3. No dislocation or tarsal bone injury identified. 4. Complete rupture of the Achilles tendon located at the level of the proximal fracture with associated moderate tendon retraction and a complex air-fluid collection between the retracted portions of the tendon. The medial flexor tendons are also displaced into the tibial fracture and their integrity is difficult to confirm. Electronically Signed   By: Carey Bullocks M.D.    On: 08/16/2017 08:04   Dg Chest Portable 1 View  Result Date: 08/14/2017 CLINICAL DATA:  Struck by lawnmower. EXAM: PORTABLE CHEST 1 VIEW COMPARISON:  Chest radiograph January 22, 2017 FINDINGS: Cardiac silhouette is mildly enlarged, unchanged. Calcified aortic arch, potential ectasia. Status post cardiac valve replacement. Mild bibasilar strandy densities without pleural effusion or focal consolidation. No pneumothorax. Soft tissue planes and included osseous structures are non sub suspicious.Old LEFT rib fracture. IMPRESSION: Bibasilar atelectasis/scarring. Stable cardiomegaly. Aortic Atherosclerosis (ICD10-I70.0). Electronically Signed   By: Awilda Metro M.D.   On: 08/14/2017 19:15   Dg Tibia/fibula Right Port  Result Date: 08/14/2017 CLINICAL DATA:  84 year old male status post trauma from spinning lung lower blades. EXAM: PORTABLE RIGHT TIBIA AND FIBULA - 2 VIEW COMPARISON:  None. FINDINGS: AP and cross-table lateral views. Severe oblique penetrating trauma through the right tibia and fibula meta diaphyses. Severely comminuted fractures with numerous small bone fragments scattered in the adjacent soft tissues. Severe associated soft tissue injury. Oblique superior to inferior and anterior to posterior trajectory. Mild lateral displacement and medial angulation of the distal fragments. Fracture of the tibia extends toward distal tibia fibular syndesmosis, but appears to stop short of the mortise joint. Right mortise joint remains normally aligned. Talar dome, and malleoli intact. Calcaneus intact. Superimposed calcified peripheral vascular disease. The mid and proximal right tibia and fibula are intact. Maintained normal alignment at the right knee. Visible bones of the right foot appear intact. IMPRESSION: 1. Severe penetrating trauma through the right tibia and fibula at an oblique angle with severely comminuted fractures of both bones and numerous small bone fragments. 2. Distal tibia fracture  likely extends into the distal tibia-fibular syndesmosis, but appears to spare the mortise joint. Electronically Signed   By: Odessa Fleming M.D.   On: 08/14/2017 19:21   Dg C-arm 1-60 Min-no Report  Result Date: 08/15/2017 Fluoroscopy was utilized by the requesting physician.  No radiographic interpretation.     Huey Bienenstock M.D on 08/19/2017 at 2:05 PM  Between 7am  to 7pm - Pager - [phone]  After 7pm go to [email or web address] - password [facility]  [facility] -  Office  [phone]

## 2017-08-20 ENCOUNTER — Encounter (HOSPITAL_COMMUNITY): Payer: Self-pay | Admitting: Orthopedic Surgery

## 2017-08-20 DIAGNOSIS — D649 Anemia, unspecified: Secondary | ICD-10-CM

## 2017-08-20 LAB — CBC
HCT: 27.5 % — ABNORMAL LOW (ref 39.0–52.0)
HEMOGLOBIN: 8.6 g/dL — AB (ref 13.0–17.0)
MCH: 23.7 pg — AB (ref 26.0–34.0)
MCHC: 31.3 g/dL (ref 30.0–36.0)
MCV: 75.8 fL — ABNORMAL LOW (ref 78.0–100.0)
Platelets: 152 10*3/uL (ref 150–400)
RBC: 3.63 MIL/uL — AB (ref 4.22–5.81)
RDW: 20.9 % — ABNORMAL HIGH (ref 11.5–15.5)
WBC: 9.8 10*3/uL (ref 4.0–10.5)

## 2017-08-20 LAB — BASIC METABOLIC PANEL
Anion gap: 6 (ref 5–15)
BUN: 9 mg/dL (ref 8–23)
CHLORIDE: 101 mmol/L (ref 98–111)
CO2: 29 mmol/L (ref 22–32)
Calcium: 8.1 mg/dL — ABNORMAL LOW (ref 8.9–10.3)
Creatinine, Ser: 1.04 mg/dL (ref 0.61–1.24)
GFR calc Af Amer: >60 mL/min (ref >60)
GFR calc non Af Amer: >60 mL/min (ref >60)
Glucose, Bld: 116 mg/dL — ABNORMAL HIGH (ref 70–99)
POTASSIUM: 4.2 mmol/L (ref 3.5–5.1)
SODIUM: 136 mmol/L (ref 135–145)

## 2017-08-20 NOTE — Op Note (Signed)
NAME: Charles Patrick, Charles Patrick MEDICAL RECORD [id number] ACCOUNT [id number] DATE OF BIRTH:Mar 15, 1924 FACILITY: [facility] LOCATION: [facility] PHYSICIAN:Freada Twersky H. Golden Emile, MD  OPERATIVE REPORT  DATE OF PROCEDURE:  08/19/2017  PREOPERATIVE DIAGNOSES: 1.  Grade IIIB open right tibia. 2.  Retained external fixator, right ankle.  POSTOPERATIVE DIAGNOSES:   1.  Grade IIIB open right tibia. 2.  Retained external fixator, right ankle.  PROCEDURE: 1.  Removal of external fixator, right ankle. 2.  Right below knee amputation. 3.  Application of small wound vac.  SURGEON:  Myrene Galas, MD.  ASSISTANT:  Montez Morita, PA-C.  ANESTHESIA:  General.  COMPLICATIONS:  None.  TOURNIQUET:  Approximately 60 minutes.  Please refer to the anesthesia record as the tourniquet was deflated and reinflated.  PATIENT DISPOSITION:  To PACU.  CONDITION:  Stable.  INDICATIONS:  The patient is a very pleasant 84 year old male who is very active outside.  He sustained a crushing injury to his right leg resulting in a comminuted open fracture that was associated with disruption of his posterior tibial artery, his  posterior tibial nerve, a skin defect greater than 5 cm x 5 cm, a muscle defect approximating 6 inches, loss of his flexor and Achilles tendon and a bone defect greater than 6 cm that was primarily on the tibial side and not completely circumferential.   The patient underwent aggressive I and D by Dr. Toni Arthurs on the night of presentation.  He had subsequent I and D at which time the leg was deemed unsalvageable given the combined injuries.  I have discussed this multiple times with the patient as  well as the family and they do consent to proceed with amputation at this time, understanding that to result in the least morbid outcome with the greatest potential for function as well.  Risks discussed included phantom pain, infection, other nerve  injury, other vessel injury, bleeding, loss of motion, DVT, PE,  heart attack, stroke and others.  Again, they did consent to proceed.  SUMMARY OF PROCEDURE:  The patient was given preoperative antibiotics, taken to the operating room where general anesthesia was induced.  His right lower extremity underwent a removal of the external fixator after anesthesia.  There was some mild  ulceration noted of the pin sites in the anterior cortex of the tibia.  A standard Betadine scrub and paint was then performed and draped using an impervious stockinette over the foot with Coban.  Incision was marked so as to avoid the pin tract sites  anteriorly.  A standard posterior based flap was used.  The leg was elevated, exsanguinated proximal to the area of injury with Esmarch bandage and then a tourniquet inflated to 300 mmHg.  We began by making the skin and fascial incision.  I then went  through the anterior compartment clamping the anterior vessels.  We also incised through the lateral compartment and exposed the posterior tibia medially placing a Bennett underneath the tibia from that side and a Cobb from the lateral side then  performing the cut and beveling the near cortex as well.  This was followed by use of the amputation knife through the posterior tissues.  Clamps were used to control the posterior vessels.  I then divided the anterior tibial artery, vein and nerve,  ligating those separately 2 times for the arteries and once for the vein. The nerves were injected within the sheath around the nerve with Marcaine with epinephrine and then cut sharply deep within the tissues.  Similarly for the  posterior compartment,  this was performed, isolating the posterior tibial artery and nerve.  The nerve was ligated because of the large vaso vasorum and then cut proximal to the tibia.  The saphenous nerve and sural nerve were also identified and injected within the sheath  and then cut while short of the closure deep within the muscle belly.  Some stick ties were used on the venous  branching within the soleus.  The muscle was well perfused.  There are some areas of previous injury identified, but no contamination of any  type.  Tourniquet was deflated.  Hemostasis was obtained.  Some irrigation performed at that point.  Additional irrigation will be performed later.  After we were satisfied with hemostasis, the leg was inflated and additional time put on the tourniquet  for closure.  This was performed using a #1 for the deep tissues and flap and then a 2-0 Vicryl and 2-0 nylon for the skin.  A sterile gently compressive dressing was applied with an incisional VAC because of the patient's traumatic injury and to  facilitate healing.  After the Kerlix and Ace bandage, he was then placed in a knee immobilizer with a padding underneath the stump to keep him in full extension.  The patient was taken to the PACU in stable condition.  Montez MoritaKeith Paul, PA-C, assisted me  throughout and his assistance was necessary for the safe and expeditious treatment of the patient as we did work simultaneously with closure and he allowed for continued adequate control of the neurovascular bundles.  PROGNOSIS:  The patient will have his dressing changed in the next 72 hours.  We anticipate immediate application of a stump shrinker.  He will be allowed unrestricted range of motion and a prosthetic fitting.  We hope to be around 6 weeks because of his  age to return to full function.  It will be very difficult, but his eagerness to do so is encouraging.  TN/NUANCE  D:08/19/2017 T:08/20/2017 JOB:[id number]

## 2017-08-20 NOTE — Care Management (Signed)
84 yr old gentleman admitted with right open tibia fracture, initially had external fixator to right ankle , patient went to OR on 08/19/17 for removal of external fixator and underwent a right below the knee amputation. Case manager will continue to follow patient for disposition needs.

## 2017-08-20 NOTE — Progress Notes (Signed)
PROGRESS NOTE                                                                                                                                                                                                             Patient Demographics:    Charles Patrick, is a 84 y.o. male, DOB - 01-28-1925, RUE:[id number]  Admit date - 08/14/2017   Admitting Physician Toni ArthursJohn Hewitt, MD  Outpatient Primary MD for the patient is [name], Ellery PlunkMichael R, PA-C  LOS - 5   Chief Complaint  Patient presents with  . Extremity Laceration    Cut by Lawnmower       Brief Narrative   84 year old male with history of severe aortic stenosis status post TAVR last November, hyperlipidemia, BPH, chronic anemia, hypothyroidism had injured as right lower extremity with lawnmower .  Patient has significant bleed and x-rays done in the ER revealed fracture of the tibia.  Went for surgical repair by Dr. Cecilie KicksHewit with 08/15/2017, and were consulted for medical management, couple surgeries for looks elevation, but overall poor prognosis, so plan for right BKA today   Subjective:    Charles Abbeavid Schnorr today has, No headache, No chest pain, No abdominal pain -right lower extremity pain is controlled   Assessment  & Plan :    Active Problems:   HTN (hypertension)   HLD (hyperlipidemia)   Hypothyroidism   S/P TAVR (transcatheter aortic valve replacement)   Open right ankle fracture   Right lower extremity injury with tibial and fibula fracture -Treatment per primary orthopedic team, status post irrigation and excisional debridement, with open reduction of tibia and fibula fracture and application of multiplane external fixator and wound VAC.,  Patient injury so significant, limb salvage is very hard to achieve, went for right BKA by Dr. Marcello FennelHande on 08/19/2017 -Management and DVT prophylaxis per primary orthopedic team.  Status post TAVR  -Appears to be euvolemic, continue to monitor  closely  Hypothyroidism  -Resume home dose Synthroid  Anemia -Neck microcytic anemia, as well acute blood loss anemia postoperatively, was transfused 1 unit PRBC for hemoglobin of 7.5, with good response, continue to monitor closely and transfuse for hemoglobin less than 8,.  BPH  -Continue with tamsulosin  Hyperlipidemia  -Continue home dose statin  Continue to hold aspirin and Plavix as high risk of bleeding from his wound as  discussed with orthopedic, actually unclear why patient is on both aspirin and Plavix,      Code Status : Full  Family Communication  : none at bedside  Procedures  :  By Dr. Beather Arbour on 08/15/2017 1.  Irrigation and excisional debridement of open right tibia and fibula fractures 2.  Complex closure of right leg wounds totaling 25 cm in length 3.  Open reduction of tibia and fibula fractures 4.  Application of multiplane external fixator 5.  Application of wound VAC  Dr. [name] on 08/19/2017 PROCEDURE: 1.  Removal of external fixator, right ankle. 2.  Right below knee amputation.     DVT Prophylaxis  : on lovenox,   Lab Results  Component Value Date   PLT 152 08/20/2017    Antibiotics  :    Anti-infectives (From admission, onward)   Start     Dose/Rate Route Frequency Ordered Stop   08/19/17 2200  ceFAZolin (ANCEF) IVPB 1 g/50 mL premix     1 g 100 mL/hr over 30 Minutes Intravenous Every 8 hours 08/19/17 1713 08/20/17 0620   08/16/17 1630  ceFAZolin (ANCEF) IVPB 1 g/50 mL premix  Status:  Discontinued     1 g 100 mL/hr over 30 Minutes Intravenous Every 8 hours 08/16/17 1110 08/19/17 1714   08/16/17 1600  gentamicin (GARAMYCIN) 520 mg in dextrose 5 % 100 mL IVPB  Status:  Discontinued     520 mg 113 mL/hr over 60 Minutes Intravenous Every 36 hours 08/15/17 1623 08/19/17 1025   08/15/17 2200  ceFAZolin (ANCEF) IVPB 1 g/50 mL premix  Status:  Discontinued     1 g 100 mL/hr over 30 Minutes Intravenous Every 8 hours 08/15/17 1418 08/16/17  1110   08/15/17 0400  ceFAZolin (ANCEF) IVPB 1 g/50 mL premix  Status:  Discontinued     1 g 100 mL/hr over 30 Minutes Intravenous Every 6 hours 08/15/17 0226 08/15/17 1418   08/15/17 0245  gentamicin (GARAMYCIN) 520 mg in dextrose 5 % 100 mL IVPB     520 mg 113 mL/hr over 60 Minutes Intravenous  Once 08/15/17 0231 08/15/17 0356   08/14/17 1845  ceFAZolin (ANCEF) IVPB 1 g/50 mL premix     1 g 100 mL/hr over 30 Minutes Intravenous  Once 08/14/17 1840 08/14/17 2034        Objective:   Vitals:   08/19/17 2326 08/20/17 0513 08/20/17 0907 08/20/17 1359  BP: 105/74 (!) 89/73 116/73 101/67  Pulse: 86 80  93  Resp:  15  14  Temp: 99.3 F (37.4 C) 98.8 F (37.1 C)  (!) 100.8 F (38.2 C)  TempSrc: Oral Oral  Oral  SpO2: 96% 97%  94%  Weight:      Height:        Wt Readings from Last 3 Encounters:  08/14/17 77.1 kg (170 lb)  02/13/17 78.7 kg (173 lb 8 oz)  01/30/17 79.4 kg (175 lb)     Intake/Output Summary (Last 24 hours) at 08/20/2017 1433 Last data filed at 08/20/2017 0830 Gross per 24 hour  Intake 1140 ml  Output 1350 ml  Net -210 ml     Physical Exam  Awake Alert, Oriented X 3, in bed in no apparent distress, right eye blindness Good air entry bilaterally, no wheezing Regular rate and rhythm, no rubs murmurs gallops Soft, nontender, nondistended Right BKA, with wound VAC   Data Review:    CBC Recent Labs  Lab 08/14/17 1843  08/16/17 0506  08/17/17 0358 08/19/17 0423 08/19/17 0628 08/20/17 0655  WBC 12.1*   < > 12.9* 12.0* 8.7 8.7 9.8  HGB 10.2*   < > 8.6* 8.5* 10.0* 9.5* 8.6*  HCT 34.2*   < > 27.8* 26.6* 31.1* 30.9* 27.5*  PLT 206   < > 123* 109* PLATELET CLUMPS NOTED ON SMEAR, UNABLE TO ESTIMATE 173 152  MCV 71.5*   < > 75.7* 75.8* 74.6* 76.9* 75.8*  MCH 21.3*   < > 23.4* 24.2* 24.0* 23.6* 23.7*  MCHC 29.8*   < > 30.9 32.0 32.2 30.7 31.3  RDW 17.2*   < > 19.5* 19.7* 20.5* 20.7* 20.9*  LYMPHSABS 4.1*  --   --   --   --   --   --   MONOABS 0.8  --   --    --   --   --   --   EOSABS 0.3  --   --   --   --   --   --   BASOSABS 0.1  --   --   --   --   --   --    < > = values in this interval not displayed.    Chemistries  Recent Labs  Lab 08/14/17 1900 08/14/17 2212 08/15/17 0527 08/16/17 0506 08/17/17 0358 08/19/17 0423 08/20/17 0655  NA 143 142  --  137 139 140 136  K 3.6 3.7  --  3.9 3.9 3.3* 4.2  CL 107  --   --  108 107 105 101  CO2  --   --   --  24 27 28 29   GLUCOSE 151*  --   --  126* 117* 99 116*  BUN 15  --   --  14 9 8 9   CREATININE 1.10  --  1.12 1.08 0.97 0.84 1.04  CALCIUM  --   --   --  7.9* 8.0* 8.3* 8.1*  AST  --   --   --  21  --   --   --   ALT  --   --   --  13*  --   --   --   ALKPHOS  --   --   --  49  --   --   --   BILITOT  --   --   --  0.5  --   --   --    ------------------------------------------------------------------------------------------------------------------ No results for input(s): CHOL, HDL, LDLCALC, TRIG, CHOLHDL, LDLDIRECT in the last 72 hours.  Lab Results  Component Value Date   HGBA1C 5.6 01/18/2017   ------------------------------------------------------------------------------------------------------------------ No results for input(s): TSH, T4TOTAL, T3FREE, THYROIDAB in the last 72 hours.  Invalid input(s): FREET3 ------------------------------------------------------------------------------------------------------------------ No results for input(s): VITAMINB12, FOLATE, FERRITIN, TIBC, IRON, RETICCTPCT in the last 72 hours.  Coagulation profile Recent Labs  Lab 08/16/17 0506  INR 1.25    No results for input(s): DDIMER in the last 72 hours.  Cardiac Enzymes No results for input(s): CKMB, TROPONINI, MYOGLOBIN in the last 168 hours.  Invalid input(s): CK ------------------------------------------------------------------------------------------------------------------    Component Value Date/Time   BNP 109.0 (H) 01/18/2017 1131    Inpatient  Medications  Scheduled Meds: . acetaminophen  1,000 mg Oral Q8H  . bisacodyl  10 mg Rectal UD  . docusate sodium  100 mg Oral BID  . enoxaparin (LOVENOX) injection  40 mg Subcutaneous Q24H  . gabapentin  300 mg Oral BID  . levothyroxine  50 mcg Oral QAC breakfast  .  senna-docusate  2 tablet Oral BID  . simvastatin  40 mg Oral q1800  . tamsulosin  0.4 mg Oral QPC supper   Continuous Infusions: . sodium chloride Stopped (08/17/17 0800)  . sodium chloride 10 mL/hr at 08/20/17 0910  . lactated ringers 10 mL/hr at 08/19/17 1227  . methocarbamol (ROBAXIN)  IV     PRN Meds:.sodium chloride, diphenhydrAMINE, HYDROmorphone (DILAUDID) injection, methocarbamol **OR** methocarbamol (ROBAXIN)  IV, ondansetron **OR** ondansetron (ZOFRAN) IV, oxyCODONE, oxyCODONE  Micro Results Recent Results (from the past 240 hour(s))  Surgical PCR screen     Status: None   Collection Time: 08/15/17  7:48 AM  Result Value Ref Range Status   MRSA, PCR NEGATIVE NEGATIVE Final   Staphylococcus aureus NEGATIVE NEGATIVE Final    Comment: (NOTE) The Xpert SA Assay (FDA approved for NASAL specimens in patients 72 years of age and older), is one component of a comprehensive surveillance program. It is not intended to diagnose infection nor to guide or monitor treatment. Performed at [facility], [address] [place], [place] [address]     Radiology Reports Dg Tibia/fibula Right  Result Date: 08/15/2017 CLINICAL DATA:  External fixation of right ankle with irrigation debridement and application of wound VAC. EXAM: RIGHT TIBIA AND FIBULA - 2 VIEW COMPARISON:  Preoperative radiographs from 08/14/2017 FINDINGS: Five C-arm fluoroscopic views demonstrate external fixation across an open fracture of the distal tibial and fibular diaphysis. Fine bony detail is somewhat limited due to the fluoroscopic technique. A total of 9 seconds of fluoroscopic time was utilized. IMPRESSION: C-arm fluoroscopic views  demonstrates placement of an external fixation device across a known comminuted open fracture of the distal tibia and fibula. Electronically Signed   By: Tollie Eth M.D.   On: 08/15/2017 01:46   Ct Ankle Right Wo Contrast  Result Date: 08/16/2017 CLINICAL DATA:  Right ankle fracture post external fixation. EXAM: CT OF THE RIGHT ANKLE WITHOUT CONTRAST TECHNIQUE: Multidetector CT imaging of the right ankle was performed according to the standard protocol. Multiplanar CT image reconstructions were also generated. COMPARISON:  Radiographs 08/14/2016. FINDINGS: Bones/Joint/Cartilage The ankle is splinted. External fixator passes through the calcaneal tuberosity. The proximal fixator is not visualized. There is a comminuted and moderately displaced fracture of the distal tibial metadiaphysis. Proximally, there is an oblique fracture extending 10 cm proximal to the tibial plafond which is associated with 1.4 cm of residual lateral displacement and approximately 1.7 cm of proximal displacement. There is approximately 20 degrees of apex posterior angulation at this fracture. There is a large butterfly fragment posteriorly, measuring up to 2.5 cm on sagittal image 48/8. Fractures extend into the posterolateral aspect of the tibial plafond. There is no displacement of the articular surface. Comminuted fracture of the distal fibular diaphysis demonstrates near anatomic reduction, although there is approximately 18 degrees of residual apex posterior angulation on the sagittal images. This fracture does not extend into the distal tibiofibular joint. There is no widening of that joint. The ankle mortise is intact. The talus is located and appears intact. There is a small amount of gas lateral to the subtalar joint without definite associated fracture. The additional tarsal bones appear intact. Ligaments Suboptimally assessed by CT. Muscles and Tendons The Achilles tendon appears ruptured approximately 9.7 cm proximal to its  calcaneal insertion. There is an approximately 3.6 cm gap in the tendon on sagittal image 36/10. Within the tendon gap, there is a complex fluid collection with multiple air bubbles, measuring up to 3.8  x 1.7 cm on axial image 26/4. There are multiple fracture fragments deep to the disrupted tendon. There are surgical staples along the retracted portions of the tendon. The medial flexor tendons also appear displaced into the fracture and there integrity is difficult to confirm. They are intact distally and normally located. The peroneal tendons appear intact, although are slightly subluxed laterally from the retromalleolar groove. The anterior extensor tendons are intact. Soft tissues Extensive soft tissue injury consistent with an open fracture. There are multiple air bubbles throughout the soft tissues, with a complex air-fluid collection posterior to the distal tibia, between the retracted portions of the ruptured Achilles tendon. No other focal fluid collections are seen. There is subcutaneous edema throughout the lower leg. IMPRESSION: 1. Comminuted fracture of the distal tibia demonstrates mild residual displacement and angulation. Fracture extends into the posterolateral aspect of the tibial plafond, although the intra-articular portion is not displaced. 2. Near anatomic reduction of comminuted fracture of the distal fibular diaphysis. 3. No dislocation or tarsal bone injury identified. 4. Complete rupture of the Achilles tendon located at the level of the proximal fracture with associated moderate tendon retraction and a complex air-fluid collection between the retracted portions of the tendon. The medial flexor tendons are also displaced into the tibial fracture and their integrity is difficult to confirm. Electronically Signed   By: Carey Bullocks M.D.   On: 08/16/2017 08:04   Dg Chest Portable 1 View  Result Date: 08/14/2017 CLINICAL DATA:  Struck by lawnmower. EXAM: PORTABLE CHEST 1 VIEW COMPARISON:   Chest radiograph January 22, 2017 FINDINGS: Cardiac silhouette is mildly enlarged, unchanged. Calcified aortic arch, potential ectasia. Status post cardiac valve replacement. Mild bibasilar strandy densities without pleural effusion or focal consolidation. No pneumothorax. Soft tissue planes and included osseous structures are non sub suspicious.Old LEFT rib fracture. IMPRESSION: Bibasilar atelectasis/scarring. Stable cardiomegaly. Aortic Atherosclerosis (ICD10-I70.0). Electronically Signed   By: Awilda Metro M.D.   On: 08/14/2017 19:15   Dg Tibia/fibula Right Port  Result Date: 08/19/2017 CLINICAL DATA:  84 year old male post below-the-knee amputation. Subsequent encounter. EXAM: PORTABLE RIGHT TIBIA AND FIBULA - 2 VIEW COMPARISON:  08/15/2017 CT 08/14/2017 plain film exam. FINDINGS: Post below the right knee leg amputation with drain in place. IMPRESSION: Post below right knee amputation. Electronically Signed   By: Lacy Duverney M.D.   On: 08/19/2017 19:15   Dg Tibia/fibula Right Port  Result Date: 08/14/2017 CLINICAL DATA:  84 year old male status post trauma from spinning lung lower blades. EXAM: PORTABLE RIGHT TIBIA AND FIBULA - 2 VIEW COMPARISON:  None. FINDINGS: AP and cross-table lateral views. Severe oblique penetrating trauma through the right tibia and fibula meta diaphyses. Severely comminuted fractures with numerous small bone fragments scattered in the adjacent soft tissues. Severe associated soft tissue injury. Oblique superior to inferior and anterior to posterior trajectory. Mild lateral displacement and medial angulation of the distal fragments. Fracture of the tibia extends toward distal tibia fibular syndesmosis, but appears to stop short of the mortise joint. Right mortise joint remains normally aligned. Talar dome, and malleoli intact. Calcaneus intact. Superimposed calcified peripheral vascular disease. The mid and proximal right tibia and fibula are intact. Maintained normal  alignment at the right knee. Visible bones of the right foot appear intact. IMPRESSION: 1. Severe penetrating trauma through the right tibia and fibula at an oblique angle with severely comminuted fractures of both bones and numerous small bone fragments. 2. Distal tibia fracture likely extends into the distal tibia-fibular syndesmosis, but appears to  spare the mortise joint. Electronically Signed   By: Odessa Fleming M.D.   On: 08/14/2017 19:21   Dg C-arm 1-60 Min-no Report  Result Date: 08/15/2017 Fluoroscopy was utilized by the requesting physician.  No radiographic interpretation.     Huey Bienenstock M.D on 08/20/2017 at 2:33 PM  Between 7am to 7pm - Pager - [phone]  After 7pm go to [email or web address] - password [facility]  [facility] -  Office  [phone]

## 2017-08-20 NOTE — Progress Notes (Signed)
Orthopedic Trauma Service Progress Note   Patient ID: Charles Patrick MRN: [id number] DOB/AGE: [birth year] 84 y.o.  Subjective:  Doing well Reports no pain in R leg, denies phantom pain as well   No new issues    Review of Systems  Constitutional: Negative for chills and fever.  Respiratory: Negative for shortness of breath and wheezing.   Cardiovascular: Negative for chest pain and palpitations.  Gastrointestinal: Negative for abdominal pain, nausea and vomiting.    Objective:   VITALS:   Vitals:   08/19/17 2326 08/20/17 0513 08/20/17 0907 08/20/17 1359  BP: 105/74 (!) 89/73 116/73 101/67  Pulse: 86 80  93  Resp:  15  14  Temp: 99.3 F (37.4 C) 98.8 F (37.1 C)  (!) 100.8 F (38.2 C)  TempSrc: Oral Oral  Oral  SpO2: 96% 97%  94%  Weight:      Height:        Estimated body mass index is 21.83 kg/m as calculated from the following:   Height as of this encounter: 6\' 2"  (1.88 m).   Weight as of this encounter: 77.1 kg (170 lb).   Intake/Output      06/24 0701 - 06/25 0700 06/25 0701 - 06/26 0700   P.O.  240   I.V. (mL/kg) 900 (11.7)    IV Piggyback     Total Intake(mL/kg) 900 (11.7) 240 (3.1)   Urine (mL/kg/hr) 2450 (1.3)    Stool     Blood 50    Total Output 2500    Net -1600 +240          LABS  Results for orders placed or performed during the hospital encounter of 08/14/17 (from the past 24 hour(s))  CBC     Status: Abnormal   Collection Time: 08/20/17  6:55 AM  Result Value Ref Range   WBC 9.8 4.0 - 10.5 K/uL   RBC 3.63 (L) 4.22 - 5.81 MIL/uL   Hemoglobin 8.6 (L) 13.0 - 17.0 g/dL   HCT 78.2 (L) 95.6 - 21.3 %   MCV 75.8 (L) 78.0 - 100.0 fL   MCH 23.7 (L) 26.0 - 34.0 pg   MCHC 31.3 30.0 - 36.0 g/dL   RDW 08.6 (H) 57.8 - 46.9 %   Platelets 152 150 - 400 K/uL  Basic metabolic panel     Status: Abnormal   Collection Time: 08/20/17  6:55 AM  Result Value Ref Range   Sodium 136 135 - 145 mmol/L   Potassium 4.2 3.5 - 5.1  mmol/L   Chloride 101 98 - 111 mmol/L   CO2 29 22 - 32 mmol/L   Glucose, Bld 116 (H) 70 - 99 mg/dL   BUN 9 8 - 23 mg/dL   Creatinine, Ser 6.29 0.61 - 1.24 mg/dL   Calcium 8.1 (L) 8.9 - 10.3 mg/dL   GFR calc non Af Amer >60 >60 mL/min   GFR calc Af Amer >60 >60 mL/min   Anion gap 6 5 - 15     PHYSICAL EXAM:   Gen: sitting up in bed, appears well, in good spirits, pleasant  Lungs: clear anterior fields Cardiac: regular  Abd: + BS, NTND  Ext:       Right Lower Extremity   Vac functioning well   Scant drainage in canister  Dressing and knee immobilizer intact  Ext warm   Assessment/Plan: 1 Day Post-Op   Principal Problem:   Open right ankle fracture Active Problems:   HTN (hypertension)   HLD (  hyperlipidemia)   Hypothyroidism   S/P TAVR (transcatheter aortic valve replacement)   Anti-infectives (From admission, onward)   Start     Dose/Rate Route Frequency Ordered Stop   08/19/17 2200  ceFAZolin (ANCEF) IVPB 1 g/50 mL premix     1 g 100 mL/hr over 30 Minutes Intravenous Every 8 hours 08/19/17 1713 08/20/17 0620   08/16/17 1630  ceFAZolin (ANCEF) IVPB 1 g/50 mL premix  Status:  Discontinued     1 g 100 mL/hr over 30 Minutes Intravenous Every 8 hours 08/16/17 1110 08/19/17 1714   08/16/17 1600  gentamicin (GARAMYCIN) 520 mg in dextrose 5 % 100 mL IVPB  Status:  Discontinued     520 mg 113 mL/hr over 60 Minutes Intravenous Every 36 hours 08/15/17 1623 08/19/17 1025   08/15/17 2200  ceFAZolin (ANCEF) IVPB 1 g/50 mL premix  Status:  Discontinued     1 g 100 mL/hr over 30 Minutes Intravenous Every 8 hours 08/15/17 1418 08/16/17 1110   08/15/17 0400  ceFAZolin (ANCEF) IVPB 1 g/50 mL premix  Status:  Discontinued     1 g 100 mL/hr over 30 Minutes Intravenous Every 6 hours 08/15/17 0226 08/15/17 1418   08/15/17 0245  gentamicin (GARAMYCIN) 520 mg in dextrose 5 % 100 mL IVPB     520 mg 113 mL/hr over 60 Minutes Intravenous  Once 08/15/17 0231 08/15/17 0356   08/14/17 1845   ceFAZolin (ANCEF) IVPB 1 g/50 mL premix     1 g 100 mL/hr over 30 Minutes Intravenous  Once 08/14/17 1840 08/14/17 2034    .  POD/HD#: 21  [id number] year old active male with open right distal tibia and fibula fracture with tendon and nerve disruption of the posterior compartments s/p irrigation debridement and external fixation   -Complex open right distal tibia fracture with severe soft tissue and nerve injury s/p external fixation and irrigation debridement, Mangled R leg s/p R BKA                continue with knee immobilizer for now  NWB R stump   Dressing change tomorrow or Thursday   Continue with vac   Contact [employer] to get stump shrinker  Ice and elevate for swelling and pain control      - Pain management:             Continue with current regimen  Added gabapentin post op    - ABL anemia/Hemodynamics             stable  Cbc in am    May need PRBCs tomorrow   - Medical issues              Per medical service                         Plavix and aspirin are on hold, would like to continue to hold plavix for another 3 days                            - DVT/PE prophylaxis:             SCDs              lovenox restarted    - ID:              ancef to be completed today    - FEN/GI prophylaxis/Foley/Lines:  reg diet  Dc condom catheter      - Dispo:             therapy evals  CIR eval       Mearl Latin, PA-C [facility] [phone] Traci Sermon (C) 08/20/2017, 2:33 PM

## 2017-08-20 NOTE — Progress Notes (Signed)
Physical medicine rehab consult requested chart reviewed.  Patient status post right BKA 08/19/2017.  Await physical and occupational therapy evaluations and follow-up with appropriate recommendations.

## 2017-08-20 NOTE — Progress Notes (Signed)
Orthopedic Tech Progress Note Patient Details:  Efraim KaufmannDavid B Posa 26-Jan-1925 [id number]  Patient ID: Efraim Kaufmannavid B Postel, male   DOB: 26-Jan-1925, 84 y.o.   MRN: [id number] Called [employer] to order stump shrinker.  Trinna PostMartinez, Oneil Behney J 08/20/2017, 4:33 PM

## 2017-08-20 NOTE — Progress Notes (Signed)
Inpatient Rehabilitation Admissions Coordinator  I have placed order for both PT and OT eval to assist in planning dispo options. I will follow up after eval and rehab consult complete.  Ottie GlazierBarbara Shailene Demonbreun, RN, MSN Rehab Admissions Coordinator [phone] 08/20/2017 4:46 PM

## 2017-08-21 ENCOUNTER — Inpatient Hospital Stay (HOSPITAL_COMMUNITY): Payer: Medicare HMO

## 2017-08-21 DIAGNOSIS — I10 Essential (primary) hypertension: Secondary | ICD-10-CM

## 2017-08-21 DIAGNOSIS — R6510 Systemic inflammatory response syndrome (SIRS) of non-infectious origin without acute organ dysfunction: Secondary | ICD-10-CM

## 2017-08-21 DIAGNOSIS — Z89511 Acquired absence of right leg below knee: Secondary | ICD-10-CM

## 2017-08-21 DIAGNOSIS — Z952 Presence of prosthetic heart valve: Secondary | ICD-10-CM

## 2017-08-21 DIAGNOSIS — G8918 Other acute postprocedural pain: Secondary | ICD-10-CM

## 2017-08-21 DIAGNOSIS — J181 Lobar pneumonia, unspecified organism: Secondary | ICD-10-CM

## 2017-08-21 DIAGNOSIS — D638 Anemia in other chronic diseases classified elsewhere: Secondary | ICD-10-CM | POA: Diagnosis present

## 2017-08-21 DIAGNOSIS — IMO0002 Reserved for concepts with insufficient information to code with codable children: Secondary | ICD-10-CM

## 2017-08-21 DIAGNOSIS — T148XXA Other injury of unspecified body region, initial encounter: Secondary | ICD-10-CM

## 2017-08-21 DIAGNOSIS — R509 Fever, unspecified: Secondary | ICD-10-CM

## 2017-08-21 DIAGNOSIS — D62 Acute posthemorrhagic anemia: Secondary | ICD-10-CM

## 2017-08-21 LAB — URINALYSIS, ROUTINE W REFLEX MICROSCOPIC
BILIRUBIN URINE: NEGATIVE
Bacteria, UA: NONE SEEN
GLUCOSE, UA: NEGATIVE mg/dL
Ketones, ur: NEGATIVE mg/dL
Leukocytes, UA: NEGATIVE
Nitrite: NEGATIVE
PH: 6 (ref 5.0–8.0)
PROTEIN: NEGATIVE mg/dL
Specific Gravity, Urine: 1.013 (ref 1.005–1.030)

## 2017-08-21 LAB — CBC
HCT: 27.4 % — ABNORMAL LOW (ref 39.0–52.0)
Hemoglobin: 8.6 g/dL — ABNORMAL LOW (ref 13.0–17.0)
MCH: 23.6 pg — AB (ref 26.0–34.0)
MCHC: 31.4 g/dL (ref 30.0–36.0)
MCV: 75.1 fL — ABNORMAL LOW (ref 78.0–100.0)
PLATELETS: 171 10*3/uL (ref 150–400)
RBC: 3.65 MIL/uL — AB (ref 4.22–5.81)
RDW: 20.6 % — ABNORMAL HIGH (ref 11.5–15.5)
WBC: 15.1 10*3/uL — AB (ref 4.0–10.5)

## 2017-08-21 MED ORDER — VANCOMYCIN HCL IN DEXTROSE 750-5 MG/150ML-% IV SOLN
750.0000 mg | Freq: Two times a day (BID) | INTRAVENOUS | Status: DC
  Administered 2017-08-22 – 2017-08-23 (×4): 750 mg via INTRAVENOUS
  Filled 2017-08-21 (×4): qty 150

## 2017-08-21 MED ORDER — PIPERACILLIN-TAZOBACTAM 3.375 G IVPB
3.3750 g | Freq: Three times a day (TID) | INTRAVENOUS | Status: DC
  Administered 2017-08-21 – 2017-08-23 (×7): 3.375 g via INTRAVENOUS
  Filled 2017-08-21 (×7): qty 50

## 2017-08-21 MED ORDER — VANCOMYCIN HCL 10 G IV SOLR
1500.0000 mg | Freq: Once | INTRAVENOUS | Status: AC
  Administered 2017-08-21: 1500 mg via INTRAVENOUS
  Filled 2017-08-21: qty 1500

## 2017-08-21 NOTE — Progress Notes (Signed)
Inpatient Rehabilitation Admissions Coordinator  I will begin [facility] for a possible admission to [facility] pending their approval. I will follow up with patient and family tomorrow.  Ottie GlazierBarbara Marciana Uplinger, RN, MSN Rehab Admissions Coordinator [phone] 08/21/2017 4:27 PM

## 2017-08-21 NOTE — Progress Notes (Signed)
Orthopedic Tech Progress Note Patient Details:  Efraim KaufmannDavid B Milstein 1924-04-21 [id number]  Patient ID: Efraim Kaufmannavid B Haugan, male   DOB: 1924-04-21, 84 y.o.   MRN: [id number]   Nikki DomCrawford, Natelie Ostrosky 08/21/2017, 10:03 AM [facility] brace [facility]; [facility]

## 2017-08-21 NOTE — Consult Note (Signed)
Physical Medicine and Rehabilitation Consult Reason for Consult: Decreased functional mobility Referring Physician: Dr. [name]  HPI: Charles Patrick is a 84 y.o. right-handed male with history of chronic anemia, aortic valve stenosis, hypertension, transcatheter aortic valve replacement 01/22/2017 and maintained on aspirin and Plavix.  Per chart review, patient, and son, patient lives with his wife in [facility] [place].  Independent driving prior to admission.  One level home with 3 steps to entry.  Presented 08/14/2017 after mowing a very steep hill when he slid down hit a brick wall which then pinned his right leg between the wall and the lawnmower deck with noted open deformity.  X-rays and imaging revealed complex open right distal tibia and fibula fracture.  Initially placed in external fixator after irrigation and debridement of open tibia-fibula fractures including removal of bone and tendon.  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 [name] and wound VAC applied.  Hospital course pain management.  Acute on chronic anemia 8.6 and monitored.  Subcutaneous Lovenox for DVT prophylaxis.  Physical and Occupational Therapy evaluations pending.  MD has requested physical medicine rehab consult.   Review of Systems  Constitutional: Negative for chills and fever.  HENT: Negative for hearing loss.   Eyes: Negative for blurred vision and double vision.  Respiratory: Negative for cough.   Cardiovascular: Positive for leg swelling. Negative for chest pain and palpitations.  Gastrointestinal: Positive for blood in stool. Negative for nausea and vomiting.  Genitourinary: Positive for urgency. Negative for dysuria, flank pain and hematuria.  Musculoskeletal: Positive for joint pain and myalgias.  Skin: Negative for rash.  All other systems reviewed and are negative.  Past Medical History:  Diagnosis Date  . Anemia   .  Aortic valve stenosis   . Arthropathy   . BPH associated with nocturia   . Carotid artery disease (HCC)   . Colon polyp   . Dilated aortic root (HCC)   . Elevated PSA   . Erectile dysfunction   . Gastric paresis   . Hypercholesterolemia   . Hyperglycemia   . Hypertension    Patient denies  . Hypothyroid   . Iron deficiency   . Pulmonary hypertension (HCC)   . 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 Seizure disorder (HCC)   . Thrombocytopenia (HCC)   . Thyroid nodule   . Tricuspid regurgitation   . Vitamin D deficiency    Past Surgical History:  Procedure Laterality Date  . AMPUTATION Right 08/19/2017   Procedure: AMPUTATION BELOW KNEE;  Surgeon: Myrene Galas, MD;  Location: [facility] OR;  Service: Orthopedics;  Laterality: Right;  . EXTERNAL FIXATION LEG Right 08/14/2017   Procedure: EXTERNAL FIXATION LEG;  Surgeon: Toni Arthurs, MD;  Location: [facility] OR;  Service: Orthopedics;  Laterality: Right;  . I&D EXTREMITY Right 08/16/2017   Procedure: IRRIGATION AND DEBRIDEMENT EXTREMITY;  Surgeon: Myrene Galas, MD;  Location: [facility] OR;  Service: Orthopedics;  Laterality: Right;  . INCISION AND DRAINAGE Right 08/14/2017   Procedure: IRRIGATION AND DEBRIDMENT RIGHT ANKLE, APPLICATION WOUND VAC;  Surgeon: Toni Arthurs, MD;  Location: [facility] OR;  Service: Orthopedics;  Laterality: Right;  . MULTIPLE EXTRACTIONS WITH ALVEOLOPLASTY N/A 10/17/2016   Procedure: Extraction of tooth #'s 4, 21,22,27,28,29 with alveoloplasty;  Surgeon: Charlynne Pander, DDS;  Location: [facility] OR;  Service: Oral Surgery;  Laterality: N/A;  .  NO PAST SURGERIES    . RIGHT/LEFT HEART CATH AND CORONARY ANGIOGRAPHY N/A 01/11/2017   Procedure: RIGHT/LEFT HEART CATH AND CORONARY ANGIOGRAPHY;  Surgeon: Tonny Bollman, MD;  Location: [facility] INVASIVE CV LAB;  Service: Cardiovascular;  Laterality: N/A;  . TEE WITHOUT  CARDIOVERSION N/A 01/22/2017   Procedure: TRANSESOPHAGEAL ECHOCARDIOGRAM (TEE);  Surgeon: Tonny Bollman, MD;  Location: [facility] OR;  Service: Open Heart Surgery;  Laterality: N/A;  . TRANSCATHETER AORTIC VALVE REPLACEMENT, TRANSFEMORAL N/A 01/22/2017   Procedure: TRANSCATHETER AORTIC VALVE REPLACEMENT, TRANSFEMORAL using Edwards 29 Sapien 3 Aortic Valve;  Surgeon: Tonny Bollman, MD;  Location: [facility] OR;  Service: Open Heart Surgery;  Laterality: N/A;   Family History  Problem Relation Age of Onset  . Healthy Mother   . Healthy Father    Social History:  reports that he has never smoked. He has never used smokeless tobacco. He reports that he does not drink alcohol or use drugs. Allergies: No Known Allergies Medications Prior to Admission  Medication Sig Dispense Refill  . aspirin EC 81 MG tablet Take 81 mg by mouth daily.     . clopidogrel (PLAVIX) 75 MG tablet Take 1 tablet (75 mg total) by mouth daily with breakfast. 30 tablet 5  . levothyroxine (SYNTHROID, LEVOTHROID) 50 MCG tablet Take 50 mcg by mouth daily before breakfast.     . Multiple Vitamin (MULTIVITAMIN) capsule Take 1 capsule by mouth daily.    . simvastatin (ZOCOR) 40 MG tablet Take 40 mg by mouth daily at 6 PM.     . tamsulosin (FLOMAX) 0.4 MG CAPS capsule Take 0.4 mg by mouth daily after supper.     Marland Kitchen amoxicillin (AMOXIL) 500 MG tablet Take 4 tablets by mouth one hour prior to dental appointments and stomach or bladder procedures 4 tablet 3    Home: Home Living Living Arrangements: Spouse/significant other  Functional History:   Functional Status:  Mobility:          ADL:    Cognition: Cognition Orientation Level: Oriented X4    Blood pressure 98/67, pulse 79, temperature 97.9 F (36.6 C), temperature source Oral, resp. rate 15, height 6\' 2"  (1.88 m), weight 77.1 kg (170 lb), SpO2 96 %. Physical Exam  Vitals reviewed. Constitutional: He is oriented to person, place, and time. He appears well-developed and  well-nourished.  HENT:  Head: Normocephalic and atraumatic.  Eyes: Right eye exhibits no discharge. Left eye exhibits no discharge. No scleral icterus.  Neck: Normal range of motion. Neck supple. No thyromegaly present.  Cardiovascular: Normal rate and regular rhythm.  Respiratory: Effort normal and breath sounds normal. No respiratory distress.  GI: Soft. Bowel sounds are normal. He exhibits no distension.  Musculoskeletal:  RLE stump edema and tenderness  Neurological: He is alert and oriented to person, place, and time.  Motor: B/l UE 5/5 proximal to distal LLE: 4/5 proximal to distal RLE: HF 4/5 (pain inhibition) Sensation intact to light touch  Skin:  BKA site is dressed with wound VAC in place  Psychiatric: He has a normal mood and affect. His behavior is normal.    Results for orders placed or performed during the hospital encounter of 08/14/17 (from the past 24 hour(s))  CBC     Status: Abnormal   Collection Time: 08/20/17  6:55 AM  Result Value Ref Range   WBC 9.8 4.0 - 10.5 K/uL   RBC 3.63 (L) 4.22 - 5.81 MIL/uL   Hemoglobin 8.6 (L) 13.0 - 17.0 g/dL   HCT 16.1 (L)  39.0 - 52.0 %   MCV 75.8 (L) 78.0 - 100.0 fL   MCH 23.7 (L) 26.0 - 34.0 pg   MCHC 31.3 30.0 - 36.0 g/dL   RDW 62.120.9 (H) 30.811.5 - 65.715.5 %   Platelets 152 150 - 400 K/uL  Basic metabolic panel     Status: Abnormal   Collection Time: 08/20/17  6:55 AM  Result Value Ref Range   Sodium 136 135 - 145 mmol/L   Potassium 4.2 3.5 - 5.1 mmol/L   Chloride 101 98 - 111 mmol/L   CO2 29 22 - 32 mmol/L   Glucose, Bld 116 (H) 70 - 99 mg/dL   BUN 9 8 - 23 mg/dL   Creatinine, Ser 8.461.04 0.61 - 1.24 mg/dL   Calcium 8.1 (L) 8.9 - 10.3 mg/dL   GFR calc non Af Amer >60 >60 mL/min   GFR calc Af Amer >60 >60 mL/min   Anion gap 6 5 - 15  CBC     Status: Abnormal   Collection Time: 08/21/17  4:16 AM  Result Value Ref Range   WBC 15.1 (H) 4.0 - 10.5 K/uL   RBC 3.65 (L) 4.22 - 5.81 MIL/uL   Hemoglobin 8.6 (L) 13.0 - 17.0 g/dL    HCT 96.227.4 (L) 95.239.0 - 52.0 %   MCV 75.1 (L) 78.0 - 100.0 fL   MCH 23.6 (L) 26.0 - 34.0 pg   MCHC 31.4 30.0 - 36.0 g/dL   RDW 84.120.6 (H) 32.411.5 - 40.115.5 %   Platelets 171 150 - 400 K/uL   Dg Tibia/fibula Right Port  Result Date: 08/19/2017 CLINICAL DATA:  84 year old male post below-the-knee amputation. Subsequent encounter. EXAM: PORTABLE RIGHT TIBIA AND FIBULA - 2 VIEW COMPARISON:  08/15/2017 CT 08/14/2017 plain film exam. FINDINGS: Post below the right knee leg amputation with drain in place. IMPRESSION: Post below right knee amputation. Electronically Signed   By: Lacy DuverneySteven  Olson M.D.   On: 08/19/2017 19:15     Assessment/Plan: Diagnosis: Right BKA Labs independently reviewed.  Records reviewed and summated above. Clean amputation daily with soap and water Monitor incision site for signs of infection or impending skin breakdown. Staples to remain in place for 3-4 weeks Stump shrinker, for edema control  Scar mobilization massaging to prevent soft tissue adherence Stump protector during therapies Prevent flexion contractures by implementing the following:   Encourage prone lying for 20-30 mins per day BID to avoid hip flexion  Contractures if medically appropriate;  Avoid pillow under knees when patient is lying in bed in order to prevent both  knee and hip flexion contractures;  Avoid prolonged sitting Post surgical pain control with oral medication Phantom limb pain control with physical modalities including desensitization techniques (gentle self massage to the residual stump,hot packs if sensation intact, US) and mirror therapy, TENS. If ineffective, consider pharmacological treatment for neuropathic pain (e.g gabapentin, pregabalin, amytriptalyine, duloxetine).  When using wheelchair, patient should have knee on amputated side fully extended with board under the seat cushion. Avoid injury to contralateral side  1. Does the need for close, 24 hr/day medical supervision in concert with the  patient's rehab needs make it unreasonable for this patient to be served in a less intensive setting? Potentially  2. Co-Morbidities requiring supervision/potential complications: aortic valve stenosis s/p transcatheter aortic valve replacement (Monitor in accordance with increased physical activity and avoid UE resistance excercises), HTN (monitor and provide prns in accordance with increased physical exertion and pain), post-op pain management (Biofeedback training with therapies to  help reduce reliance on opiate pain medications, monitor pain control during therapies, and sedation at rest and titrate to maximum efficacy to ensure participation and gains in therapies), Acute on chronic anemia (transfuse if necessary to ensure appropriate perfusion for increased activity tolerance), SIRS (FUO and leukocytosis, cont to monitor for signs and symptoms of infection, further workup if indicated) 3. Due to safety, skin/wound care, disease management, pain management and patient education, does the patient require 24 hr/day rehab nursing? Potentially 4. Does the patient require coordinated care of a physician, rehab nurse, PT (1-2 hrs/day, 5 days/week) and OT (1-2 hrs/day, 5 days/week) to address physical and functional deficits in the context of the above medical diagnosis(es)? Potentially Addressing deficits in the following areas: balance, endurance, locomotion, strength, transferring, bathing, dressing, toileting and psychosocial support 5. Can the patient actively participate in an intensive therapy program of at least 3 hrs of therapy per day at least 5 days per week? Potentially 6. The potential for patient to make measurable gains while on inpatient rehab is TBD 7. Anticipated functional outcomes upon discharge from inpatient rehab are TBD  with PT, TBD with OT, n/a with SLP. 8. Estimated rehab length of stay to reach the above functional goals is: TBD. 9. Anticipated D/C setting: Home 10. Anticipated  post D/C treatments: Outpatient therapy and Home excercise program 11. Overall Rehab/Functional Prognosis: good  RECOMMENDATIONS: This patient's condition is appropriate for continued rehabilitative care in the following setting: Will await therapy evals prior to determining most appropriate discharge disposisiton.   Patient has agreed to participate in recommended program. Potentially Note that insurance prior authorization may be required for reimbursement for recommended care.  Comment: Rehab Admissions Coordinator to follow up.   I have personally performed a face to face diagnostic evaluation, including, but not limited to relevant history and physical exam findings, of this patient and developed relevant assessment and plan.  Additionally, I have reviewed and concur with the physician assistant's documentation above.   Maryla Morrow, MD, ABPMR Mcarthur Rossetti Angiulli, PA-C 08/21/2017

## 2017-08-21 NOTE — Plan of Care (Signed)
  Problem: Risk for Falls Goal: Ability to verbalize understanding of medication therapies will improve Outcome: Progressing Goal: Knowledge of Patient Safety will improve Outcome: Progressing   Problem: Pain Managment: Goal: General experience of comfort will improve Outcome: Progressing   Problem: Safety: Goal: Ability to remain free from injury will improve Outcome: Progressing   Problem: Clinical Measurements: Goal: Postoperative complications will be avoided or minimized Outcome: Progressing   Problem: Self-Care: Goal: Ability to meet self-care needs will improve Outcome: Progressing

## 2017-08-21 NOTE — Progress Notes (Signed)
Pharmacy Antibiotic Note  Efraim KaufmannDavid B Trimmer is a 84 y.o. male admitted on 08/14/2017 with injury to ankle while moving grass.  He is s/p I&D, ORIF and BKA.  Now with early PNA and Pharmacy has been consulted for vancomycin dosing.  Patient is also on Zosyn.  SCr 1.04, CrCL 52 ml/min.  Tmax 100.8, WBC up 15.1.   Plan: Vanc 1500mg  IV x 1, then 750mg  IV Q12H Zosyn EID 3.375gm IV Q8H per MD Monitor renal fxn, clinical progress, vanc trough at Css dose d/t advanced age   Height: 6\' 2"  (188 cm) Weight: 170 lb (77.1 kg) IBW/kg (Calculated) : 82.2  Temp (24hrs), Avg:98.8 F (37.1 C), Min:97.9 F (36.6 C), Max:99.5 F (37.5 C)  Recent Labs  Lab 08/15/17 0527 08/15/17 1251 08/16/17 0506 08/17/17 0358 08/19/17 0423 08/19/17 0628 08/20/17 0655 08/21/17 0416  WBC 13.6*  --  12.9* 12.0* 8.7 8.7 9.8 15.1*  CREATININE 1.12  --  1.08 0.97 0.84  --  1.04  --   LATICACIDVEN  --   --  1.3  --   --   --   --   --   GENTRANDOM  --  5.5  --   --   --   --   --   --     Estimated Creatinine Clearance: 51.5 mL/min (by C-G formula based on SCr of 1.04 mg/dL).    No Known Allergies   Gent 6/20 >> 6/24 Ancef 6/20 >> Vanc 6/26 >> Zosyn 6/26 >>  6/20: 10 hr gent level = 5.5 mcg/mL >> q36h dosing  6/26 BCx -    Urania Pearlman D. Laney Potashang, PharmD, BCPS, BCCCP Pager:  [phone] 08/21/2017, 2:19 PM

## 2017-08-21 NOTE — Progress Notes (Addendum)
PROGRESS NOTE    Charles Patrick  ZOX:[id number] DOB: 07/18/1924 DOA: 08/14/2017 PCP: [name], [name], PA-C     Brief Narrative:  Charles Patrick is a 84 year old male with history of severe aortic stenosis status post TAVR last November, hyperlipidemia, BPH, chronic anemia, hypothyroidism who had injured his right lower extremity with riding lawnmower. Patient has significant bleed and x-rays done in the ER revealed fracture of the tibia. He underwent surgical repair by Dr. Cecilie KicksHewit with 08/15/2017, and eventually underwent right BKA on 08/19/2017 due to significant injury.   New events last 24 hours / Subjective: Patient without any new complaints today. Denies pain of RLE. No chest pain, shortness of breath, nausea, vomiting. Fever yesterday 100.8.   Assessment & Plan:   Principal Problem:   Open right ankle fracture Active Problems:   HTN (hypertension)   HLD (hyperlipidemia)   Hypothyroidism   S/P TAVR (transcatheter aortic valve replacement)   Right lower extremity injury with tibial and fibula fracture -Treatment per primary orthopedic team, status post irrigation and excisional debridement, with open reduction of tibia and fibula fracture and application of multiplane external fixator and wound VAC. Patient injury so significant, limb salvage is very hard to achieve, went for right BKA by Dr. Carola FrostHandy on 08/19/2017 -Management and DVT prophylaxis per primary orthopedic team  SIRS -Fever 100.8 on 6/25 afternoon with elevation of leukocytosis 9.8 --> 15.1 today -Check blood cultures, UA, CXR  -Dressing change planned by orthopedic surgery today or tomorrow  -Addendum: CXR with LLL atelectasis or early pneumonia. Start vanco/zosyn for HCAP coverage and trend fever, WBC   Status post TAVR  -Appears to be euvolemic, continue to monitor closely -Hold aspirin/plavix until okay by orthopedic surgery   Hypothyroidism  -Continue Synthroid  Anemia -Microcytic anemia, as well acute  blood loss anemia postoperatively, was transfused 1 unit PRBC for hemoglobin of 7.5, with good response, continue to monitor closely and transfuse for hemoglobin less than 8   BPH  -Continue tamsulosin  Hyperlipidemia  -Continue zocor     DVT prophylaxis: Lovenox Code Status: Full Family Communication: No family at bedside Disposition Plan: Pending orthopedic surgery recommendations, PT OT eval, sepsis work up    Consultants:   Orthopedic surgery  Procedures:  By Dr. Beather ArbourJohn Whitt on 08/15/2017 1. Irrigation and excisional debridement of open right tibia and fibula fractures 2. Complex closure of right leg wounds totaling 25 cm in length 3. Open reduction of tibia and fibula fractures 4. Application of multiplane external fixator 5. Application of wound VAC  Dr. Carola FrostHandy on 08/19/2017 1. Removal of external fixator, right ankle. 2. Right below knee amputation.   Antimicrobials:  Anti-infectives (From admission, onward)   Start     Dose/Rate Route Frequency Ordered Stop   08/19/17 2200  ceFAZolin (ANCEF) IVPB 1 g/50 mL premix     1 g 100 mL/hr over 30 Minutes Intravenous Every 8 hours 08/19/17 1713 08/20/17 1539   08/16/17 1630  ceFAZolin (ANCEF) IVPB 1 g/50 mL premix  Status:  Discontinued     1 g 100 mL/hr over 30 Minutes Intravenous Every 8 hours 08/16/17 1110 08/19/17 1714   08/16/17 1600  gentamicin (GARAMYCIN) 520 mg in dextrose 5 % 100 mL IVPB  Status:  Discontinued     520 mg 113 mL/hr over 60 Minutes Intravenous Every 36 hours 08/15/17 1623 08/19/17 1025   08/15/17 2200  ceFAZolin (ANCEF) IVPB 1 g/50 mL premix  Status:  Discontinued     1 g  100 mL/hr over 30 Minutes Intravenous Every 8 hours 08/15/17 1418 08/16/17 1110   08/15/17 0400  ceFAZolin (ANCEF) IVPB 1 g/50 mL premix  Status:  Discontinued     1 g 100 mL/hr over 30 Minutes Intravenous Every 6 hours 08/15/17 0226 08/15/17 1418   08/15/17 0245  gentamicin (GARAMYCIN) 520 mg in dextrose 5 % 100 mL  IVPB     520 mg 113 mL/hr over 60 Minutes Intravenous  Once 08/15/17 0231 08/15/17 0356   08/14/17 1845  ceFAZolin (ANCEF) IVPB 1 g/50 mL premix     1 g 100 mL/hr over 30 Minutes Intravenous  Once 08/14/17 1840 08/14/17 2034       Objective: Vitals:   08/20/17 1359 08/20/17 1630 08/20/17 1933 08/21/17 0424  BP: 101/67  102/72 98/67  Pulse: 93  90 79  Resp: 14   15  Temp: (!) 100.8 F (38.2 C) 99.1 F (37.3 C) 99.5 F (37.5 C) 97.9 F (36.6 C)  TempSrc: Oral  Oral Oral  SpO2: 94%  94% 96%  Weight:      Height:        Intake/Output Summary (Last 24 hours) at 08/21/2017 9604 Last data filed at 08/21/2017 0802 Gross per 24 hour  Intake 1903.23 ml  Output 2500 ml  Net -596.77 ml   Filed Weights   08/14/17 1846  Weight: 77.1 kg (170 lb)    Examination:  General exam: Appears calm and comfortable  Eyes: Right eye blind  Respiratory system: Clear to auscultation. Respiratory effort normal. Cardiovascular system: S1 & S2 heard, RRR. No JVD, murmurs, rubs, gallops or clicks. No pedal edema. Gastrointestinal system: Abdomen is nondistended, soft and nontender. No organomegaly or masses felt. Normal bowel sounds heard. Central nervous system: Alert and oriented. No focal neurological deficits. Extremities: S/p right BKA in brace  Skin: No rashes, lesions or ulcers on exposed skin  Psychiatry: Judgement and insight appear normal. Mood & affect appropriate.   Data Reviewed: I have personally reviewed following labs and imaging studies  CBC: Recent Labs  Lab 08/14/17 1843  08/17/17 0358 08/19/17 0423 08/19/17 0628 08/20/17 0655 08/21/17 0416  WBC 12.1*   < > 12.0* 8.7 8.7 9.8 15.1*  NEUTROABS 6.8  --   --   --   --   --   --   HGB 10.2*   < > 8.5* 10.0* 9.5* 8.6* 8.6*  HCT 34.2*   < > 26.6* 31.1* 30.9* 27.5* 27.4*  MCV 71.5*   < > 75.8* 74.6* 76.9* 75.8* 75.1*  PLT 206   < > 109* PLATELET CLUMPS NOTED ON SMEAR, UNABLE TO ESTIMATE 173 152 171   < > = values in this  interval not displayed.   Basic Metabolic Panel: Recent Labs  Lab 08/14/17 1900 08/14/17 2212 08/15/17 0527 08/16/17 0506 08/17/17 0358 08/19/17 0423 08/20/17 0655  NA 143 142  --  137 139 140 136  K 3.6 3.7  --  3.9 3.9 3.3* 4.2  CL 107  --   --  108 107 105 101  CO2  --   --   --  24 27 28 29   GLUCOSE 151*  --   --  126* 117* 99 116*  BUN 15  --   --  14 9 8 9   CREATININE 1.10  --  1.12 1.08 0.97 0.84 1.04  CALCIUM  --   --   --  7.9* 8.0* 8.3* 8.1*   GFR: Estimated Creatinine Clearance: 51.5 mL/min (  by C-G formula based on SCr of 1.04 mg/dL). Liver Function Tests: Recent Labs  Lab 08/16/17 0506  AST 21  ALT 13*  ALKPHOS 49  BILITOT 0.5  PROT 5.7*  ALBUMIN 3.1*   No results for input(s): LIPASE, AMYLASE in the last 168 hours. No results for input(s): AMMONIA in the last 168 hours. Coagulation Profile: Recent Labs  Lab 08/16/17 0506  INR 1.25   Cardiac Enzymes: No results for input(s): CKTOTAL, CKMB, CKMBINDEX, TROPONINI in the last 168 hours. BNP (last 3 results) No results for input(s): PROBNP in the last 8760 hours. HbA1C: No results for input(s): HGBA1C in the last 72 hours. CBG: No results for input(s): GLUCAP in the last 168 hours. Lipid Profile: No results for input(s): CHOL, HDL, LDLCALC, TRIG, CHOLHDL, LDLDIRECT in the last 72 hours. Thyroid Function Tests: No results for input(s): TSH, T4TOTAL, FREET4, T3FREE, THYROIDAB in the last 72 hours. Anemia Panel: No results for input(s): VITAMINB12, FOLATE, FERRITIN, TIBC, IRON, RETICCTPCT in the last 72 hours. Sepsis Labs: Recent Labs  Lab 08/16/17 0506  LATICACIDVEN 1.3    Recent Results (from the past 240 hour(s))  Surgical PCR screen     Status: None   Collection Time: 08/15/17  7:48 AM  Result Value Ref Range Status   MRSA, PCR NEGATIVE NEGATIVE Final   Staphylococcus aureus NEGATIVE NEGATIVE Final    Comment: (NOTE) The Xpert SA Assay (FDA approved for NASAL specimens in patients  21 years of age and older), is one component of a comprehensive surveillance program. It is not intended to diagnose infection nor to guide or monitor treatment. Performed at [facility], [address] [place], [place] [address]        Radiology Studies: Dg Tibia/fibula Right Port  Result Date: 08/19/2017 CLINICAL DATA:  84 year old male post below-the-knee amputation. Subsequent encounter. EXAM: PORTABLE RIGHT TIBIA AND FIBULA - 2 VIEW COMPARISON:  08/15/2017 CT 08/14/2017 plain film exam. FINDINGS: Post below the right knee leg amputation with drain in place. IMPRESSION: Post below right knee amputation. Electronically Signed   By: Lacy Duverney M.D.   On: 08/19/2017 19:15      Scheduled Meds: . acetaminophen  1,000 mg Oral Q8H  . bisacodyl  10 mg Rectal UD  . docusate sodium  100 mg Oral BID  . enoxaparin (LOVENOX) injection  40 mg Subcutaneous Q24H  . gabapentin  300 mg Oral BID  . levothyroxine  50 mcg Oral QAC breakfast  . senna-docusate  2 tablet Oral BID  . simvastatin  40 mg Oral q1800  . tamsulosin  0.4 mg Oral QPC supper   Continuous Infusions: . sodium chloride 10 mL/hr at 08/20/17 1745  . lactated ringers 10 mL/hr at 08/19/17 1227  . methocarbamol (ROBAXIN)  IV       LOS: 6 days    Time spent: 35 minutes   Noralee Stain, DO [facility] [email or web address] Password [facility] 08/21/2017, 9:37 AM

## 2017-08-21 NOTE — Evaluation (Addendum)
Occupational Therapy Evaluation Patient Details Name: Charles Patrick MRN: [id number] DOB: 10-15-1924 Today's Date: 08/21/2017    History of Present Illness R AKA   Clinical Impression   Pt with decline in function and safety with ADLs and ADL mobility with decreased strength, balance and endurance. PTA, pt independent, did not use an AD for mobility, was driving and doing yard work. Pt lives at home with his wife. Pt very motivated to work with therapy, pleasant and cooperative; family very supportive. Pt would benefit from acute OT services to address impairments to maximize level of function to return home    Follow Up Recommendations  CIR    Equipment Recommendations  Other (comment)(TBD at next venue of care)    Recommendations for Other Services Rehab consult     Precautions / Restrictions Precautions Precautions: Fall Precaution Comments: wound vac R LE, Required Braces or Orthoses: Knee Immobilizer - Right Restrictions Weight Bearing Restrictions: Yes RLE Weight Bearing: Non weight bearing      Mobility Bed Mobility Overal bed mobility: Needs Assistance Bed Mobility: Supine to Sit     Supine to sit: Min guard        Transfers Overall transfer level: Needs assistance Equipment used: Standard walker Transfers: Sit to/from BJ'sStand;Stand Pivot Transfers Sit to Stand: +2 physical assistance;+2 safety/equipment Stand pivot transfers: Min assist;+2 physical assistance;+2 safety/equipment       General transfer comment: cues for correct hand placement    Balance Overall balance assessment: Needs assistance Sitting-balance support: No upper extremity supported;Feet supported Sitting balance-Leahy Scale: Fair     Standing balance support: Single extremity supported;Bilateral upper extremity supported;During functional activity Standing balance-Leahy Scale: Poor                             ADL either performed or assessed with clinical judgement    ADL Overall ADL's : Needs assistance/impaired Eating/Feeding: Independent;Sitting   Grooming: Wash/dry hands;Wash/dry face;Sitting;Min guard   Upper Body Bathing: Min guard;Sitting   Lower Body Bathing: Maximal assistance;Sitting/lateral leans   Upper Body Dressing : Min guard;Sitting   Lower Body Dressing: Total assistance;Maximal assistance;Sitting/lateral leans;Sit to/from stand   Toilet Transfer: Minimal assistance;+2 for physical assistance;+2 for safety/equipment;Stand-pivot;RW Toilet Transfer Details (indicate cue type and reason): simulated bed to recliner Toileting- Clothing Manipulation and Hygiene: Maximal assistance;Sitting/lateral lean;Sit to/from stand       Functional mobility during ADLs: Minimal assistance;+2 for physical assistance;+2 for safety/equipment;Rolling walker       Vision Baseline Vision/History: Legally blind(R eye) Patient Visual Report: No change from baseline       Perception     Praxis      Pertinent Vitals/Pain Pain Assessment: 0-10 Pain Score: 3  Pain Descriptors / Indicators: Sore;Tingling Pain Intervention(s): Monitored during session;Repositioned     Hand Dominance Right   Extremity/Trunk Assessment Upper Extremity Assessment Upper Extremity Assessment: Generalized weakness   Lower Extremity Assessment Lower Extremity Assessment: Defer to PT evaluation       Communication Communication Communication: No difficulties   Cognition Arousal/Alertness: Awake/alert Behavior During Therapy: WFL for tasks assessed/performed Overall Cognitive Status: Within Functional Limits for tasks assessed                                     General Comments       Exercises     Shoulder Instructions      Home  Living Family/patient expects to be discharged to:: Private residence Living Arrangements: Spouse/significant other Available Help at Discharge: Family;Available 24 hours/day Type of Home: House Home  Access: Stairs to enter [employer] of Steps: 1   Home Layout: One level     Bathroom Shower/Tub: Chief Strategy Officer: Handicapped height     Home Equipment: None          Prior Functioning/Environment Level of Independence: Independent        Comments: used no AD for mobility. Was mowing grass when injured        OT Problem List: Decreased strength;Decreased activity tolerance;Decreased knowledge of use of DME or AE;Impaired balance (sitting and/or standing);Decreased coordination;Decreased knowledge of precautions;Pain      OT Treatment/Interventions: Self-care/ADL training;Therapeutic exercise;DME and/or AE instruction;Neuromuscular education;Therapeutic activities;Patient/family education    OT Goals(Current goals can be found in the care plan section) Acute Rehab OT Goals Patient Stated Goal: get better and go  OT Goal Formulation: With patient/family Time For Goal Achievement: 09/04/17 Potential to Achieve Goals: Good ADL Goals Pt Will Perform Grooming: with min guard assist;sitting Pt Will Perform Upper Body Bathing: sitting;with supervision;with set-up Pt Will Perform Lower Body Bathing: with mod assist;sitting/lateral leans Pt Will Perform Upper Body Dressing: with supervision;with set-up;sitting Pt Will Perform Lower Body Dressing: with max assist;with mod assist;sitting/lateral leans Pt Will Transfer to Toilet: with min assist;stand pivot transfer;bedside commode Pt Will Perform Toileting - Clothing Manipulation and hygiene: with mod assist;sit to/from stand  OT Frequency: Min 2X/week   Barriers to D/C:    no barriers       Co-evaluation PT/OT/SLP Co-Evaluation/Treatment: Yes Reason for Co-Treatment: For patient/therapist safety;To address functional/ADL transfers   OT goals addressed during session: ADL's and self-care;Proper use of Adaptive equipment and DME      AM-PAC PT "6 Clicks" Daily Activity     Outcome Measure  Help from another person eating meals?: None Help from another person taking care of personal grooming?: A Little Help from another person toileting, which includes using toliet, bedpan, or urinal?: A Lot Help from another person bathing (including washing, rinsing, drying)?: A Lot Help from another person to put on and taking off regular upper body clothing?: A Little Help from another person to put on and taking off regular lower body clothing?: Total 6 Click Score: 15   End of Session Equipment Utilized During Treatment: Gait belt;Rolling walker  Activity Tolerance: Patient tolerated treatment well Patient left: in chair;with call bell/phone within reach;with chair alarm set;with family/visitor present  OT Visit Diagnosis: Unsteadiness on feet (R26.81);Other abnormalities of gait and mobility (R26.89);Muscle weakness (generalized) (M62.81);Pain Pain - Right/Left: Right Pain - part of body: Leg                Time: 1340-1411 OT Time Calculation (min): 31 min Charges:  OT General Charges $OT Visit: 1 Visit OT Evaluation $OT Eval Moderate Complexity: 1 Mod G-Codes: OT G-codes **NOT FOR INPATIENT CLASS** Functional Assessment Tool Used: AM-PAC 6 Clicks Daily Activity     Galen Manila 08/21/2017, 2:58 PM

## 2017-08-21 NOTE — Progress Notes (Signed)
Orthopedic Trauma Service Progress Note   Patient ID: Charles Patrick MRN: [id number] DOB/AGE: June 18, 1924 84 y.o.  Subjective:  Doing well No complaints   Review of Systems  Constitutional: Negative for chills and fever.  Respiratory: Negative for shortness of breath and wheezing.   Cardiovascular: Negative for chest pain and palpitations.  Gastrointestinal: Negative for abdominal pain, nausea and vomiting.    Objective:   VITALS:   Vitals:   08/20/17 1359 08/20/17 1630 08/20/17 1933 08/21/17 0424  BP: 101/67  102/72 98/67  Pulse: 93  90 79  Resp: 14   15  Temp: (!) 100.8 F (38.2 C) 99.1 F (37.3 C) 99.5 F (37.5 C) 97.9 F (36.6 C)  TempSrc: Oral  Oral Oral  SpO2: 94%  94% 96%  Weight:      Height:        Estimated body mass index is 21.83 kg/m as calculated from the following:   Height as of this encounter: 6\' 2"  (1.88 m).   Weight as of this encounter: 77.1 kg (170 lb).   Intake/Output      06/25 0701 - 06/26 0700 06/26 0701 - 06/27 0700   P.O. 1080 240   I.V. (mL/kg) 823.2 (10.7)    IV Piggyback 0    Total Intake(mL/kg) 1903.2 (24.7) 240 (3.1)   Urine (mL/kg/hr) 1800 (1) 700 (2.6)   Blood     Total Output 1800 700   Net +103.2 -460          LABS  Results for orders placed or performed during the hospital encounter of 08/14/17 (from the past 24 hour(s))  CBC     Status: Abnormal   Collection Time: 08/21/17  4:16 AM  Result Value Ref Range   WBC 15.1 (H) 4.0 - 10.5 K/uL   RBC 3.65 (L) 4.22 - 5.81 MIL/uL   Hemoglobin 8.6 (L) 13.0 - 17.0 g/dL   HCT 57.3 (L) 22.0 - 25.4 %   MCV 75.1 (L) 78.0 - 100.0 fL   MCH 23.6 (L) 26.0 - 34.0 pg   MCHC 31.4 30.0 - 36.0 g/dL   RDW 27.0 (H) 62.3 - 76.2 %   Platelets 171 150 - 400 K/uL  Urinalysis, Routine w reflex microscopic     Status: Abnormal   Collection Time: 08/21/17  7:34 AM  Result Value Ref Range   Color, Urine YELLOW YELLOW   APPearance CLEAR CLEAR   Specific Gravity,  Urine 1.013 1.005 - 1.030   pH 6.0 5.0 - 8.0   Glucose, UA NEGATIVE NEGATIVE mg/dL   Hgb urine dipstick MODERATE (A) NEGATIVE   Bilirubin Urine NEGATIVE NEGATIVE   Ketones, ur NEGATIVE NEGATIVE mg/dL   Protein, ur NEGATIVE NEGATIVE mg/dL   Nitrite NEGATIVE NEGATIVE   Leukocytes, UA NEGATIVE NEGATIVE   RBC / HPF 0-5 0 - 5 RBC/hpf   WBC, UA 0-5 0 - 5 WBC/hpf   Bacteria, UA NONE SEEN NONE SEEN   Squamous Epithelial / LPF 0-5 0 - 5   Mucus PRESENT      PHYSICAL EXAM:   Gen: sitting up in bed, appears well, in good spirits, pleasant  Lungs: clear anterior fields Cardiac: regular  Abd: + BS, NTND  Ext:       Right Lower Extremity              Vac functioning well  Scant drainage in canister             shrinker applied and fitting well  Pt can move knee without too much difficulty              Ext warm    Assessment/Plan: 2 Days Post-Op   Principal Problem:   Open right ankle fracture Active Problems:   HTN (hypertension)   HLD (hyperlipidemia)   Hypothyroidism   S/P TAVR (transcatheter aortic valve replacement)   Anti-infectives (From admission, onward)   Start     Dose/Rate Route Frequency Ordered Stop   08/19/17 2200  ceFAZolin (ANCEF) IVPB 1 g/50 mL premix     1 g 100 mL/hr over 30 Minutes Intravenous Every 8 hours 08/19/17 1713 08/20/17 1539   08/16/17 1630  ceFAZolin (ANCEF) IVPB 1 g/50 mL premix  Status:  Discontinued     1 g 100 mL/hr over 30 Minutes Intravenous Every 8 hours 08/16/17 1110 08/19/17 1714   08/16/17 1600  gentamicin (GARAMYCIN) 520 mg in dextrose 5 % 100 mL IVPB  Status:  Discontinued     520 mg 113 mL/hr over 60 Minutes Intravenous Every 36 hours 08/15/17 1623 08/19/17 1025   08/15/17 2200  ceFAZolin (ANCEF) IVPB 1 g/50 mL premix  Status:  Discontinued     1 g 100 mL/hr over 30 Minutes Intravenous Every 8 hours 08/15/17 1418 08/16/17 1110   08/15/17 0400  ceFAZolin (ANCEF) IVPB 1 g/50 mL premix  Status:  Discontinued      1 g 100 mL/hr over 30 Minutes Intravenous Every 6 hours 08/15/17 0226 08/15/17 1418   08/15/17 0245  gentamicin (GARAMYCIN) 520 mg in dextrose 5 % 100 mL IVPB     520 mg 113 mL/hr over 60 Minutes Intravenous  Once 08/15/17 0231 08/15/17 0356   08/14/17 1845  ceFAZolin (ANCEF) IVPB 1 g/50 mL premix     1 g 100 mL/hr over 30 Minutes Intravenous  Once 08/14/17 1840 08/14/17 2034    .  POD/HD#: 57  85 year old active male with open right distal tibia and fibula fracture with tendon and nerve disruption of the posterior compartments s/p irrigation debridement and external fixation   -Complex open right distal tibia fracture with severe soft tissue and nerve injury s/p external fixation and irrigation debridement, Mangled R leg s/p R BKA                continue with knee immobilizer    Ok to work on knee ROM with PT/OT   Place back in knee immobilizer after therapy sessions              NWB R stump              Dressing change tomorrow             Continue with vac              Ice and elevate for swelling and pain control      - Pain management:             Continue with current regimen             Added gabapentin post op    - ABL anemia/Hemodynamics             stable             Cbc in am                           -  Medical issues              Per medical service                         Plavix and aspirin are on hold, would like to continue to hold plavix for another 3 days    Workup started for elevated temp and wbc count       - DVT/PE prophylaxis:             SCDs              lovenox    - ID:              ancef completed    - FEN/GI prophylaxis/Foley/Lines:             reg diet             Dc condom catheter      - Dispo:             therapy evals             CIR eval     Mearl LatinKeith W. Jewelianna Pancoast, PA-C [facility] [phone]) [phone] Traci Sermon(O) [phone] (C) 08/21/2017, 10:34 AM

## 2017-08-21 NOTE — Evaluation (Addendum)
Physical Therapy Evaluation Patient Details Name: Charles Patrick MRN: [id number] DOB: June 06, 1924 Today's Date: 08/21/2017   History of Present Illness  Pt is a 84 y/o male s/p R transtibial amputation. 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    Clinical Impression  Pt presented supine in bed with HOB elevated, awake and willing to participate in therapy session. Prior to admission, pt reported that he was independent with all functional mobility and ADLs. Pt lives with his wife and has excellent family support. Pt is very pleasant and motivated to return to independence. Pt currently requires min guard for bed mobility and min A x2 for transfers with RW. Pt would continue to benefit from skilled physical therapy services at this time while admitted and after d/c to address the below listed limitations in order to improve overall safety and independence with functional mobility.     Follow Up Recommendations CIR    Equipment Recommendations  None recommended by PT    Recommendations for Other Services Rehab consult     Precautions / Restrictions Precautions Precautions: Fall Precaution Comments: wound VAC R residual limb Required Braces or Orthoses: Knee Immobilizer - Right Restrictions Weight Bearing Restrictions: Yes RLE Weight Bearing: Non weight bearing      Mobility  Bed Mobility Overal bed mobility: Needs Assistance Bed Mobility: Supine to Sit     Supine to sit: Min guard     General bed mobility comments: increased time and effort, min guard for safety  Transfers Overall transfer level: Needs assistance Equipment used: Rolling walker (2 wheeled) Transfers: Sit to/from [employer] Sit to Stand: Min assist;+2 physical assistance;+2 safety/equipment Stand pivot transfers: Min assist;+2 physical assistance;+2 safety/equipment       General transfer  comment: increased time and effort, cueing for safety hand placement, min A x2 for safety and stability with transitional movements  Ambulation/Gait                Stairs            Wheelchair Mobility    Modified Rankin (Stroke Patients Only)       Balance Overall balance assessment: Needs assistance Sitting-balance support: No upper extremity supported Sitting balance-Leahy Scale: Good     Standing balance support: During functional activity;Bilateral upper extremity supported Standing balance-Leahy Scale: Poor                               Pertinent Vitals/Pain Pain Assessment: No/denies pain Pain Score: 3  Pain Descriptors / Indicators: Sore;Tingling Pain Intervention(s): Monitored during session;Repositioned    Home Living Family/patient expects to be discharged to:: Private residence Living Arrangements: Spouse/significant other Available Help at Discharge: Family;Available 24 hours/day Type of Home: House Home Access: Level entry   Entrance Stairs-Number of Steps: 1 Home Layout: One level Home Equipment: None      Prior Function Level of Independence: Independent         Comments: used no AD for mobility. Was mowing grass when injured     Hand Dominance   Dominant Hand: Right    Extremity/Trunk Assessment   Upper Extremity Assessment Upper Extremity Assessment: Defer to OT evaluation    Lower Extremity Assessment Lower Extremity Assessment: Overall WFL for tasks assessed;RLE deficits/detail RLE Deficits / Details: pt with KI and wound VAC in place. pt able to perform a SLR independently. Pt reported no numbness or  tingling       Communication   Communication: No difficulties  Cognition Arousal/Alertness: Awake/alert Behavior During Therapy: WFL for tasks assessed/performed Overall Cognitive Status: Within Functional Limits for tasks assessed                                        General  Comments      Exercises     Assessment/Plan    PT Assessment Patient needs continued PT services  PT Problem List Decreased strength;Decreased range of motion;Decreased activity tolerance;Decreased balance;Decreased mobility;Decreased coordination;Decreased knowledge of use of DME;Decreased safety awareness;Decreased knowledge of precautions;Pain       PT Treatment Interventions DME instruction;Gait training;Stair training;Functional mobility training;Therapeutic activities;Therapeutic exercise;Balance training;Neuromuscular re-education;Patient/family education    PT Goals (Current goals can be found in the Care Plan section)  Acute Rehab PT Goals Patient Stated Goal: to get better and return to independence PT Goal Formulation: With patient/family Time For Goal Achievement: 09/04/17 Potential to Achieve Goals: Good    Frequency Min 5X/week   Barriers to discharge        Co-evaluation PT/OT/SLP Co-Evaluation/Treatment: Yes Reason for Co-Treatment: For patient/therapist safety;To address functional/ADL transfers PT goals addressed during session: Mobility/safety with mobility;Balance;Proper use of DME;Strengthening/ROM OT goals addressed during session: ADL's and self-care;Proper use of Adaptive equipment and DME       AM-PAC PT "6 Clicks" Daily Activity  Outcome Measure Difficulty turning over in bed (including adjusting bedclothes, sheets and blankets)?: None Difficulty moving from lying on back to sitting on the side of the bed? : None Difficulty sitting down on and standing up from a chair with arms (e.g., wheelchair, bedside commode, etc,.)?: Unable Help needed moving to and from a bed to chair (including a wheelchair)?: A Little Help needed walking in hospital room?: A Little Help needed climbing 3-5 steps with a railing? : Total 6 Click Score: 16    End of Session Equipment Utilized During Treatment: Gait belt Activity Tolerance: Patient tolerated treatment  well Patient left: in chair;with call bell/phone within reach;with chair alarm set;with family/visitor present Nurse Communication: Mobility status PT Visit Diagnosis: Other abnormalities of gait and mobility (R26.89)    Time: 1340-1411 PT Time Calculation (min) (ACUTE ONLY): 31 min   Charges:   PT Evaluation $PT Eval Moderate Complexity: 1 Mod     PT G Codes:        [place]stownJennifer Jacara Benito, PT, DPT [phone]   Alessandra BevelsJennifer M Elonda Giuliano 08/21/2017, 4:01 PM

## 2017-08-22 DIAGNOSIS — E785 Hyperlipidemia, unspecified: Secondary | ICD-10-CM

## 2017-08-22 LAB — CBC
HCT: 25.3 % — ABNORMAL LOW (ref 39.0–52.0)
HEMOGLOBIN: 8.1 g/dL — AB (ref 13.0–17.0)
MCH: 24.1 pg — AB (ref 26.0–34.0)
MCHC: 32 g/dL (ref 30.0–36.0)
MCV: 75.3 fL — ABNORMAL LOW (ref 78.0–100.0)
Platelets: 189 10*3/uL (ref 150–400)
RBC: 3.36 MIL/uL — AB (ref 4.22–5.81)
RDW: 20.5 % — ABNORMAL HIGH (ref 11.5–15.5)
WBC: 15.5 10*3/uL — ABNORMAL HIGH (ref 4.0–10.5)

## 2017-08-22 LAB — BASIC METABOLIC PANEL
Anion gap: 6 (ref 5–15)
BUN: 9 mg/dL (ref 8–23)
CHLORIDE: 97 mmol/L — AB (ref 98–111)
CO2: 28 mmol/L (ref 22–32)
Calcium: 7.7 mg/dL — ABNORMAL LOW (ref 8.9–10.3)
Creatinine, Ser: 1.03 mg/dL (ref 0.61–1.24)
GFR calc Af Amer: >60 mL/min (ref >60)
GFR calc non Af Amer: >60 mL/min (ref >60)
Glucose, Bld: 126 mg/dL — ABNORMAL HIGH (ref 70–99)
POTASSIUM: 4.3 mmol/L (ref 3.5–5.1)
SODIUM: 131 mmol/L — AB (ref 135–145)

## 2017-08-22 NOTE — Plan of Care (Signed)
  Problem: Health Behavior/Discharge Planning: Goal: Ability to manage health-related needs will improve Outcome: Progressing   

## 2017-08-22 NOTE — Progress Notes (Deleted)
Occupational Therapy Treatment Patient Details Name: Charles Patrick MRN: [id number] DOB: Jul 30, 1924 Today's Date: 08/22/2017    History of present illness Pt is a 84 y/o male s/p R transtibial amputation. 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   OT comments  Pt making good progress with functional goals and was able to SPT to [facility] from recliner with RW and cues for safety/correct hand placement. Pt very motivated, pleasant and cooperative. OT will continue to follow acutely  Follow Up Recommendations  CIR    Equipment Recommendations  Other (comment)(TBD at next venue of care)    Recommendations for Other Services Rehab consult    Precautions / Restrictions Precautions Precaution Comments: wound VAC R residual limb Restrictions Weight Bearing Restrictions: Yes RLE Weight Bearing: Non weight bearing       Mobility Bed Mobility               General bed mobility comments: pt up in recliner upon OT arrival  Transfers Overall transfer level: Needs assistance Equipment used: Rolling walker (2 wheeled) Transfers: Sit to/from [employer] Sit to Stand: Min assist Stand pivot transfers: Min assist       General transfer comment: cueing for safe hand placement    Balance Overall balance assessment: Needs assistance Sitting-balance support: No upper extremity supported Sitting balance-Leahy Scale: Good     Standing balance support: During functional activity;Bilateral upper extremity supported Standing balance-Leahy Scale: Poor                             ADL either performed or assessed with clinical judgement   ADL Overall ADL's : Needs assistance/impaired     Grooming: Wash/dry hands;Wash/dry face;Sitting;Supervision/safety;Set up   Upper Body Bathing: Set up;Sitting Upper Body Bathing Details (indicate cue type and reason):  simulated Lower Body Bathing: Maximal assistance;Sitting/lateral leans;Moderate assistance Lower Body Bathing Details (indicate cue type and reason): simulated Upper Body Dressing : Sitting;Set up;Supervision/safety       Toilet Transfer: Minimal assistance;Stand-pivot;RW;BSC   Toileting- Clothing Manipulation and Hygiene: Maximal assistance;Sit to/from stand       Functional mobility during ADLs: Minimal assistance;Rolling walker       Vision Baseline Vision/History: Legally blind Patient Visual Report: No change from baseline     Perception     Praxis      Cognition Arousal/Alertness: Awake/alert Behavior During Therapy: WFL for tasks assessed/performed Overall Cognitive Status: Within Functional Limits for tasks assessed                                          Exercises     Shoulder Instructions       General Comments      Pertinent Vitals/ Pain       Pain Assessment: 0-10 Pain Score: 3  Pain Descriptors / Indicators: Sore;Tingling;Discomfort Pain Intervention(s): Monitored during session;Repositioned;Premedicated before session  Home Living                                          Prior Functioning/Environment              Frequency  Min 2X/week        Progress Toward Goals  OT Goals(current goals can now be found in the care plan section)  Progress towards OT goals: Progressing toward goals     Plan Discharge plan remains appropriate    Co-evaluation                 AM-PAC PT "6 Clicks" Daily Activity     Outcome Measure   Help from another person eating meals?: None Help from another person taking care of personal grooming?: A Little Help from another person toileting, which includes using toliet, bedpan, or urinal?: A Lot Help from another person bathing (including washing, rinsing, drying)?: A Lot Help from another person to put on and taking off regular upper body clothing?: A  Little Help from another person to put on and taking off regular lower body clothing?: Total 6 Click Score: 15    End of Session Equipment Utilized During Treatment: Gait belt;Rolling walker;Other (comment)(BSC)  OT Visit Diagnosis: Unsteadiness on feet (R26.81);Other abnormalities of gait and mobility (R26.89);Muscle weakness (generalized) (M62.81);Pain Pain - Right/Left: Right Pain - part of body: Leg   Activity Tolerance Patient tolerated treatment well   Patient Left in chair;with call bell/phone within reach;with chair alarm set   Nurse Communication          Time: [id number] OT Time Calculation (min): 32 min  Charges:       Galen ManilaSpencer, Keighan Amezcua Jeanette 08/22/2017, 1:02 PM

## 2017-08-22 NOTE — Progress Notes (Signed)
Orthopedic Trauma Service Progress Note   Patient ID: Charles Patrick MRN: [id number] DOB/AGE: 1924-11-22 84 y.o.  Subjective:  Doing well  Sitting up in bedside chair  No complaints at all   Using urinal w/o difficulty   Denies any phantom pain at the moment   Review of Systems  Constitutional: Negative for chills and fever.  Respiratory: Negative for cough and shortness of breath.   Cardiovascular: Negative for chest pain and palpitations.  Gastrointestinal: Negative for abdominal pain, nausea and vomiting.    Objective:   VITALS:   Vitals:   08/21/17 0424 08/21/17 1433 08/21/17 2051 08/22/17 0335  BP: 98/67 97/69 (!) 109/98 99/74  Pulse: 79 92 86 78  Resp: 15 14 18 16   Temp: 97.9 F (36.6 C) 98.3 F (36.8 C) 97.9 F (36.6 C) 98.1 F (36.7 C)  TempSrc: Oral Oral Oral Oral  SpO2: 96% 100% 100% 96%  Weight:      Height:        Estimated body mass index is 21.83 kg/m as calculated from the following:   Height as of this encounter: 6\' 2"  (1.88 m).   Weight as of this encounter: 77.1 kg (170 lb).   Intake/Output      06/26 0701 - 06/27 0700 06/27 0701 - 06/28 0700   P.O. 1080    I.V. (mL/kg)     IV Piggyback 231    Total Intake(mL/kg) 1311 (17)    Urine (mL/kg/hr) 2250 (1.2) 500 (1.5)   Drains 0    Total Output 2250 500   Net -939 -500          LABS  Results for orders placed or performed during the hospital encounter of 08/14/17 (from the past 24 hour(s))  CBC     Status: Abnormal   Collection Time: 08/22/17  3:51 AM  Result Value Ref Range   WBC 15.5 (H) 4.0 - 10.5 K/uL   RBC 3.36 (L) 4.22 - 5.81 MIL/uL   Hemoglobin 8.1 (L) 13.0 - 17.0 g/dL   HCT 46.9 (L) 62.9 - 52.8 %   MCV 75.3 (L) 78.0 - 100.0 fL   MCH 24.1 (L) 26.0 - 34.0 pg   MCHC 32.0 30.0 - 36.0 g/dL   RDW 41.3 (H) 24.4 - 01.0 %   Platelets 189 150 - 400 K/uL  Basic metabolic panel     Status: Abnormal   Collection Time: 08/22/17  3:51 AM  Result Value Ref  Range   Sodium 131 (L) 135 - 145 mmol/L   Potassium 4.3 3.5 - 5.1 mmol/L   Chloride 97 (L) 98 - 111 mmol/L   CO2 28 22 - 32 mmol/L   Glucose, Bld 126 (H) 70 - 99 mg/dL   BUN 9 8 - 23 mg/dL   Creatinine, Ser 2.72 0.61 - 1.24 mg/dL   Calcium 7.7 (L) 8.9 - 10.3 mg/dL   GFR calc non Af Amer >60 >60 mL/min   GFR calc Af Amer >60 >60 mL/min   Anion gap 6 5 - 15     PHYSICAL EXAM:    Gen: resting comfortably in bedside chair, NAD, appears well  Lungs: breathing unlabored  Cardiac: regular  Abd: NT Ext:       Right Lower Extremity   Vac dc'd  Incision looks excellent, mild bloody drainage  No odor, no purulence, no other signs of infection   Ext warm   Good knee ROM    Can get pt to full extension with some  effort  Stump looks well perfused   Assessment/Plan: 3 Days Post-Op   Principal Problem:   Open right ankle fracture Active Problems:   HTN (hypertension)   HLD (hyperlipidemia)   Hypothyroidism   S/P TAVR (transcatheter aortic valve replacement)   Below knee amputation status, right (HCC)   Fever   Fracture   Post-operative pain   Acute blood loss anemia   Anemia of chronic disease   SIRS (systemic inflammatory response syndrome) (HCC)   Anti-infectives (From admission, onward)   Start     Dose/Rate Route Frequency Ordered Stop   08/22/17 0300  vancomycin (VANCOCIN) IVPB 750 mg/150 ml premix     750 mg 150 mL/hr over 60 Minutes Intravenous Every 12 hours 08/21/17 1419     08/21/17 1430  piperacillin-tazobactam (ZOSYN) IVPB 3.375 g     3.375 g 12.5 mL/hr over 240 Minutes Intravenous Every 8 hours 08/21/17 1405     08/21/17 1430  vancomycin (VANCOCIN) 1,500 mg in sodium chloride 0.9 % 500 mL IVPB     1,500 mg 250 mL/hr over 120 Minutes Intravenous  Once 08/21/17 1419 08/21/17 1758   08/19/17 2200  ceFAZolin (ANCEF) IVPB 1 g/50 mL premix     1 g 100 mL/hr over 30 Minutes Intravenous Every 8 hours 08/19/17 1713 08/20/17 1539   08/16/17 1630  ceFAZolin (ANCEF)  IVPB 1 g/50 mL premix  Status:  Discontinued     1 g 100 mL/hr over 30 Minutes Intravenous Every 8 hours 08/16/17 1110 08/19/17 1714   08/16/17 1600  gentamicin (GARAMYCIN) 520 mg in dextrose 5 % 100 mL IVPB  Status:  Discontinued     520 mg 113 mL/hr over 60 Minutes Intravenous Every 36 hours 08/15/17 1623 08/19/17 1025   08/15/17 2200  ceFAZolin (ANCEF) IVPB 1 g/50 mL premix  Status:  Discontinued     1 g 100 mL/hr over 30 Minutes Intravenous Every 8 hours 08/15/17 1418 08/16/17 1110   08/15/17 0400  ceFAZolin (ANCEF) IVPB 1 g/50 mL premix  Status:  Discontinued     1 g 100 mL/hr over 30 Minutes Intravenous Every 6 hours 08/15/17 0226 08/15/17 1418   08/15/17 0245  gentamicin (GARAMYCIN) 520 mg in dextrose 5 % 100 mL IVPB     520 mg 113 mL/hr over 60 Minutes Intravenous  Once 08/15/17 0231 08/15/17 0356   08/14/17 1845  ceFAZolin (ANCEF) IVPB 1 g/50 mL premix     1 g 100 mL/hr over 30 Minutes Intravenous  Once 08/14/17 1840 08/14/17 2034    .  POD/HD#: 71  84 year old active male with open right distal tibia and fibula fracture with tendon and nerve disruption of the posterior compartments s/p irrigation debridement and external fixation   -Complex open right distal tibia fracture with severe soft tissue and nerve injury s/p external fixation and irrigation debridement, Mangled R leg s/p R BKA                dressing changed   Mepitel, 4x4's, abd, stockinette and stump shrinker  Ice and elevate  ROM R knee as tolerated   Ok to be out of knee immobilizer during the day    Place pillow under stump not the knee   Knee immobilizer on at night   Continue PT/OT    Hopeful for CIR in next day or 2  Pt is stable for transfer to CIR    Awaiting insurance auth      - Pain management:  Continue with current regimen                 - ABL anemia/Hemodynamics             stable             Cbc in am      - Medical issues              Per medical service                          Plavix and aspirin are on hold, would like to continue to hold plavix for another 2 days   ? Early L lung PNA vs LLL ATX   Per medicine   abx started    Blood cx pending- No growth to dated    Pt continues to be afebrile    Hyponatremia    ? Related to PNA    [facility]    - DVT/PE prophylaxis:             SCDs              lovenox    - ID:              vanc and zosyn for possible L lung PNA    - FEN/GI prophylaxis/Foley/Lines:             reg diet    - Dispo:             continue with therapy   Stable for CIR     Mearl LatinKeith W. Mackinze Criado, PA-C [facility] [phone](P) [phone] Traci Sermon(O) [phone] (C) 08/22/2017, 11:26 AM

## 2017-08-22 NOTE — Progress Notes (Signed)
Inpatient Rehabilitation Admissions Coordinator  I met with patient at bedside to discuss goals and expectations of an inpt rehab admit. He is in agreement. I await insurance authorization for a possible admit today or tomorrow pending their approval.  Danne Baxter, RN, MSN Rehab Admissions Coordinator [phone] 08/22/2017 1:24 PM

## 2017-08-22 NOTE — Progress Notes (Signed)
PROGRESS NOTE    Charles KaufmannDavid B Steadham  ZOX:[id number] DOB: [birth year] DOA: 08/14/2017 PCP: [name], [name], PA-C     Brief Narrative:  Charles Patrick is a 84 year old male with history of severe aortic stenosis status post TAVR last November, hyperlipidemia, BPH, chronic anemia, hypothyroidism who had injured his right lower extremity with riding lawnmower. Patient has significant bleed and x-rays done in the ER revealed fracture of the tibia. He underwent surgical repair by Dr. Cecilie KicksHewit with 08/15/2017, and eventually underwent right BKA on 08/19/2017 due to significant injury.   New events last 24 hours / Subjective: He has no complaints today. Denies any chest pain, SOB, N/V/D, abdominal pain. He is eating breakfast in bed. He states pain of right lower extremity is well controlled. No fever overnight.   Assessment & Plan:   Principal Problem:   Open right ankle fracture Active Problems:   HTN (hypertension)   HLD (hyperlipidemia)   Hypothyroidism   S/P TAVR (transcatheter aortic valve replacement)   Below knee amputation status, right (HCC)   Fever   Fracture   Post-operative pain   Acute blood loss anemia   Anemia of chronic disease   SIRS (systemic inflammatory response syndrome) (HCC)   Right lower extremity injury with tibial and fibula fracture -Treatment per primary orthopedic team, status post irrigation and excisional debridement, with open reduction of tibia and fibula fracture and application of multiplane external fixator and wound VAC. Patient injury so significant, limb salvage is very hard to achieve, went for right BKA by Dr. Carola FrostHandy on 08/19/2017 -Management and DVT prophylaxis per primary orthopedic team -Dressing change planned by orthopedic surgery today or tomorrow  -CIR eval   Left lobar pneumonia HCAP  -Fever 100.8 on 6/25 afternoon with elevation of leukocytosis 9.8 --> 15.1 --> 15.5 today -Blood cultures pending -CXR revealed LLL atelectasis or early  pneumonia. He was started on vanco/zosyn for HCAP coverage. He remained afebrile overnight. Would continue to treat for short course 5 days and trend fever curve, WBC. Okay from medical standpoint to discharge to CIR and continue to deescalate antibiotics there   Status post TAVR  -Appears to be euvolemic, continue to monitor closely -Hold aspirin/plavix until okay by orthopedic surgery   Hypothyroidism  -Continue Synthroid  Anemia -Microcytic anemia, as well acute blood loss anemia postoperatively, was transfused 1 unit PRBC for hemoglobin of 7.5, with good response, continue to monitor closely and transfuse for hemoglobin less than 8   BPH  -Continue tamsulosin  Hyperlipidemia  -Continue zocor     DVT prophylaxis: Lovenox Code Status: Full Family Communication: No family at bedside Disposition Plan:  Okay from medical standpoint to discharge to CIR and continue to deescalate antibiotics there    Consultants:   Orthopedic surgery  Procedures:  By Dr. Beather ArbourJohn Whitt on 08/15/2017 1. Irrigation and excisional debridement of open right tibia and fibula fractures 2. Complex closure of right leg wounds totaling 25 cm in length 3. Open reduction of tibia and fibula fractures 4. Application of multiplane external fixator 5. Application of wound VAC  Dr. Carola FrostHandy on 08/19/2017 1. Removal of external fixator, right ankle. 2. Right below knee amputation.   Antimicrobials:  Anti-infectives (From admission, onward)   Start     Dose/Rate Route Frequency Ordered Stop   08/22/17 0300  vancomycin (VANCOCIN) IVPB 750 mg/150 ml premix     750 mg 150 mL/hr over 60 Minutes Intravenous Every 12 hours 08/21/17 1419     08/21/17 1430  piperacillin-tazobactam (  ZOSYN) IVPB 3.375 g     3.375 g 12.5 mL/hr over 240 Minutes Intravenous Every 8 hours 08/21/17 1405     08/21/17 1430  vancomycin (VANCOCIN) 1,500 mg in sodium chloride 0.9 % 500 mL IVPB     1,500 mg 250 mL/hr over 120  Minutes Intravenous  Once 08/21/17 1419 08/21/17 1758   08/19/17 2200  ceFAZolin (ANCEF) IVPB 1 g/50 mL premix     1 g 100 mL/hr over 30 Minutes Intravenous Every 8 hours 08/19/17 1713 08/20/17 1539   08/16/17 1630  ceFAZolin (ANCEF) IVPB 1 g/50 mL premix  Status:  Discontinued     1 g 100 mL/hr over 30 Minutes Intravenous Every 8 hours 08/16/17 1110 08/19/17 1714   08/16/17 1600  gentamicin (GARAMYCIN) 520 mg in dextrose 5 % 100 mL IVPB  Status:  Discontinued     520 mg 113 mL/hr over 60 Minutes Intravenous Every 36 hours 08/15/17 1623 08/19/17 1025   08/15/17 2200  ceFAZolin (ANCEF) IVPB 1 g/50 mL premix  Status:  Discontinued     1 g 100 mL/hr over 30 Minutes Intravenous Every 8 hours 08/15/17 1418 08/16/17 1110   08/15/17 0400  ceFAZolin (ANCEF) IVPB 1 g/50 mL premix  Status:  Discontinued     1 g 100 mL/hr over 30 Minutes Intravenous Every 6 hours 08/15/17 0226 08/15/17 1418   08/15/17 0245  gentamicin (GARAMYCIN) 520 mg in dextrose 5 % 100 mL IVPB     520 mg 113 mL/hr over 60 Minutes Intravenous  Once 08/15/17 0231 08/15/17 0356   08/14/17 1845  ceFAZolin (ANCEF) IVPB 1 g/50 mL premix     1 g 100 mL/hr over 30 Minutes Intravenous  Once 08/14/17 1840 08/14/17 2034       Objective: Vitals:   08/21/17 0424 08/21/17 1433 08/21/17 2051 08/22/17 0335  BP: 98/67 97/69 (!) 109/98 99/74  Pulse: 79 92 86 78  Resp: 15 14 18 16   Temp: 97.9 F (36.6 C) 98.3 F (36.8 C) 97.9 F (36.6 C) 98.1 F (36.7 C)  TempSrc: Oral Oral Oral Oral  SpO2: 96% 100% 100% 96%  Weight:      Height:        Intake/Output Summary (Last 24 hours) at 08/22/2017 0826 Last data filed at 08/22/2017 0600 Gross per 24 hour  Intake 1070.97 ml  Output 1550 ml  Net -479.03 ml   Filed Weights   08/14/17 1846  Weight: 77.1 kg (170 lb)    Examination: General exam: Appears calm and comfortable  Eyes: Right eye blind  Respiratory system: Clear to auscultation. Respiratory effort normal. Cardiovascular  system: S1 & S2 heard, RRR. No JVD, murmurs, rubs, gallops or clicks. No pedal edema. Gastrointestinal system: Abdomen is nondistended, soft and nontender. No organomegaly or masses felt. Normal bowel sounds heard. Central nervous system: Alert and oriented. No focal neurological deficits. Extremities: Right BKA with wound vac in place  Skin: No rashes, lesions or ulcers Psychiatry: Judgement and insight appear normal. Mood & affect appropriate.    Data Reviewed: I have personally reviewed following labs and imaging studies  CBC: Recent Labs  Lab 08/19/17 0423 08/19/17 0628 08/20/17 0655 08/21/17 0416 08/22/17 0351  WBC 8.7 8.7 9.8 15.1* 15.5*  HGB 10.0* 9.5* 8.6* 8.6* 8.1*  HCT 31.1* 30.9* 27.5* 27.4* 25.3*  MCV 74.6* 76.9* 75.8* 75.1* 75.3*  PLT PLATELET CLUMPS NOTED ON SMEAR, UNABLE TO ESTIMATE 173 152 171 189   Basic Metabolic Panel: Recent Labs  Lab 08/16/17  9604 08/17/17 0358 08/19/17 0423 08/20/17 0655 08/22/17 0351  NA 137 139 140 136 131*  K 3.9 3.9 3.3* 4.2 4.3  CL 108 107 105 101 97*  CO2 24 27 28 29 28   GLUCOSE 126* 117* 99 116* 126*  BUN 14 9 8 9 9   CREATININE 1.08 0.97 0.84 1.04 1.03  CALCIUM 7.9* 8.0* 8.3* 8.1* 7.7*   GFR: Estimated Creatinine Clearance: 52 mL/min (by C-G formula based on SCr of 1.03 mg/dL). Liver Function Tests: Recent Labs  Lab 08/16/17 0506  AST 21  ALT 13*  ALKPHOS 49  BILITOT 0.5  PROT 5.7*  ALBUMIN 3.1*   No results for input(s): LIPASE, AMYLASE in the last 168 hours. No results for input(s): AMMONIA in the last 168 hours. Coagulation Profile: Recent Labs  Lab 08/16/17 0506  INR 1.25   Cardiac Enzymes: No results for input(s): CKTOTAL, CKMB, CKMBINDEX, TROPONINI in the last 168 hours. BNP (last 3 results) No results for input(s): PROBNP in the last 8760 hours. HbA1C: No results for input(s): HGBA1C in the last 72 hours. CBG: No results for input(s): GLUCAP in the last 168 hours. Lipid Profile: No results for  input(s): CHOL, HDL, LDLCALC, TRIG, CHOLHDL, LDLDIRECT in the last 72 hours. Thyroid Function Tests: No results for input(s): TSH, T4TOTAL, FREET4, T3FREE, THYROIDAB in the last 72 hours. Anemia Panel: No results for input(s): VITAMINB12, FOLATE, FERRITIN, TIBC, IRON, RETICCTPCT in the last 72 hours. Sepsis Labs: Recent Labs  Lab 08/16/17 0506  LATICACIDVEN 1.3    Recent Results (from the past 240 hour(s))  Surgical PCR screen     Status: None   Collection Time: 08/15/17  7:48 AM  Result Value Ref Range Status   MRSA, PCR NEGATIVE NEGATIVE Final   Staphylococcus aureus NEGATIVE NEGATIVE Final    Comment: (NOTE) The Xpert SA Assay (FDA approved for NASAL specimens in patients 65 years of age and older), is one component of a comprehensive surveillance program. It is not intended to diagnose infection nor to guide or monitor treatment. Performed at [facility], [address], [place], [place] [address]        Radiology Studies: Dg Chest Port 1 View  Result Date: 08/21/2017 CLINICAL DATA:  Fever. History of coronary artery disease, aortic stenosis with transcatheter replacement, tricuspid insufficiency EXAM: PORTABLE CHEST 1 VIEW COMPARISON:  Chest x-ray of August 14, 2017 FINDINGS: The lungs are well-expanded. There is no focal infiltrate. There are coarse lung markings at the left lung base slightly more conspicuous today. The heart is normal in size. The prosthetic aortic valve cage is visible and appears stable in position. The pulmonary vascularity is normal. There is faint calcification in the wall of the aortic arch. The observed bony thorax exhibits no acute abnormality. IMPRESSION: Left lower lobe atelectasis or early pneumonia. No CHF. Followup PA and lateral chest X-ray is recommended in 3-4 weeks following trial of antibiotic therapy to ensure resolution and exclude underlying malignancy. Thoracic aortic atherosclerosis. Electronically Signed   By: Damain  [place] M.D.    On: 08/21/2017 12:19      Scheduled Meds: . acetaminophen  1,000 mg Oral Q8H  . bisacodyl  10 mg Rectal UD  . docusate sodium  100 mg Oral BID  . enoxaparin (LOVENOX) injection  40 mg Subcutaneous Q24H  . gabapentin  300 mg Oral BID  . levothyroxine  50 mcg Oral QAC breakfast  . senna-docusate  2 tablet Oral BID  . simvastatin  40 mg Oral  q1800  . tamsulosin  0.4 mg Oral QPC supper   Continuous Infusions: . sodium chloride Stopped (08/21/17 1558)  . lactated ringers 10 mL/hr at 08/19/17 1227  . methocarbamol (ROBAXIN)  IV    . piperacillin-tazobactam (ZOSYN)  IV 3.375 g (08/22/17 0551)  . vancomycin 750 mg (08/22/17 0331)     LOS: 7 days    Time spent: 20 minutes   Noralee Stain, DO [facility] [email or web address] Password [facility] 08/22/2017, 8:26 AM

## 2017-08-22 NOTE — Progress Notes (Signed)
Physical Therapy Treatment Patient Details Name: Charles Patrick MRN: [id number] DOB: [birth year] Today's Date: 08/22/2017    History of Present Illness Pt is a 84 y/o male s/p R transtibial amputation. 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    PT Comments    Pt making steady progress with functional mobility. He remains very positive and motivated to move. Pt tolerated short distance ambulation with RW and min A. Pt would continue to benefit from skilled physical therapy services at this time while admitted and after d/c to address the below listed limitations in order to improve overall safety and independence with functional mobility.    Follow Up Recommendations  CIR     Equipment Recommendations  None recommended by PT    Recommendations for Other Services       Precautions / Restrictions Precautions Precautions: Fall Precaution Comments: wound VAC R residual limb Required Braces or Orthoses: Knee Immobilizer - Right Restrictions Weight Bearing Restrictions: Yes RLE Weight Bearing: Non weight bearing    Mobility  Bed Mobility Overal bed mobility: Needs Assistance Bed Mobility: Supine to Sit     Supine to sit: Min guard     General bed mobility comments: increased time and effort, min guard for safety  Transfers Overall transfer level: Needs assistance Equipment used: Rolling walker (2 wheeled) Transfers: Sit to/from Stand Sit to Stand: Min guard Stand pivot transfers: Min assist       General transfer comment: increased time and effort, bed in elevated position, cueing for hand placement, min guard  Ambulation/Gait Ambulation/Gait assistance: Min assist Gait Distance (Feet): 15 Feet Assistive device: Rolling walker (2 wheeled) Gait Pattern/deviations: (hop-to on L LE) Gait velocity: decreased Gait velocity interpretation: <1.31 ft/sec, indicative of  household ambulator General Gait Details: min A for stability and safety with RW with close chair follow    Stairs             Wheelchair Mobility    Modified Rankin (Stroke Patients Only)       Balance Overall balance assessment: Needs assistance Sitting-balance support: No upper extremity supported Sitting balance-Leahy Scale: Good     Standing balance support: During functional activity;Bilateral upper extremity supported Standing balance-Leahy Scale: Poor                              Cognition Arousal/Alertness: Awake/alert Behavior During Therapy: WFL for tasks assessed/performed Overall Cognitive Status: Within Functional Limits for tasks assessed                                        Exercises Amputee Exercises Hip ABduction/ADduction: AROM;Strengthening;Right;10 reps;Supine Straight Leg Raises: AROM;Strengthening;Right;10 reps;Supine    General Comments        Pertinent Vitals/Pain Pain Assessment: No/denies pain Pain Score: 3  Pain Descriptors / Indicators: Sore;Tingling;Discomfort Pain Intervention(s): Monitored during session;Repositioned;Premedicated before session    Home Living                      Prior Function            PT Goals (current goals can now be found in the care plan section) Acute Rehab PT Goals PT Goal Formulation: With patient/family Time For Goal Achievement: 09/04/17 Potential to Achieve Goals: Good Progress towards PT goals: Progressing toward  goals    Frequency    Min 5X/week      PT Plan Current plan remains appropriate    Co-evaluation              AM-PAC PT "6 Clicks" Daily Activity  Outcome Measure  Difficulty turning over in bed (including adjusting bedclothes, sheets and blankets)?: None Difficulty moving from lying on back to sitting on the side of the bed? : None Difficulty sitting down on and standing up from a chair with arms (e.g., wheelchair,  bedside commode, etc,.)?: Unable Help needed moving to and from a bed to chair (including a wheelchair)?: A Little Help needed walking in hospital room?: A Little Help needed climbing 3-5 steps with a railing? : Total 6 Click Score: 16    End of Session Equipment Utilized During Treatment: Gait belt Activity Tolerance: Patient tolerated treatment well Patient left: in chair;with call bell/phone within reach;with chair alarm set Nurse Communication: Mobility status PT Visit Diagnosis: Other abnormalities of gait and mobility (R26.89)     Time: 1031-1056 PT Time Calculation (min) (ACUTE ONLY): 25 min  Charges:  $Gait Training: 8-22 mins $Therapeutic Activity: 8-22 mins                    G Codes:       Charles Patrick, [place], [place] [phone]    Charles BevelsJennifer M [name] 08/22/2017, 2:29 PM

## 2017-08-22 NOTE — Progress Notes (Signed)
Occupational Therapy Treatment Patient Details Name: Charles Patrick MRN: [id number] DOB: Apr 28, 1924 Today's Date: 08/22/2017    History of present illness Pt is a 84 y/o male s/p R transtibial amputation. 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   OT comments  Pt making good progress with functional goals and was able to SPT to [facility] from recliner with RW and cues for safety/correct hand placement. Pt very motivated, pleasant and cooperative. OT will continue to follow acutely  Follow Up Recommendations  CIR    Equipment Recommendations  Other (comment)(TBD at next venue of care)    Recommendations for Other Services Rehab consult    Precautions / Restrictions Precautions Precaution Comments: wound VAC R residual limb Restrictions Weight Bearing Restrictions: Yes RLE Weight Bearing: Non weight bearing       Mobility Bed Mobility               General bed mobility comments: pt up in recliner upon OT arrival  Transfers Overall transfer level: Needs assistance Equipment used: Rolling walker (2 wheeled) Transfers: Sit to/from [employer] Sit to Stand: Min assist Stand pivot transfers: Min assist       General transfer comment: cueing for safe hand placement    Balance Overall balance assessment: Needs assistance Sitting-balance support: No upper extremity supported Sitting balance-Leahy Scale: Good     Standing balance support: During functional activity;Bilateral upper extremity supported Standing balance-Leahy Scale: Poor                             ADL either performed or assessed with clinical judgement   ADL Overall ADL's : Needs assistance/impaired     Grooming: Wash/dry hands;Wash/dry face;Sitting;Supervision/safety;Set up   Upper Body Bathing: Set up;Sitting Upper Body Bathing Details (indicate cue type and reason):  simulated Lower Body Bathing: Maximal assistance;Sitting/lateral leans;Moderate assistance Lower Body Bathing Details (indicate cue type and reason): simulated Upper Body Dressing : Sitting;Set up;Supervision/safety       Toilet Transfer: Minimal assistance;Stand-pivot;RW;BSC   Toileting- Clothing Manipulation and Hygiene: Maximal assistance;Sit to/from stand       Functional mobility during ADLs: Minimal assistance;Rolling walker       Vision Baseline Vision/History: Legally blind Patient Visual Report: No change from baseline     Perception     Praxis      Cognition Arousal/Alertness: Awake/alert Behavior During Therapy: WFL for tasks assessed/performed Overall Cognitive Status: Within Functional Limits for tasks assessed                                          Exercises     Shoulder Instructions       General Comments      Pertinent Vitals/ Pain       Pain Assessment: 0-10 Pain Score: 3  Pain Descriptors / Indicators: Sore;Tingling;Discomfort Pain Intervention(s): Monitored during session;Repositioned;Premedicated before session  Home Living                                          Prior Functioning/Environment              Frequency  Min 2X/week        Progress Toward Goals  OT Goals(current goals can now be found in the care plan section)  Progress towards OT goals: Progressing toward goals     Plan Discharge plan remains appropriate    Co-evaluation                 AM-PAC PT "6 Clicks" Daily Activity     Outcome Measure   Help from another person eating meals?: None Help from another person taking care of personal grooming?: A Little Help from another person toileting, which includes using toliet, bedpan, or urinal?: A Lot Help from another person bathing (including washing, rinsing, drying)?: A Lot Help from another person to put on and taking off regular upper body clothing?: A  Little Help from another person to put on and taking off regular lower body clothing?: Total 6 Click Score: 15    End of Session Equipment Utilized During Treatment: Gait belt;Rolling walker;Other (comment)(BSC)  OT Visit Diagnosis: Unsteadiness on feet (R26.81);Other abnormalities of gait and mobility (R26.89);Muscle weakness (generalized) (M62.81);Pain Pain - Right/Left: Right Pain - part of body: Leg   Activity Tolerance Patient tolerated treatment well   Patient Left in chair;with call bell/phone within reach;with chair alarm set   Nurse Communication      Functional Assessment Tool Used: AM-PAC 6 Clicks Daily Activity   Time: [id number] OT Time Calculation (min): 32 min  Charges: OT G-codes **NOT FOR INPATIENT CLASS** Functional Assessment Tool Used: AM-PAC 6 Clicks Daily Activity OT General Charges $OT Visit: 1 Visit OT Treatments $Self Care/Home Management : 8-22 mins $Therapeutic Activity: 8-22 mins     Galen ManilaSpencer, Rhyli Depaula Jeanette 08/22/2017, 1:05 PM

## 2017-08-23 DIAGNOSIS — E039 Hypothyroidism, unspecified: Secondary | ICD-10-CM

## 2017-08-23 LAB — CBC WITH DIFFERENTIAL/PLATELET
Abs Immature Granulocytes: 0.1 10*3/uL (ref 0.0–0.1)
BASOS ABS: 0 10*3/uL (ref 0.0–0.1)
Basophils Relative: 0 %
EOS ABS: 0.2 10*3/uL (ref 0.0–0.7)
EOS PCT: 1 %
HCT: 24.6 % — ABNORMAL LOW (ref 39.0–52.0)
Hemoglobin: 7.8 g/dL — ABNORMAL LOW (ref 13.0–17.0)
Immature Granulocytes: 1 %
Lymphocytes Relative: 15 %
Lymphs Abs: 1.8 10*3/uL (ref 0.7–4.0)
MCH: 23.8 pg — ABNORMAL LOW (ref 26.0–34.0)
MCHC: 31.7 g/dL (ref 30.0–36.0)
MCV: 75 fL — ABNORMAL LOW (ref 78.0–100.0)
MONO ABS: 1.2 10*3/uL — AB (ref 0.1–1.0)
Monocytes Relative: 10 %
Neutro Abs: 8.6 10*3/uL — ABNORMAL HIGH (ref 1.7–7.7)
Neutrophils Relative %: 73 %
Platelets: 223 10*3/uL (ref 150–400)
RBC: 3.28 MIL/uL — AB (ref 4.22–5.81)
RDW: 20.2 % — AB (ref 11.5–15.5)
WBC: 11.9 10*3/uL — AB (ref 4.0–10.5)

## 2017-08-23 LAB — TYPE AND SCREEN
ABO/RH(D): O POS
Antibody Screen: NEGATIVE
Unit division: 0
Unit division: 0

## 2017-08-23 LAB — BASIC METABOLIC PANEL
Anion gap: 9 (ref 5–15)
BUN: 9 mg/dL (ref 8–23)
CO2: 28 mmol/L (ref 22–32)
CREATININE: 1.04 mg/dL (ref 0.61–1.24)
Calcium: 8.2 mg/dL — ABNORMAL LOW (ref 8.9–10.3)
Chloride: 98 mmol/L (ref 98–111)
GFR calc non Af Amer: >60 mL/min (ref >60)
Glucose, Bld: 100 mg/dL — ABNORMAL HIGH (ref 70–99)
Potassium: 3.7 mmol/L (ref 3.5–5.1)
Sodium: 135 mmol/L (ref 135–145)

## 2017-08-23 LAB — BPAM RBC
BLOOD PRODUCT EXPIRATION DATE: 201907222359
Blood Product Expiration Date: 201907222359
UNIT TYPE AND RH: 5100
Unit Type and Rh: 5100

## 2017-08-23 LAB — PREPARE RBC (CROSSMATCH)

## 2017-08-23 MED ORDER — GABAPENTIN 300 MG PO CAPS: 300.0000 mg | ORAL_CAPSULE | Freq: Two times a day (BID) | ORAL | 0 refills | Status: DC

## 2017-08-23 MED ORDER — ENOXAPARIN SODIUM 40 MG/0.4ML ~~LOC~~ SOLN: 40.0000 mg | SUBCUTANEOUS | 0 refills | Status: DC

## 2017-08-23 MED ORDER — OXYCODONE HCL 5 MG PO TABS: 5.0000 mg | ORAL_TABLET | Freq: Four times a day (QID) | ORAL | 0 refills | Status: DC | PRN

## 2017-08-23 MED ORDER — ACETAMINOPHEN 500 MG PO TABS: 1000.0000 mg | ORAL_TABLET | Freq: Three times a day (TID) | ORAL | 0 refills | Status: DC

## 2017-08-23 MED ORDER — ASCORBIC ACID 500 MG PO TABS: 500.0000 mg | ORAL_TABLET | Freq: Every day | ORAL | 1 refills | Status: AC

## 2017-08-23 MED ORDER — DOXYCYCLINE HYCLATE 100 MG PO TABS
100.0000 mg | ORAL_TABLET | Freq: Two times a day (BID) | ORAL | Status: DC
  Administered 2017-08-23: 100 mg via ORAL
  Filled 2017-08-23: qty 1

## 2017-08-23 MED ORDER — DOXYCYCLINE HYCLATE 100 MG PO TABS: 100.0000 mg | ORAL_TABLET | Freq: Two times a day (BID) | ORAL | 0 refills | Status: AC

## 2017-08-23 MED ORDER — DOCUSATE SODIUM 100 MG PO CAPS: 100.0000 mg | ORAL_CAPSULE | Freq: Two times a day (BID) | ORAL | 0 refills | Status: AC

## 2017-08-23 MED ORDER — VITAMIN C 500 MG PO TABS: 500.0000 mg | ORAL_TABLET | Freq: Every day | ORAL | Status: DC

## 2017-08-23 MED ORDER — SODIUM CHLORIDE 0.9% IV SOLUTION
Freq: Once | INTRAVENOUS | Status: AC
  Administered 2017-08-23: 11:00:00 via INTRAVENOUS

## 2017-08-23 NOTE — Progress Notes (Addendum)
Orthopedic Trauma Service Progress Note   Patient ID: Charles Patrick MRN: [id number] DOB/AGE: 82/10/1924 82 y.o.  Subjective:  Doing great No complaints Very appreciative   Still denies phantom pain   Denied for CIR  Ready to go to SNF   Review of Systems  Constitutional: Negative for chills and fever.  Respiratory: Negative for shortness of breath and wheezing.   Cardiovascular: Negative for chest pain and palpitations.  Gastrointestinal: Negative for nausea and vomiting.  Neurological: Negative for tingling.    Objective:   VITALS:   Vitals:   08/23/17 0537 08/23/17 1100 08/23/17 1125 08/23/17 1415  BP: 99/70 101/64 96/74 93/67   Pulse: 64 71 69 70  Resp: 20 20 18 20   Temp: 98.2 F (36.8 C) 98.4 F (36.9 C) 98.8 F (37.1 C) 98.4 F (36.9 C)  TempSrc: Oral Oral Oral Oral  SpO2: 100% 100% 96% 97%  Weight:      Height:        Estimated body mass index is 21.83 kg/m as calculated from the following:   Height as of this encounter: 6\' 2"  (1.88 m).   Weight as of this encounter: 77.1 kg (170 lb).   Intake/Output      06/27 0701 - 06/28 0700 06/28 0701 - 06/29 0700   P.O. 720    I.V. (mL/kg)  100 (1.3)   Blood  315   IV Piggyback     Total Intake(mL/kg) 720 (9.3) 415 (5.4)   Urine (mL/kg/hr) 2700 (1.5) 650 (1)   Drains     Total Output 2700 650   Net -1980 -235          LABS  Results for orders placed or performed during the hospital encounter of 08/14/17 (from the past 24 hour(s))  CBC with Differential/Platelet     Status: Abnormal   Collection Time: 08/23/17  5:24 AM  Result Value Ref Range   WBC 11.9 (H) 4.0 - 10.5 K/uL   RBC 3.28 (L) 4.22 - 5.81 MIL/uL   Hemoglobin 7.8 (L) 13.0 - 17.0 g/dL   HCT 40.9 (L) 81.1 - 91.4 %   MCV 75.0 (L) 78.0 - 100.0 fL   MCH 23.8 (L) 26.0 - 34.0 pg   MCHC 31.7 30.0 - 36.0 g/dL   RDW 78.2 (H) 95.6 - 21.3 %   Platelets 223 150 - 400 K/uL   Neutrophils Relative % 73 %   Neutro Abs 8.6  (H) 1.7 - 7.7 K/uL   Lymphocytes Relative 15 %   Lymphs Abs 1.8 0.7 - 4.0 K/uL   Monocytes Relative 10 %   Monocytes Absolute 1.2 (H) 0.1 - 1.0 K/uL   Eosinophils Relative 1 %   Eosinophils Absolute 0.2 0.0 - 0.7 K/uL   Basophils Relative 0 %   Basophils Absolute 0.0 0.0 - 0.1 K/uL   Immature Granulocytes 1 %   Abs Immature Granulocytes 0.1 0.0 - 0.1 K/uL  Basic metabolic panel     Status: Abnormal   Collection Time: 08/23/17  5:24 AM  Result Value Ref Range   Sodium 135 135 - 145 mmol/L   Potassium 3.7 3.5 - 5.1 mmol/L   Chloride 98 98 - 111 mmol/L   CO2 28 22 - 32 mmol/L   Glucose, Bld 100 (H) 70 - 99 mg/dL   BUN 9 8 - 23 mg/dL   Creatinine, Ser 0.86 0.61 - 1.24 mg/dL   Calcium 8.2 (L) 8.9 - 10.3 mg/dL   GFR calc non Af Amer >60 >  60 mL/min   GFR calc Af Amer >60 >60 mL/min   Anion gap 9 5 - 15  Prepare RBC     Status: None   Collection Time: 08/23/17  8:49 AM  Result Value Ref Range   Order Confirmation      ORDER PROCESSED BY BLOOD BANK Performed at [facility], [address] [place], [place] [address]   Type and screen [facility]     Status: None (Preliminary result)   Collection Time: 08/23/17  8:50 AM  Result Value Ref Range   ABO/RH(D) O POS    Antibody Screen NEG    Sample Expiration 08/26/2017    Unit Number [id number]    Blood Component Type RED CELLS,LR    Unit division 00    Status of Unit ISSUED    Transfusion Status OK TO TRANSFUSE    Crossmatch Result      Compatible Performed at [facility], [address] [place], [place] [address]      PHYSICAL EXAM:   Gen: awake and alert, sitting up in bed, NAD, appears well Lungs: breathing unlabored  Cardiac: RRR Abd: + BS, NTND Ext:       Right Lower Extremity   shrinker fitting well on stump   Ext warm   Great hip flexion   Flexes knee well  Ext warm   Swelling controlled   Assessment/Plan: 4 Days Post-Op   Principal Problem:   Open right ankle  fracture Active Problems:   HTN (hypertension)   HLD (hyperlipidemia)   Hypothyroidism   S/P TAVR (transcatheter aortic valve replacement)   Below knee amputation status, right (HCC)   Fever   Fracture   Post-operative pain   Acute blood loss anemia   Anemia of chronic disease   SIRS (systemic inflammatory response syndrome) (HCC)   Anti-infectives (From admission, onward)   Start     Dose/Rate Route Frequency Ordered Stop   08/22/17 0300  vancomycin (VANCOCIN) IVPB 750 mg/150 ml premix     750 mg 150 mL/hr over 60 Minutes Intravenous Every 12 hours 08/21/17 1419     08/21/17 1430  piperacillin-tazobactam (ZOSYN) IVPB 3.375 g     3.375 g 12.5 mL/hr over 240 Minutes Intravenous Every 8 hours 08/21/17 1405     08/21/17 1430  vancomycin (VANCOCIN) 1,500 mg in sodium chloride 0.9 % 500 mL IVPB     1,500 mg 250 mL/hr over 120 Minutes Intravenous  Once 08/21/17 1419 08/21/17 1758   08/19/17 2200  ceFAZolin (ANCEF) IVPB 1 g/50 mL premix     1 g 100 mL/hr over 30 Minutes Intravenous Every 8 hours 08/19/17 1713 08/20/17 1539   08/16/17 1630  ceFAZolin (ANCEF) IVPB 1 g/50 mL premix  Status:  Discontinued     1 g 100 mL/hr over 30 Minutes Intravenous Every 8 hours 08/16/17 1110 08/19/17 1714   08/16/17 1600  gentamicin (GARAMYCIN) 520 mg in dextrose 5 % 100 mL IVPB  Status:  Discontinued     520 mg 113 mL/hr over 60 Minutes Intravenous Every 36 hours 08/15/17 1623 08/19/17 1025   08/15/17 2200  ceFAZolin (ANCEF) IVPB 1 g/50 mL premix  Status:  Discontinued     1 g 100 mL/hr over 30 Minutes Intravenous Every 8 hours 08/15/17 1418 08/16/17 1110   08/15/17 0400  ceFAZolin (ANCEF) IVPB 1 g/50 mL premix  Status:  Discontinued     1 g 100 mL/hr over 30 Minutes Intravenous Every  6 hours 08/15/17 0226 08/15/17 1418   08/15/17 0245  gentamicin (GARAMYCIN) 520 mg in dextrose 5 % 100 mL IVPB     520 mg 113 mL/hr over 60 Minutes Intravenous  Once 08/15/17 0231 08/15/17 0356   08/14/17 1845   ceFAZolin (ANCEF) IVPB 1 g/50 mL premix     1 g 100 mL/hr over 30 Minutes Intravenous  Once 08/14/17 1840 08/14/17 2034    .  POD/HD#: 22  84 year old active male with open right distal tibia and fibula fracture with tendon and nerve disruption of the posterior compartments s/p irrigation debridement and external fixation   -Complex open right distal tibia fracture with severe soft tissue and nerve injury s/p external fixation and irrigation debridement, Mangled R leg s/p R BKA                dressing changes every other day                          Mepitel or adaptic, 4x4's, abd, stockinette and stump shrinker             Ice and elevate             ROM R knee as tolerated                         Ok to be out of knee immobilizer during the day                          Place pillow under stump not the knee                         Knee immobilizer on at night              Continue PT/OT                          SNF today   Follow up with ortho in 5-7 days      - Pain management:             Continue with current regimen     - ABL anemia/Hemodynamics             mild drift downwards  Vitals stable  Pt asymptomatic with therapies   Pt did get one additional unit of PRBCs today      - Medical issues              Per medical service                         Plavix and aspirin will be restarted at dc                ? Early L lung PNA vs LLL ATX                         Per medicine                         abx started- medicine recommends doxy at dc for another 3 days                          Blood cx pending- No growth to  date                           Pt continues to be afebrile    WBC count trending downward    Pt w/o any other complaints or symptoms      - DVT/PE prophylaxis:             SCDs              lovenox to x 7 more days    - ID:              vanc and zosyn for possible L lung PNA     Doxycycline at dc x 3 more days      - FEN/GI  prophylaxis/Foley/Lines:             reg diet    - Dispo:             SNF today  Follow up with ortho in 5-7 days   Mearl Latin, PA-C [facility] [phone] (P) [phone] Traci Sermon (C) 08/23/2017, 3:03 PM

## 2017-08-23 NOTE — Progress Notes (Signed)
PROGRESS NOTE  Charles Patrick AVW:[id number] DOB: 1924/12/21 DOA: 08/14/2017 PCP: [name], PA-C   LOS: 8 days   Brief Narrative / Interim history: 84 yo M with severe AS post TAVR, HLD, chronic anemia, hypothyroidism, admitted with RLE injury. He underwent surgical repair by Dr. [name] on 6/20, and eventually needed right BKA on 6/24 due to significant injury.   Assessment & Plan: Principal Problem:   Open right ankle fracture Active Problems:   HTN (hypertension)   HLD (hyperlipidemia)   Hypothyroidism   S/P TAVR (transcatheter aortic valve replacement)   Below knee amputation status, right (HCC)   Fever   Fracture   Post-operative pain   Acute blood loss anemia   Anemia of chronic disease   SIRS (systemic inflammatory response syndrome) (HCC)   Right lower extremity injury with tibial and fibula fracture -Treatment per primary orthopedic team, status post irrigation and excisional debridement, with open reduction of tibia and fibula fracture and application of multiplane external fixator and wound VAC.Patient injury so significant, limb salvage is very hard to achieve,went for right BKA by Dr. [name] on 08/19/2017 -Management and DVT prophylaxis per primary orthopedic team -insurance denies CIR, SW to pursue SNF.   Left lobar pneumonia HCAP  -Fever 100.8 on 6/25 afternoon with leukocytosis -Blood cultures pending, no growth to date -CXR revealed LLL atelectasis or early pneumonia. He was started on vanco/zosyn for HCAP coverage. He remained afebrile overnight. Would continue to treat for short course 5 days, today day 3 -can narrow to doxy on d/c   Status post TAVR  -Appears to be euvolemic, continue to monitor closely -Hold aspirin/plavix until okay by orthopedic surgery   Hypothyroidism  -Continue Synthroid  Anemia -Microcytic anemia, as well acute blood loss anemia postoperatively, was transfused 1 unit PRBC for hemoglobin of 7.5, with good response,  continue to monitor closely and transfuse for hemoglobin less than 8  -transfuse again today for Hb < 8. No bleeding, if he has a SNF bed OK for d/c today after transfusion  BPH  -Continue tamsulosin  Hyperlipidemia  -Continue zocor   OK to d/c when bed available. Recommend repeat a CBC in 3-4 days in SNF.  DVT prophylaxis: Lovenox Code Status: Full  Family Communication: no family present Disposition Plan: OK to SNF d/c  Procedures:  By Dr. Beather Arbour on 08/15/2017 1. Irrigation and excisional debridement of open right tibia and fibula fractures 2. Complex closure of right leg wounds totaling 25 cm in length 3. Open reduction of tibia and fibula fractures 4. Application of multiplane external fixator 5. Application of wound VAC  Dr. Daine Gravel 08/19/2017 1. Removal of external fixator, right ankle. 2. Right below knee amputation.   Antimicrobials:  Vancomycin 6/26 >> plan to finish 7/1  Zosyn 6/26 >>  Plan to finish 7/1  Subjective: - no chest pain, shortness of breath, no abdominal pain, nausea or vomiting.   Objective: Vitals:   08/22/17 0335 08/22/17 1410 08/22/17 1953 08/23/17 0537  BP: 99/74 94/63 93/68  99/70  Pulse: 78 76 71 64  Resp: 16 14 17 20   Temp: 98.1 F (36.7 C) 98.3 F (36.8 C) 99.2 F (37.3 C) 98.2 F (36.8 C)  TempSrc: Oral Oral Oral Oral  SpO2: 96% 98% 96% 100%  Weight:      Height:        Intake/Output Summary (Last 24 hours) at 08/23/2017 0820 Last data filed at 08/23/2017 0600 Gross per 24 hour  Intake 720 ml  Output  2700 ml  Net -1980 ml   Filed Weights   08/14/17 1846  Weight: 77.1 kg (170 lb)    Examination:  Constitutional: NAD Eyes:  lids and conjunctivae normal ENMT: Mucous membranes are moist.  Respiratory: clear to auscultation bilaterally, no wheezing, no crackles. Normal respiratory effort. No accessory muscle use.  Cardiovascular: Regular rate and rhythm, no murmurs / rubs / gallops. No LE edema.  Abdomen:  no tenderness. Bowel sounds positive.  Skin: no rashes Neurologic: non focal  Psychiatric: Normal judgment and insight. Alert and oriented x 3. Normal mood.    Data Reviewed: I have independently reviewed following labs and imaging studies   CBC: Recent Labs  Lab 08/19/17 0628 08/20/17 0655 08/21/17 0416 08/22/17 0351 08/23/17 0524  WBC 8.7 9.8 15.1* 15.5* 11.9*  NEUTROABS  --   --   --   --  8.6*  HGB 9.5* 8.6* 8.6* 8.1* 7.8*  HCT 30.9* 27.5* 27.4* 25.3* 24.6*  MCV 76.9* 75.8* 75.1* 75.3* 75.0*  PLT 173 152 171 189 223   Basic Metabolic Panel: Recent Labs  Lab 08/17/17 0358 08/19/17 0423 08/20/17 0655 08/22/17 0351 08/23/17 0524  NA 139 140 136 131* 135  K 3.9 3.3* 4.2 4.3 3.7  CL 107 105 101 97* 98  CO2 27 28 29 28 28   GLUCOSE 117* 99 116* 126* 100*  BUN 9 8 9 9 9   CREATININE 0.97 0.84 1.04 1.03 1.04  CALCIUM 8.0* 8.3* 8.1* 7.7* 8.2*   GFR: Estimated Creatinine Clearance: 51.5 mL/min (by C-G formula based on SCr of 1.04 mg/dL). Liver Function Tests: No results for input(s): AST, ALT, ALKPHOS, BILITOT, PROT, ALBUMIN in the last 168 hours. No results for input(s): LIPASE, AMYLASE in the last 168 hours. No results for input(s): AMMONIA in the last 168 hours. Coagulation Profile: No results for input(s): INR, PROTIME in the last 168 hours. Cardiac Enzymes: No results for input(s): CKTOTAL, CKMB, CKMBINDEX, TROPONINI in the last 168 hours. BNP (last 3 results) No results for input(s): PROBNP in the last 8760 hours. HbA1C: No results for input(s): HGBA1C in the last 72 hours. CBG: No results for input(s): GLUCAP in the last 168 hours. Lipid Profile: No results for input(s): CHOL, HDL, LDLCALC, TRIG, CHOLHDL, LDLDIRECT in the last 72 hours. Thyroid Function Tests: No results for input(s): TSH, T4TOTAL, FREET4, T3FREE, THYROIDAB in the last 72 hours. Anemia Panel: No results for input(s): VITAMINB12, FOLATE, FERRITIN, TIBC, IRON, RETICCTPCT in the last 72  hours. Urine analysis:    Component Value Date/Time   COLORURINE YELLOW 08/21/2017 0734   APPEARANCEUR CLEAR 08/21/2017 0734   LABSPEC 1.013 08/21/2017 0734   PHURINE 6.0 08/21/2017 0734   GLUCOSEU NEGATIVE 08/21/2017 0734   HGBUR MODERATE (A) 08/21/2017 0734   BILIRUBINUR NEGATIVE 08/21/2017 0734   KETONESUR NEGATIVE 08/21/2017 0734   PROTEINUR NEGATIVE 08/21/2017 0734   NITRITE NEGATIVE 08/21/2017 0734   LEUKOCYTESUR NEGATIVE 08/21/2017 0734   Sepsis Labs: Invalid input(s): PROCALCITONIN, LACTICIDVEN  Recent Results (from the past 240 hour(s))  Surgical PCR screen     Status: None   Collection Time: 08/15/17  7:48 AM  Result Value Ref Range Status   MRSA, PCR NEGATIVE NEGATIVE Final   Staphylococcus aureus NEGATIVE NEGATIVE Final    Comment: (NOTE) The Xpert SA Assay (FDA approved for NASAL specimens in patients 16 years of age and older), is one component of a comprehensive surveillance program. It is not intended to diagnose infection nor to guide or monitor treatment. Performed at  [facility], [address] [place], [place] [address]   Culture, blood (routine x 2)     Status: None (Preliminary result)   Collection Time: 08/21/17  7:45 AM  Result Value Ref Range Status   Specimen Description BLOOD RIGHT ANTECUBITAL  Final   Special Requests   Final    BOTTLES DRAWN AEROBIC ONLY Blood Culture adequate volume   Culture   Final    NO GROWTH 1 DAY Performed at [facility], [address] [place], [place] [address]    Report Status PENDING  Incomplete  Culture, blood (routine x 2)     Status: None (Preliminary result)   Collection Time: 08/21/17  7:51 AM  Result Value Ref Range Status   Specimen Description BLOOD RIGHT ANTECUBITAL  Final   Special Requests   Final    BOTTLES DRAWN AEROBIC ONLY Blood Culture adequate volume   Culture   Final    NO GROWTH 1 DAY Performed at [facility], [address] [place], [place] [address]    Report  Status PENDING  Incomplete      Radiology Studies: Dg Chest Port 1 View  Result Date: 08/21/2017 CLINICAL DATA:  Fever. History of coronary artery disease, aortic stenosis with transcatheter replacement, tricuspid insufficiency EXAM: PORTABLE CHEST 1 VIEW COMPARISON:  Chest x-ray of August 14, 2017 FINDINGS: The lungs are well-expanded. There is no focal infiltrate. There are coarse lung markings at the left lung base slightly more conspicuous today. The heart is normal in size. The prosthetic aortic valve cage is visible and appears stable in position. The pulmonary vascularity is normal. There is faint calcification in the wall of the aortic arch. The observed bony thorax exhibits no acute abnormality. IMPRESSION: Left lower lobe atelectasis or early pneumonia. No CHF. Followup PA and lateral chest X-ray is recommended in 3-4 weeks following trial of antibiotic therapy to ensure resolution and exclude underlying malignancy. Thoracic aortic atherosclerosis. Electronically Signed   By: Ysabel  [place]azil[place] M.D.   On: 08/21/2017 12:19     Scheduled Meds: . sodium chloride   Intravenous Once  . acetaminophen  1,000 mg Oral Q8H  . bisacodyl  10 mg Rectal UD  . docusate sodium  100 mg Oral BID  . enoxaparin (LOVENOX) injection  40 mg Subcutaneous Q24H  . gabapentin  300 mg Oral BID  . levothyroxine  50 mcg Oral QAC breakfast  . senna-docusate  2 tablet Oral BID  . simvastatin  40 mg Oral q1800  . tamsulosin  0.4 mg Oral QPC supper   Continuous Infusions: . sodium chloride Stopped (08/21/17 1558)  . lactated ringers 10 mL/hr at 08/19/17 1227  . methocarbamol (ROBAXIN)  IV    . piperacillin-tazobactam (ZOSYN)  IV 3.375 g (08/23/17 0544)  . vancomycin 750 mg (08/23/17 0331)   Pamella Pertostin Carmaleta Youngers, MD, PhD [facility] Pager [phone]  If 7PM-7AM, please contact night-coverage [email or web address] Password [email or web address] 08/23/2017, 8:20 AM

## 2017-08-23 NOTE — Clinical Social Work Note (Addendum)
CSW has received insurance authorization [facility]. Facility to fax paperwork to CSW in order for pt to complete. Facility must have summary by 4:00 today.  [place] Vivan Agostino, [place] [phone]

## 2017-08-23 NOTE — Progress Notes (Signed)
Inpatient Rehabilitation Admissions Coordinator  I have received a denial form [employer] medicare for an inpt rehab admission. He is doing well and they feel his care can be managed at a lower level of care. I met with pt at bedside and contacted his son, Ludwig Clarks, by phone per his request. Ludwig Clarks is aware of denial and aware that SNF rehab needs to be pursued. He is concerned for his Dad to be at [facility] for only 2 to 3 weeks. He feels him coming home before he gets his prosthesis is not realistic with his Dad and Mom being older. I have reassured him of his Dad's good progress. I will alert RN CM and SW of need for SNF. We will sign off at this time.  Danne Baxter, RN, MSN Rehab Admissions Coordinator [phone] 08/23/2017 8:11 AM

## 2017-08-23 NOTE — Discharge Summary (Signed)
Orthopaedic Trauma Service (OTS)  Patient ID: Charles Patrick MRN: [id number] DOB/AGE: [id number] 82 y.o.  Admit date: 08/14/2017 Discharge date: 08/23/2017  Admission Diagnoses: Complex right distal tibia and fibula fractures Complex soft tissue wound right leg Severe bone loss right tibia Hypertension Hyperlipidemia Hypothyroidism Status post transcatheter aortic valve replacement  Discharge Diagnoses:  Principal Problem:   Open right tibial fracture Active Problems:   HTN (hypertension)   HLD (hyperlipidemia)   Hypothyroidism   S/P TAVR (transcatheter aortic valve replacement)   Below knee amputation status, right (HCC)   Acute blood loss anemia   Anemia of chronic disease   SIRS (systemic inflammatory response syndrome) (HCC)   Past Medical History:  Diagnosis Date  . Anemia   . Aortic valve stenosis   . Arthropathy   . BPH associated with nocturia   . Carotid artery disease (HCC)   . Colon polyp   . Dilated aortic root (HCC)   . Elevated PSA   . Erectile dysfunction   . Gastric paresis   . Hypercholesterolemia   . Hyperglycemia   . Hypertension    Patient denies  . Hypothyroid   . Iron deficiency   . Pulmonary hypertension (HCC)   . 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 Seizure disorder (HCC)   . Thrombocytopenia (HCC)   . Thyroid nodule   . Tricuspid regurgitation   . Vitamin D deficiency      Procedures Performed: 08/14/2017- Dr. Victorino Patrick  1.  Irrigation and excisional debridement of open right tibia and fibula fractures 2.  Complex closure of right leg wounds totaling 25 cm in length 3.  Open reduction of tibia and fibula fractures 4.  Application of multiplane external fixator 5.  Application of wound VAC  08/16/2017- Dr. Carola Patrick   1.  Irrigation and debridement of open tibia and fibula fractures including removal  of bone and tendon. 2.  Dressing change under anesthesia, right leg.  08/19/2017- Dr. Carola Patrick  1.  Removal of external fixator, right ankle. 2.  Right below knee amputation.   Discharged Condition: good  Hospital Course:   Patient is a very pleasant 84 year old African-American male who was admitted on 08/14/2017 after sustaining a traumatic injury to his right leg after being involved in an accident with his lawnmower.  Please see H&P and consult notes for full summary.  Patient was taken to the operating room on 3 separate occasions.  On the day of presentation he was taken to the OR for initial irrigation debridement as well as application of a spanning external fixator.  Due to the complexity of the injury the orthopedic trauma service was consulted for definitive management following his initial I&D.  Patient was taken to the operating room once again on 08/16/2017 by the orthopedic trauma service for formal evaluation of his wound and injury.  Upon further evaluation in the operating room we felt that patient would require right below-knee amputation in order to preserve his level of function and give him the best opportunity recovery as quick as possible.  With that we decided to perform I&D and application event wound VAC once again and allow the patient time to process this along with his family.  Over the weekend this was discussed with patient's family and the patient and they wish to proceed with amputation on 08/19/2017.  Patient was taken to the OR on 08/19/2017 where right below-knee  amputation was performed.  Patient was covered with Ancef and gentamicin for his open fracture.  He was then covered with Ancef for 24 hours following his right BKA.  Patient tolerated the procedure very well.  After surgery he was transferred back to the orthopedic floor for continued observation and to begin therapies.  We did initially think that the patient would be a candidate for inpatient rehab however he he  was denied by his insurance company and will be discharged to skilled nursing center.  Patient's dressing was changed on 08/22/2017, his stump looks fantastic his suture line is healing very nicely with very scant bloody drainage.  There is no signs of infection.  Moderate swelling still persists but was expected for his surgery.  Patient discharged on 08/23/2017 in stable condition.  Discharge patient was mobilizing well with therapy.  He was voiding without difficulty tolerating a regular diet.   Patient's hospital course was fairly uncomplicated he did spike a very mild elevated temperature of 100.8 F on 08/21/2017 the medical service worked this up and there was a question of possible early left lower lobe pneumonia versus atelectasis.  Patient was started on broad-spectrum IV antibiotics including vancomycin and Zosyn.  Per recommendations of the medical service he will be discharged on doxycycline for another 3 days.  Patient has been afebrile for more than 48 hours and not experiencing any other signs or symptoms of active infection.  Patient's Plavix and aspirin was held during his hospitalization as well he was covered with Lovenox for DVT and PE prophylaxis.  His Plavix and aspirin will be resumed at discharge and he will remain on Lovenox for another 7 days.  I would anticipate referring the patient to the prosthetists in about 14 days to begin evaluation for prosthesis.  He will likely need a new set of stumps drinkers at his next visit as I would anticipate his swelling to be markedly improved.  Consults: rehabilitation medicine and Internal medicine  Significant Diagnostic Studies: labs:   Results for Charles, Patrick (MRN [id number]) as of 08/23/2017 15:46  Ref. Range 08/23/2017 05:24  Sodium Latest Ref Range: 135 - 145 mmol/L 135  Potassium Latest Ref Range: 3.5 - 5.1 mmol/L 3.7  Chloride Latest Ref Range: 98 - 111 mmol/L 98  CO2 Latest Ref Range: 22 - 32 mmol/L 28  Glucose Latest Ref  Range: 70 - 99 mg/dL 409 (H)  BUN Latest Ref Range: 8 - 23 mg/dL 9  Creatinine Latest Ref Range: 0.61 - 1.24 mg/dL 8.11  Calcium Latest Ref Range: 8.9 - 10.3 mg/dL 8.2 (L)  Anion gap Latest Ref Range: 5 - 15  9  GFR, Est Non African American Latest Ref Range: >60 mL/min >60  GFR, Est African American Latest Ref Range: >60 mL/min >60  WBC Latest Ref Range: 4.0 - 10.5 K/uL 11.9 (H)  RBC Latest Ref Range: 4.22 - 5.81 MIL/uL 3.28 (L)  Hemoglobin Latest Ref Range: 13.0 - 17.0 g/dL 7.8 (L)  HCT Latest Ref Range: 39.0 - 52.0 % 24.6 (L)  MCV Latest Ref Range: 78.0 - 100.0 fL 75.0 (L)  MCH Latest Ref Range: 26.0 - 34.0 pg 23.8 (L)  MCHC Latest Ref Range: 30.0 - 36.0 g/dL 91.4  RDW Latest Ref Range: 11.5 - 15.5 % 20.2 (H)  Platelets Latest Ref Range: 150 - 400 K/uL 223  Neutrophils Latest Units: % 73  Lymphocytes Latest Units: % 15  Monocytes Relative Latest Units: % 10  Eosinophil Latest Units: % 1  Basophil Latest Units: % 0  NEUT# Latest Ref Range: 1.7 - 7.7 K/uL 8.6 (H)  Lymphocyte # Latest Ref Range: 0.7 - 4.0 K/uL 1.8  Monocyte # Latest Ref Range: 0.1 - 1.0 K/uL 1.2 (H)  Eosinophils Absolute Latest Ref Range: 0.0 - 0.7 K/uL 0.2  Basophils Absolute Latest Ref Range: 0.0 - 0.1 K/uL 0.0  Immature Granulocytes Latest Units: % 1  Abs Immature Granulocytes Latest Ref Range: 0.0 - 0.1 K/uL 0.1    Blood cultures show no growth to date at the time of this dictation  Treatments: IV hydration, antibiotics: Ancef and gentamicin (perioperative antibiotics for open fracture), vancomycin, Zosyn and doxycycline at discharge, analgesia: Tylenol, oxycodone, morphine, anticoagulation: LMW heparin,  Plavix and aspirin restarted at discharge, therapies: PT, OT, RN and SW and surgery: As above  Discharge Exam:                    Orthopedic Trauma Service Progress Note    Patient ID: Charles Patrick MRN: [id number] DOB/AGE: 1924/05/21 84 y.o.   Subjective:   Doing great No complaints Very  appreciative    Still denies phantom pain    Denied for CIR   Ready to go to SNF    Review of Systems  Constitutional: Negative for chills and fever.  Respiratory: Negative for shortness of breath and wheezing.   Cardiovascular: Negative for chest pain and palpitations.  Gastrointestinal: Negative for nausea and vomiting.  Neurological: Negative for tingling.      Objective:    VITALS:         Vitals:    08/23/17 0537 08/23/17 1100 08/23/17 1125 08/23/17 1415  BP: 99/70 101/64 96/74 93/67   Pulse: 64 71 69 70  Resp: 20 20 18 20   Temp: 98.2 F (36.8 C) 98.4 F (36.9 C) 98.8 F (37.1 C) 98.4 F (36.9 C)  TempSrc: Oral Oral Oral Oral  SpO2: 100% 100% 96% 97%  Weight:          Height:              Estimated body mass index is 21.83 kg/m as calculated from the following:   Height as of this encounter: 6\' 2"  (1.88 m).   Weight as of this encounter: 77.1 kg (170 lb).     Intake/Output      06/27 0701 - 06/28 0700 06/28 0701 - 06/29 0700   P.O. 720    I.V. (mL/kg)  100 (1.3)   Blood  315   IV Piggyback     Total Intake(mL/kg) 720 (9.3) 415 (5.4)   Urine (mL/kg/hr) 2700 (1.5) 650 (1)   Drains     Total Output 2700 650   Net -1980 -235           LABS   Lab Results Last 24 Hours        Results for orders placed or performed during the hospital encounter of 08/14/17 (from the past 24 hour(s))  CBC with Differential/Platelet     Status: Abnormal    Collection Time: 08/23/17  5:24 AM  Result Value Ref Range    WBC 11.9 (H) 4.0 - 10.5 K/uL    RBC 3.28 (L) 4.22 - 5.81 MIL/uL    Hemoglobin 7.8 (L) 13.0 - 17.0 g/dL    HCT 40.9 (L) 81.1 - 52.0 %    MCV 75.0 (L) 78.0 - 100.0 fL    MCH 23.8 (L) 26.0 - 34.0 pg    MCHC 31.7 30.0 -  36.0 g/dL    RDW 16.120.2 (H) 09.611.5 - 15.5 %    Platelets 223 150 - 400 K/uL    Neutrophils Relative % 73 %    Neutro Abs 8.6 (H) 1.7 - 7.7 K/uL    Lymphocytes Relative 15 %    Lymphs Abs 1.8 0.7 - 4.0 K/uL    Monocytes Relative 10 %     Monocytes Absolute 1.2 (H) 0.1 - 1.0 K/uL    Eosinophils Relative 1 %    Eosinophils Absolute 0.2 0.0 - 0.7 K/uL    Basophils Relative 0 %    Basophils Absolute 0.0 0.0 - 0.1 K/uL    Immature Granulocytes 1 %    Abs Immature Granulocytes 0.1 0.0 - 0.1 K/uL  Basic metabolic panel     Status: Abnormal    Collection Time: 08/23/17  5:24 AM  Result Value Ref Range    Sodium 135 135 - 145 mmol/L    Potassium 3.7 3.5 - 5.1 mmol/L    Chloride 98 98 - 111 mmol/L    CO2 28 22 - 32 mmol/L    Glucose, Bld 100 (H) 70 - 99 mg/dL    BUN 9 8 - 23 mg/dL    Creatinine, Ser 0.451.04 0.61 - 1.24 mg/dL    Calcium 8.2 (L) 8.9 - 10.3 mg/dL    GFR calc non Af Amer >60 >60 mL/min    GFR calc Af Amer >60 >60 mL/min    Anion gap 9 5 - 15  Prepare RBC     Status: None    Collection Time: 08/23/17  8:49 AM  Result Value Ref Range    Order Confirmation          ORDER PROCESSED BY BLOOD BANK Performed at [facility], [address] [place], [place] [address]    Type and screen [facility]     Status: None (Preliminary result)    Collection Time: 08/23/17  8:50 AM  Result Value Ref Range    ABO/RH(D) O POS      Antibody Screen NEG      Sample Expiration 08/26/2017      Unit Number [id number]      Blood Component Type RED CELLS,LR      Unit division 00      Status of Unit ISSUED      Transfusion Status OK TO TRANSFUSE      Crossmatch Result          Compatible Performed at [facility], [address] [place], [place] [address]            PHYSICAL EXAM:    Gen: awake and alert, sitting up in bed, NAD, appears well Lungs: breathing unlabored  Cardiac: RRR Abd: + BS, NTND Ext:       Right Lower Extremity              shrinker fitting well on stump              Ext warm              Great hip flexion              Flexes knee well             Ext warm              Swelling controlled    Assessment/Plan: 4 Days Post-Op    Principal Problem:   Open right  ankle fracture Active Problems:   HTN (hypertension)   HLD (hyperlipidemia)   Hypothyroidism   S/P TAVR (transcatheter aortic valve replacement)   Below knee amputation status, right (HCC)   Fever   Fracture   Post-operative pain   Acute blood loss anemia   Anemia of chronic disease   SIRS (systemic inflammatory response syndrome) (HCC)                Anti-infectives (From admission, onward)    Start     Dose/Rate Route Frequency Ordered Stop    08/22/17 0300   vancomycin (VANCOCIN) IVPB 750 mg/150 ml premix     750 mg 150 mL/hr over 60 Minutes Intravenous Every 12 hours 08/21/17 1419      08/21/17 1430   piperacillin-tazobactam (ZOSYN) IVPB 3.375 g     3.375 g 12.5 mL/hr over 240 Minutes Intravenous Every 8 hours 08/21/17 1405      08/21/17 1430   vancomycin (VANCOCIN) 1,500 mg in sodium chloride 0.9 % 500 mL IVPB     1,500 mg 250 mL/hr over 120 Minutes Intravenous  Once 08/21/17 1419 08/21/17 1758    08/19/17 2200   ceFAZolin (ANCEF) IVPB 1 g/50 mL premix     1 g 100 mL/hr over 30 Minutes Intravenous Every 8 hours 08/19/17 1713 08/20/17 1539    08/16/17 1630   ceFAZolin (ANCEF) IVPB 1 g/50 mL premix  Status:  Discontinued     1 g 100 mL/hr over 30 Minutes Intravenous Every 8 hours 08/16/17 1110 08/19/17 1714    08/16/17 1600   gentamicin (GARAMYCIN) 520 mg in dextrose 5 % 100 mL IVPB  Status:  Discontinued     520 mg 113 mL/hr over 60 Minutes Intravenous Every 36 hours 08/15/17 1623 08/19/17 1025    08/15/17 2200   ceFAZolin (ANCEF) IVPB 1 g/50 mL premix  Status:  Discontinued     1 g 100 mL/hr over 30 Minutes Intravenous Every 8 hours 08/15/17 1418 08/16/17 1110    08/15/17 0400   ceFAZolin (ANCEF) IVPB 1 g/50 mL premix  Status:  Discontinued     1 g 100 mL/hr over 30 Minutes Intravenous Every 6 hours 08/15/17 0226 08/15/17 1418    08/15/17 0245   gentamicin (GARAMYCIN) 520 mg in dextrose 5 % 100 mL IVPB     520 mg 113 mL/hr over 60 Minutes Intravenous  Once 08/15/17  0231 08/15/17 0356    08/14/17 1845   ceFAZolin (ANCEF) IVPB 1 g/50 mL premix     1 g 100 mL/hr over 30 Minutes Intravenous  Once 08/14/17 1840 08/14/17 2034     .   POD/HD#: 58   84 year old active male with open right distal tibia and fibula fracture with tendon and nerve disruption of the posterior compartments s/p irrigation debridement and external fixation   -Complex open right distal tibia fracture with severe soft tissue and nerve injury s/p external fixation and irrigation debridement, Mangled R leg s/p R BKA                dressing changes every other day                          Mepitel or adaptic, 4x4's, abd, stockinette and stump shrinker             Ice and elevate             ROM R knee as tolerated  Ok to be out of knee immobilizer during the day                          Place pillow under stump not the knee                         Knee immobilizer on at night              Continue PT/OT               SNF today              Follow up with ortho in 5-7 days      - Pain management:             Continue with current regimen     - ABL anemia/Hemodynamics             mild drift downwards             Vitals stable             Pt asymptomatic with therapies              Pt did get one additional unit of PRBCs today      - Medical issues              Per medical service                         Plavix and aspirin will be restarted at dc                ? Early L lung PNA vs LLL ATX                         Per medicine                         abx started- medicine recommends doxy at dc for another 3 days                          Blood cx pending- No growth to date                           Pt continues to be afebrile                          WBC count trending downward                          Pt w/o any other complaints or symptoms      - DVT/PE prophylaxis:             SCDs              lovenox to x 7 more days    - ID:               vanc and zosyn for possible L lung PNA                           Doxycycline at dc x 3 more days                 -  FEN/GI prophylaxis/Foley/Lines:             reg diet    - Dispo:             SNF today             Follow up with ortho in 5-7 days    Disposition: Discharge disposition: 03-Skilled Nursing Facility       Discharge Instructions    Call MD / Call 911   Complete by:  As directed    If you experience chest pain or shortness of breath, CALL 911 and be transported to the hospital emergency room.  If you develope a fever above 101 F, pus (white drainage) or increased drainage or redness at the wound, or calf pain, call your surgeon's office.   Constipation Prevention   Complete by:  As directed    Drink plenty of fluids.  Prune juice may be helpful.  You may use a stool softener, such as Colace (over the counter) 100 mg twice a day.  Use MiraLax (over the counter) for constipation as needed.   Diet general   Complete by:  As directed    Discharge instructions   Complete by:  As directed    Orthopaedic Trauma Service Discharge Instructions   General Discharge Instructions  WEIGHT BEARING STATUS:  Nonweightbearing Right stump   RANGE OF MOTION/ACTIVITY: unrestricted range of motion of Right knee and hip.  Out of knee immobilizer during the day. Sleep in knee immobilizer. Make sure to protect skin from metal bars in immobilizer  Do not put pillows under knee at rest, place under stump to encourage full knee extension   Wound Care: every other day dressing changes starting on 08/24/2017.  Adaptic or mepitel, 4x4s, stockinette and stump shrinker. Once there is no drainage can simply use stump shrinker.   DVT/PE prophylaxis: Lovenox x 7 more days, resume plavix and aspirin   Diet: regular diet          Can use over the counter stool softeners and bowel preparations, such as Miralax, to help with bowel movements.  Narcotics can be constipating.  Be sure to drink plenty  of fluids  PAIN MEDICATION USE AND EXPECTATIONS  You have likely been given narcotic medications to help control your pain.  After a traumatic event that results in an fracture (broken bone) with or without surgery, it is ok to use narcotic pain medications to help control one's pain.  We understand that everyone responds to pain differently and each individual patient will be evaluated on a regular basis for the continued need for narcotic medications. Ideally, narcotic medication use should last no more than 6-8 weeks (coinciding with fracture healing).   As a patient it is your responsibility as well to monitor narcotic medication use and report the amount and frequency you use these medications when you come to your office visit.   We would also advise that if you are using narcotic medications, you should take a dose prior to therapy to maximize you participation.  IF YOU ARE ON NARCOTIC MEDICATIONS IT IS NOT PERMISSIBLE TO OPERATE A MOTOR VEHICLE (MOTORCYCLE/CAR/TRUCK/MOPED) OR HEAVY MACHINERY DO NOT MIX NARCOTICS WITH OTHER CNS (CENTRAL NERVOUS SYSTEM) DEPRESSANTS SUCH AS ALCOHOL   STOP SMOKING OR USING NICOTINE PRODUCTS!!!!  As discussed nicotine severely impairs your body's ability to heal surgical and traumatic wounds but also impairs bone healing.  Wounds and bone heal by forming microscopic blood vessels (angiogenesis) and nicotine is a vasoconstrictor (  essentially, shrinks blood vessels).  Therefore, if vasoconstriction occurs to these microscopic blood vessels they essentially disappear and are unable to deliver necessary nutrients to the healing tissue.  This is one modifiable factor that you can do to dramatically increase your chances of healing your injury.    (This means no smoking, no nicotine gum, patches, etc)  DO NOT USE NONSTEROIDAL ANTI-INFLAMMATORY DRUGS (NSAID'S)  Using products such as Advil (ibuprofen), Aleve (naproxen), Motrin (ibuprofen) for additional pain control  during fracture healing can delay and/or prevent the healing response.  If you would like to take over the counter (OTC) medication, Tylenol (acetaminophen) is ok.  However, some narcotic medications that are given for pain control contain acetaminophen as well. Therefore, you should not exceed more than 4000 mg of tylenol in a day if you do not have liver disease.  Also note that there are may OTC medicines, such as cold medicines and allergy medicines that my contain tylenol as well.  If you have any questions about medications and/or interactions please ask your doctor/PA or your pharmacist.      ICE AND ELEVATE INJURED/OPERATIVE EXTREMITY  Using ice and elevating the injured extremity above your heart can help with swelling and pain control.  Icing in a pulsatile fashion, such as 20 minutes on and 20 minutes off, can be followed.    Do not place ice directly on skin. Make sure there is a barrier between to skin and the ice pack.    Using frozen items such as frozen peas works well as the conform nicely to the are that needs to be iced.  USE AN ACE WRAP OR TED HOSE FOR SWELLING CONTROL  In addition to icing and elevation, Ace wraps or TED hose are used to help limit and resolve swelling.  It is recommended to use Ace wraps or TED hose until you are informed to stop.    When using Ace Wraps start the wrapping distally (farthest away from the body) and wrap proximally (closer to the body)   Example: If you had surgery on your leg or thing and you do not have a splint on, start the ace wrap at the toes and work your way up to the thigh        If you had surgery on your upper extremity and do not have a splint on, start the ace wrap at your fingers and work your way up to the upper arm  IF YOU ARE IN A SPLINT OR CAST DO NOT REMOVE IT FOR ANY REASON   If your splint gets wet for any reason please contact the office immediately. You may shower in your splint or cast as long as you keep it dry.  This can  be done by wrapping in a cast cover or garbage back (or similar)  Do Not stick any thing down your splint or cast such as pencils, money, or hangers to try and scratch yourself with.  If you feel itchy take benadryl as prescribed on the bottle for itching  IF YOU ARE IN A CAM BOOT (BLACK BOOT)  You may remove boot periodically. Perform daily dressing changes as noted below.  Wash the liner of the boot regularly and wear a sock when wearing the boot. It is recommended that you sleep in the boot until told otherwise  CALL THE OFFICE WITH ANY QUESTIONS OR CONCERNS: [phone]   Do not put a pillow under the knee. Place it under the heel.   Complete  by:  As directed    Place under stump   Increase activity slowly as tolerated   Complete by:  As directed    Non weight bearing   Complete by:  As directed    Laterality:  right   Extremity:  Lower     Allergies as of 08/23/2017   No Known Allergies     Medication List    TAKE these medications   acetaminophen 500 MG tablet Commonly known as:  TYLENOL Take 2 tablets (1,000 mg total) by mouth every 8 (eight) hours.   amoxicillin 500 MG tablet Commonly known as:  AMOXIL Take 4 tablets by mouth one hour prior to dental appointments and stomach or bladder procedures   ascorbic acid 500 MG tablet Commonly known as:  VITAMIN C Take 1 tablet (500 mg total) by mouth daily.   aspirin EC 81 MG tablet Take 81 mg by mouth daily.   clopidogrel 75 MG tablet Commonly known as:  PLAVIX Take 1 tablet (75 mg total) by mouth daily with breakfast.   docusate sodium 100 MG capsule Commonly known as:  COLACE Take 1 capsule (100 mg total) by mouth 2 (two) times daily.   doxycycline 100 MG tablet Commonly known as:  VIBRA-TABS Take 1 tablet (100 mg total) by mouth every 12 (twelve) hours for 3 days.   enoxaparin 40 MG/0.4ML injection Commonly known as:  LOVENOX Inject 0.4 mLs (40 mg total) into the skin daily for 7 days. Start taking on:   08/24/2017   gabapentin 300 MG capsule Commonly known as:  NEURONTIN Take 1 capsule (300 mg total) by mouth 2 (two) times daily.   levothyroxine 50 MCG tablet Commonly known as:  SYNTHROID, LEVOTHROID Take 50 mcg by mouth daily before breakfast.   multivitamin capsule Take 1 capsule by mouth daily.   oxyCODONE 5 MG immediate release tablet Commonly known as:  Oxy IR/ROXICODONE Take 1-2 tablets (5-10 mg total) by mouth every 6 (six) hours as needed for moderate pain or severe pain (pain score 4-6).   simvastatin 40 MG tablet Commonly known as:  ZOCOR Take 40 mg by mouth daily at 6 PM.   tamsulosin 0.4 MG Caps capsule Commonly known as:  FLOMAX Take 0.4 mg by mouth daily after supper.            Discharge Care Instructions  (From admission, onward)        Start     Ordered   08/23/17 0000  Non weight bearing    Question Answer Comment  Laterality right   Extremity Lower      08/23/17 1527     Follow-up Information    Myrene Galas, MD. Schedule an appointment as soon as possible for a visit on 08/28/2017.   Specialty:  Orthopedic Surgery Contact information: [address] [place] [address] [phone]           Discharge Instructions and Plan:   84 year old active male with open right distal tibia and fibula fracture with tendon and nerve disruption of the posterior compartments s/p irrigation debridement and external fixation   -Complex open right distal tibia fracture with severe soft tissue and nerve injury s/p external fixation and irrigation debridement, Mangled R leg s/p R BKA                dressing changes every other day  Mepitel or adaptic, 4x4's, abd, stockinette and stump shrinker             Ice and elevate             ROM R knee as tolerated                         Ok to be out of knee immobilizer during the day                          Place pillow under stump not the knee                          Knee immobilizer on at night              Continue PT/OT               SNF today              Follow up with ortho in 5-7 days      - Pain management:             Continue with current regimen     - ABL anemia/Hemodynamics             mild drift downwards             Vitals stable             Pt asymptomatic with therapies              Pt did get one additional unit of PRBCs today      - Medical issues              Per medical service                         Plavix and aspirin will be restarted at dc                ? Early L lung PNA vs LLL ATX                         Per medicine                         abx started- medicine recommends doxy at dc for another 3 days                          Blood cx pending- No growth to date                           Pt continues to be afebrile                          WBC count trending downward                          Pt w/o any other complaints or symptoms    Continue with aggressive incentive spirometry     - DVT/PE prophylaxis:             SCDs              lovenox to x 7 more days    -  ID:              vanc and zosyn for possible L lung PNA                           Doxycycline at dc x 3 more days                 - FEN/GI prophylaxis/Foley/Lines:             reg diet    - Dispo:             SNF today             Follow up with ortho in 5-7 days    Signed:  Mearl Latin, PA-C [facility] [phone] (P) 08/23/2017, 3:35 PM

## 2017-08-23 NOTE — Progress Notes (Signed)
Pharmacy Antibiotic Note  Charles KaufmannDavid B Patrick is a 84 y.o. male admitted on 08/14/2017 with injury to ankle while moving grass.  He is s/p I&D, ORIF and BKA.  Now with early PNA and Pharmacy has been consulted for vancomycin dosing.  Patient is also on Zosyn.  D#3 for early PNA. Has cement spacer, and delayed bone grafting.  S/p OR on 6/21 and 6/22 for I&D, closure of wound, ORIF, wound VAC. BKA 6/24. Now afebrile, WBC back down to 11.9  Plan: Continue vancomycin 750mg  IV Q12H Continue Zosyn EID 3.375gm IV Q8H  Monitor clinical picture, renal function, VT prn  Plan for 5 day total course. Consider switch to PO doxy?   Height: 6\' 2"  (188 cm) Weight: 170 lb (77.1 kg) IBW/kg (Calculated) : 82.2  Temp (24hrs), Avg:98.6 F (37 C), Min:98.2 F (36.8 C), Max:99.2 F (37.3 C)  Recent Labs  Lab 08/17/17 0358 08/19/17 0423 08/19/17 0628 08/20/17 0655 08/21/17 0416 08/22/17 0351 08/23/17 0524  WBC 12.0* 8.7 8.7 9.8 15.1* 15.5* 11.9*  CREATININE 0.97 0.84  --  1.04  --  1.03 1.04    Estimated Creatinine Clearance: 51.5 mL/min (by C-G formula based on SCr of 1.04 mg/dL).    No Known Allergies   Charles BiNathan Chantia Patrick, PharmD, BCPS Clinical Pharmacist Phone number [phone] 08/23/2017 8:46 AM

## 2017-08-23 NOTE — Progress Notes (Signed)
Physical Therapy Treatment Patient Details Name: Charles Patrick MRN: [id number] DOB: 1925/02/05 Today's Date: 08/23/2017    History of Present Illness Pt is a 84 y/o male s/p R transtibial amputation. 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    PT Comments    Focus of session was on amputee therex (see below for details). Pt tolerated very well. Pt would continue to benefit from skilled physical therapy services at this time while admitted and after d/c to address the below listed limitations in order to improve overall safety and independence with functional mobility.    Follow Up Recommendations  CIR     Equipment Recommendations  None recommended by PT    Recommendations for Other Services       Precautions / Restrictions Precautions Precautions: Fall Restrictions Weight Bearing Restrictions: Yes RLE Weight Bearing: Non weight bearing    Mobility  Bed Mobility               General bed mobility comments: focus of session was on amputee therex  Transfers                    Ambulation/Gait                 Stairs             Wheelchair Mobility    Modified Rankin (Stroke Patients Only)       Balance                                            Cognition Arousal/Alertness: Awake/alert Behavior During Therapy: WFL for tasks assessed/performed Overall Cognitive Status: Within Functional Limits for tasks assessed                                        Exercises Amputee Exercises Quad Sets: AROM;Strengthening;Right;10 reps;Supine Gluteal Sets: AROM;Strengthening;Both;10 reps;Supine Hip ABduction/ADduction: AROM;Strengthening;Right;10 reps;Supine Knee Flexion: AROM;Strengthening;Right;10 reps;Supine Knee Extension: AROM;Strengthening;Right;10 reps;Supine Straight Leg Raises: AROM;Strengthening;Right;10  reps;Supine    General Comments        Pertinent Vitals/Pain Pain Assessment: Faces Faces Pain Scale: Hurts a little bit Pain Location: R residual limb Pain Descriptors / Indicators: Sore Pain Intervention(s): Monitored during session;Repositioned;Patient requesting pain meds-RN notified    Home Living                      Prior Function            PT Goals (current goals can now be found in the care plan section) Acute Rehab PT Goals PT Goal Formulation: With patient/family Time For Goal Achievement: 09/04/17 Potential to Achieve Goals: Good Progress towards PT goals: Progressing toward goals    Frequency    Min 5X/week      PT Plan Current plan remains appropriate    Co-evaluation              AM-PAC PT "6 Clicks" Daily Activity  Outcome Measure  Difficulty turning over in bed (including adjusting bedclothes, sheets and blankets)?: None Difficulty moving from lying on back to sitting on the side of the bed? : None Difficulty sitting down on and standing up from a chair with arms (e.g., wheelchair, bedside commode, etc,.)?:  Unable Help needed moving to and from a bed to chair (including a wheelchair)?: A Little Help needed walking in hospital room?: A Little Help needed climbing 3-5 steps with a railing? : Total 6 Click Score: 16    End of Session   Activity Tolerance: Patient tolerated treatment well Patient left: in bed;with call bell/phone within reach Nurse Communication: Mobility status PT Visit Diagnosis: Other abnormalities of gait and mobility (R26.89)     Time: [id number] PT Time Calculation (min) (ACUTE ONLY): 14 min  Charges:  $Therapeutic Exercise: 8-22 mins                    G Codes:       Charles Patrick, PT, [place]DPT [phone]    Charles BevelsJennifer M [name] 08/23/2017, 5:00 PM

## 2017-08-23 NOTE — Discharge Instructions (Signed)
[facility] Discharge Instructions   General Discharge Instructions  WEIGHT BEARING STATUS:  Nonweightbearing Right stump   RANGE OF MOTION/ACTIVITY: unrestricted range of motion of Right knee and hip.  Out of knee immobilizer during the day. Sleep in knee immobilizer. Make sure to protect skin from metal bars in immobilizer  Do not put pillows under knee at rest, place under stump to encourage full knee extension   Wound Care: every other day dressing changes starting on 08/24/2017.  Adaptic or mepitel, 4x4s, stockinette and stump shrinker. Once there is no drainage can simply use stump shrinker.   DVT/PE prophylaxis: Lovenox x 7 more days, resume plavix and aspirin   Diet: regular diet          Can use over the counter stool softeners and bowel preparations, such as Miralax, to help with bowel movements.  Narcotics can be constipating.  Be sure to drink plenty of fluids  PAIN MEDICATION USE AND EXPECTATIONS  You have likely been given narcotic medications to help control your pain.  After a traumatic event that results in an fracture (broken bone) with or without surgery, it is ok to use narcotic pain medications to help control one's pain.  We understand that everyone responds to pain differently and each individual patient will be evaluated on a regular basis for the continued need for narcotic medications. Ideally, narcotic medication use should last no more than 6-8 weeks (coinciding with fracture healing).   As a patient it is your responsibility as well to monitor narcotic medication use and report the amount and frequency you use these medications when you come to your office visit.   We would also advise that if you are using narcotic medications, you should take a dose prior to therapy to maximize you participation.  IF YOU ARE ON NARCOTIC MEDICATIONS IT IS NOT PERMISSIBLE TO OPERATE A MOTOR VEHICLE (MOTORCYCLE/CAR/TRUCK/MOPED) OR HEAVY MACHINERY DO NOT MIX NARCOTICS  WITH OTHER CNS (CENTRAL NERVOUS SYSTEM) DEPRESSANTS SUCH AS ALCOHOL   STOP SMOKING OR USING NICOTINE PRODUCTS!!!!  As discussed nicotine severely impairs your body's ability to heal surgical and traumatic wounds but also impairs bone healing.  Wounds and bone heal by forming microscopic blood vessels (angiogenesis) and nicotine is a vasoconstrictor (essentially, shrinks blood vessels).  Therefore, if vasoconstriction occurs to these microscopic blood vessels they essentially disappear and are unable to deliver necessary nutrients to the healing tissue.  This is one modifiable factor that you can do to dramatically increase your chances of healing your injury.    (This means no smoking, no nicotine gum, patches, etc)  DO NOT USE NONSTEROIDAL ANTI-INFLAMMATORY DRUGS (NSAID'S)  Using products such as Advil (ibuprofen), Aleve (naproxen), Motrin (ibuprofen) for additional pain control during fracture healing can delay and/or prevent the healing response.  If you would like to take over the counter (OTC) medication, Tylenol (acetaminophen) is ok.  However, some narcotic medications that are given for pain control contain acetaminophen as well. Therefore, you should not exceed more than 4000 mg of tylenol in a day if you do not have liver disease.  Also note that there are may OTC medicines, such as cold medicines and allergy medicines that my contain tylenol as well.  If you have any questions about medications and/or interactions please ask your doctor/PA or your pharmacist.      ICE AND ELEVATE INJURED/OPERATIVE EXTREMITY  Using ice and elevating the injured extremity above your heart can help with swelling and pain control.  Icing  in a pulsatile fashion, such as 20 minutes on and 20 minutes off, can be followed.    Do not place ice directly on skin. Make sure there is a barrier between to skin and the ice pack.    Using frozen items such as frozen peas works well as the conform nicely to the are that  needs to be iced.  USE AN ACE WRAP OR TED HOSE FOR SWELLING CONTROL  In addition to icing and elevation, Ace wraps or TED hose are used to help limit and resolve swelling.  It is recommended to use Ace wraps or TED hose until you are informed to stop.    When using Ace Wraps start the wrapping distally (farthest away from the body) and wrap proximally (closer to the body)   Example: If you had surgery on your leg or thing and you do not have a splint on, start the ace wrap at the toes and work your way up to the thigh        If you had surgery on your upper extremity and do not have a splint on, start the ace wrap at your fingers and work your way up to the upper arm  IF YOU ARE IN A SPLINT OR CAST DO NOT REMOVE IT FOR ANY REASON   If your splint gets wet for any reason please contact the office immediately. You may shower in your splint or cast as long as you keep it dry.  This can be done by wrapping in a cast cover or garbage back (or similar)  Do Not stick any thing down your splint or cast such as pencils, money, or hangers to try and scratch yourself with.  If you feel itchy take benadryl as prescribed on the bottle for itching  IF YOU ARE IN A CAM BOOT (BLACK BOOT)  You may remove boot periodically. Perform daily dressing changes as noted below.  Wash the liner of the boot regularly and wear a sock when wearing the boot. It is recommended that you sleep in the boot until told otherwise  CALL THE OFFICE WITH ANY QUESTIONS OR CONCERNS: [phone]

## 2017-08-23 NOTE — NC FL2 (Signed)
[place] MEDICAID FL2 LEVEL OF CARE SCREENING TOOL     IDENTIFICATION  Patient Name: Charles KaufmannDavid B Yochim Birthdate: 1924-12-06 Sex: male Admission Date (Current Location): 08/14/2017  [facility] [place]   Facility and Address:  [facility], [address], [place], [place] [address]      Provider Number: [id number]  Attending Physician Name and Address:  Myrene GalasHandy, [name], MD  Relative Name and Phone Number:  Bascom LevelsSpouse, Maggie Norland, [phone]    Current Level of Care: Hospital Recommended Level of Care: Skilled Nursing Facility Prior Approval Number:    Date Approved/Denied:   PASRR Number: [id number]  Discharge Plan: SNF    Current Diagnoses: Patient Active Problem List   Diagnosis Date Noted  . Below knee amputation status, right (HCC)   . Fever   . Fracture   . Post-operative pain   . Acute blood loss anemia   . Anemia of chronic disease   . SIRS (systemic inflammatory response syndrome) (HCC)   . Open right ankle fracture 08/15/2017  . Anemia   . 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 HTN (hypertension) 10/08/2016  . HLD (hyperlipidemia) 10/08/2016  . Hypothyroidism 10/08/2016  . Pulmonary hypertension (HCC) 10/08/2016  . Tricuspid regurgitation 10/08/2016  . Severe aortic stenosis     Orientation RESPIRATION BLADDER Height & Weight     Self, Time, Situation  Normal Continent Weight: 170 lb (77.1 kg) Height:  6\' 2"  (188 cm)  BEHAVIORAL SYMPTOMS/MOOD NEUROLOGICAL BOWEL NUTRITION STATUS      Continent Diet(See DC summary)  AMBULATORY STATUS COMMUNICATION OF NEEDS Skin     Verbally Surgical wounds, Wound Vac(Right BKA)                       Personal Care Assistance Level of Assistance  Bathing, Feeding, Dressing Bathing Assistance: Limited assistance Feeding assistance: Independent Dressing Assistance: Maximum assistance     Functional Limitations Info  Sight, Hearing, Speech  Sight Info: Adequate Hearing Info: Adequate Speech Info: Adequate    SPECIAL CARE FACTORS FREQUENCY  PT (By licensed PT), OT (By licensed OT)     PT Frequency: 5x OT Frequency: 2x week            Contractures Contractures Info: Not present    Additional Factors Info  Code Status, Allergies Code Status Info: Full Allergies Info: No Known Allergiels           Current Medications (08/23/2017):  This is the current hospital active medication list Current Facility-Administered Medications  Medication Dose Route Frequency Provider Last Rate Last Dose  . 0.9 %  sodium chloride infusion   Intravenous PRN Montez MoritaPaul, Keith, PA-C   Stopped at 08/21/17 1558  . acetaminophen (TYLENOL) tablet 1,000 mg  1,000 mg Oral Q8H Montez Moritaaul, Keith, PA-C   1,000 mg at 08/23/17 [id number]  . bisacodyl (DULCOLAX) suppository 10 mg  10 mg Rectal UD Montez Moritaaul, Keith, PA-C   10 mg at 08/18/17 1734  . diphenhydrAMINE (BENADRYL) 12.5 MG/5ML elixir 12.5-25 mg  12.5-25 mg Oral Q4H PRN Montez MoritaPaul, Keith, PA-C      . docusate sodium (COLACE) capsule 100 mg  100 mg Oral BID Montez MoritaPaul, Keith, PA-C   100 mg at 08/23/17 0836  . enoxaparin (LOVENOX) injection 40 mg  40 mg Subcutaneous Q24H Myrene GalasHandy, [name], MD   40 mg at 08/23/17 0836  . gabapentin (NEURONTIN) capsule 300 mg  300 mg Oral BID Montez MoritaPaul, Keith, PA-C   300  mg at 08/23/17 0836  . HYDROmorphone (DILAUDID) injection 0.5-1 mg  0.5-1 mg Intravenous Q4H PRN Montez Morita, PA-C   1 mg at 08/16/17 0100  . lactated ringers infusion   Intravenous Continuous Montez Morita, PA-C 10 mL/hr at 08/19/17 1227    . levothyroxine (SYNTHROID, LEVOTHROID) tablet 50 mcg  50 mcg Oral QAC breakfast Montez Morita, PA-C   50 mcg at 08/23/17 0836  . methocarbamol (ROBAXIN) tablet 500 mg  500 mg Oral Q6H PRN Montez Morita, PA-C   500 mg at 08/22/17 0555   Or  . methocarbamol (ROBAXIN) 500 mg in dextrose 5 % 50 mL IVPB  500 mg Intravenous Q6H PRN Montez Morita, PA-C      . ondansetron [facility]) tablet 4 mg  4 mg Oral Q6H PRN  Montez Morita, PA-C       Or  . ondansetron [facility]) injection 4 mg  4 mg Intravenous Q6H PRN Montez Morita, PA-C      . oxyCODONE (Oxy IR/ROXICODONE) immediate release tablet 10-15 mg  10-15 mg Oral Q4H PRN Montez Morita, PA-C   15 mg at 08/21/17 2129  . oxyCODONE (Oxy IR/ROXICODONE) immediate release tablet 5-10 mg  5-10 mg Oral Q4H PRN Montez Morita, PA-C   10 mg at 08/22/17 2141  . piperacillin-tazobactam (ZOSYN) IVPB 3.375 g  3.375 g Intravenous Q8H Noralee Stain, DO 12.5 mL/hr at 08/23/17 0544 3.375 g at 08/23/17 0544  . senna-docusate (Senokot-S) tablet 2 tablet  2 tablet Oral BID Montez Morita, PA-C   2 tablet at 08/23/17 1478  . simvastatin (ZOCOR) tablet 40 mg  40 mg Oral q1800 Montez Morita, PA-C   40 mg at 08/22/17 1815  . tamsulosin (FLOMAX) capsule 0.4 mg  0.4 mg Oral QPC supper Montez Morita, PA-C   0.4 mg at 08/22/17 1815  . vancomycin (VANCOCIN) IVPB 750 mg/150 ml premix  750 mg Intravenous Q12H Gerilyn Nestle, RPH 150 mL/hr at 08/23/17 0331 750 mg at 08/23/17 2956     Discharge Medications: Please see discharge summary for a list of discharge medications.  Relevant Imaging Results:  Relevant Lab Results:   Additional Information SS#: [id number]  Edwards Mckelvie A Emilyrose Darrah, LCSW

## 2017-08-23 NOTE — Clinical Social Work Note (Signed)
Clinical Social Work Assessment  Patient Details  Name: Charles Patrick MRN: [id number] Date of Birth: Aug 31, 1924  Date of referral:  08/23/17               Reason for consult:  Facility Placement                Permission sought to share Charles with:  Facility Medical sales representativeContact Representative, Family Supports Permission granted to share Charles::  Yes, Verbal Permission Granted  Name::     Charles Patrick  Agency::  [facility]  Relationship::  Son  Contact Charles:  [phone]  Housing/Transportation Living arrangements for the past 2 months:  Single Family Home Source of Charles:  Patient, Adult Children Patient Interpreter Needed:  None Criminal Activity/Legal Involvement Pertinent to Current Situation/Hospitalization:  No - Comment as needed Significant Relationships:  Adult Children, Spouse Lives with:  Spouse Do you feel safe going back to the place where you live?  Yes Need for family participation in patient care:  No (Coment)  Care giving concerns:  Pt is alert and oriented. Pt was functioning independently at home prior to admission. Pt lives with spouse--no additional caregivers noted at this time.   Social Worker assessment / plan:  CSW spoke with pt at bedside. Pt was in bed. Pt was in good spirits. Pt is understanding of the SNF recommendation. Pt is understanding that insurance denied CIR admission. Pt mentioned Charles Patrick. Pt gave CSW verbal permission to contact his son and /or wife to determine placement.  CSW reached out to pt's son. [facility] will [facility] pt at this time. Insurance has been initiated.  Employment status:  Retired Database administratornsurance Charles:  Managed Medicare PT Recommendations:  Skilled Nursing Facility Charles / Referral to community resources:  Skilled Nursing Facility  Patient/Family's Response to care: Pt verbalized understanding of CSW role and expressed appreciation for support. Pt denies any concern  regarding pt care at this time.    Patient/Family's Understanding of and Emotional Response to Diagnosis, Current Treatment, and Prognosis:  Pt understanding and realistic regarding physical limitations. Pt understands the need for SNF placement at d/c. Pt agreeable to SNF placement at d/c, at this time. Pt's responses emotionally appropriate during conversation with CSW. Pt denies any concern regarding treatment plan at this time. CSW will continue to provide support and facilitate d/c needs.   Emotional Assessment Appearance:  Appears stated age Attitude/Demeanor/Rapport:  (Patient was appropriate) Affect (typically observed):  Accepting, Appropriate, Calm Orientation:  Oriented to Self, Oriented to Place, Oriented to  Time, Oriented to Situation Alcohol / Substance use:  Not Applicable Psych involvement (Current and /or in the community):  No (Comment)  Discharge Needs  Concerns to be addressed:  Care Coordination, Basic Needs Readmission within the last 30 days:  No Current discharge risk:  Dependent with Mobility Barriers to Discharge:  Continued Medical Work up, [place] Authorization   Charles Patrick A Rudie Sermons, LCSW 08/23/2017, 1:36 PM

## 2017-08-23 NOTE — Progress Notes (Signed)
Report given to Diona FantiAiesha, nurse at facilty and [facility] called. No ETA at this time.

## 2017-08-23 NOTE — Clinical Social Work Note (Addendum)
.  Clinical Social Worker facilitated patient discharge including contacting patient family and facility to confirm patient discharge plans.  Clinical information faxed to facility and family agreeable with plan.  CSW arranged [facility] .  RN to call [phone] for report prior to discharge.  RN PLEASE CALL SON WHEN PT IS PICKED UP  Clinical Social Worker will sign off for now as social work intervention is no longer needed. Please consult us again if new need arises.  Tano RoadBridget Emya Patrick, [place]CSWA [phone]

## 2017-08-23 NOTE — Clinical Social Work Placement (Signed)
   CLINICAL SOCIAL WORK PLACEMENT  NOTE  Date:  08/23/2017  Patient Details  Name: Charles KaufmannDavid B Ourada MRN: [id number] Date of Birth: 27-Jan-1925  Clinical Social Work is seeking post-discharge placement for this patient at the Skilled  Nursing Facility level of care (*CSW will initial, date and re-position this form in  chart as items are completed):      Patient/family provided with [facility] Clinical [facility] Work Department's list of facilities offering this level of care within the geographic area requested by the patient (or if unable, by the patient's family).  Yes   Patient/family informed of their freedom to choose among providers that offer the needed level of care, that participate in Medicare, Medicaid or managed care program needed by the patient, have an available bed and are willing to accept the patient.      Patient/family informed of [facility]'s ownership interest in [facility] and [facility], as well as of the fact that they are under no obligation to receive care at these facilities.  PASRR submitted to EDS on       PASRR number received on 08/23/17     Existing PASRR number confirmed on       FL2 transmitted to all facilities in geographic area requested by pt/family on 08/23/17     FL2 transmitted to all facilities within larger geographic area on       Patient informed that his/her managed care company has contracts with or will negotiate with certain facilities, including the following:        Yes   Patient/family informed of bed offers received.  [facility]     Physician recommends and patient chooses bed at      [facility].  Patient to be transferred to facility by [facility]     Patient family notified on 08/23/17 of transfer.  Name of family member notified:  Maggie     PHYSICIAN Please prepare priority discharge summary, including medications, Please prepare prescriptions,  Please sign FL2     Additional Comment:    _______________________________________________ Maree KrabbeBridget A Seirra Kos, LCSW 08/23/2017, 2:32 PM

## 2017-08-24 LAB — TYPE AND SCREEN
ABO/RH(D): O POS
ANTIBODY SCREEN: NEGATIVE
UNIT DIVISION: 0

## 2017-08-24 LAB — BPAM RBC
Blood Product Expiration Date: 201907022359
ISSUE DATE / TIME: 201906281052
Unit Type and Rh: 5100

## 2017-08-26 LAB — CULTURE, BLOOD (ROUTINE X 2)
Culture: NO GROWTH
Culture: NO GROWTH
SPECIAL REQUESTS: ADEQUATE
Special Requests: ADEQUATE

## 2017-10-01 ENCOUNTER — Other Ambulatory Visit: Payer: Self-pay | Admitting: Physician Assistant

## 2017-10-01 DIAGNOSIS — Z952 Presence of prosthetic heart valve: Secondary | ICD-10-CM

## 2018-01-13 ENCOUNTER — Telehealth: Payer: Self-pay | Admitting: Physician Assistant

## 2018-01-13 IMAGING — CT CT HEART MORP W/ CTA COR W/ SCORE W/ CA W/CM &/OR W/O CM
2 of 4 series · 12 of 20 positions shown, 14 images · IV contrast (APPLIED)
Comparison: None.

CLINICAL DATA: Aorticstenosis

EXAM:
Cardiac TAVR CT
TECHNIQUE: The patient was scanned on a Siemens [REDACTED]ice scanner. A 120 kV
retrospective scan was triggered in the ascending thoracic aorta at
140 HU's. Gantry rotation speed was 250 msecs and collimation was .6
mm. Lopressor 5 mg given the 3D data set was reconstructed in 5%
intervals of the R-R cycle. Systolic and diastolic phases were
analyzed on a dedicated work station using MPR, MIP and VRT modes.
The patient received 80 cc of contrast.

[Series 4: best diast 79 % · axial · 0.39mm/px · z∈[+1104,+1305]mm · 6 of 469 slices shown, 8 images]
[im 67/469  vessel]
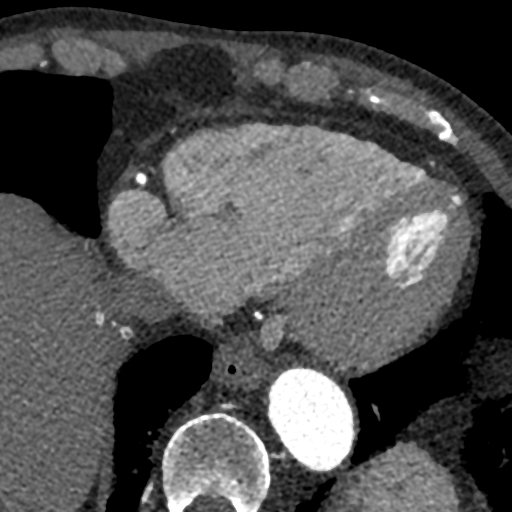
[im 67/469  lung]
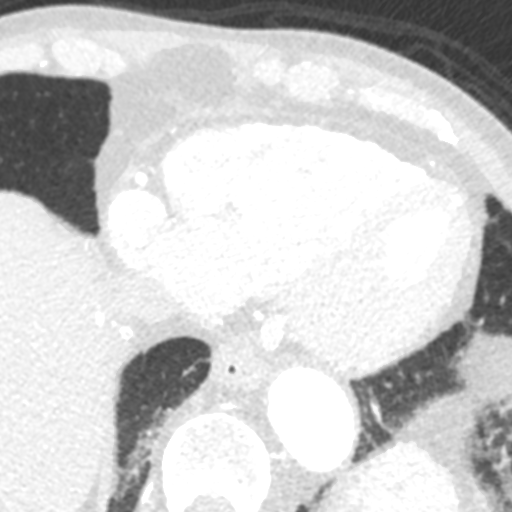
[im 134/469  vessel]
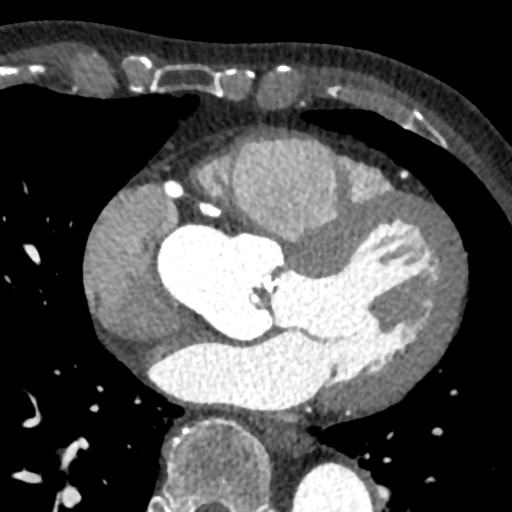
[im 201/469  vessel]
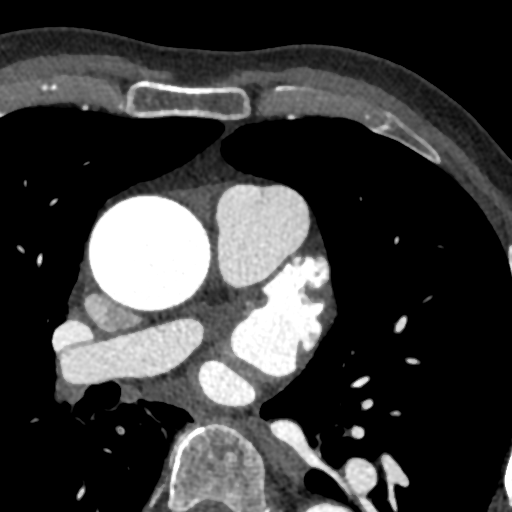
[im 268/469  vessel]
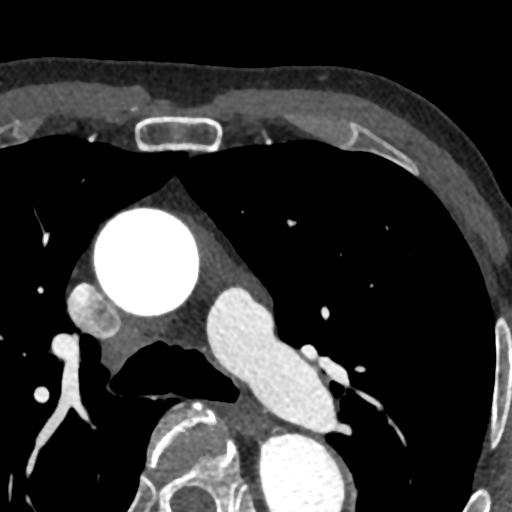
[im 335/469  vessel]
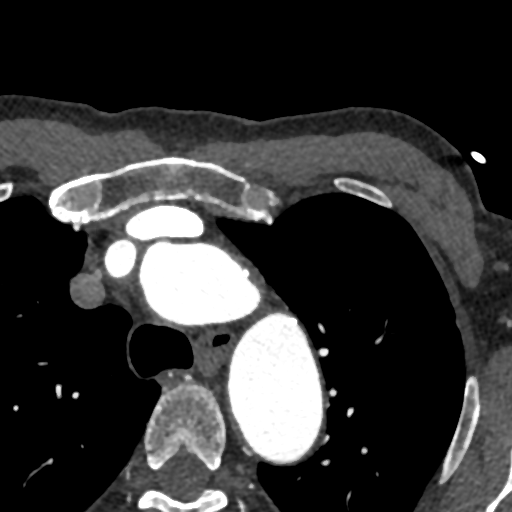
[im 335/469  lung]
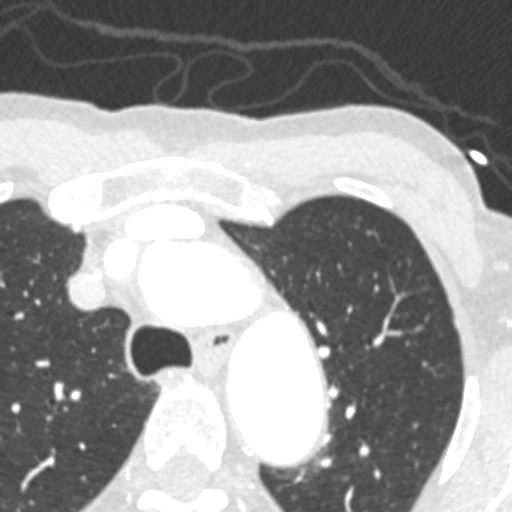
[im 402/469  vessel]
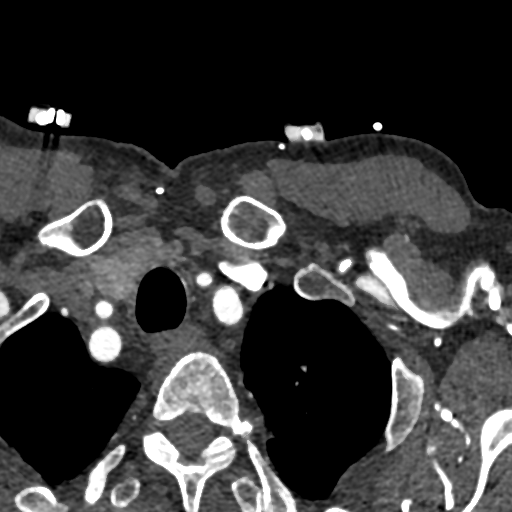

[Series 5: best syst 25 % · axial · 0.39mm/px · z∈[+1104,+1303]mm · 6 of 466 slices shown]
[im 67/466  vessel]
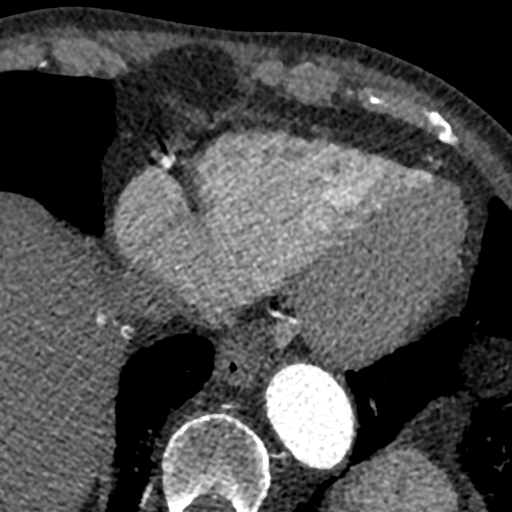
[im 133/466  vessel]
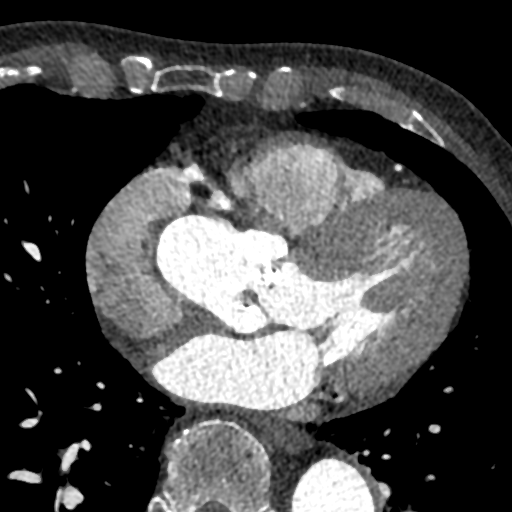
[im 200/466  vessel]
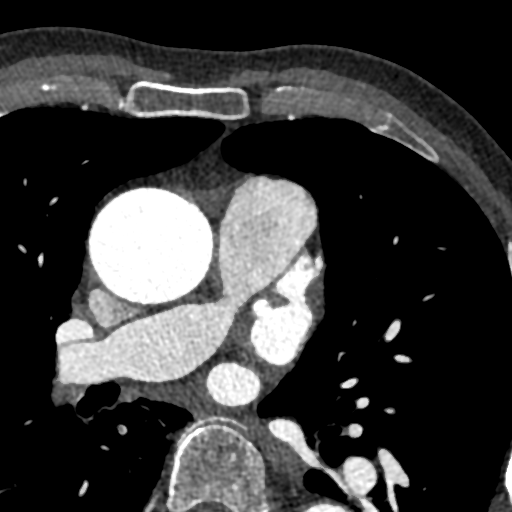
[im 266/466  vessel]
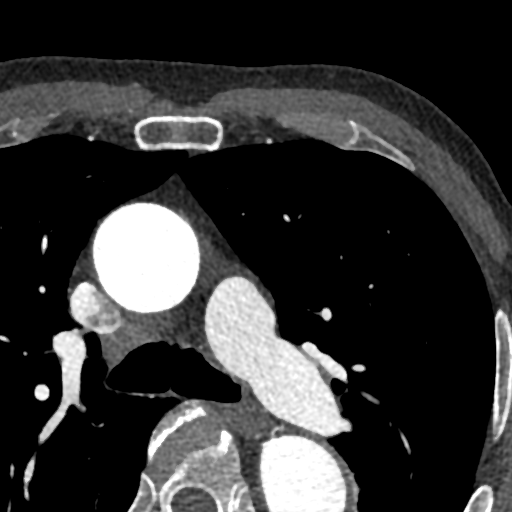
[im 333/466  vessel]
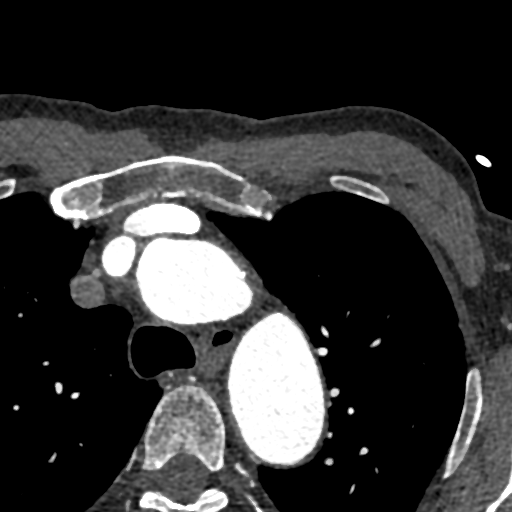
[im 399/466  vessel]
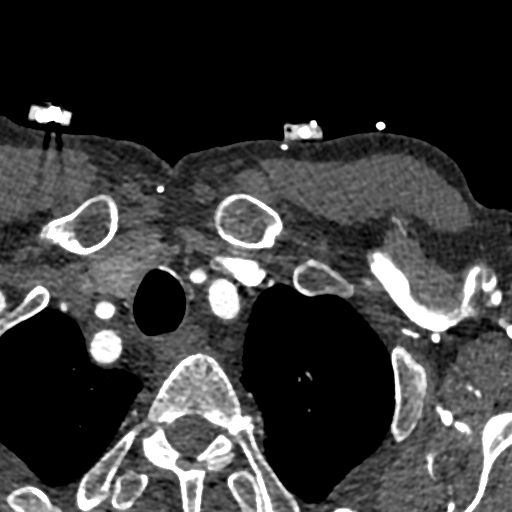

[12 of 20 positions shown; findings below may reference images not displayed]

FINDINGS: Aortic Valve: Tri leaflet severely calcified valve with restricted
leaflet motion

Aorta: Moderate aortic root dilatation. Significant angulation of
the mid aortic arch after the take off of the left subclavian.
Marked tortuosity of the descending thoracic aorta

Sinotubular Junction:  34.6 mm

Ascending Thoracic Aorta:  46 mm

Aortic Arch:  39 mm

Descending Thoracic Aorta:  30 mm

Sinus of Valsalva Measurements:

Non-coronary:  39 mm

Right -coronary:  36.9 mm

Left -coronary:  37.5 mm

Coronary Artery Height above Annulus:

Left Main:  15.2 mm above annulus

Right Coronary:  20.5 mm above annulus

Virtual Basal Annulus Measurements:

Maximum/Minimum Diameter:  28.2 mm x 19.6 mm elliptical

Perimeter:  81.9 mm

Area:  458 mm2

Coronary Arteries:  Sufficient height above annulus for deployment

Optimum Fluoroscopic Angle for Delivery: LAO 2 degrees Caudal 23
degrees
IMPRESSION: 1) Severely calcified tri leaflet aortic valve with annular area 458
mm2 perimeter 81.9 mm. Suitable for a 26 mm Sapien 3 valve Although
diameters smaller and annulus elliptical sinus and perimeter

Measurements suggest may be suitable for a 34 mm Evolut R valve

2) Moderate ascending aortic root dilatation 46 mm with marked
angulation of the mid arch and marked tortuosity of the descending
thoracic aorta which may make delivery challenging

3) Sufficient coronary height for deployment

4) Optimum angle for deployment LAO 2 degrees Caudal 23 degrees

5) No DALAL thrombus

Moises David Apartado

EXAM:
OVER-READ INTERPRETATION  CT CHEST

The following report is an over-read performed by radiologist Dr.
over-read does not include interpretation of cardiac or coronary
anatomy or pathology. The coronary calcium score/coronary CTA
interpretation by the cardiologist is attached.
FINDINGS: Relevant extracardiac findings will be described under separate
dictation for contemporaneously obtained CTA of the chest, abdomen
and pelvis.
IMPRESSION: Please see separate dictation for contemporaneously obtained CTA of
the chest, abdomen and pelvis 10/25/2016 for full description of
relevant extracardiac findings.

## 2018-01-13 NOTE — Telephone Encounter (Signed)
  [facility]   MULTIDISCIPLINARY HEART VALVE TEAM   Patient was due for his 1 year TAVR appointment. He had an injury to his leg in 07/2017 and had to have his leg amputated. He is awaiting his prosthesis. All of his care has been transferred to the [facility] in [place]. He is worried about coming in with all his bills and doesn't want to come into [place].WE have cancelled his appointment for this Wednesday. I have completed a KCCQ with him. He has NYHA class I symptoms and is doing very well. I have asked him to send us a copy of an echo if he gets one done at the [facility] or with [name].   Cline CrockKathryn Sacramento Monds PA-C  [facility]

## 2018-01-16 ENCOUNTER — Ambulatory Visit: Payer: Self-pay | Admitting: Physician Assistant

## 2018-01-16 ENCOUNTER — Other Ambulatory Visit (HOSPITAL_COMMUNITY): Payer: Self-pay

## 2018-01-29 ENCOUNTER — Encounter: Payer: Self-pay | Admitting: Thoracic Surgery (Cardiothoracic Vascular Surgery)

## 2018-04-11 ENCOUNTER — Inpatient Hospital Stay (HOSPITAL_COMMUNITY)
Admission: EM | Admit: 2018-04-11 | Discharge: 2018-04-16 | DRG: 064 | Disposition: A | Discharge status: Rehabilitation Facility | Payer: Medicare Other | Attending: Pulmonary Disease | Admitting: Pulmonary Disease

## 2018-04-11 ENCOUNTER — Emergency Department (HOSPITAL_COMMUNITY): Payer: Medicare Other

## 2018-04-11 ENCOUNTER — Encounter (HOSPITAL_COMMUNITY): Payer: Self-pay | Admitting: Emergency Medicine

## 2018-04-11 DIAGNOSIS — I4891 Unspecified atrial fibrillation: Secondary | ICD-10-CM | POA: Diagnosis present

## 2018-04-11 DIAGNOSIS — G40901 Epilepsy, unspecified, not intractable, with status epilepticus: Secondary | ICD-10-CM | POA: Diagnosis present

## 2018-04-11 DIAGNOSIS — S065X9A Traumatic subdural hemorrhage with loss of consciousness of unspecified duration, initial encounter: Secondary | ICD-10-CM

## 2018-04-11 DIAGNOSIS — R339 Retention of urine, unspecified: Secondary | ICD-10-CM | POA: Diagnosis present

## 2018-04-11 DIAGNOSIS — R29810 Facial weakness: Secondary | ICD-10-CM | POA: Diagnosis present

## 2018-04-11 DIAGNOSIS — N401 Enlarged prostate with lower urinary tract symptoms: Secondary | ICD-10-CM | POA: Diagnosis present

## 2018-04-11 DIAGNOSIS — I77810 Thoracic aortic ectasia: Secondary | ICD-10-CM | POA: Diagnosis present

## 2018-04-11 DIAGNOSIS — I6203 Nontraumatic chronic subdural hemorrhage: Secondary | ICD-10-CM | POA: Diagnosis present

## 2018-04-11 DIAGNOSIS — Z7902 Long term (current) use of antithrombotics/antiplatelets: Secondary | ICD-10-CM

## 2018-04-11 DIAGNOSIS — R5381 Other malaise: Secondary | ICD-10-CM | POA: Diagnosis not present

## 2018-04-11 DIAGNOSIS — G92 Toxic encephalopathy: Secondary | ICD-10-CM | POA: Diagnosis present

## 2018-04-11 DIAGNOSIS — E440 Moderate protein-calorie malnutrition: Secondary | ICD-10-CM | POA: Diagnosis present

## 2018-04-11 DIAGNOSIS — Z298 Encounter for other specified prophylactic measures: Secondary | ICD-10-CM | POA: Diagnosis not present

## 2018-04-11 DIAGNOSIS — Z953 Presence of xenogenic heart valve: Secondary | ICD-10-CM

## 2018-04-11 DIAGNOSIS — D509 Iron deficiency anemia, unspecified: Secondary | ICD-10-CM | POA: Diagnosis present

## 2018-04-11 DIAGNOSIS — R569 Unspecified convulsions: Secondary | ICD-10-CM | POA: Diagnosis not present

## 2018-04-11 DIAGNOSIS — I10 Essential (primary) hypertension: Secondary | ICD-10-CM | POA: Diagnosis present

## 2018-04-11 DIAGNOSIS — S065XAA Traumatic subdural hemorrhage with loss of consciousness status unknown, initial encounter: Secondary | ICD-10-CM | POA: Diagnosis present

## 2018-04-11 DIAGNOSIS — Z89511 Acquired absence of right leg below knee: Secondary | ICD-10-CM | POA: Diagnosis not present

## 2018-04-11 DIAGNOSIS — E041 Nontoxic single thyroid nodule: Secondary | ICD-10-CM | POA: Diagnosis present

## 2018-04-11 DIAGNOSIS — R351 Nocturia: Secondary | ICD-10-CM | POA: Diagnosis present

## 2018-04-11 DIAGNOSIS — N400 Enlarged prostate without lower urinary tract symptoms: Secondary | ICD-10-CM

## 2018-04-11 DIAGNOSIS — I6201 Nontraumatic acute subdural hemorrhage: Principal | ICD-10-CM | POA: Diagnosis present

## 2018-04-11 DIAGNOSIS — I272 Pulmonary hypertension, unspecified: Secondary | ICD-10-CM | POA: Diagnosis present

## 2018-04-11 DIAGNOSIS — M129 Arthropathy, unspecified: Secondary | ICD-10-CM | POA: Diagnosis present

## 2018-04-11 DIAGNOSIS — E039 Hypothyroidism, unspecified: Secondary | ICD-10-CM | POA: Diagnosis present

## 2018-04-11 DIAGNOSIS — R972 Elevated prostate specific antigen [PSA]: Secondary | ICD-10-CM | POA: Diagnosis present

## 2018-04-11 DIAGNOSIS — R1310 Dysphagia, unspecified: Secondary | ICD-10-CM | POA: Diagnosis present

## 2018-04-11 DIAGNOSIS — E559 Vitamin D deficiency, unspecified: Secondary | ICD-10-CM | POA: Diagnosis present

## 2018-04-11 DIAGNOSIS — Z4659 Encounter for fitting and adjustment of other gastrointestinal appliance and device: Secondary | ICD-10-CM

## 2018-04-11 DIAGNOSIS — Z8719 Personal history of other diseases of the digestive system: Secondary | ICD-10-CM

## 2018-04-11 DIAGNOSIS — R4587 Impulsiveness: Secondary | ICD-10-CM | POA: Diagnosis not present

## 2018-04-11 DIAGNOSIS — M792 Neuralgia and neuritis, unspecified: Secondary | ICD-10-CM

## 2018-04-11 DIAGNOSIS — D638 Anemia in other chronic diseases classified elsewhere: Secondary | ICD-10-CM | POA: Diagnosis not present

## 2018-04-11 DIAGNOSIS — I082 Rheumatic disorders of both aortic and tricuspid valves: Secondary | ICD-10-CM | POA: Diagnosis present

## 2018-04-11 DIAGNOSIS — G40909 Epilepsy, unspecified, not intractable, without status epilepticus: Secondary | ICD-10-CM | POA: Diagnosis not present

## 2018-04-11 DIAGNOSIS — Z79890 Hormone replacement therapy (postmenopausal): Secondary | ICD-10-CM

## 2018-04-11 DIAGNOSIS — Z6821 Body mass index (BMI) 21.0-21.9, adult: Secondary | ICD-10-CM | POA: Diagnosis not present

## 2018-04-11 DIAGNOSIS — J9600 Acute respiratory failure, unspecified whether with hypoxia or hypercapnia: Secondary | ICD-10-CM | POA: Diagnosis present

## 2018-04-11 DIAGNOSIS — Z79899 Other long term (current) drug therapy: Secondary | ICD-10-CM

## 2018-04-11 DIAGNOSIS — E785 Hyperlipidemia, unspecified: Secondary | ICD-10-CM | POA: Diagnosis present

## 2018-04-11 DIAGNOSIS — E876 Hypokalemia: Secondary | ICD-10-CM | POA: Diagnosis present

## 2018-04-11 DIAGNOSIS — E7849 Other hyperlipidemia: Secondary | ICD-10-CM | POA: Diagnosis not present

## 2018-04-11 DIAGNOSIS — Z7982 Long term (current) use of aspirin: Secondary | ICD-10-CM

## 2018-04-11 DIAGNOSIS — J9690 Respiratory failure, unspecified, unspecified whether with hypoxia or hypercapnia: Secondary | ICD-10-CM

## 2018-04-11 DIAGNOSIS — R0602 Shortness of breath: Secondary | ICD-10-CM

## 2018-04-11 DIAGNOSIS — I480 Paroxysmal atrial fibrillation: Secondary | ICD-10-CM | POA: Diagnosis not present

## 2018-04-11 DIAGNOSIS — E873 Alkalosis: Secondary | ICD-10-CM | POA: Diagnosis not present

## 2018-04-11 LAB — DIFFERENTIAL
Abs Immature Granulocytes: 0.03 10*3/uL (ref 0.00–0.07)
BASOS ABS: 0.1 10*3/uL (ref 0.0–0.1)
BASOS PCT: 1 %
Eosinophils Absolute: 0.3 10*3/uL (ref 0.0–0.5)
Eosinophils Relative: 4 %
Immature Granulocytes: 0 %
Lymphocytes Relative: 32 %
Lymphs Abs: 2.3 10*3/uL (ref 0.7–4.0)
MONO ABS: 0.5 10*3/uL (ref 0.1–1.0)
Monocytes Relative: 7 %
NEUTROS ABS: 4.1 10*3/uL (ref 1.7–7.7)
NEUTROS PCT: 56 %

## 2018-04-11 LAB — CBC
HCT: 35.4 % — ABNORMAL LOW (ref 39.0–52.0)
HEMOGLOBIN: 10.3 g/dL — AB (ref 13.0–17.0)
MCH: 21.1 pg — ABNORMAL LOW (ref 26.0–34.0)
MCHC: 29.1 g/dL — AB (ref 30.0–36.0)
MCV: 72.5 fL — ABNORMAL LOW (ref 80.0–100.0)
Platelets: 182 10*3/uL (ref 150–400)
RBC: 4.88 MIL/uL (ref 4.22–5.81)
RDW: 16.4 % — AB (ref 11.5–15.5)
WBC: 7.3 10*3/uL (ref 4.0–10.5)
nRBC: 0 % (ref 0.0–0.2)

## 2018-04-11 LAB — COMPREHENSIVE METABOLIC PANEL
ALT: 10 U/L (ref 0–44)
ANION GAP: 8 (ref 5–15)
AST: 16 U/L (ref 15–41)
Albumin: 3.2 g/dL — ABNORMAL LOW (ref 3.5–5.0)
Alkaline Phosphatase: 57 U/L (ref 38–126)
BUN: 11 mg/dL (ref 8–23)
CO2: 22 mmol/L (ref 22–32)
Calcium: 8.5 mg/dL — ABNORMAL LOW (ref 8.9–10.3)
Chloride: 110 mmol/L (ref 98–111)
Creatinine, Ser: 1.04 mg/dL (ref 0.61–1.24)
GFR calc non Af Amer: >60 mL/min (ref >60)
GLUCOSE: 116 mg/dL — AB (ref 70–99)
POTASSIUM: 3.5 mmol/L (ref 3.5–5.1)
SODIUM: 140 mmol/L (ref 135–145)
Total Bilirubin: 0.4 mg/dL (ref 0.3–1.2)
Total Protein: 6.4 g/dL — ABNORMAL LOW (ref 6.5–8.1)

## 2018-04-11 LAB — POCT I-STAT 7, (LYTES, BLD GAS, ICA,H+H)
Acid-base deficit: 1 mmol/L (ref 0.0–2.0)
Bicarbonate: 22.2 mmol/L (ref 20.0–28.0)
Calcium, Ion: 1.2 mmol/L (ref 1.15–1.40)
HCT: 34 % — ABNORMAL LOW (ref 39.0–52.0)
Hemoglobin: 11.6 g/dL — ABNORMAL LOW (ref 13.0–17.0)
O2 SAT: 99 %
Patient temperature: 98.6
Potassium: 3.4 mmol/L — ABNORMAL LOW (ref 3.5–5.1)
Sodium: 141 mmol/L (ref 135–145)
TCO2: 23 mmol/L (ref 22–32)
pCO2 arterial: 32.4 mmHg (ref 32.0–48.0)
pH, Arterial: 7.443 (ref 7.350–7.450)
pO2, Arterial: 110 mmHg — ABNORMAL HIGH (ref 83.0–108.0)

## 2018-04-11 LAB — APTT: APTT: 31 s (ref 24–36)

## 2018-04-11 LAB — TYPE AND SCREEN
ABO/RH(D): O POS
Antibody Screen: NEGATIVE

## 2018-04-11 LAB — PROTIME-INR
INR: 1.12
Prothrombin Time: 14.3 seconds (ref 11.4–15.2)

## 2018-04-11 LAB — I-STAT CREATININE, ED: CREATININE: 0.9 mg/dL (ref 0.61–1.24)

## 2018-04-11 LAB — MRSA PCR SCREENING: MRSA by PCR: NEGATIVE

## 2018-04-11 LAB — CBG MONITORING, ED: Glucose-Capillary: 104 mg/dL — ABNORMAL HIGH (ref 70–99)

## 2018-04-11 LAB — I-STAT TROPONIN, ED: Troponin i, poc: 0 ng/mL (ref 0.00–0.08)

## 2018-04-11 LAB — TRIGLYCERIDES: Triglycerides: 184 mg/dL — ABNORMAL HIGH (ref <150)

## 2018-04-11 MED ORDER — LEVETIRACETAM IN NACL 1000 MG/100ML IV SOLN: 1000.0000 mg | Freq: Once | INTRAVENOUS | Status: DC

## 2018-04-11 MED ORDER — ONDANSETRON HCL 4 MG/2ML IJ SOLN
INTRAMUSCULAR | Status: AC
  Filled 2018-04-11: qty 2

## 2018-04-11 MED ORDER — PROPOFOL 1000 MG/100ML IV EMUL
INTRAVENOUS | Status: AC
  Administered 2018-04-11: 5 ug/kg/min via INTRAVENOUS
  Filled 2018-04-11: qty 100

## 2018-04-11 MED ORDER — PANTOPRAZOLE SODIUM 40 MG IV SOLR
40.0000 mg | Freq: Every day | INTRAVENOUS | Status: DC
  Administered 2018-04-11 – 2018-04-13 (×3): 40 mg via INTRAVENOUS
  Filled 2018-04-11 (×3): qty 40

## 2018-04-11 MED ORDER — ORAL CARE MOUTH RINSE
15.0000 mL | OROMUCOSAL | Status: DC
  Administered 2018-04-11 – 2018-04-14 (×32): 15 mL via OROMUCOSAL

## 2018-04-11 MED ORDER — SODIUM CHLORIDE 0.9% IV SOLUTION
Freq: Once | INTRAVENOUS | Status: AC
  Administered 2018-04-11: 09:00:00 via INTRAVENOUS

## 2018-04-11 MED ORDER — ETOMIDATE 2 MG/ML IV SOLN
20.0000 mg | Freq: Once | INTRAVENOUS | Status: AC
  Administered 2018-04-11: 20 mg via INTRAVENOUS

## 2018-04-11 MED ORDER — LEVETIRACETAM IN NACL 500 MG/100ML IV SOLN: 500.0000 mg | Freq: Two times a day (BID) | INTRAVENOUS | Status: DC

## 2018-04-11 MED ORDER — MIDAZOLAM 50MG/50ML (1MG/ML) PREMIX INFUSION
0.5000 mg/h | INTRAVENOUS | Status: DC
  Filled 2018-04-11: qty 50

## 2018-04-11 MED ORDER — ROCURONIUM BROMIDE 50 MG/5ML IV SOLN
50.0000 mg | Freq: Once | INTRAVENOUS | Status: AC
  Administered 2018-04-11: 50 mg via INTRAVENOUS

## 2018-04-11 MED ORDER — POTASSIUM CHLORIDE 20 MEQ/15ML (10%) PO SOLN
40.0000 meq | ORAL | Status: AC
  Administered 2018-04-11: 40 meq via ORAL
  Filled 2018-04-11: qty 30

## 2018-04-11 MED ORDER — LEVETIRACETAM IN NACL 1000 MG/100ML IV SOLN
1000.0000 mg | INTRAVENOUS | Status: AC
  Administered 2018-04-11: 1000 mg via INTRAVENOUS
  Filled 2018-04-11: qty 100

## 2018-04-11 MED ORDER — CHLORHEXIDINE GLUCONATE 0.12% ORAL RINSE (MEDLINE KIT)
15.0000 mL | Freq: Two times a day (BID) | OROMUCOSAL | Status: DC
  Administered 2018-04-11 – 2018-04-16 (×11): 15 mL via OROMUCOSAL

## 2018-04-11 MED ORDER — FENTANYL 2500MCG IN NS 250ML (10MCG/ML) PREMIX INFUSION
0.0000 ug/h | INTRAVENOUS | Status: DC
  Administered 2018-04-11: 25 ug/h via INTRAVENOUS
  Administered 2018-04-11 (×2): 50 ug/h via INTRAVENOUS
  Administered 2018-04-12: 25 ug/h via INTRAVENOUS
  Filled 2018-04-11 (×2): qty 250

## 2018-04-11 MED ORDER — ONDANSETRON HCL 4 MG/2ML IJ SOLN
4.0000 mg | Freq: Once | INTRAMUSCULAR | Status: AC
  Administered 2018-04-11: 4 mg via INTRAVENOUS

## 2018-04-11 MED ORDER — SODIUM CHLORIDE 0.9% FLUSH
3.0000 mL | Freq: Once | INTRAVENOUS | Status: DC
Start: 2018-04-11 — End: 2018-04-12

## 2018-04-11 MED ORDER — LEVETIRACETAM IN NACL 500 MG/100ML IV SOLN
500.0000 mg | Freq: Two times a day (BID) | INTRAVENOUS | Status: DC
  Administered 2018-04-11 – 2018-04-15 (×8): 500 mg via INTRAVENOUS
  Filled 2018-04-11 (×8): qty 100

## 2018-04-11 MED ORDER — PROPOFOL 1000 MG/100ML IV EMUL
5.0000 ug/kg/min | INTRAVENOUS | Status: DC
  Administered 2018-04-11: 15 ug/kg/min via INTRAVENOUS
  Administered 2018-04-11: 5 ug/kg/min via INTRAVENOUS
  Administered 2018-04-11 – 2018-04-12 (×2): 10 ug/kg/min via INTRAVENOUS
  Administered 2018-04-13: 5 ug/kg/min via INTRAVENOUS
  Administered 2018-04-13: 10 ug/kg/min via INTRAVENOUS
  Filled 2018-04-11 (×5): qty 100

## 2018-04-11 MED ORDER — LEVOTHYROXINE SODIUM 100 MCG/5ML IV SOLN
25.0000 ug | Freq: Every day | INTRAVENOUS | Status: DC
  Administered 2018-04-11 – 2018-04-12 (×2): 25 ug via INTRAVENOUS
  Filled 2018-04-11 (×3): qty 5

## 2018-04-11 NOTE — Progress Notes (Signed)
Initial Nutrition Assessment  DOCUMENTATION CODES:   Non-severe (moderate) malnutrition in context of chronic illness  INTERVENTION:   If pt to remain intubated recommend Vital AF 1.2 @ 50 ml/hr (1200 ml/day) 30 ml Prostat BID  Provides: 1640 kcal, 120 grams protein, and 973 ml free water TF regimen and propofol at current rate providing 1769 total kcal/day (100 % of kcal needs)  NUTRITION DIAGNOSIS:   Moderate Malnutrition related to chronic illness as evidenced by mild fat depletion, moderate fat depletion, moderate muscle depletion, severe muscle depletion.  GOAL:   Provide needs based on ASPEN/SCCM guidelines  MONITOR:   I & O's, Vent status  REASON FOR ASSESSMENT:   Ventilator    ASSESSMENT:   Pt with PMH of seizure disorder, CAD, gastroparesis, HTN, Iron deficiency, pulmonary HTN, s/p TAVR, vitamin D deficiency, hypercholesterolemia and recent R BKA admitted from home after being found down with bilateral acute/subacute SDH.    Pt discussed during ICU rounds and with RN.  Per RN pt is not following commands and will likely not be extubated for a few days.  OG in place   Per wife pt lost weight with BKA but has a good appetite now. He eats 3 meals per day.  Breakfast: eggs, grits Lunch: MOW Dinner: leftover MOW or someone cooks them dinner She also describes that pt's teeth were removed before a heart procedure last year but is not specific.  Per wife pt uses a cane and/or walker for support at home.   Patient is currently intubated on ventilator support MV: 9.7 L/min Temp (24hrs), Avg:97.2 F (36.2 C), Min:96.5 F (35.8 C), Max:98.4 F (36.9 C)  Propofol: 4.9 ml/hr provides: 129 kcal  Medications reviewed and include: 40 mEq KCl every 4 hours Labs reviewed    NUTRITION - FOCUSED PHYSICAL EXAM:    Most Recent Value  Orbital Region  Moderate depletion  Upper Arm Region  Mild depletion  Thoracic and Lumbar Region  No depletion  Buccal Region   Unable to assess  Temple Region  Moderate depletion  Clavicle Bone Region  Moderate depletion  Clavicle and Acromion Bone Region  Mild depletion  Scapular Bone Region  Unable to assess  Dorsal Hand  Unable to assess  Patellar Region  Severe depletion  Anterior Thigh Region  Severe depletion  Posterior Calf Region  Moderate depletion  Edema (RD Assessment)  None  Hair  Unable to assess [no hair]  Eyes  Unable to assess  Mouth  Unable to assess  Skin  Reviewed  Nails  Unable to assess       Diet Order:   Diet Order            Diet NPO time specified  Diet effective now              EDUCATION NEEDS:   No education needs have been identified at this time  Skin:     Last BM:  unknown  Height:   Ht Readings from Last 1 Encounters:  04/11/18 6\' 2"  (1.88 m)    Weight:   Wt Readings from Last 1 Encounters:  04/11/18 81.6 kg    Ideal Body Weight:  81.4 kg  BMI:  Body mass index is 23.11 kg/m.  Estimated Nutritional Needs:   Kcal:  1737  Protein:  100-120 grams  Fluid:  > 1.7 L/day  Kendell Bane RD, LDN, CNSC [phone] Pager [phone] After Hours Pager

## 2018-04-11 NOTE — ED Notes (Signed)
Neuro surgery at bedside.

## 2018-04-11 NOTE — Progress Notes (Signed)
Pt arrived to unit from ED around 1000.  Pt on vent and sedation.  RN will continue to monitor.

## 2018-04-11 NOTE — ED Notes (Signed)
Patient intubated by Dr. Nicanor Alcon with RT .

## 2018-04-11 NOTE — ED Notes (Signed)
1st unit FFP started at 120 ml/hr. , IV sites intact , respirations unlabored , ETT/OGT intact , Propofol and Fentanyl drip infusing .

## 2018-04-11 NOTE — ED Triage Notes (Signed)
Pt BIB [facility] from home, LSN 2200 last night. Pt found on the floor by family. Altered from baseline. Combative with EMS, given 7.5mg  versed PTA.

## 2018-04-11 NOTE — Progress Notes (Signed)
Patient intubated and sedated,  moving all 4 extremities purposefully, no nystagmus or gaze deviation.  No further clinical seizures.

## 2018-04-11 NOTE — Progress Notes (Signed)
eLink Physician-Brief Progress Note Patient Name: Charles Patrick DOB: 27-Apr-1924 MRN: [id number]   Date of Service  04/11/2018  HPI/Events of Note  85 yr old male 1) found unresponsive. Low GCS code stroke. CT head acute on chronic SDH. On Vent. Neurology asked for urgent Neuro surgery consult.  2) TAVR/HTN/PAH. resp failure.   eICU Interventions  Notified Hoffman NP.      Intervention Category Intermediate Interventions: Communication with other healthcare providers and/or family  Ranee Gosselin 04/11/2018, 5:51 AM

## 2018-04-11 NOTE — Progress Notes (Signed)
Patient transported from ED to 4N24 without complications.

## 2018-04-11 NOTE — ED Notes (Signed)
ETT/OGT intact , IV sites unremarkable , portable chest x-ray done .

## 2018-04-11 NOTE — ED Notes (Addendum)
No adverse reaction noted , respirations unlabored , IV sites intact ETT/OGT intact  , 1st unit FFP rate increased to 250 ml/hr.

## 2018-04-11 NOTE — Consult Note (Addendum)
Neurology Consultation  Reason for Consult: Code stroke for left facial droop and altered mental status Referring Physician: Dr. Daun Peacock  CC: Left facial droop altered mental status  History is obtained from: Chart, EMS.  Attempted to call the son-no answer  HPI: Charles Patrick is a 85 y.o. male past medical history of aortic valve stenosis status post TAVR, carotid artery disease, hypertension, hyperglycemia, hypothyroidism, seizure disorder, presented to the emergency room via EMS for left facial droop and altered mental status.  Patient was last seen normal at 10 PM on 04/10/2018.  Found this morning on the floor by family, unresponsive and upon coaxing her response, was altered.  Per EMS, at baseline he is alert awake oriented x4.  In route with EMS, he was very agitated and aggressive.  He required 7.5 mg of Versed IV to control his agitation.  He was evaluated emergently in the emergency room at the bridge.  He was obtunded, did not open his eyes to voice, did not have a gaze deviation, no active seizures, was moving all 4 extremities spontaneously and to noxious stimulation.  Taken for a noncontrast head CT stat that showed bilateral mixed density subdural hematomas.  Brought back into the room and probably will require intubation as he is likely not to protect his airway.  No acute stroke intervention-no TPA due to mixed density subdural and no IV TPA due to nonfocal exam.  LKW: 10 PM on 04/10/2018 tpa given?: no, subdural hematomas bilaterally Premorbid modified Rankin scale (mRS): At least 2 due to amputation.  Family not reachable.  Unclear mRS at this time.  ROS: Unable to obtain due to altered mental status.   Past Medical History:  Diagnosis Date  . Anemia   . Aortic valve stenosis   . Arthropathy   . BPH associated with nocturia   . Carotid artery disease (HCC)   . Colon polyp   . Dilated aortic root (HCC)   . Elevated PSA   . Erectile dysfunction   . Gastric paresis    . Hypercholesterolemia   . Hyperglycemia   . Hypertension    Patient denies  . Hypothyroid   . Iron deficiency   . Pulmonary hypertension (HCC)   . 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 Seizure disorder (HCC)   . Thrombocytopenia (HCC)   . Thyroid nodule   . Tricuspid regurgitation   . Vitamin D deficiency     Family History  Problem Relation Age of Onset  . Healthy Mother   . Healthy Father     Social History:   reports that he has never smoked. He has never used smokeless tobacco. He reports that he does not drink alcohol or use drugs.  Medications No current facility-administered medications for this encounter.   Current Outpatient Medications:  .  acetaminophen (TYLENOL) 500 MG tablet, Take 2 tablets (1,000 mg total) by mouth every 8 (eight) hours., Disp: 30 tablet, Rfl: 0 .  amoxicillin (AMOXIL) 500 MG tablet, Take 4 tablets by mouth one hour prior to dental appointments and stomach or bladder procedures, Disp: 4 tablet, Rfl: 3 .  aspirin EC 81 MG tablet, Take 81 mg by mouth daily. , Disp: , Rfl:  .  clopidogrel (PLAVIX) 75 MG tablet, Take 1 tablet (75 mg total) by mouth daily with breakfast., Disp: 30 tablet, Rfl: 5 .  docusate sodium (COLACE) 100 MG capsule,  Take 1 capsule (100 mg total) by mouth 2 (two) times daily., Disp: 10 capsule, Rfl: 0 .  enoxaparin (LOVENOX) 40 MG/0.4ML injection, Inject 0.4 mLs (40 mg total) into the skin daily for 7 days., Disp: 7 Syringe, Rfl: 0 .  gabapentin (NEURONTIN) 300 MG capsule, Take 1 capsule (300 mg total) by mouth 2 (two) times daily., Disp: 60 capsule, Rfl: 0 .  levothyroxine (SYNTHROID, LEVOTHROID) 50 MCG tablet, Take 50 mcg by mouth daily before breakfast. , Disp: , Rfl:  .  Multiple Vitamin (MULTIVITAMIN) capsule, Take 1 capsule by mouth daily., Disp: , Rfl:  .  oxyCODONE (OXY IR/ROXICODONE) 5 MG  immediate release tablet, Take 1-2 tablets (5-10 mg total) by mouth every 6 (six) hours as needed for moderate pain or severe pain (pain score 4-6)., Disp: 30 tablet, Rfl: 0 .  simvastatin (ZOCOR) 40 MG tablet, Take 40 mg by mouth daily at 6 PM. , Disp: , Rfl:  .  tamsulosin (FLOMAX) 0.4 MG CAPS capsule, Take 0.4 mg by mouth daily after supper. , Disp: , Rfl:  .  vitamin C (VITAMIN C) 500 MG tablet, Take 1 tablet (500 mg total) by mouth daily., Disp: 30 tablet, Rfl: 1  Exam: Current vital signs: BP 103/77 (BP Location: Right Arm)   Pulse 79   Resp (!) 22   SpO2 95%  Vital signs in last 24 hours: Pulse Rate:  [79] 79 (02/14 0520) Resp:  [22] 22 (02/14 0520) BP: (103)/(77) 103/77 (02/14 0520) SpO2:  [95 %] 95 % (02/14 0520) General: Obtunded, opens eyes to noxious stimulation-E2 V2 M5 = GCS 10  HEENT: Normocephalic atraumatic dry mucous membranes Lungs: Clear to auscultation Cardiovascular: S1-S2 heard regular rate rhythm Abdomen: None tender but mildly distended with hyperactive bowel sounds. Extremities: Right BKA, otherwise no edema.  Intact pulses. Neurological exam Obtunded, only opens eyes to noxious stimulation. Does not follow commands.  Completely incomprehensible moaning only. Cranial nerves: Left pupil is 2 mm and reactive, right corneal opacity and unable to visualize pupil, gaze midline, face appears symmetric with possible if any mild flattening of the right nasolabial fold, has a strong gag. Motor exam: Moves all 4 extremities spontaneously and localizes to noxious stimulation with all 4 with great vigor. Sensory exam: As above Coordination cannot be assessed Gait cannot be assessed NIHSS 1a Level of Conscious.: 2 1b LOC Questions: 2 1c LOC Commands: 2 2 Best Gaze: 0 3 Visual: 0 4 Facial Palsy: 1 5a Motor Arm - left: 2 5b Motor Arm - Right: 2 6a Motor Leg - Left: 2 6b Motor Leg - Right: 2 7 Limb Ataxia: 0 8 Sensory: 0 9 Best Language: 3 10 Dysarthria: 2 11  Extinct. and Inatten.: 0 TOTAL: 20  Labs I have reviewed labs in epic and the results pertinent to this consultation are:  CBC    Component Value Date/Time   WBC 7.3 04/11/2018 0515   RBC 4.88 04/11/2018 0515   HGB 10.3 (L) 04/11/2018 0515   HCT 35.4 (L) 04/11/2018 0515   PLT 182 04/11/2018 0515   MCV 72.5 (L) 04/11/2018 0515   MCH 21.1 (L) 04/11/2018 0515   MCHC 29.1 (L) 04/11/2018 0515   RDW 16.4 (H) 04/11/2018 0515   LYMPHSABS 2.3 04/11/2018 0515   MONOABS 0.5 04/11/2018 0515   EOSABS 0.3 04/11/2018 0515   BASOSABS 0.1 04/11/2018 0515    CMP     Component Value Date/Time   NA 135 08/23/2017 0524   K 3.7 08/23/2017 0524  CL 98 08/23/2017 0524   CO2 28 08/23/2017 0524   GLUCOSE 100 (H) 08/23/2017 0524   BUN 9 08/23/2017 0524   CREATININE 0.90 04/11/2018 0520   CALCIUM 8.2 (L) 08/23/2017 0524   PROT 5.7 (L) 08/16/2017 0506   ALBUMIN 3.1 (L) 08/16/2017 0506   AST 21 08/16/2017 0506   ALT 13 (L) 08/16/2017 0506   ALKPHOS 49 08/16/2017 0506   BILITOT 0.5 08/16/2017 0506   GFRNONAA >60 08/23/2017 0524   GFRAA >60 08/23/2017 0524   Imaging I have reviewed the images obtained: CT-scan of the brain-mixed density bilateral subdurals.  Assessment: 85 year old man past medical history of aortic valve stenosis status post TAVR, carotid artery disease, hypertension, hyperglycemia, hypothyroidism and seizure disorder presented to the emergency room as an acute code stroke for left facial droop and altered mental status with a last known normal of 10 PM yesterday. GCS 10 at best on arrival.  Nonfocal exam.  CT head with next density bilateral subdural hematomas. Not a candidate for TPA or endovascular thrombectomy. No reported seizure.  Has a history of seizure disorder.  Unclear what medication he is on.  Outside neurology notes not available for review.  Impression: --Bilateral mixed density subdural hematomas --Known seizure disorder - evaluate for underlying  electrographic disturbance. --Toxic metabolic encephalopathy. --Possible aspiration pneumonia  Recommendations: --Urgent neurosurgical consultation due to bilateral subdural hematomas. --Patient has been intubated due to deteriorating mental status and inability to protect airway. Will be admitted to the ICU. --Obtain routine EEG in the morning --Loaded with Keppra 1 g IV now.  Then continue Keppra 500 mg twice daily IV and changed to p.o. when able to take p.o. --Chest x-ray, urinalysis to rule out any evidence of infection causing lower seizure threshold in a patient with documented seizure disorder. --Management of possible aspiration pneumonia per primary team. --BP goals per NSGY -- Milon DikesAshish Natina Wiginton, MD [facility] Pager: [phone] If 7pm to 7am, please call on call as listed on AMION.  CRITICAL CARE ATTESTATION Performed by: Milon DikesAshish Dalasia Predmore, MD Total critical care time: 45 minutes Critical care time was exclusive of separately billable procedures and treating other patients and/or supervising APPs/Residents/Students Critical care was necessary to treat or prevent imminent or life-threatening deterioration due to subdural hemorrhage, toxic metabolic encephalopathy This patient is critically ill and at significant risk for neurological worsening and/or death and care requires constant monitoring. Critical care was time spent personally by me on the following activities: development of treatment plan with patient and/or surrogate as well as nursing, discussions with consultants, evaluation of patient's response to treatment, examination of patient, obtaining history from patient or surrogate, ordering and performing treatments and interventions, ordering and review of laboratory studies, ordering and review of radiographic studies, pulse oximetry, re-evaluation of patient's condition, participation in multidisciplinary rounds and medical decision making of high complexity in the  care of this patient.

## 2018-04-11 NOTE — H&P (Signed)
NAME:  Charles KaufmannDavid B Patrick, MRN:  [id number], DOB:  09/04/1924, LOS: 0 ADMISSION DATE:  04/11/2018, CONSULTATION DATE: February 14 REFERRING MD: Dr. Nicanor AlconPalumbo, CHIEF COMPLAINT: Altered mental status, subdural hematoma  Brief History   85 year old male unresponsive at home with seizure history also found to have bilateral acute/subacute subdural hematoma.  Intubated in the emergency department  History of present illness   85 year old male with past medical history as below which is significant for seizure disorder who is normally alert and oriented x4 at baseline per family.  He was found to be altered and shortly after, unresponsive at home in the late p.m. hours of 2/13.  [facility] department just before 5 AM on 2/14 by EMS who described the patient as combative in route requiring 7.5 mg Versed.  He presented as a code stroke was taken emergently to the CT scanner for a CT head which demonstrated acute on chronic subdural hematoma.  After CT the EDP described retching and concern for airway protection and thus, the patient was intubated.  The patient has been seen by neurology and neurosurgical consultation was pending.  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  Significant Hospital Events   2/14 admit  Consults:  Neurology Neurosurgery  Procedures:  ETT 2/14 >  Significant Diagnostic Tests:  CT head 2/14 > Bilateral primarily low-density  subdural collection along the cerebral convexities measuring up to 9 mm in thickness.  Micro Data:    Antimicrobials:     Interim history/subjective:    Objective   Blood pressure (!) 176/128, pulse 86, resp. rate 15, height 6\' 2"  (1.88 m), SpO2 100 %.    Vent Mode: PRVC FiO2 (%):  [50 %] 50 % Set Rate:  [15 bmp] 15 bmp Vt Set:  [650 mL] 650 mL PEEP:  [5 cmH20] 5 cmH20  No intake or output data in the 24 hours ending 04/11/18 0627 There were no vitals filed for this visit.  Examination: General: Elderly appearing male on vent HENT: [place]/AT, L pupil reactive. R eye cloudy obscuring pupil and iris.  Lungs: Clear Cardiovascular: Tachy. regular Abdomen: Soft, non-distended. Normoactive.  Extremities: RLE BKA Neuro: Sedated/paralyzed immediately post intubation  Resolved Hospital Problem list     Assessment & Plan:   Bilateral subdural hematoma: the patient is on plavix as an outpatient. - Neurosurgery and Neurology consulted - FFP being ordered by EDP  - Frequent neuro checks  Acute encephalopathy: Lik[facility], but cannot rule out status epilepticus or post ictal as he has a history of seizure disorder, although no home meds listed for this.  - Neurology following - EEG - Keppra load and scheduled - Frequent neuro checks.  - Propofol and fentanyl for sedation. RASS goal 0 to -1.  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  Could not reach wife.  Disposition: ICU  Labs   CBC: Recent Labs  Lab 04/11/18 0515 04/11/18 0555  WBC 7.3  --   NEUTROABS 4.1  --   HGB 10.3* 11.6*  HCT 35.4* 34.0*  MCV 72.5*  --   PLT 182  --     Basic Metabolic Panel: Recent Labs  Lab 04/11/18 0515 04/11/18 0520 04/11/18 0555  NA 140  --  141  K 3.5  --  3.4*  CL 110  --   --   CO2 22  --   --   GLUCOSE 116*  --   --   BUN 11  --   --   CREATININE 1.04 0.90  --   CALCIUM 8.5*  --   --    GFR: CrCl cannot be calculated (Unknown ideal weight.). Recent Labs  Lab 04/11/18 0515  WBC 7.3    Liver Function Tests: Recent Labs  Lab 04/11/18 0515  AST 16  ALT 10  ALKPHOS 57  BILITOT 0.4  PROT 6.4*  ALBUMIN 3.2*   No results for input(s): LIPASE, AMYLASE in the last 168 hours. No results for input(s): AMMONIA in the last 168 hours.  ABG    Component Value Date/Time   PHART 7.443 04/11/2018 0555   PCO2ART 32.4 04/11/2018 0555   PO2ART 110.0 (H) 04/11/2018 0555   HCO3 22.2 04/11/2018 0555   TCO2 23 04/11/2018 0555   ACIDBASEDEF 1.0 04/11/2018 0555   O2SAT 99.0 04/11/2018 0555     Coagulation Profile: Recent Labs  Lab 04/11/18 0515  INR 1.12    Cardiac Enzymes: No results for input(s): CKTOTAL, CKMB, CKMBINDEX, TROPONINI in the last 168 hours.  HbA1C: Hgb A1c MFr Bld  Date/Time Value Ref Range Status  01/18/2017 11:32 AM 5.6 4.8 - 5.6 % Final    Comment:    (NOTE) Pre diabetes:          5.7%-6.4% Diabetes:              >6.4% Glycemic control for   <7.0% adults with diabetes     CBG: Recent Labs  Lab 04/11/18 0551  GLUCAP 104*    Review of Systems:   Unable as patient is encephalopathic   Past Medical History  He,  has a past medical history of Anemia, Aortic valve stenosis, Arthropathy, BPH associated with nocturia, Carotid artery disease (HCC), Colon polyp, Dilated aortic root (HCC), Elevated PSA, Erectile dysfunction, Gastric paresis, Hypercholesterolemia, Hyperglycemia,  Hypertension, Hypothyroid, Iron deficiency, Pulmonary hypertension (HCC), 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   Surgical History    Past Surgical History:  Procedure Laterality Date  . AMPUTATION Right 08/19/2017   Procedure: AMPUTATION BELOW KNEE;  Surgeon: Myrene Galas, MD;  Location: [facility] OR;  Service: Orthopedics;  Laterality: Right;  . EXTERNAL FIXATION LEG Right 08/14/2017   Procedure: EXTERNAL FIXATION LEG;  Surgeon: Toni Arthurs, MD;  Location: [facility] OR;  Service: Orthopedics;  Laterality: Right;  . I&D EXTREMITY Right 08/16/2017   Procedure: IRRIGATION AND DEBRIDEMENT EXTREMITY;  Surgeon: Myrene Galas, MD;  Location: [facility] OR;  Service: Orthopedics;  Laterality: Right;  . INCISION AND DRAINAGE Right 08/14/2017  Procedure: IRRIGATION AND DEBRIDMENT RIGHT ANKLE, APPLICATION WOUND VAC;  Surgeon: Toni Arthurs, MD;  Location: [facility] OR;  Service: Orthopedics;  Laterality: Right;  . MULTIPLE EXTRACTIONS WITH ALVEOLOPLASTY N/A 10/17/2016   Procedure: Extraction of tooth #'s 4, 21,22,27,28,29 with alveoloplasty;  Surgeon: Charlynne Pander, DDS;  Location: [facility] OR;  Service: Oral Surgery;  Laterality: N/A;  . NO PAST SURGERIES    . RIGHT/LEFT HEART CATH AND CORONARY ANGIOGRAPHY N/A 01/11/2017   Procedure: RIGHT/LEFT HEART CATH AND CORONARY ANGIOGRAPHY;  Surgeon: Tonny Bollman, MD;  Location: [facility] INVASIVE CV LAB;  Service: Cardiovascular;  Laterality: N/A;  . TEE WITHOUT CARDIOVERSION N/A 01/22/2017   Procedure: TRANSESOPHAGEAL ECHOCARDIOGRAM (TEE);  Surgeon: Tonny Bollman, MD;  Location: [facility] OR;  Service: Open Heart Surgery;  Laterality: N/A;  . TRANSCATHETER AORTIC VALVE REPLACEMENT, TRANSFEMORAL N/A 01/22/2017   Procedure: TRANSCATHETER AORTIC VALVE REPLACEMENT, TRANSFEMORAL using Edwards 29 Sapien 3 Aortic Valve;  Surgeon: Tonny Bollman, MD;  Location: [facility] OR;   Service: Open Heart Surgery;  Laterality: N/A;     Social History   reports that he has never smoked. He has never used smokeless tobacco. He reports that he does not drink alcohol or use drugs.   Family History   His family history includes Healthy in his father and mother.   Allergies No Known Allergies   Home Medications  Prior to Admission medications   Medication Sig Start Date End Date Taking? Authorizing Provider  acetaminophen (TYLENOL) 500 MG tablet Take 2 tablets (1,000 mg total) by mouth every 8 (eight) hours. 08/23/17   Montez Morita, PA-C  amoxicillin (AMOXIL) 500 MG tablet Take 4 tablets by mouth one hour prior to dental appointments and stomach or bladder procedures 01/30/17   Janetta Hora, PA-C  aspirin EC 81 MG tablet Take 81 mg by mouth daily.     [provider]  clopidogrel (PLAVIX) 75 MG tablet Take 1 tablet (75 mg total) by mouth daily with breakfast. 01/24/17   Janetta Hora, PA-C  docusate sodium (COLACE) 100 MG capsule Take 1 capsule (100 mg total) by mouth 2 (two) times daily. 08/23/17   Montez Morita, PA-C  enoxaparin (LOVENOX) 40 MG/0.4ML injection Inject 0.4 mLs (40 mg total) into the skin daily for 7 days. 08/24/17 08/31/17  Montez Morita, PA-C  gabapentin (NEURONTIN) 300 MG capsule Take 1 capsule (300 mg total) by mouth 2 (two) times daily. 08/23/17   Montez Morita, PA-C  levothyroxine (SYNTHROID, LEVOTHROID) 50 MCG tablet Take 50 mcg by mouth daily before breakfast.  01/27/14   [provider]  Multiple Vitamin (MULTIVITAMIN) capsule Take 1 capsule by mouth daily.    [provider]  oxyCODONE (OXY IR/ROXICODONE) 5 MG immediate release tablet Take 1-2 tablets (5-10 mg total) by mouth every 6 (six) hours as needed for moderate pain or severe pain (pain score 4-6). 08/23/17   Montez Morita, PA-C  simvastatin (ZOCOR) 40 MG tablet Take 40 mg by mouth daily at 6 PM.     [provider]  tamsulosin (FLOMAX) 0.4 MG CAPS capsule Take  0.4 mg by mouth daily after supper.  05/25/17   [provider]  vitamin C (VITAMIN C) 500 MG tablet Take 1 tablet (500 mg total) by mouth daily. 08/23/17   Montez Morita, PA-C     Critical care time: 35 mins      Joneen Roach, AGACNP-BC [facility] Pulmonary/Critical Care Pager [phone] or [phone]  04/11/2018 6:50 AM

## 2018-04-11 NOTE — Code Documentation (Addendum)
Rapid Response Event Note  Code Stroke - 0455  Patient LSN 2200, found down by family. L Facial Droop per EMS, also AMS/Combative per EMS. Received 7.5 mg Versed with EMS PTA. Patient taken for STAT CT HEAD - bilateral acute on chronic SDH. Patient was taken back to TRAUMA A and was intubated for airway protection. NIH was 20. NSU and PCCM to be consulted.   Start Time 0455 End Time [phone]

## 2018-04-11 NOTE — ED Notes (Signed)
Respiratory called for transferring assistance with pt to 4N

## 2018-04-11 NOTE — ED Notes (Signed)
Returned from CT scan.

## 2018-04-11 NOTE — ED Notes (Signed)
Respiratory informed this RN someone will be here shortly to assist in pt transfer.

## 2018-04-11 NOTE — ED Notes (Signed)
Patient arrived at CT scan with neurologist/nurse.

## 2018-04-11 NOTE — Consult Note (Signed)
Reason for Consult:Bilateral SDHs Referring Physician: Sarthak Tonn is an 85 y.o. male.  HPI: Charles Patrick is a 85 y.o. male past medical history of aortic valve stenosis status post TAVR, carotid artery disease, hypertension, hyperglycemia, hypothyroidism, seizure disorder, presented to the emergency room via EMS for left facial droop and altered mental status.  Patient was last seen normal at 10 PM on 04/10/2018.  Found this morning on the floor by family, unresponsive and upon coaxing her response, was altered.  Per EMS, at baseline he is alert awake oriented x4.  In route with EMS, he was very agitated and aggressive.  He required 7.5 mg of Versed IV to control his agitation.  He was evaluated emergently in the emergency room at the bridge.  He was obtunded, did not open his eyes to voice, did not have a gaze deviation, no active seizures, was moving all 4 extremities spontaneously and to noxious stimulation.  Taken for a noncontrast head CT stat that showed bilateral mixed density subdural hematomas.   Brought back into the room and probably will require intubation as he is likely not to protect his airway.  No acute stroke intervention-no TPA due to mixed density subdural and no IV TPA due to nonfocal exam.  Past Medical History:  Diagnosis Date  . Anemia   . Aortic valve stenosis   . Arthropathy   . BPH associated with nocturia   . Carotid artery disease (HCC)   . Colon polyp   . Dilated aortic root (HCC)   . Elevated PSA   . Erectile dysfunction   . Gastric paresis   . Hypercholesterolemia   . Hyperglycemia   . Hypertension    Patient denies  . Hypothyroid   . Iron deficiency   . Pulmonary hypertension (HCC)   . 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 Seizure disorder (HCC)   . Thrombocytopenia (HCC)   . Thyroid nodule   . Tricuspid  regurgitation   . Vitamin D deficiency     Past Surgical History:  Procedure Laterality Date  . AMPUTATION Right 08/19/2017   Procedure: AMPUTATION BELOW KNEE;  Surgeon: Myrene Galas, MD;  Location: [facility] OR;  Service: Orthopedics;  Laterality: Right;  . EXTERNAL FIXATION LEG Right 08/14/2017   Procedure: EXTERNAL FIXATION LEG;  Surgeon: Toni Arthurs, MD;  Location: [facility] OR;  Service: Orthopedics;  Laterality: Right;  . I&D EXTREMITY Right 08/16/2017   Procedure: IRRIGATION AND DEBRIDEMENT EXTREMITY;  Surgeon: Myrene Galas, MD;  Location: [facility] OR;  Service: Orthopedics;  Laterality: Right;  . INCISION AND DRAINAGE Right 08/14/2017   Procedure: IRRIGATION AND DEBRIDMENT RIGHT ANKLE, APPLICATION WOUND VAC;  Surgeon: Toni Arthurs, MD;  Location: [facility] OR;  Service: Orthopedics;  Laterality: Right;  . MULTIPLE EXTRACTIONS WITH ALVEOLOPLASTY N/A 10/17/2016   Procedure: Extraction of tooth #'s 4, 21,22,27,28,29 with alveoloplasty;  Surgeon: Charlynne Pander, DDS;  Location: [facility] OR;  Service: Oral Surgery;  Laterality: N/A;  . NO PAST SURGERIES    . RIGHT/LEFT HEART CATH AND CORONARY ANGIOGRAPHY N/A 01/11/2017   Procedure: RIGHT/LEFT HEART CATH AND CORONARY ANGIOGRAPHY;  Surgeon: Tonny Bollman, MD;  Location: [facility] INVASIVE CV LAB;  Service: Cardiovascular;  Laterality: N/A;  . TEE WITHOUT CARDIOVERSION N/A 01/22/2017   Procedure: TRANSESOPHAGEAL ECHOCARDIOGRAM (TEE);  Surgeon: Tonny Bollman, MD;  Location: [facility] OR;  Service: Open Heart Surgery;  Laterality: N/A;  .  TRANSCATHETER AORTIC VALVE REPLACEMENT, TRANSFEMORAL N/A 01/22/2017   Procedure: TRANSCATHETER AORTIC VALVE REPLACEMENT, TRANSFEMORAL using Edwards 29 Sapien 3 Aortic Valve;  Surgeon: Tonny Bollman, MD;  Location: [facility] OR;  Service: Open Heart Surgery;  Laterality: N/A;    Family History  Problem Relation Age of Onset  . Healthy Mother   . Healthy Father     Social History:  reports that he has never smoked. He has never used smokeless  tobacco. He reports that he does not drink alcohol or use drugs.  Allergies: No Known Allergies  Medications: I have reviewed the patient's current medications.  Results for orders placed or performed during the hospital encounter of 04/11/18 (from the past 48 hour(s))  I-Stat Troponin, ED (not at [facility])     Status: None   Collection Time: 04/11/18  5:12 AM  Result Value Ref Range   Troponin i, poc 0.00 0.00 - 0.08 ng/mL   Comment 3            Comment: Due to the release kinetics of cTnI, a negative result within the first hours of the onset of symptoms does not rule out myocardial infarction with certainty. If myocardial infarction is still suspected, repeat the test at appropriate intervals.   Protime-INR     Status: None   Collection Time: 04/11/18  5:15 AM  Result Value Ref Range   Prothrombin Time 14.3 11.4 - 15.2 seconds   INR 1.12     Comment: Performed at [facility], [address] [place], [place] [address]  APTT     Status: None   Collection Time: 04/11/18  5:15 AM  Result Value Ref Range   aPTT 31 24 - 36 seconds    Comment: Performed at [facility], [address], [place], [place] [address]  CBC     Status: Abnormal   Collection Time: 04/11/18  5:15 AM  Result Value Ref Range   WBC 7.3 4.0 - 10.5 K/uL   RBC 4.88 4.22 - 5.81 MIL/uL   Hemoglobin 10.3 (L) 13.0 - 17.0 g/dL   HCT 28.4 (L) 13.2 - 44.0 %   MCV 72.5 (L) 80.0 - 100.0 fL   MCH 21.1 (L) 26.0 - 34.0 pg   MCHC 29.1 (L) 30.0 - 36.0 g/dL   RDW 10.2 (H) 72.5 - 36.6 %   Platelets 182 150 - 400 K/uL   nRBC 0.0 0.0 - 0.2 %    Comment: Performed at [facility], [address] [place], [place] [address]  Differential     Status: None   Collection Time: 04/11/18  5:15 AM  Result Value Ref Range   Neutrophils Relative % 56 %   Neutro Abs 4.1 1.7 - 7.7 K/uL   Lymphocytes Relative 32 %   Lymphs Abs 2.3 0.7 - 4.0 K/uL   Monocytes Relative 7 %   Monocytes Absolute 0.5 0.1 - 1.0 K/uL    Eosinophils Relative 4 %   Eosinophils Absolute 0.3 0.0 - 0.5 K/uL   Basophils Relative 1 %   Basophils Absolute 0.1 0.0 - 0.1 K/uL   Immature Granulocytes 0 %   Abs Immature Granulocytes 0.03 0.00 - 0.07 K/uL    Comment: Performed at [facility], [address] [place], [place] [address]  Comprehensive metabolic panel     Status: Abnormal   Collection Time: 04/11/18  5:15 AM  Result Value Ref Range   Sodium 140 135 - 145 mmol/L  Potassium 3.5 3.5 - 5.1 mmol/L   Chloride 110 98 - 111 mmol/L   CO2 22 22 - 32 mmol/L   Glucose, Bld 116 (H) 70 - 99 mg/dL   BUN 11 8 - 23 mg/dL   Creatinine, Ser 1.611.04 0.61 - 1.24 mg/dL   Calcium 8.5 (L) 8.9 - 10.3 mg/dL   Total Protein 6.4 (L) 6.5 - 8.1 g/dL   Albumin 3.2 (L) 3.5 - 5.0 g/dL   AST 16 15 - 41 U/L   ALT 10 0 - 44 U/L   Alkaline Phosphatase 57 38 - 126 U/L   Total Bilirubin 0.4 0.3 - 1.2 mg/dL   GFR calc non Af Amer >60 >60 mL/min   GFR calc Af Amer >60 >60 mL/min   Anion gap 8 5 - 15    Comment: Performed at [facility], [address] [place], [place] [address]  I-Stat Creatinine, ED (not [facility])     Status: None   Collection Time: 04/11/18  5:20 AM  Result Value Ref Range   Creatinine, Ser 0.90 0.61 - 1.24 mg/dL  CBG monitoring, ED     Status: Abnormal   Collection Time: 04/11/18  5:51 AM  Result Value Ref Range   Glucose-Capillary 104 (H) 70 - 99 mg/dL  I-STAT 7, (LYTES, BLD GAS, ICA, H+H)     Status: Abnormal   Collection Time: 04/11/18  5:55 AM  Result Value Ref Range   pH, Arterial 7.443 7.350 - 7.450   pCO2 arterial 32.4 32.0 - 48.0 mmHg   pO2, Arterial 110.0 (H) 83.0 - 108.0 mmHg   Bicarbonate 22.2 20.0 - 28.0 mmol/L   TCO2 23 22 - 32 mmol/L   O2 Saturation 99.0 %   Acid-base deficit 1.0 0.0 - 2.0 mmol/L   Sodium 141 135 - 145 mmol/L   Potassium 3.4 (L) 3.5 - 5.1 mmol/L   Calcium, Ion 1.20 1.15 - 1.40 mmol/L   HCT 34.0 (L) 39.0 - 52.0 %   Hemoglobin 11.6 (L) 13.0 - 17.0 g/dL   Patient temperature 54.098.6  F    Collection site RADIAL, ALLEN'S TEST ACCEPTABLE    Drawn by RT    Sample type ARTERIAL   Prepare fresh frozen plasma     Status: None (Preliminary result)   Collection Time: 04/11/18  6:26 AM  Result Value Ref Range   Unit Number [id number]    Blood Component Type THAWED PLASMA    Unit division 00    Status of Unit ALLOCATED    Transfusion Status OK TO TRANSFUSE    Unit Number [id number]    Blood Component Type THAWED PLASMA    Unit division 00    Status of Unit ALLOCATED    Transfusion Status OK TO TRANSFUSE     Dg Chest Portable 1 View  Result Date: 04/11/2018 CLINICAL DATA:  Follow-up examination status post intubation. EXAM: PORTABLE CHEST 1 VIEW COMPARISON:  Prior radiograph from 08/21/2017 FINDINGS: Distal aspect of an endotracheal tube visualized, with tip well position approximately 2.3 cm above the carina. Enteric tube courses into the abdomen, side hole at the GE junction. Stable cardiomegaly with valvular prosthesis. Mediastinal silhouette grossly unchanged given patient rotation. Lungs mildly hypoinflated. Streaky left basilar opacity favored to reflect atelectasis. Mild diffuse pulmonary interstitial congestion without overt pulmonary edema. No other focal infiltrates. No definite pleural effusion. No visible pneumothorax, although the lung apices are incompletely visualized on this exam. IMPRESSION: 1. Endotracheal tube in place with tip position 2.3  cm above the carina. 2. Enteric tube in place, side hole at the level of the GE junction. 3. Mild diffuse pulmonary interstitial congestion without overt pulmonary edema. Superimposed streaky left basilar opacity favored to reflect atelectasis. Electronically Signed   By: Rise Mu M.D.   On: 04/11/2018 05:59   Ct Head Code Stroke Wo Contrast  Result Date: 04/11/2018 CLINICAL DATA:  Code stroke. Left-sided droop and altered mental status. EXAM: CT HEAD WITHOUT CONTRAST TECHNIQUE: Contiguous axial images were  obtained from the base of the skull through the vertex without intravenous contrast. COMPARISON:  None available FINDINGS: Brain: Bilateral subdural collection, primarily low-density. These measure up to 9 mm in thickness on both sides. Cortical mass effect is minimal. No infarct, hydrocephalus, or masslike finding. Vascular: Atherosclerotic calcification.  No high-density vessel. 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 Other: These results were called by telephone at the time of interpretation on 04/11/2018 at 5:20 am to Dr. Wilford Corner, who verbally acknowledged these results. ASPECTS [facility] Stroke Program Early CT Score) Not scored in the setting of collections. IMPRESSION: 1. Bilateral primarily low-density subdural collection along the cerebral convexities measuring up to 9 mm in thickness. No significant cortical mass effect or midline shift. 2. No evident infarct. Electronically Signed   By: Marnee Spring M.D.   On: 04/11/2018 05:21    Review of Systems - Negative except per HPI    Blood pressure (!) 176/128, pulse 86, resp. rate 15, height 5\' 7"  (1.702 m), weight 81.6 kg, SpO2 100 %. Physical Exam  Constitutional: He appears well-developed and well-nourished.  HENT:  Head: Normocephalic and atraumatic.  Eyes:  Opaque right eye  Reactive left pupil  Musculoskeletal:     Comments: Amputation right lower leg  Neurological: He is unresponsive.  Patient recently sedated for intubation.  Reportedly GCS 10 prior to intubation.  Currently unresponsive and no movement to painful stimuli, but was pulling at tubes, requiring restraints    Assessment/Plan: Patient is 85 year old man with extensive cardiac history, currently unresponsive.  Known history of seizures.  He is on plvix.  ubdural hematomas are small with minimal mass effect.  I would strongly advise "goals of care" conversation with family.  I do not believe there is any role for neurosurgical intervention at  this time and would be hard pressed to recommend craniotomies for subdural in the 85 year old man.  At this point, treat medically and provide supportive care.  Call with questions.  OK to obtain repeat head CT in AM to make sure SDH s are not enlarging. Dorian Heckle, MD 04/11/2018, 6:31 AM

## 2018-04-11 NOTE — Progress Notes (Signed)
eLink Physician-Brief Progress Note Patient Name: Charles Patrick DOB: 1924/09/08 MRN: [id number]   Date of Service  04/11/2018  HPI/Events of Note  Dr Venetia Maxon called back. No surgical intervention at this time. Family counseling. Can reverse plavix.    eICU Interventions  Above notified to Ed doc. A fib and HTN urgency. Advised to go for labetolol IV. Instead Cardene.      Intervention Category Intermediate Interventions: Communication with other healthcare providers and/or family;Arrhythmia - evaluation and management  Ranee Gosselin 04/11/2018, 6:33 AM

## 2018-04-11 NOTE — ED Notes (Signed)
Called [facility] to cancel Code Stroke

## 2018-04-11 NOTE — ED Provider Notes (Signed)
[facility] EMERGENCY DEPARTMENT Provider Note   CSN: [id number] Arrival date & time: 04/11/18  4132   An emergency department physician performed an initial assessment on this suspected stroke patient at 0510.  History   Chief Complaint Chief Complaint  Patient presents with  . Code Stroke    HPI Charles Patrick is a 85 y.o. male.  The history is provided by the EMS personnel. The history is limited by the condition of the patient.  Loss of Consciousness  Episode history:  Single Most recent episode:  Today (started at 10 pm ) Duration: found down by family  Timing:  Constant Progression:  Unchanged Chronicity:  New Relieved by:  Nothing Worsened by:  Nothing Ineffective treatments: 7.5 mg of versed en route. Associated symptoms: no vomiting   Associated symptoms comment:  Reported left facial droop Risk factors: vascular disease   Patient with a h/o TAVR and seizure disorder who was last seen normal at 10 pm and had a left facial droop and altered mental status.    Past Medical History:  Diagnosis Date  . Anemia   . Aortic valve stenosis   . Arthropathy   . BPH associated with nocturia   . Carotid artery disease (HCC)   . Colon polyp   . Dilated aortic root (HCC)   . Elevated PSA   . Erectile dysfunction   . Gastric paresis   . Hypercholesterolemia   . Hyperglycemia   . Hypertension    Patient denies  . Hypothyroid   . Iron deficiency   . Pulmonary hypertension (HCC)   . 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 Seizure disorder (HCC)   . Thrombocytopenia (HCC)   . Thyroid nodule   . Tricuspid regurgitation   . Vitamin D deficiency     Patient Active Problem List   Diagnosis Date Noted  . Below knee amputation status, right   . Acute blood loss anemia   . Anemia of chronic disease   . SIRS (systemic  inflammatory response syndrome) (HCC)   . Open right tibial fracture 08/15/2017  . Anemia   . 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 HTN (hypertension) 10/08/2016  . HLD (hyperlipidemia) 10/08/2016  . Hypothyroidism 10/08/2016  . Pulmonary hypertension (HCC) 10/08/2016  . Tricuspid regurgitation 10/08/2016  . Severe aortic stenosis     Past Surgical History:  Procedure Laterality Date  . AMPUTATION Right 08/19/2017   Procedure: AMPUTATION BELOW KNEE;  Surgeon: Myrene Galas, MD;  Location: [facility] OR;  Service: Orthopedics;  Laterality: Right;  . EXTERNAL FIXATION LEG Right 08/14/2017   Procedure: EXTERNAL FIXATION LEG;  Surgeon: Toni Arthurs, MD;  Location: [facility] OR;  Service: Orthopedics;  Laterality: Right;  . I&D EXTREMITY Right 08/16/2017   Procedure: IRRIGATION AND DEBRIDEMENT EXTREMITY;  Surgeon: Myrene Galas, MD;  Location: [facility] OR;  Service: Orthopedics;  Laterality: Right;  . INCISION AND DRAINAGE Right 08/14/2017   Procedure: IRRIGATION AND DEBRIDMENT RIGHT ANKLE, APPLICATION WOUND VAC;  Surgeon: Toni Arthurs, MD;  Location: [facility] OR;  Service: Orthopedics;  Laterality: Right;  . MULTIPLE EXTRACTIONS WITH ALVEOLOPLASTY N/A 10/17/2016   Procedure: Extraction of tooth #'s 4, 21,22,27,28,29 with alveoloplasty;  Surgeon: Charlynne Pander, DDS;  Location: [facility] OR;  Service: Oral Surgery;  Laterality: N/A;  . NO PAST SURGERIES    . RIGHT/LEFT HEART CATH AND CORONARY ANGIOGRAPHY N/A 01/11/2017  Procedure: RIGHT/LEFT HEART CATH AND CORONARY ANGIOGRAPHY;  Surgeon: Tonny Bollman, MD;  Location: [facility] INVASIVE CV LAB;  Service: Cardiovascular;  Laterality: N/A;  . TEE WITHOUT CARDIOVERSION N/A 01/22/2017   Procedure: TRANSESOPHAGEAL ECHOCARDIOGRAM (TEE);  Surgeon: Tonny Bollman, MD;  Location: [facility] OR;  Service: Open Heart Surgery;  Laterality: N/A;  . TRANSCATHETER AORTIC VALVE REPLACEMENT, TRANSFEMORAL N/A 01/22/2017   Procedure: TRANSCATHETER AORTIC VALVE REPLACEMENT,  TRANSFEMORAL using Edwards 29 Sapien 3 Aortic Valve;  Surgeon: Tonny Bollman, MD;  Location: [facility] OR;  Service: Open Heart Surgery;  Laterality: N/A;        Home Medications    Prior to Admission medications   Medication Sig Start Date End Date Taking? Authorizing Provider  acetaminophen (TYLENOL) 500 MG tablet Take 2 tablets (1,000 mg total) by mouth every 8 (eight) hours. 08/23/17   Montez Morita, PA-C  amoxicillin (AMOXIL) 500 MG tablet Take 4 tablets by mouth one hour prior to dental appointments and stomach or bladder procedures 01/30/17   Janetta Hora, PA-C  aspirin EC 81 MG tablet Take 81 mg by mouth daily.     [provider]  clopidogrel (PLAVIX) 75 MG tablet Take 1 tablet (75 mg total) by mouth daily with breakfast. 01/24/17   Janetta Hora, PA-C  docusate sodium (COLACE) 100 MG capsule Take 1 capsule (100 mg total) by mouth 2 (two) times daily. 08/23/17   Montez Morita, PA-C  enoxaparin (LOVENOX) 40 MG/0.4ML injection Inject 0.4 mLs (40 mg total) into the skin daily for 7 days. 08/24/17 08/31/17  Montez Morita, PA-C  gabapentin (NEURONTIN) 300 MG capsule Take 1 capsule (300 mg total) by mouth 2 (two) times daily. 08/23/17   Montez Morita, PA-C  levothyroxine (SYNTHROID, LEVOTHROID) 50 MCG tablet Take 50 mcg by mouth daily before breakfast.  01/27/14   [provider]  Multiple Vitamin (MULTIVITAMIN) capsule Take 1 capsule by mouth daily.    [provider]  oxyCODONE (OXY IR/ROXICODONE) 5 MG immediate release tablet Take 1-2 tablets (5-10 mg total) by mouth every 6 (six) hours as needed for moderate pain or severe pain (pain score 4-6). 08/23/17   Montez Morita, PA-C  simvastatin (ZOCOR) 40 MG tablet Take 40 mg by mouth daily at 6 PM.     [provider]  tamsulosin (FLOMAX) 0.4 MG CAPS capsule Take 0.4 mg by mouth daily after supper.  05/25/17   [provider]  vitamin C (VITAMIN C) 500 MG tablet Take 1 tablet (500 mg total) by mouth daily.  08/23/17   Montez Morita, PA-C    Family History Family History  Problem Relation Age of Onset  . Healthy Mother   . Healthy Father     Social History Social History   Tobacco Use  . Smoking status: Never Smoker  . Smokeless tobacco: Never Used  Substance Use Topics  . Alcohol use: No  . Drug use: No     Allergies   Patient has no known allergies.   Review of Systems Review of Systems  Unable to perform ROS: Patient unresponsive  Cardiovascular: Positive for syncope.  Gastrointestinal: Negative for vomiting.  Neurological: Positive for syncope and facial asymmetry.     Physical Exam Updated Vital Signs BP 103/77 (BP Location: Right Arm)   Pulse 79   Resp (!) 22   Ht 6\' 2"  (1.88 m)   SpO2 95%   BMI 21.83 kg/m   Physical Exam Vitals signs and nursing note reviewed.  Constitutional:  Appearance: He is well-developed.  HENT:     Head: Normocephalic and atraumatic.     Right Ear: Tympanic membrane normal.     Left Ear: Tympanic membrane normal.     Nose: Nose normal.  Eyes:     Comments: Pinpoint left pupil  Cardiovascular:     Rate and Rhythm: Normal rate and regular rhythm.     Pulses: Normal pulses.     Heart sounds: Normal heart sounds.  Pulmonary:     Breath sounds: No wheezing or rales.  Abdominal:     General: Bowel sounds are increased.     Palpations: Abdomen is soft.  Musculoskeletal:        General: No deformity.  Lymphadenopathy:     Cervical: No cervical adenopathy.  Skin:    General: Skin is warm and dry.     Capillary Refill: Capillary refill takes less than 2 seconds.     Coloration: Skin is not jaundiced.     Findings: No rash.  Neurological:     GCS: GCS eye subscore is 2. GCS verbal subscore is 1. GCS motor subscore is 4.     Deep Tendon Reflexes: Reflexes normal.  Psychiatric:     Comments: unable      ED Treatments / Results  Labs (all labs ordered are listed, but only abnormal results are displayed) Results for  orders placed or performed during the hospital encounter of 04/11/18  Protime-INR  Result Value Ref Range   Prothrombin Time 14.3 11.4 - 15.2 seconds   INR 1.12   APTT  Result Value Ref Range   aPTT 31 24 - 36 seconds  CBC  Result Value Ref Range   WBC 7.3 4.0 - 10.5 K/uL   RBC 4.88 4.22 - 5.81 MIL/uL   Hemoglobin 10.3 (L) 13.0 - 17.0 g/dL   HCT 69.6 (L) 29.5 - 28.4 %   MCV 72.5 (L) 80.0 - 100.0 fL   MCH 21.1 (L) 26.0 - 34.0 pg   MCHC 29.1 (L) 30.0 - 36.0 g/dL   RDW 13.2 (H) 44.0 - 10.2 %   Platelets 182 150 - 400 K/uL   nRBC 0.0 0.0 - 0.2 %  Differential  Result Value Ref Range   Neutrophils Relative % 56 %   Neutro Abs 4.1 1.7 - 7.7 K/uL   Lymphocytes Relative 32 %   Lymphs Abs 2.3 0.7 - 4.0 K/uL   Monocytes Relative 7 %   Monocytes Absolute 0.5 0.1 - 1.0 K/uL   Eosinophils Relative 4 %   Eosinophils Absolute 0.3 0.0 - 0.5 K/uL   Basophils Relative 1 %   Basophils Absolute 0.1 0.0 - 0.1 K/uL   Immature Granulocytes 0 %   Abs Immature Granulocytes 0.03 0.00 - 0.07 K/uL  Comprehensive metabolic panel  Result Value Ref Range   Sodium 140 135 - 145 mmol/L   Potassium 3.5 3.5 - 5.1 mmol/L   Chloride 110 98 - 111 mmol/L   CO2 22 22 - 32 mmol/L   Glucose, Bld 116 (H) 70 - 99 mg/dL   BUN 11 8 - 23 mg/dL   Creatinine, Ser 7.25 0.61 - 1.24 mg/dL   Calcium 8.5 (L) 8.9 - 10.3 mg/dL   Total Protein 6.4 (L) 6.5 - 8.1 g/dL   Albumin 3.2 (L) 3.5 - 5.0 g/dL   AST 16 15 - 41 U/L   ALT 10 0 - 44 U/L   Alkaline Phosphatase 57 38 - 126 U/L   Total Bilirubin 0.4  0.3 - 1.2 mg/dL   GFR calc non Af Amer >60 >60 mL/min   GFR calc Af Amer >60 >60 mL/min   Anion gap 8 5 - 15  CBG monitoring, ED  Result Value Ref Range   Glucose-Capillary 104 (H) 70 - 99 mg/dL  I-Stat Troponin, ED ([facility])  Result Value Ref Range   Troponin i, poc 0.00 0.00 - 0.08 ng/mL   Comment 3          I-Stat Creatinine, ED [facility])  Result Value Ref Range   Creatinine, Ser 0.90 0.61 - 1.24 mg/dL    I-STAT 7, (LYTES, BLD GAS, ICA, H+H)  Result Value Ref Range   pH, Arterial 7.443 7.350 - 7.450   pCO2 arterial 32.4 32.0 - 48.0 mmHg   pO2, Arterial 110.0 (H) 83.0 - 108.0 mmHg   Bicarbonate 22.2 20.0 - 28.0 mmol/L   TCO2 23 22 - 32 mmol/L   O2 Saturation 99.0 %   Acid-base deficit 1.0 0.0 - 2.0 mmol/L   Sodium 141 135 - 145 mmol/L   Potassium 3.4 (L) 3.5 - 5.1 mmol/L   Calcium, Ion 1.20 1.15 - 1.40 mmol/L   HCT 34.0 (L) 39.0 - 52.0 %   Hemoglobin 11.6 (L) 13.0 - 17.0 g/dL   Patient temperature 16.198.6 F    Collection site RADIAL, ALLEN'S TEST ACCEPTABLE    Drawn by RT    Sample type ARTERIAL   Prepare fresh frozen plasma  Result Value Ref Range   Unit Number [id number]    Blood Component Type THAWED PLASMA    Unit division 00    Status of Unit ALLOCATED    Transfusion Status OK TO TRANSFUSE    Unit Number [id number]    Blood Component Type THAWED PLASMA    Unit division 00    Status of Unit ISSUED    Transfusion Status      OK TO TRANSFUSE Performed at [facility], [address] [place], [place] [address]   [facility]  Result Value Ref Range   Blood Product Unit Number [id number]    Unit Type and Rh 5100    Blood Product Expiration Date [id number]    ISSUE DATE / TIME [id number]    Blood Product Unit Number [id number]    PRODUCT CODE E2720V00    Unit Type and Rh 5100    Blood Product Expiration Date [id number]    Dg Chest Portable 1 View  Result Date: 04/11/2018 CLINICAL DATA:  Follow-up examination status post intubation. EXAM: PORTABLE CHEST 1 VIEW COMPARISON:  Prior radiograph from 08/21/2017 FINDINGS: Distal aspect of an endotracheal tube visualized, with tip well position approximately 2.3 cm above the carina. Enteric tube courses into the abdomen, side hole at the GE junction. Stable cardiomegaly with valvular prosthesis. Mediastinal silhouette grossly unchanged given patient rotation. Lungs mildly hypoinflated. Streaky left basilar  opacity favored to reflect atelectasis. Mild diffuse pulmonary interstitial congestion without overt pulmonary edema. No other focal infiltrates. No definite pleural effusion. No visible pneumothorax, although the lung apices are incompletely visualized on this exam. IMPRESSION: 1. Endotracheal tube in place with tip position 2.3 cm above the carina. 2. Enteric tube in place, side hole at the level of the GE junction. 3. Mild diffuse pulmonary interstitial congestion without overt pulmonary edema. Superimposed streaky left basilar opacity favored to reflect atelectasis. Electronically Signed   By: Rise MuBenjamin  McClintock M.D.   On: 04/11/2018 05:59   Ct Head Code Stroke Wo Contrast  Result Date: 04/11/2018 CLINICAL DATA:  Code stroke. Left-sided droop and altered mental status. EXAM: CT HEAD WITHOUT CONTRAST TECHNIQUE: Contiguous axial images were obtained from the base of the skull through the vertex without intravenous contrast. COMPARISON:  None available FINDINGS: Brain: Bilateral subdural collection, primarily low-density. These measure up to 9 mm in thickness on both sides. Cortical mass effect is minimal. No infarct, hydrocephalus, or masslike finding. Vascular: Atherosclerotic calcification.  No high-density vessel. 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 Other: These results were called by telephone at the time of interpretation on 04/11/2018 at 5:20 am to Dr. Wilford Corner, who verbally acknowledged these results. ASPECTS [facility] Stroke Program Early CT Score) Not scored in the setting of collections. IMPRESSION: 1. Bilateral primarily low-density subdural collection along the cerebral convexities measuring up to 9 mm in thickness. No significant cortical mass effect or midline shift. 2. No evident infarct. Electronically Signed   By: Marnee Spring M.D.   On: 04/11/2018 05:21    EKG EKG Interpretation  Date/Time:  Friday April 11 2018 05:30:03 EST Ventricular Rate:  81 PR  Interval:    QRS Duration: 130 QT Interval:  408 QTC Calculation: 474 R Axis:   83 Text Interpretation:  Sinus rhythm Borderline prolonged PR interval Anterior infarct, old Baseline wander in lead(s) I III aVL Confirmed by Nicanor Alcon, Ayansh Feutz ([id number]) on 04/11/2018 5:46:41 AM   Radiology Ct Head Code Stroke Wo Contrast  Result Date: 04/11/2018 CLINICAL DATA:  Code stroke. Left-sided droop and altered mental status. EXAM: CT HEAD WITHOUT CONTRAST TECHNIQUE: Contiguous axial images were obtained from the base of the skull through the vertex without intravenous contrast. COMPARISON:  None available FINDINGS: Brain: Bilateral subdural collection, primarily low-density. These measure up to 9 mm in thickness on both sides. Cortical mass effect is minimal. No infarct, hydrocephalus, or masslike finding. Vascular: Atherosclerotic calcification.  No high-density vessel. 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 Other: These results were called by telephone at the time of interpretation on 04/11/2018 at 5:20 am to Dr. Wilford Corner, who verbally acknowledged these results. ASPECTS [facility] Stroke Program Early CT Score) Not scored in the setting of collections. IMPRESSION: 1. Bilateral primarily low-density subdural collection along the cerebral convexities measuring up to 9 mm in thickness. No significant cortical mass effect or midline shift. 2. No evident infarct. Electronically Signed   By: Marnee Spring M.D.   On: 04/11/2018 05:21    Procedures Procedure Name: Intubation Date/Time: 04/11/2018 7:09 AM Performed by: Cy Blamer, MD Pre-anesthesia Checklist: Patient identified Oxygen Delivery Method: Non-rebreather mask Preoxygenation: Pre-oxygenation with 100% oxygen Induction Type: Rapid sequence Ventilation: Mask ventilation without difficulty Laryngoscope Size: Glidescope and 4 Grade View: Grade II Tube size: 7.5 mm Number of attempts: 1 Airway Equipment and Method: Patient  positioned with wedge pillow Placement Confirmation: ETT inserted through vocal cords under direct vision,  Positive ETCO2 and Breath sounds checked- equal and bilateral Tube secured with: ETT holder Dental Injury: Teeth and Oropharynx as per pre-operative assessment  Difficulty Due To: Difficulty was anticipated      (including critical care time)  Medications Ordered in ED Medications  sodium chloride flush (NS) 0.9 % injection 3 mL (3 mLs Intravenous Not Given 04/11/18 0545)  levETIRAcetam (KEPPRA) IVPB 500 mg/100 mL premix (has no administration in time range)  fentaNYL in NS (30mcg/ml) infusion-PREMIX (50 mcg/hr Intravenous Rate/Dose Change 04/11/18 0701)  0.9 %  sodium chloride infusion (Manually program via Guardrails IV Fluids) (has  no administration in time range)  levETIRAcetam (KEPPRA) IVPB 1000 mg/100 mL premix (1,000 mg Intravenous Not Given 04/11/18 0633)  propofol (DIPRIVAN) 1000 MG/100ML infusion (15 mcg/kg/min  81.6 kg Intravenous Rate/Dose Change 04/11/18 0702)  pantoprazole (PROTONIX) injection 40 mg (has no administration in time range)  levothyroxine (SYNTHROID, LEVOTHROID) injection 25 mcg (has no administration in time range)  ondansetron (ZOFRAN) injection 4 mg (4 mg Intravenous Given 04/11/18 0534)  etomidate (AMIDATE) injection 20 mg (20 mg Intravenous Given 04/11/18 0535)  rocuronium (ZEMURON) injection 50 mg (50 mg Intravenous Given 04/11/18 0535)  levETIRAcetam (KEPPRA) IVPB 1000 mg/100 mL premix (0 mg Intravenous Stopped 04/11/18 [id number])    Per Dr. Venetia MaxonStern no surgical intervention at this time  MDM Reviewed: previous chart, nursing note and vitals Interpretation: labs, ECG and CT scan (normal creatinine, B SDH on CT by me) Total time providing critical care: 75-105 minutes. This excludes time spent performing separately reportable procedures and services. Consults: critical care and neurosurgery  CRITICAL CARE Performed by: Tamantha Saline K  Alyria Krack-Rasch Total critical care time: 90 minutes Critical care time was exclusive of separately billable procedures and treating other patients. Critical care was necessary to treat or prevent imminent or life-threatening deterioration. Critical care was time spent personally by me on the following activities: development of treatment plan with patient and/or surrogate as well as nursing, discussions with consultants, evaluation of patient's response to treatment, examination of patient, obtaining history from patient or surrogate, ordering and performing treatments and interventions, ordering and review of laboratory studies, ordering and review of radiographic studies, pulse oximetry and re-evaluation of patient's condition. Final Clinical Impressions(s) / ED Diagnoses   Final diagnoses:  SDH (subdural hematoma) (HCC)    Admit to ICU, FFP ordered for reversal of plavix    Dr. Venetia MaxonStern talked directly to Dr. Marlane MingleMohan regarding plan    Latecia Miler, MD 04/11/18 [phone]

## 2018-04-12 ENCOUNTER — Inpatient Hospital Stay (HOSPITAL_COMMUNITY): Payer: Medicare Other

## 2018-04-12 DIAGNOSIS — E440 Moderate protein-calorie malnutrition: Secondary | ICD-10-CM

## 2018-04-12 LAB — CBC
HCT: 34.4 % — ABNORMAL LOW (ref 39.0–52.0)
Hemoglobin: 10.6 g/dL — ABNORMAL LOW (ref 13.0–17.0)
MCH: 21.9 pg — ABNORMAL LOW (ref 26.0–34.0)
MCHC: 30.8 g/dL (ref 30.0–36.0)
MCV: 70.9 fL — AB (ref 80.0–100.0)
Platelets: 165 10*3/uL (ref 150–400)
RBC: 4.85 MIL/uL (ref 4.22–5.81)
RDW: 16.4 % — ABNORMAL HIGH (ref 11.5–15.5)
WBC: 11.9 10*3/uL — ABNORMAL HIGH (ref 4.0–10.5)
nRBC: 0 % (ref 0.0–0.2)

## 2018-04-12 LAB — POCT I-STAT 7, (LYTES, BLD GAS, ICA,H+H)
Acid-base deficit: 1 mmol/L (ref 0.0–2.0)
BICARBONATE: 20.3 mmol/L (ref 20.0–28.0)
Calcium, Ion: 1.18 mmol/L (ref 1.15–1.40)
HEMATOCRIT: 30 % — AB (ref 39.0–52.0)
Hemoglobin: 10.2 g/dL — ABNORMAL LOW (ref 13.0–17.0)
O2 Saturation: 99 %
Patient temperature: 99
Potassium: 3.7 mmol/L (ref 3.5–5.1)
Sodium: 138 mmol/L (ref 135–145)
TCO2: 21 mmol/L — ABNORMAL LOW (ref 22–32)
pCO2 arterial: 22.3 mmHg — ABNORMAL LOW (ref 32.0–48.0)
pH, Arterial: 7.568 — ABNORMAL HIGH (ref 7.350–7.450)
pO2, Arterial: 102 mmHg (ref 83.0–108.0)

## 2018-04-12 LAB — PREPARE FRESH FROZEN PLASMA
Unit division: 0
Unit division: 0

## 2018-04-12 LAB — PHOSPHORUS: Phosphorus: 2.5 mg/dL (ref 2.5–4.6)

## 2018-04-12 LAB — BPAM FFP
Blood Product Expiration Date: 202002142359
Blood Product Expiration Date: 202002142359
ISSUE DATE / TIME: 202002140649
ISSUE DATE / TIME: 202002141053
Unit Type and Rh: 5100
Unit Type and Rh: 5100

## 2018-04-12 LAB — BASIC METABOLIC PANEL
Anion gap: 9 (ref 5–15)
BUN: 10 mg/dL (ref 8–23)
CO2: 19 mmol/L — ABNORMAL LOW (ref 22–32)
Calcium: 8.8 mg/dL — ABNORMAL LOW (ref 8.9–10.3)
Chloride: 110 mmol/L (ref 98–111)
Creatinine, Ser: 1.24 mg/dL (ref 0.61–1.24)
GFR calc Af Amer: 59 mL/min — ABNORMAL LOW (ref >60)
GFR calc non Af Amer: 51 mL/min — ABNORMAL LOW (ref >60)
Glucose, Bld: 88 mg/dL (ref 70–99)
Potassium: 4.1 mmol/L (ref 3.5–5.1)
Sodium: 138 mmol/L (ref 135–145)

## 2018-04-12 LAB — MAGNESIUM: MAGNESIUM: 1.9 mg/dL (ref 1.7–2.4)

## 2018-04-12 MED ORDER — VITAL HIGH PROTEIN PO LIQD: 1000.0000 mL | ORAL | Status: DC

## 2018-04-12 MED ORDER — VITAL AF 1.2 CAL PO LIQD
1000.0000 mL | ORAL | Status: DC
  Administered 2018-04-12 – 2018-04-13 (×2): 1000 mL

## 2018-04-12 MED ORDER — PRO-STAT SUGAR FREE PO LIQD: 30.0000 mL | Freq: Every day | ORAL | Status: DC

## 2018-04-12 MED ORDER — PRO-STAT SUGAR FREE PO LIQD
30.0000 mL | Freq: Every day | ORAL | Status: DC
  Administered 2018-04-12 – 2018-04-13 (×2): 30 mL
  Filled 2018-04-12: qty 30

## 2018-04-12 MED ORDER — PRO-STAT SUGAR FREE PO LIQD
30.0000 mL | Freq: Two times a day (BID) | ORAL | Status: DC
  Filled 2018-04-12: qty 30

## 2018-04-12 NOTE — Progress Notes (Signed)
Interim Note  Reviewed EEG : negative for subclinical seizures.  No epileptiform activity noted on EEG.   Continue Keppra Seizure precautions  Neurology is available as needed.  Please call back there is a concern for further seizures or any questions.

## 2018-04-12 NOTE — Progress Notes (Signed)
EEG completed; results pending.    

## 2018-04-12 NOTE — Progress Notes (Signed)
Transported pt to CT and back to 4N24 with RN without any complications.

## 2018-04-12 NOTE — Procedures (Signed)
History: 85 yo M with subdural hematomas  Sedation: Propofol 10 mcg/kg/min  Technique: This is a 21 channel routine scalp EEG performed at the bedside with bipolar and monopolar montages arranged in accordance to the international 10/20 system of electrode placement. One channel was dedicated to EKG recording.    Background: There is a background frequency of 10 Hz seen during the brief waking state, as well as some anteriorly predominant beta activity. In addition, there is generalized irregular delta and theta even during maximal wakefulness. Sleep is recorded     Photic stimulation: Physiologic driving is not performed  EEG Abnormalities: 1) Generalized irregular slow activity  Clinical Interpretation: This EEGis consistent with a generalized non-specific cerebral dysfunction(encephalopathy). There was no seizure or seizure predisposition recorded on this study. Please note that lack of epileptiform activity on EEG does not preclude the possibility of epilepsy.   Ritta Slot, MD [facility] [phone]  If 7pm- 7am, please page neurology on call as listed in AMION.

## 2018-04-12 NOTE — Progress Notes (Addendum)
NAME:  Charles Patrick, MRN:  [id number], DOB:  [birth year], LOS: 1 ADMISSION DATE:  04/11/2018, CONSULTATION DATE: February 14 REFERRING MD: Dr. Nicanor Alcon, CHIEF COMPLAINT: Altered mental status, subdural hematoma  Brief History   85 year old male unresponsive at home with seizure history also found to have bilateral acute/subacute subdural hematoma.  Intubated in the emergency department for airway protection, admitted to ICU.  NSGY felt no surgical intervention required.      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  Significant Hospital Events   2/14 Admit with AMS, SDH 2/15 Failed SBT with apnea  Consults:  Neurology Neurosurgery  Procedures:  ETT 2/14 >  Significant Diagnostic Tests:  CT head 2/14 > Bilateral primarily low-density subdural collection along the cerebral convexities measuring up to 9 mm in thickness.  Micro Data:    Antimicrobials:     Interim history/subjective:  RN reports pt follows intermittent commands.  EEG in progress. Afebrile. RT reports pt failed SBT with apnea (?if on sedation during wean).   Objective   Blood pressure 102/79, pulse 72, temperature 98.4 F (36.9 C), temperature source Axillary, resp. rate 15, height 6\' 2"  (1.88 m), weight 76.5 kg, SpO2 100 %.    Vent Mode: PRVC FiO2 (%):  [30 %] 30 % Set Rate:  [15 bmp] 15 bmp Vt Set:  [650 mL] 650 mL PEEP:  [5 cmH20] 5 cmH20 Plateau Pressure:  [14 cmH20-20 cmH20] 15 cmH20   Intake/Output Summary (Last  24 hours) at 04/12/2018 3612 Last data filed at 04/12/2018 0800 Gross per 24 hour  Intake 781.39 ml  Output 1205 ml  Net -423.61 ml   Filed Weights   04/11/18 0629 04/12/18 0500  Weight: 81.6 kg 76.5 kg    Examination: General: elderly male lying in bed on vent in NAD HEENT: MM pink/moist, ETT Neuro: sedate, moves spontaneously but no follow commands CV: s1s2 rrr, no m/r/g PULM: even/non-labored, lungs bilaterally coarse with rhonchi  AE:SLPN, non-tender, bsx4 active  Extremities: warm/dry, no edema, R BKA  Skin: no rashes or lesions  Resolved Hospital Problem list     Assessment & Plan:   Bilateral subdural hematoma -on plavix as an outpatient. P: NSGY rec's > no plans for surgical intervention at this tim  Follow frequent neuro exam Neurology following for possible seizure   Acute encephalopathy - in setting of bilateral SDH, but cannot rule out status epilepticus or post ictal as he has a history of seizure disorder, although no home meds listed for this.  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   Disposition: ICU  Labs   CBC: Recent Labs  Lab 04/11/18 0515 04/11/18 0555 04/12/18 0520  WBC 7.3   --   --   NEUTROABS 4.1  --   --   HGB 10.3* 11.6* 10.2*  HCT 35.4* 34.0* 30.0*  MCV 72.5*  --   --   PLT 182  --   --     Basic Metabolic Panel: Recent Labs  Lab 04/11/18 0515 04/11/18 0520 04/11/18 0555 04/12/18 0520 04/12/18 0723  NA 140  --  141 138 138  K 3.5  --  3.4* 3.7 4.1  CL 110  --   --   --  110  CO2 22  --   --   --  19*  GLUCOSE 116*  --   --   --  88  BUN 11  --   --   --  10  CREATININE 1.04 0.90  --   --  1.24  CALCIUM 8.5*  --   --   --  8.8*  MG  --   --   --   --  1.9  PHOS  --   --   --   --  2.5   GFR: Estimated Creatinine Clearance: 42 mL/min (by C-G formula based on SCr of 1.24 mg/dL). Recent Labs  Lab 04/11/18 0515  WBC 7.3    Liver Function Tests: Recent Labs  Lab 04/11/18 0515  AST 16  ALT 10  ALKPHOS 57  BILITOT 0.4  PROT 6.4*  ALBUMIN 3.2*   No results for input(s): LIPASE, AMYLASE in the last 168 hours. No results for input(s): AMMONIA in the last 168 hours.  ABG    Component Value Date/Time   PHART 7.568 (H) 04/12/2018 0520   PCO2ART 22.3 (L) 04/12/2018 0520   PO2ART 102.0 04/12/2018 0520   HCO3 20.3 04/12/2018 0520   TCO2 21 (L) 04/12/2018 0520   ACIDBASEDEF 1.0 04/12/2018 0520   O2SAT 99.0 04/12/2018 0520     Coagulation Profile: Recent Labs  Lab 04/11/18 0515  INR 1.12    Cardiac Enzymes: No results for input(s): CKTOTAL, CKMB, CKMBINDEX, TROPONINI in the last 168 hours.  HbA1C: Hgb A1c MFr Bld  Date/Time Value Ref Range Status  01/18/2017 11:32 AM 5.6 4.8 - 5.6 % Final    Comment:    (NOTE) Pre diabetes:          5.7%-6.4% Diabetes:              >6.4% Glycemic control for   <7.0% adults with diabetes     CBG: Recent Labs  Lab 04/11/18 0551  GLUCAP 104*    Critical care time: 30 minutes     Canary Brim, NP-C [facility] Pgr: [phone] or if no answer [phone] 04/12/2018, 9:24 AM

## 2018-04-12 NOTE — Progress Notes (Signed)
Subjective: The patient is sedated.  He is easily aroused.  He is at times agitated.  Objective: Vital signs in last 24 hours: Temp:  [96.7 F (35.9 C)-99.1 F (37.3 C)] 98.4 F (36.9 C) (02/15 0800) Pulse Rate:  [50-70] 58 (02/15 0735) Resp:  [13-23] 15 (02/15 0735) BP: (73-123)/(58-92) 95/74 (02/15 0735) SpO2:  [97 %-100 %] 98 % (02/15 0735) FiO2 (%):  [30 %] 30 % (02/15 0735) Weight:  [76.5 kg] 76.5 kg (02/15 0500) Estimated body mass index is 21.65 kg/m as calculated from the following:   Height as of this encounter: 6\' 2"  (1.88 m).   Weight as of this encounter: 76.5 kg.   Intake/Output from previous day: 02/14 0701 - 02/15 0700 In: 1033.1 [I.V.:319.1; Blood:614; IV Piggyback:100] Out: 1150 [Urine:1140; Emesis/NG output:10] Intake/Output this shift: No intake/output data recorded.  Physical exam Glasgow Coma Scale 10 intubated, [id number].  His right pupil is opaque, his left pupil is small.  He moves all 4 extremities and seems to follow commands.  I reviewed the patient's follow-up head CT performed today.  It demonstrates a small bilateral subdural hematomas without significant mass-effect, unchanged from his previous CT scan.  Lab Results: Recent Labs    04/11/18 0515 04/11/18 0555 04/12/18 0520  WBC 7.3  --   --   HGB 10.3* 11.6* 10.2*  HCT 35.4* 34.0* 30.0*  PLT 182  --   --    BMET Recent Labs    04/11/18 0515 04/11/18 0520 04/11/18 0555 04/12/18 0520  NA 140  --  141 138  K 3.5  --  3.4* 3.7  CL 110  --   --   --   CO2 22  --   --   --   GLUCOSE 116*  --   --   --   BUN 11  --   --   --   CREATININE 1.04 0.90  --   --   CALCIUM 8.5*  --   --   --     Studies/Results: Ct Head Wo Contrast  Result Date: 04/12/2018 CLINICAL DATA:  Followup subdural hematomas. EXAM: CT HEAD WITHOUT CONTRAST TECHNIQUE: Contiguous axial images were obtained from the base of the skull through the vertex without intravenous contrast. COMPARISON:  04/11/2018 FINDINGS:  Brain: No worsening. Bilateral convexity subdural hematomas low to intermediate density are the same or perhaps minimally smaller, measured in the same locations as the previous study. Thickness on the right is 8 mm. Thickness on the left is 8 mm. Minimal mass-effect upon the brain. Because of the symmetry, there is no midline shift. No sign of acute infarction, intraparenchymal brain hemorrhage, hydrocephalus or extra-axial collection. Mild chronic small-vessel changes of the white matter as seen previously. Vascular: There is atherosclerotic calcification of the major vessels at the base of the brain. Skull: Normal Sinuses/Orbits: Chronic mucoperiosteal changes of the frontal sinuses and maxillary sinuses. No acute sinusitis. 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 Other: None IMPRESSION: No worsening or progressive change. Bilateral convexity low to intermediate density subdural hematomas of the same when considering measuring differences, thickness approximately 8 mm. No midline shift. Electronically Signed   By: Paulina Fusi M.D.   On: 04/12/2018 06:44   Dg Chest Port 1 View  Result Date: 04/12/2018 CLINICAL DATA:  Ventilator dependent respiratory failure. Follow-up LEFT basilar atelectasis and/or pneumonia. EXAM: PORTABLE CHEST 1 VIEW COMPARISON:  04/11/2018 and earlier. FINDINGS: Endotracheal tube tip in satisfactory position projecting approximately 7 cm above the  carina. Nasogastric tube courses below the diaphragm into the stomach. Prior TAVR. Cardiac silhouette mildly enlarged, unchanged. Thoracic aorta tortuous and ectatic, unchanged. Streaky opacities at the LEFT lung base are unchanged since yesterday. Lungs remain clear otherwise. Pulmonary vascularity normal without evidence of pulmonary edema. Examinations IMPRESSION: 1.  Support apparatus satisfactory. 2. Stable minimal LEFT basilar atelectasis. No acute cardiopulmonary disease otherwise. Electronically Signed   By: Hulan Saas  M.D.   On: 04/12/2018 08:28   Dg Chest Portable 1 View  Result Date: 04/11/2018 CLINICAL DATA:  Follow-up examination status post intubation. EXAM: PORTABLE CHEST 1 VIEW COMPARISON:  Prior radiograph from 08/21/2017 FINDINGS: Distal aspect of an endotracheal tube visualized, with tip well position approximately 2.3 cm above the carina. Enteric tube courses into the abdomen, side hole at the GE junction. Stable cardiomegaly with valvular prosthesis. Mediastinal silhouette grossly unchanged given patient rotation. Lungs mildly hypoinflated. Streaky left basilar opacity favored to reflect atelectasis. Mild diffuse pulmonary interstitial congestion without overt pulmonary edema. No other focal infiltrates. No definite pleural effusion. No visible pneumothorax, although the lung apices are incompletely visualized on this exam. IMPRESSION: 1. Endotracheal tube in place with tip position 2.3 cm above the carina. 2. Enteric tube in place, side hole at the level of the GE junction. 3. Mild diffuse pulmonary interstitial congestion without overt pulmonary edema. Superimposed streaky left basilar opacity favored to reflect atelectasis. Electronically Signed   By: Rise Mu M.D.   On: 04/11/2018 05:59   Ct Head Code Stroke Wo Contrast  Result Date: 04/11/2018 CLINICAL DATA:  Code stroke. Left-sided droop and altered mental status. EXAM: CT HEAD WITHOUT CONTRAST TECHNIQUE: Contiguous axial images were obtained from the base of the skull through the vertex without intravenous contrast. COMPARISON:  None available FINDINGS: Brain: Bilateral subdural collection, primarily low-density. These measure up to 9 mm in thickness on both sides. Cortical mass effect is minimal. No infarct, hydrocephalus, or masslike finding. Vascular: Atherosclerotic calcification.  No high-density vessel. 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 Other: These results were called by telephone at the time of  interpretation on 04/11/2018 at 5:20 am to Dr. Wilford Corner, who verbally acknowledged these results. ASPECTS [facility] Stroke Program Early CT Score) Not scored in the setting of collections. IMPRESSION: 1. Bilateral primarily low-density subdural collection along the cerebral convexities measuring up to 9 mm in thickness. No significant cortical mass effect or midline shift. 2. No evident infarct. Electronically Signed   By: Marnee Spring M.D.   On: 04/11/2018 05:21    Assessment/Plan: Bilateral subdural hematomas: These are small do not require intervention.  Depending on how aggressive the family wants to be perhaps we will repeat his CAT scan in a few days.  LOS: 1 day     Cristi Loron 04/12/2018, 8:36 AM

## 2018-04-12 NOTE — Progress Notes (Addendum)
Nutrition Follow-up  DOCUMENTATION CODES:  Non-severe (moderate) malnutrition in context of chronic illness  INTERVENTION:  Initiate TF:  Vital AF 1.2  at goal rate of 55 ml/h (1320 ml per day) and Prostat 30 ml q24 hrs to provide 1813 kcals, 114 gm protein, 1071 ml free water daily.  NUTRITION DIAGNOSIS:  Moderate Malnutrition related to chronic illness as evidenced by mild fat depletion, moderate fat depletion, moderate muscle depletion, severe muscle depletion.  GOAL:  Provide needs based on ASPEN/SCCM guidelines  MONITOR:  I & O's, Vent status, TF tolerance, BMs,   REASON FOR ASSESSMENT:  Consult Enteral/tube feeding initiation and management  ASSESSMENT:  Pt with PMH of seizure disorder, CAD, gastroparesis, HTN, Iron deficiency, pulmonary HTN, s/p TAVR, vitamin D deficiency, hypercholesterolemia and recent R BKA admitted from home after being found down with bilateral acute/subacute SDH.   RD consulted to start TF. Pt assessed by RD yesterday afternoon. Today, Propofol Rate the same. Tmax/mve slightly higher. Slight adjustment mde to recs from yesterday. Noted moderate abdominal distension. Has not had documented BM since arrival  Patient is currently intubated on ventilator support MV: 10.1 L/min Temp (24hrs), Avg:98.8 F (37.1 C), Min:98.3 F (36.8 C), Max:99.5 F (37.5 C) Propofol: 4.81ml/hr = 129 kcals  Labs: Wbc: 11.9, electrolytes wdl Meds:ppi  Sedation/analgesia: Propofol, fentanyl Infusions: Keppra  Recent Labs  Lab 04/11/18 0515 04/11/18 0520 04/11/18 0555 04/12/18 0520 04/12/18 0723  NA 140  --  141 138 138  K 3.5  --  3.4* 3.7 4.1  CL 110  --   --   --  110  CO2 22  --   --   --  19*  BUN 11  --   --   --  10  CREATININE 1.04 0.90  --   --  1.24  CALCIUM 8.5*  --   --   --  8.8*  MG  --   --   --   --  1.9  PHOS  --   --   --   --  2.5  GLUCOSE 116*  --   --   --  88    Diet Order:   Diet Order            Diet NPO time specified  Diet  effective now             EDUCATION NEEDS:  No education needs have been identified at this time  Skin:  Skin Assessment: Reviewed RN Assessment  Last BM:  unknown  Height:  Ht Readings from Last 1 Encounters:  04/11/18 6\' 2"  (1.88 m)   Weight:  Wt Readings from Last 1 Encounters:  04/12/18 76.5 kg   Wt Readings from Last 10 Encounters:  04/12/18 76.5 kg  08/14/17 77.1 kg  02/13/17 78.7 kg  01/30/17 79.4 kg  01/24/17 78.5 kg  01/18/17 80.8 kg  01/15/17 80.3 kg  01/11/17 80.3 kg  01/07/17 80.3 kg  11/05/16 80.3 kg   Ideal Body Weight:  80.8 kg  BMI:  Body mass index is 21.65 kg/m.  Estimated Nutritional Needs:  Kcal:  1800-(PSU 2003 b)  Protein:  107-122g Pro (1.4-1.6g/kg bw) Fluid:  > 1.7 L/day  Christophe Louis RD, LDN, CNSC Clinical Nutrition Available Tues-Sat via Pager: [phone] 04/12/2018 3:57 PM

## 2018-04-13 ENCOUNTER — Inpatient Hospital Stay (HOSPITAL_COMMUNITY): Payer: Medicare Other

## 2018-04-13 LAB — CBC
HEMATOCRIT: 34.1 % — AB (ref 39.0–52.0)
Hemoglobin: 10.4 g/dL — ABNORMAL LOW (ref 13.0–17.0)
MCH: 21.6 pg — ABNORMAL LOW (ref 26.0–34.0)
MCHC: 30.5 g/dL (ref 30.0–36.0)
MCV: 70.7 fL — ABNORMAL LOW (ref 80.0–100.0)
Platelets: 177 10*3/uL (ref 150–400)
RBC: 4.82 MIL/uL (ref 4.22–5.81)
RDW: 16.3 % — AB (ref 11.5–15.5)
WBC: 15.2 10*3/uL — ABNORMAL HIGH (ref 4.0–10.5)
nRBC: 0 % (ref 0.0–0.2)

## 2018-04-13 LAB — BASIC METABOLIC PANEL
Anion gap: 9 (ref 5–15)
BUN: 12 mg/dL (ref 8–23)
CO2: 22 mmol/L (ref 22–32)
Calcium: 8.5 mg/dL — ABNORMAL LOW (ref 8.9–10.3)
Chloride: 106 mmol/L (ref 98–111)
Creatinine, Ser: 1.03 mg/dL (ref 0.61–1.24)
GFR calc Af Amer: >60 mL/min (ref >60)
GFR calc non Af Amer: >60 mL/min (ref >60)
Glucose, Bld: 118 mg/dL — ABNORMAL HIGH (ref 70–99)
Potassium: 3.4 mmol/L — ABNORMAL LOW (ref 3.5–5.1)
Sodium: 137 mmol/L (ref 135–145)

## 2018-04-13 LAB — GLUCOSE, CAPILLARY
GLUCOSE-CAPILLARY: 106 mg/dL — AB (ref 70–99)
Glucose-Capillary: 103 mg/dL — ABNORMAL HIGH (ref 70–99)
Glucose-Capillary: 126 mg/dL — ABNORMAL HIGH (ref 70–99)
Glucose-Capillary: 138 mg/dL — ABNORMAL HIGH (ref 70–99)
Glucose-Capillary: 96 mg/dL (ref 70–99)

## 2018-04-13 LAB — PHOSPHORUS: Phosphorus: 3.7 mg/dL (ref 2.5–4.6)

## 2018-04-13 LAB — MAGNESIUM: Magnesium: 2.1 mg/dL (ref 1.7–2.4)

## 2018-04-13 MED ORDER — LEVOTHYROXINE SODIUM 50 MCG PO TABS
50.0000 ug | ORAL_TABLET | Freq: Every day | ORAL | Status: DC
  Administered 2018-04-13 – 2018-04-14 (×2): 50 ug
  Filled 2018-04-13 (×2): qty 1

## 2018-04-13 MED ORDER — ACETAMINOPHEN 325 MG PO TABS: 650.0000 mg | ORAL_TABLET | Freq: Four times a day (QID) | ORAL | Status: DC | PRN

## 2018-04-13 MED ORDER — POTASSIUM CHLORIDE 20 MEQ/15ML (10%) PO SOLN
20.0000 meq | Freq: Once | ORAL | Status: AC
  Administered 2018-04-13: 20 meq
  Filled 2018-04-13: qty 15

## 2018-04-13 NOTE — Progress Notes (Signed)
NAME:  Charles Patrick, MRN:  [id number], DOB:  09/10/1924, LOS: 2 ADMISSION DATE:  04/11/2018, CONSULTATION DATE: February 14 REFERRING MD: Dr. Nicanor Alcon, CHIEF COMPLAINT: Altered mental status, subdural hematoma  Brief History   85 year old male unresponsive at home with seizure history also found to have bilateral acute/subacute subdural hematoma.  Intubated in the emergency department for airway protection, admitted to ICU.  NSGY felt no surgical intervention required.      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  Significant Hospital Events   2/14 Admit with AMS, SDH 2/15 Failed SBT with apnea  Consults:  Neurology Neurosurgery  Procedures:  ETT 2/14 >  Significant Diagnostic Tests:  CT head 2/14 > Bilateral primarily low-density subdural collection along the cerebral convexities measuring up to 9 mm in thickness. EEG 2/15 >> negative   Micro Data:    Antimicrobials:     Interim history/subjective:  EEG completed.  No acute events overnight.  Remains on low dose propofol.  Fentanyl gtt off.   Objective   Blood pressure 121/80, pulse 76, temperature 99.5 F (37.5 C), temperature source Axillary, resp. rate 14, height 6\' 2"  (1.88 m), weight 76.5 kg, SpO2 99 %.    Vent Mode: PRVC FiO2 (%):  [30 %] 30 % Set Rate:  [14 bmp-15 bmp] 14 bmp Vt Set:  [650 mL] 650 mL PEEP:  [5 cmH20] 5 cmH20 Plateau Pressure:  [11 cmH20-24 cmH20] 18 cmH20   Intake/Output Summary (Last 24 hours) at  04/13/2018 8756 Last data filed at 04/13/2018 0700 Gross per 24 hour  Intake 1201.18 ml  Output 855 ml  Net 346.18 ml   Filed Weights   04/11/18 0629 04/12/18 0500  Weight: 81.6 kg 76.5 kg    Examination: General: elderly male lying in bed on vent  HEENT: MM pink/moist, ETT Neuro: Awakens to voice, nods yes/no, shifts in bed / MAE CV: s1s2 rrr, no m/r/g PULM: even/non-labored, lungs bilaterally coarse  EP:PIRJ, non-tender, bsx4 active  Extremities: warm/dry, no edema, R BKA  Skin: no rashes or lesions  Resolved Hospital Problem list     Assessment & Plan:   Bilateral subdural hematoma -on plavix as an outpatient. P: NSGY with no further plan for surgical intervention at this time  Follow neuro exam  PT efforts   Acute encephalopathy - in setting of bilateral SDH, but cannot rule out status epilepticus or post ictal as he has a history of seizure disorder, although no home meds listed for this.  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7*  PLT 182  --   --  165 177    Basic Metabolic Panel: Recent Labs  Lab 04/11/18 0515 04/11/18 0520 04/11/18 0555 04/12/18 0520 04/12/18 0723 04/13/18 0414  NA 140  --  141 138 138 137  K 3.5  --  3.4* 3.7 4.1 3.4*  CL 110  --   --   --  110 106  CO2 22  --   --   --  19* 22  GLUCOSE 116*  --   --   --  88 118*  BUN 11  --   --   --  10 12  CREATININE 1.04 0.90  --   --  1.24 1.03  CALCIUM 8.5*  --   --   --  8.8* 8.5*  MG  --   --   --   --  1.9 2.1  PHOS  --   --   --   --  2.5 3.7   GFR: Estimated Creatinine Clearance: 50.5 mL/min (by C-G formula based on SCr of 1.03 mg/dL). Recent Labs  Lab 04/11/18 0515 04/12/18 1127 04/13/18 0414  WBC 7.3 11.9* 15.2*    Liver Function Tests: Recent Labs  Lab 04/11/18 0515  AST 16  ALT 10  ALKPHOS 57  BILITOT 0.4  PROT 6.4*  ALBUMIN 3.2*   No results for input(s): LIPASE, AMYLASE in the last 168 hours. No results for input(s): AMMONIA in the last 168 hours.  ABG    Component Value Date/Time   PHART 7.568 (H) 04/12/2018 0520   PCO2ART 22.3 (L) 04/12/2018 0520   PO2ART 102.0 04/12/2018 0520   HCO3 20.3 04/12/2018 0520   TCO2 21 (L) 04/12/2018 0520   ACIDBASEDEF 1.0 04/12/2018 0520   O2SAT 99.0 04/12/2018 0520     Coagulation Profile: Recent Labs  Lab 04/11/18 0515  INR 1.12    Cardiac Enzymes: No results for input(s): CKTOTAL, CKMB, CKMBINDEX, TROPONINI in the last 168 hours.  HbA1C: Hgb A1c MFr Bld  Date/Time Value Ref Range Status  01/18/2017 11:32 AM 5.6 4.8 - 5.6 % Final    Comment:    (NOTE) Pre diabetes:          5.7%-6.4% Diabetes:              >6.4% Glycemic control for   <7.0% adults with diabetes     CBG: Recent Labs  Lab 04/11/18 0551  GLUCAP 104*    Critical care time:     Canary Brim, NP-C [facility] Pgr: [phone] or if no answer [phone] 04/13/2018, 8:22  AM

## 2018-04-13 NOTE — Progress Notes (Signed)
eLink Physician-Brief Progress Note Patient Name: Charles Patrick DOB: Nov 06, 1924 MRN: [id number]   Date of Service  04/13/2018  HPI/Events of Note  A 85 year old male intubated in the received phone call from the bedside team to evaluate for the ETT position.   eICU Interventions  Placed in nursing communication order "ET tube right at the tip of carina.  Recommend to pull out the ET tube by 2 cm.  No need to repeat the chest x-ray. "     Intervention Category Intermediate Interventions: Diagnostic test evaluation;Respiratory distress - evaluation and management  Glory Rosebush 04/13/2018, 8:07 PM

## 2018-04-13 NOTE — Progress Notes (Signed)
Subjective: The patient is alert and responsive.  He is intubated.  He has had no apparent distress.  Objective: Vital signs in last 24 hours: Temp:  [98.1 F (36.7 C)-100.1 F (37.8 C)] 99.5 F (37.5 C) (02/16 0800) Pulse Rate:  [61-124] 76 (02/16 0800) Resp:  [13-30] 14 (02/16 0800) BP: (80-121)/(64-86) 121/80 (02/16 0800) SpO2:  [92 %-100 %] 99 % (02/16 0800) FiO2 (%):  [30 %] 30 % (02/16 0518) Estimated body mass index is 21.65 kg/m as calculated from the following:   Height as of this encounter: 6\' 2"  (1.88 m).   Weight as of this encounter: 76.5 kg.   Intake/Output from previous day: 02/15 0701 - 02/16 0700 In: 1223.4 [I.V.:192.7; NG/GT:820.4; IV Piggyback:200.3] Out: 910 [Urine:910] Intake/Output this shift: Total I/O In: 5.3 [I.V.:5.3] Out: 100 [Urine:100]  Physical exam the patient is alert and attentive.  He localizes bilaterally.  His right pupil is opaque without change.  Lab Results: Recent Labs    04/12/18 1127 04/13/18 0414  WBC 11.9* 15.2*  HGB 10.6* 10.4*  HCT 34.4* 34.1*  PLT 165 177   BMET Recent Labs    04/12/18 0723 04/13/18 0414  NA 138 137  K 4.1 3.4*  CL 110 106  CO2 19* 22  GLUCOSE 88 118*  BUN 10 12  CREATININE 1.24 1.03  CALCIUM 8.8* 8.5*    Studies/Results: Ct Head Wo Contrast  Result Date: 04/12/2018 CLINICAL DATA:  Followup subdural hematomas. EXAM: CT HEAD WITHOUT CONTRAST TECHNIQUE: Contiguous axial images were obtained from the base of the skull through the vertex without intravenous contrast. COMPARISON:  04/11/2018 FINDINGS: Brain: No worsening. Bilateral convexity subdural hematomas low to intermediate density are the same or perhaps minimally smaller, measured in the same locations as the previous study. Thickness on the right is 8 mm. Thickness on the left is 8 mm. Minimal mass-effect upon the brain. Because of the symmetry, there is no midline shift. No sign of acute infarction, intraparenchymal brain hemorrhage,  hydrocephalus or extra-axial collection. Mild chronic small-vessel changes of the white matter as seen previously. Vascular: There is atherosclerotic calcification of the major vessels at the base of the brain. Skull: Normal Sinuses/Orbits: Chronic mucoperiosteal changes of the frontal sinuses and maxillary sinuses. No acute sinusitis. 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 Other: None IMPRESSION: No worsening or progressive change. Bilateral convexity low to intermediate density subdural hematomas of the same when considering measuring differences, thickness approximately 8 mm. No midline shift. Electronically Signed   By: Paulina Fusi M.D.   On: 04/12/2018 06:44   Dg Chest Port 1 View  Result Date: 04/13/2018 CLINICAL DATA:  Hypoxia EXAM: PORTABLE CHEST 1 VIEW COMPARISON:  April 12, 2018 FINDINGS: Endotracheal tube tip is 7.3 cm above the carina. Nasogastric tube tip is at the gastroesophageal junction with the side port above the gastroesophageal junction. No pneumothorax. There is atelectatic change in the left base with minimal left pleural effusion. Lungs elsewhere clear. Heart is upper normal in size with pulmonary vascularity normal. There is a prosthetic aortic valve. Aorta is somewhat tortuous but stable. No bone lesions. No adenopathy. IMPRESSION: Tube positions as described without pneumothorax. Nasogastric tube tip is at the gastroesophageal junction. Advise advancing nasogastric tube approximately 10 cm. Left base atelectasis with minimal left pleural effusion. No frank edema or consolidation. Stable cardiac silhouette. Electronically Signed   By: Bretta Bang III M.D.   On: 04/13/2018 07:02   Dg Chest Port 1 View  Result Date: 04/12/2018  CLINICAL DATA:  Ventilator dependent respiratory failure. Follow-up LEFT basilar atelectasis and/or pneumonia. EXAM: PORTABLE CHEST 1 VIEW COMPARISON:  04/11/2018 and earlier. FINDINGS: Endotracheal tube tip in satisfactory position  projecting approximately 7 cm above the carina. Nasogastric tube courses below the diaphragm into the stomach. Prior TAVR. Cardiac silhouette mildly enlarged, unchanged. Thoracic aorta tortuous and ectatic, unchanged. Streaky opacities at the LEFT lung base are unchanged since yesterday. Lungs remain clear otherwise. Pulmonary vascularity normal without evidence of pulmonary edema. Examinations IMPRESSION: 1.  Support apparatus satisfactory. 2. Stable minimal LEFT basilar atelectasis. No acute cardiopulmonary disease otherwise. Electronically Signed   By: Hulan Saas M.D.   On: 04/12/2018 08:28    Assessment/Plan: Bilateral subdural hematomas: These are small and likely asymptomatic.  He does not need surgery and likely is not a surgical candidate.  LOS: 2 days     Cristi Loron 04/13/2018, 8:42 AM

## 2018-04-14 ENCOUNTER — Inpatient Hospital Stay (HOSPITAL_COMMUNITY): Payer: Medicare Other

## 2018-04-14 LAB — HEPATIC FUNCTION PANEL
ALT: 13 U/L (ref 0–44)
AST: 23 U/L (ref 15–41)
Albumin: 2.9 g/dL — ABNORMAL LOW (ref 3.5–5.0)
Alkaline Phosphatase: 67 U/L (ref 38–126)
Bilirubin, Direct: 0.3 mg/dL — ABNORMAL HIGH (ref 0.0–0.2)
Indirect Bilirubin: 0.6 mg/dL (ref 0.3–0.9)
Total Bilirubin: 0.9 mg/dL (ref 0.3–1.2)
Total Protein: 7 g/dL (ref 6.5–8.1)

## 2018-04-14 LAB — CBC
HEMATOCRIT: 35.3 % — AB (ref 39.0–52.0)
Hemoglobin: 10.8 g/dL — ABNORMAL LOW (ref 13.0–17.0)
MCH: 21.6 pg — ABNORMAL LOW (ref 26.0–34.0)
MCHC: 30.6 g/dL (ref 30.0–36.0)
MCV: 70.5 fL — ABNORMAL LOW (ref 80.0–100.0)
Platelets: 146 10*3/uL — ABNORMAL LOW (ref 150–400)
RBC: 5.01 MIL/uL (ref 4.22–5.81)
RDW: 16 % — ABNORMAL HIGH (ref 11.5–15.5)
WBC: 15.5 10*3/uL — ABNORMAL HIGH (ref 4.0–10.5)
nRBC: 0 % (ref 0.0–0.2)

## 2018-04-14 LAB — GLUCOSE, CAPILLARY
Glucose-Capillary: 106 mg/dL — ABNORMAL HIGH (ref 70–99)
Glucose-Capillary: 111 mg/dL — ABNORMAL HIGH (ref 70–99)
Glucose-Capillary: 125 mg/dL — ABNORMAL HIGH (ref 70–99)
Glucose-Capillary: 125 mg/dL — ABNORMAL HIGH (ref 70–99)
Glucose-Capillary: 127 mg/dL — ABNORMAL HIGH (ref 70–99)
Glucose-Capillary: 161 mg/dL — ABNORMAL HIGH (ref 70–99)

## 2018-04-14 LAB — BASIC METABOLIC PANEL
Anion gap: 11 (ref 5–15)
BUN: 18 mg/dL (ref 8–23)
CO2: 20 mmol/L — ABNORMAL LOW (ref 22–32)
CREATININE: 1.12 mg/dL (ref 0.61–1.24)
Calcium: 8.3 mg/dL — ABNORMAL LOW (ref 8.9–10.3)
Chloride: 104 mmol/L (ref 98–111)
GFR calc Af Amer: >60 mL/min (ref >60)
GFR calc non Af Amer: 57 mL/min — ABNORMAL LOW (ref >60)
GLUCOSE: 133 mg/dL — AB (ref 70–99)
Potassium: 3.4 mmol/L — ABNORMAL LOW (ref 3.5–5.1)
Sodium: 135 mmol/L (ref 135–145)

## 2018-04-14 LAB — MAGNESIUM: Magnesium: 2 mg/dL (ref 1.7–2.4)

## 2018-04-14 LAB — TSH: TSH: 5.772 u[IU]/mL — ABNORMAL HIGH (ref 0.350–4.500)

## 2018-04-14 LAB — DIGOXIN LEVEL: Digoxin Level: 1.1 ng/mL (ref 0.8–2.0)

## 2018-04-14 MED ORDER — HEPARIN SODIUM (PORCINE) 5000 UNIT/ML IJ SOLN
5000.0000 [IU] | Freq: Two times a day (BID) | INTRAMUSCULAR | Status: DC
  Administered 2018-04-14 – 2018-04-15 (×3): 5000 [IU] via SUBCUTANEOUS
  Filled 2018-04-14 (×3): qty 1

## 2018-04-14 MED ORDER — METOPROLOL TARTRATE 5 MG/5ML IV SOLN
INTRAVENOUS | Status: AC
  Filled 2018-04-14: qty 5

## 2018-04-14 MED ORDER — DIGOXIN 0.25 MG/ML IJ SOLN
0.5000 mg | Freq: Once | INTRAMUSCULAR | Status: AC
  Administered 2018-04-14: 0.5 mg via INTRAVENOUS
  Filled 2018-04-14: qty 2

## 2018-04-14 MED ORDER — AMIODARONE LOAD VIA INFUSION
150.0000 mg | Freq: Once | INTRAVENOUS | Status: AC
  Administered 2018-04-14: 150 mg via INTRAVENOUS
  Filled 2018-04-14: qty 83.34

## 2018-04-14 MED ORDER — METOPROLOL TARTRATE 5 MG/5ML IV SOLN
5.0000 mg | INTRAVENOUS | Status: DC | PRN
  Administered 2018-04-14 (×2): 5 mg via INTRAVENOUS
  Filled 2018-04-14: qty 5

## 2018-04-14 MED ORDER — DIGOXIN 0.25 MG/ML IJ SOLN: 0.2500 mg | Freq: Once | INTRAMUSCULAR | Status: DC

## 2018-04-14 MED ORDER — AMIODARONE HCL IN DEXTROSE 360-4.14 MG/200ML-% IV SOLN
60.0000 mg/h | INTRAVENOUS | Status: DC
  Administered 2018-04-14 (×2): 60 mg/h via INTRAVENOUS
  Filled 2018-04-14 (×2): qty 200

## 2018-04-14 MED ORDER — AMIODARONE HCL IN DEXTROSE 360-4.14 MG/200ML-% IV SOLN
30.0000 mg/h | INTRAVENOUS | Status: DC
  Administered 2018-04-15 (×2): 30 mg/h via INTRAVENOUS
  Filled 2018-04-14: qty 200

## 2018-04-14 NOTE — Progress Notes (Signed)
HR in the 130-150's sustaining. MD paged. Pt is resting in bed at this time. Raelan Burgoon, [place] Scrape, RN

## 2018-04-14 NOTE — Procedures (Signed)
Extubation Procedure Note  Patient Details:   Name: Charles Patrick DOB: 1924-05-04 MRN: [id number]   Airway Documentation:    Vent end date: 04/14/18 Vent end time: 0844   Evaluation  O2 sats: stable throughout Complications: No apparent complications Patient did tolerate procedure well. Bilateral Breath Sounds: Clear, Diminished   Yes   Pt extubated to room air.  No stridor noted.  RN @ bedside.  Christophe Louis 04/14/2018, 8:45 AM

## 2018-04-14 NOTE — Progress Notes (Signed)
Patient extubated by RT. This RN at the bedside. Patient follows all commands and is oriented x3. He is on room air at 98%. Bed alarm is on and restraints have been removed. Monitoring closely. Isiaha Greenup, [place] Scrape, RN

## 2018-04-14 NOTE — Progress Notes (Signed)
82mL of IV Fentanyl wasted in the sink with [employer]. Andron Marrazzo, [place] Scrape, RN

## 2018-04-14 NOTE — Progress Notes (Signed)
NAME:  Charles Patrick, MRN:  [id number], DOB:  February 16, 1925, LOS: 3 ADMISSION DATE:  04/11/2018, CONSULTATION DATE: February 14 REFERRING MD: Dr. Nicanor Alcon, CHIEF COMPLAINT: Altered mental status, subdural hematoma  Brief History   85 year old male unresponsive at home with seizure history also found to have bilateral acute/subacute subdural hematoma.  Intubated in the emergency department for airway protection, admitted to ICU.  NSGY felt no surgical intervention required.      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  Significant Hospital Events   2/14 Admit with AMS, SDH 2/15 Failed SBT with apnea  Consults:  Neurology Neurosurgery  Procedures:  ETT 2/14 >  Significant Diagnostic Tests:  CT head 2/14 > Bilateral primarily low-density subdural collection along the cerebral convexities measuring up to 9 mm in thickness. EEG 2/15 >> negative   Micro Data:    Antimicrobials:     Interim history/subjective:  EEG completed.  No acute events overnight.  Remains on low dose propofol.  Fentanyl gtt off.   Objective   Blood pressure (!) 129/93, pulse 82, temperature (!) 100.9 F (38.3 C), temperature source Oral, resp. rate 15, height 6\' 2"  (1.88 m), weight 76.5 kg, SpO2 100 %.    Vent Mode: PRVC FiO2 (%):  [30 %] 30 % Set Rate:  [14 bmp] 14 bmp Vt Set:  [650 mL] 650 mL PEEP:  [5 cmH20] 5 cmH20 Plateau Pressure:  [14 cmH20-21 cmH20] 17 cmH20   Intake/Output Summary (Last 24 hours) at  04/14/2018 0370 Last data filed at 04/14/2018 0800 Gross per 24 hour  Intake 1712.87 ml  Output 405 ml  Net 1307.87 ml   Filed Weights   04/11/18 0629 04/12/18 0500  Weight: 81.6 kg 76.5 kg    Examination: General: elderly male lying in bed on vent  HEENT: MM pink/moist, ETT Neuro: Awakens to voice, nods yes/no, shifts in bed / MAE CV: s1s2 rrr, no m/r/g PULM: even/non-labored, lungs bilaterally coarse  DU:KRCV, non-tender, bsx4 active  Extremities: warm/dry, no edema, R BKA  Skin: no rashes or lesions  Resolved Hospital Problem list     Assessment & Plan:   Critically ill due to acute respiratory failure due to inability to protect airway, requiring mechanical ventilation. No evidence of airspace disease-should be ready for extubation now that mental status has cleared. Bilateral subdural hematomas, small in size not amenable to intervention Acute encephalopathy, possibly postictal state now resolving Hypothyroidism Status post right BKA   PLAN:  Spontaneous breathing trial and extubation today Monitor for respiratory disc compensation post extubation-if tolerated may transfer to floor tomorrow Continue anticonvulsant medication  Best practice:  Diet: NPO - tube feeds, bedside swallow once extubated Pain/Anxiety/Delirium protocol (if indicated): Off sedation for extubation VAP protocol (if indicated): Yes DVT prophylaxis: Unfractionated heparin GI prophylaxis: Protonix Glucose control: Monitoring Mobility: Up to chair once extubated Code Status: Full Code   Family Communication: No family bedside am 2/16   Disposition: ICU  Labs   CBC: Recent Labs  Lab 04/11/18 0515 04/11/18 0555 04/12/18 0520 04/12/18 1127 04/13/18 0414  WBC 7.3  --   --  11.9* 15.2*  NEUTROABS 4.1  --   --   --   --  HGB 10.3* 11.6* 10.2* 10.6* 10.4*  HCT 35.4* 34.0* 30.0* 34.4* 34.1*  MCV 72.5*  --   --  70.9* 70.7*  PLT 182  --   --  165 177    Basic Metabolic Panel: Recent  Labs  Lab 04/11/18 0515 04/11/18 0520 04/11/18 0555 04/12/18 0520 04/12/18 0723 04/13/18 0414  NA 140  --  141 138 138 137  K 3.5  --  3.4* 3.7 4.1 3.4*  CL 110  --   --   --  110 106  CO2 22  --   --   --  19* 22  GLUCOSE 116*  --   --   --  88 118*  BUN 11  --   --   --  10 12  CREATININE 1.04 0.90  --   --  1.24 1.03  CALCIUM 8.5*  --   --   --  8.8* 8.5*  MG  --   --   --   --  1.9 2.1  PHOS  --   --   --   --  2.5 3.7   GFR: Estimated Creatinine Clearance: 50.5 mL/min (by C-G formula based on SCr of 1.03 mg/dL). Recent Labs  Lab 04/11/18 0515 04/12/18 1127 04/13/18 0414  WBC 7.3 11.9* 15.2*    Liver Function Tests: Recent Labs  Lab 04/11/18 0515  AST 16  ALT 10  ALKPHOS 57  BILITOT 0.4  PROT 6.4*  ALBUMIN 3.2*   No results for input(s): LIPASE, AMYLASE in the last 168 hours. No results for input(s): AMMONIA in the last 168 hours.  ABG    Component Value Date/Time   PHART 7.568 (H) 04/12/2018 0520   PCO2ART 22.3 (L) 04/12/2018 0520   PO2ART 102.0 04/12/2018 0520   HCO3 20.3 04/12/2018 0520   TCO2 21 (L) 04/12/2018 0520   ACIDBASEDEF 1.0 04/12/2018 0520   O2SAT 99.0 04/12/2018 0520     Coagulation Profile: Recent Labs  Lab 04/11/18 0515  INR 1.12    Cardiac Enzymes: No results for input(s): CKTOTAL, CKMB, CKMBINDEX, TROPONINI in the last 168 hours.  HbA1C: Hgb A1c MFr Bld  Date/Time Value Ref Range Status  01/18/2017 11:32 AM 5.6 4.8 - 5.6 % Final    Comment:    (NOTE) Pre diabetes:          5.7%-6.4% Diabetes:              >6.4% Glycemic control for   <7.0% adults with diabetes     CBG: Recent Labs  Lab 04/13/18 1146 04/13/18 1616 04/13/18 1935 04/13/18 2332 04/14/18 0334  GLUCAP 106* 126* 103* 138* 161*    Critical care time:  40 min including chart data review, examination of patient, multidisciplinary rounds, and frequent assessment and modification of ventilator therapy.    Lynnell Catalan, MD [facility] ICU Physician [facility]  [facility]  Pager: [phone] Mobile: [phone] After hours: [phone].  04/14/2018, 8:09 AM

## 2018-04-15 ENCOUNTER — Inpatient Hospital Stay (HOSPITAL_COMMUNITY): Payer: Medicare Other

## 2018-04-15 LAB — BASIC METABOLIC PANEL
Anion gap: 10 (ref 5–15)
BUN: 16 mg/dL (ref 8–23)
CO2: 22 mmol/L (ref 22–32)
CREATININE: 1.1 mg/dL (ref 0.61–1.24)
Calcium: 8.5 mg/dL — ABNORMAL LOW (ref 8.9–10.3)
Chloride: 104 mmol/L (ref 98–111)
GFR calc Af Amer: >60 mL/min (ref >60)
GFR calc non Af Amer: 58 mL/min — ABNORMAL LOW (ref >60)
Glucose, Bld: 113 mg/dL — ABNORMAL HIGH (ref 70–99)
Potassium: 3.5 mmol/L (ref 3.5–5.1)
Sodium: 136 mmol/L (ref 135–145)

## 2018-04-15 LAB — GLUCOSE, CAPILLARY
Glucose-Capillary: 101 mg/dL — ABNORMAL HIGH (ref 70–99)
Glucose-Capillary: 102 mg/dL — ABNORMAL HIGH (ref 70–99)
Glucose-Capillary: 117 mg/dL — ABNORMAL HIGH (ref 70–99)
Glucose-Capillary: 90 mg/dL (ref 70–99)
Glucose-Capillary: 95 mg/dL (ref 70–99)
Glucose-Capillary: 98 mg/dL (ref 70–99)

## 2018-04-15 LAB — MAGNESIUM: Magnesium: 2.1 mg/dL (ref 1.7–2.4)

## 2018-04-15 MED ORDER — LEVETIRACETAM 500 MG PO TABS
500.0000 mg | ORAL_TABLET | Freq: Two times a day (BID) | ORAL | Status: DC
  Administered 2018-04-15 – 2018-04-16 (×2): 500 mg via ORAL
  Filled 2018-04-15 (×2): qty 1

## 2018-04-15 MED ORDER — ADULT MULTIVITAMIN W/MINERALS CH
1.0000 | ORAL_TABLET | Freq: Every day | ORAL | Status: DC
  Administered 2018-04-16: 1 via ORAL
  Filled 2018-04-15: qty 1

## 2018-04-15 MED ORDER — GABAPENTIN 600 MG PO TABS
300.0000 mg | ORAL_TABLET | Freq: Two times a day (BID) | ORAL | Status: DC
  Administered 2018-04-15: 300 mg via ORAL
  Filled 2018-04-15: qty 0.5
  Filled 2018-04-15 (×3): qty 1

## 2018-04-15 MED ORDER — TAMSULOSIN HCL 0.4 MG PO CAPS
0.4000 mg | ORAL_CAPSULE | Freq: Every day | ORAL | Status: DC
  Administered 2018-04-15: 0.4 mg via ORAL
  Filled 2018-04-15 (×2): qty 1

## 2018-04-15 MED ORDER — DOCUSATE SODIUM 100 MG PO CAPS
100.0000 mg | ORAL_CAPSULE | Freq: Two times a day (BID) | ORAL | Status: DC
  Administered 2018-04-15 – 2018-04-16 (×2): 100 mg via ORAL
  Filled 2018-04-15 (×2): qty 1

## 2018-04-15 MED ORDER — MULTIVITAMINS PO CAPS: 1.0000 | ORAL_CAPSULE | Freq: Every day | ORAL | Status: DC

## 2018-04-15 MED ORDER — LEVOTHYROXINE SODIUM 50 MCG PO TABS
50.0000 ug | ORAL_TABLET | Freq: Every day | ORAL | Status: DC
  Administered 2018-04-16: 50 ug via ORAL
  Filled 2018-04-15: qty 1

## 2018-04-15 MED ORDER — DILTIAZEM HCL 60 MG PO TABS
30.0000 mg | ORAL_TABLET | Freq: Four times a day (QID) | ORAL | Status: DC
  Administered 2018-04-15 – 2018-04-16 (×4): 30 mg via ORAL
  Filled 2018-04-15 (×5): qty 1

## 2018-04-15 MED ORDER — CLOPIDOGREL BISULFATE 75 MG PO TABS
75.0000 mg | ORAL_TABLET | Freq: Every day | ORAL | Status: DC
  Administered 2018-04-16: 75 mg via ORAL
  Filled 2018-04-15: qty 1

## 2018-04-15 MED ORDER — SIMVASTATIN 5 MG PO TABS
5.0000 mg | ORAL_TABLET | Freq: Every day | ORAL | Status: DC
  Administered 2018-04-15: 5 mg via ORAL
  Filled 2018-04-15 (×2): qty 1

## 2018-04-15 MED ORDER — VITAMIN C 500 MG PO TABS
500.0000 mg | ORAL_TABLET | Freq: Every day | ORAL | Status: DC
  Administered 2018-04-16: 500 mg via ORAL
  Filled 2018-04-15: qty 1

## 2018-04-15 MED ORDER — ENOXAPARIN SODIUM 40 MG/0.4ML ~~LOC~~ SOLN
40.0000 mg | SUBCUTANEOUS | Status: DC
  Administered 2018-04-15 – 2018-04-16 (×2): 40 mg via SUBCUTANEOUS
  Filled 2018-04-15 (×2): qty 0.4

## 2018-04-15 NOTE — Progress Notes (Addendum)
Rehab Admissions Coordinator Note:  Patient was screened by Clois Dupes for appropriateness for an Inpatient Acute Rehab Consult per PT recommendation. [employer] denied approval for CIR 07/2017 after his BKA. Patient is now Medicare.   At this time, we are recommending Inpatient Rehab consult. Please place order for consult.  Clois Dupes 04/15/2018, 12:12 PM  I can be reached at [phone].

## 2018-04-15 NOTE — Progress Notes (Signed)
eLink Physician-Brief Progress Note Patient Name: Charles Patrick DOB: December 18, 1924 MRN: [id number]   Date of Service  04/15/2018  HPI/Events of Note  Agitation - Request for soft waist belt restraint.   eICU Interventions  Will order: 1. Soft waist belt restraint.      Intervention Category Major Interventions: Delirium, psychosis, severe agitation - evaluation and management  Sommer,Steven Eugene 04/15/2018, 2:48 AM

## 2018-04-15 NOTE — Progress Notes (Signed)
Modified Barium Swallow Progress Note  Patient Details  Name: Charles Patrick MRN: [id number] Date of Birth: February 17, 1925  Today's Date: 04/15/2018  Modified Barium Swallow completed.  Full report located under Chart Review in the Imaging Section.  Brief recommendations include the following:  Clinical Impression  Pt was seen in radiology suite for modified barium swallow study. He was seated upright in swallow chair for the study which was conducted in lateral view. Trials of puree, thin liquids, nectar thick liquids, and honey thick liquids were administered. He presents with moderate-severe pharyngeal phase dysphagia. He demonstrated reduced laryngeal excursion which resulted in incomplete epiglottic retroversion, pyriform sinus residue across all consistencies, and consistent penetration of liquids. Reduced lingual retraction resulted in moderate vallecular residue and mild-moderate pharyngeal wall residue was observed due to reduced pharyngeal constriction. No instances of aspiraton were observed but pt is at moderate risk for aspiration if swallowing precautions are not strictly observed. A dysphagia 1 diet with nectar thick liquids is recommended at this time with use of compensatory strategies to reduce aspiration risk. SLP will follow to ensure tolerance of the current diet, his ability to safely tolerate more advanced consistencies and the need for further intervention.    Swallow Evaluation Recommendations       SLP Diet Recommendations: Dysphagia 1 (Puree) solids;Nectar thick liquid   Liquid Administration via: Cup;No straw   Medication Administration: Whole meds with puree   Supervision: Staff to assist with self feeding;Full supervision/cueing for compensatory strategies   Compensations: Slow rate;Small sips/bites;Multiple dry swallows after each bite/sip;Chin tuck;Follow solids with liquid,    Postural Changes: Seated upright at 90 degrees   Oral Care Recommendations: Oral  care QID     Agnes Probert I. Vear Clock, MS, CCC-SLP Acute Rehabilitation Services Office number [phone] Pager [phone]  Scheryl Marten 04/15/2018,3:36 PM

## 2018-04-15 NOTE — Evaluation (Addendum)
Physical Therapy Evaluation Patient Details Name: Charles Patrick MRN: [id number] DOB: [birth year] Today's Date: 04/15/2018   History of Present Illness  85 yo found unresponsive at home with bil SDH admitted 2/14 with intubation for airway protection s/p extubation 2/17. PMhx: BPH, CAD, HTN, TAVR, Sz disorder, Rt BKA June 2019  Clinical Impression  Pt pleasantly confused on arrival stating he walks independently and the year is 2021. Pt educated for month and year and able to recall end of session. PT also stating during gait that he uses a walker all the time and always walks with it too anterior. Pt needs max cues for standing and gait activity with max cues for safety for use of RW to prevent falls. PT with decreased transfers, balance, gait and safety who will benefit from acute therapy to maximize mobility and safety to decrease burden of care.  HR 61 SpO2 100% on 1L dropped to 86% briefly on RA and remained on 1L during activity 111/77 sitting    Follow Up Recommendations Supervision/Assistance - 24 hour;CIR    Equipment Recommendations  None recommended by PT    Recommendations for Other Services Rehab consult     Precautions / Restrictions Precautions Precautions: Fall Precaution Comments: blind right eye, prosthesis Restrictions Weight Bearing Restrictions: No      Mobility  Bed Mobility Overal bed mobility: Needs Assistance Bed Mobility: Supine to Sit     Supine to sit: Min guard     General bed mobility comments: guarding for lines with HOB 20 degrees and pt able to achieve sitting without physical assist  Transfers Overall transfer level: Needs assistance   Transfers: Sit to/from Stand Sit to Stand: Mod assist;+2 physical assistance         General transfer comment: mod +2 assist to stand from bed with posterior lean and assist for balance and rising from surface  Ambulation/Gait Ambulation/Gait assistance: Mod assist;+2 safety/equipment;+2 physical  assistance Gait Distance (Feet): 100 Feet Assistive device: Rolling walker (2 wheeled) Gait Pattern/deviations: Step-to pattern;Narrow base of support;Trunk flexed   Gait velocity interpretation: 1.31 - 2.62 ft/sec, indicative of limited community ambulator General Gait Details: pt with flexed trunk, very narrow BOS, right lean with tendency to maintain self very posterior to Rw. Cues for posture, position in RW, safety and +2 for lines, safety and balance with directional cues  Stairs            Wheelchair Mobility    Modified Rankin (Stroke Patients Only) Modified Rankin (Stroke Patients Only) Pre-Morbid Rankin Score: No significant disability Modified Rankin: Moderately severe disability     Balance Overall balance assessment: Needs assistance   Sitting balance-Leahy Scale: Good Sitting balance - Comments: pt able to sit EOB to don prosthesis without assist     Standing balance-Leahy Scale: Fair Standing balance comment: bil UE support with assist for balance in standing                             Pertinent Vitals/Pain Pain Assessment: No/denies pain    Home Living Family/patient expects to be discharged to:: Private residence Living Arrangements: Spouse/significant other Available Help at Discharge: Family;Available 24 hours/day Type of Home: House Home Access: Stairs to enter   [employer] of Steps: 1 Home Layout: One level Home Equipment: Walker - 2 wheels Additional Comments: glasses     Prior Function Level of Independence: Independent with assistive device(s)         Comments:  pt was walking with walker per his report and caring for himself but no family present to confirm     Hand Dominance   Dominant Hand: Right    Extremity/Trunk Assessment   Upper Extremity Assessment Upper Extremity Assessment: Defer to OT evaluation    Lower Extremity Assessment Lower Extremity Assessment: Overall WFL for tasks assessed     Cervical / Trunk Assessment Cervical / Trunk Assessment: Normal  Communication   Communication: No difficulties  Cognition Arousal/Alertness: Awake/alert Behavior During Therapy: Flat affect Overall Cognitive Status: Impaired/Different from baseline Area of Impairment: Orientation;Attention;Memory;Problem solving                 Orientation Level: Disoriented to;Time;Place;Situation Current Attention Level: Sustained Memory: Decreased short-term memory       Problem Solving: Slow processing;Requires verbal cues General Comments: pt stating year 2021, in the hospital to do some work, very slow donning prosthesis with increased time to find hole in socks to don      General Comments      Exercises     Assessment/Plan    PT Assessment Patient needs continued PT services  PT Problem List Decreased strength;Decreased balance;Decreased cognition;Decreased mobility;Decreased knowledge of use of DME;Decreased activity tolerance;Decreased coordination;Decreased safety awareness       PT Treatment Interventions Gait training;Therapeutic activities;Neuromuscular re-education;Stair training;Therapeutic exercise;Cognitive remediation;DME instruction;Functional mobility training;Balance training;Patient/family education    PT Goals (Current goals can be found in the Care Plan section)  Acute Rehab PT Goals Patient Stated Goal: return home PT Goal Formulation: With patient Time For Goal Achievement: 04/29/18 Potential to Achieve Goals: Fair    Frequency Min 4X/week   Barriers to discharge Decreased caregiver support      Co-evaluation PT/OT/SLP Co-Evaluation/Treatment: Yes Reason for Co-Treatment: Complexity of the patient's impairments (multi-system involvement);Necessary to address cognition/behavior during functional activity PT goals addressed during session: Mobility/safety with mobility         AM-PAC PT "6 Clicks" Mobility  Outcome Measure Help needed  turning from your back to your side while in a flat bed without using bedrails?: A Little Help needed moving from lying on your back to sitting on the side of a flat bed without using bedrails?: A Little Help needed moving to and from a bed to a chair (including a wheelchair)?: A Lot Help needed standing up from a chair using your arms (e.g., wheelchair or bedside chair)?: A Lot Help needed to walk in hospital room?: A Lot Help needed climbing 3-5 steps with a railing? : Total 6 Click Score: 13    End of Session Equipment Utilized During Treatment: Gait belt;Other (comment)(prosthesis) Activity Tolerance: Patient tolerated treatment well Patient left: in chair;with call bell/phone within reach;with chair alarm set;with restraints reapplied(posey) Nurse Communication: Mobility status;Precautions PT Visit Diagnosis: Unsteadiness on feet (R26.81);Muscle weakness (generalized) (M62.81);Other abnormalities of gait and mobility (R26.89)    Time: 9323-5573 PT Time Calculation (min) (ACUTE ONLY): 41 min   Charges:   PT Evaluation $PT Eval Moderate Complexity: 1 Mod          Alec Mcphee Abner Greenspan, PT Acute Rehabilitation Services Pager: [phone] Office: [phone]   Lucero Auzenne B Aarohi Redditt 04/15/2018, 10:35 AM

## 2018-04-15 NOTE — Evaluation (Addendum)
Occupational Therapy Evaluation Patient Details Name: Charles Patrick MRN: [id number] DOB: 03/28/1924 Today's Date: 04/15/2018    History of Present Illness 85 yo found unresponsive at home with bil SDH admitted 2/14 with intubation for airway protection s/p extubation 2/17. PMhx: BPH, CAD, HTN, TAVR, Sz disorder, Rt BKA   Clinical Impression   PTA patient reports independent with ADLs, IADLs (including med mgmt) and using RW for mobility, but inconsistent history (as initally reports using no AD for mobility).  Patient admitted for above and limited by impaired cognition, decreased safety awareness, generalized weakness, and impaired balance.  He is able to follow commands, but requires constant cueing for safety (walker mgmt), disoriented to time, place and situation (but able to recall at end of session).  He is able to complete UB ADLs seated with setup/supervision, requires mod assist +2 for LB ADLs (sit to stand, although able to don prosthesis without assist given increased time), basic transfers using RW with mod assist +2. VSS during session-- BP 111/77 at completion of session with SPo2 100% on 1L after dropping briefly to 86% on RA.  Patient will benefit from continued OT services while admitted and after dc at CIR level in order to optimize independence and safety with ADLs/mobility as well as decrease burden of care.     Follow Up Recommendations  Supervision/Assistance - 24 hour;CIR    Equipment Recommendations  Other (comment)(TBD at next venue of care)    Recommendations for Other Services       Precautions / Restrictions Precautions Precautions: Fall Precaution Comments: blind right eye, prosthesis (R BKA) Restrictions Weight Bearing Restrictions: No      Mobility Bed Mobility Overal bed mobility: Needs Assistance Bed Mobility: Supine to Sit     Supine to sit: Min guard     General bed mobility comments: guarding for lines with HOB 20 degrees and pt able to  achieve sitting without physical assist  Transfers Overall transfer level: Needs assistance Equipment used: None Transfers: Sit to/from Stand Sit to Stand: Mod assist;+2 physical assistance         General transfer comment: mod +2 assist to stand from bed with posterior lean and assist for balance and rising from surface, cueing for hand placement and safety    Balance Overall balance assessment: Needs assistance   Sitting balance-Leahy Scale: Good Sitting balance - Comments: min guard to supervision while donning prosthesis    Standing balance support: Bilateral upper extremity supported;During functional activity Standing balance-Leahy Scale: Fair Standing balance comment: reliant on B UE support during standing                            ADL either performed or assessed with clinical judgement   ADL Overall ADL's : Needs assistance/impaired     Grooming: Set up;Sitting   Upper Body Bathing: Set up;Sitting   Lower Body Bathing: Minimal assistance;+2 for safety/equipment;Sit to/from stand   Upper Body Dressing : Set up;Sitting   Lower Body Dressing: Moderate assistance;Sit to/from stand;+2 for physical assistance;+2 for safety/equipment Lower Body Dressing Details (indicate cue type and reason): able to don R prosthesis with increased time, min guard for forward lean for safety, mod assist +2 sit>stand  Toilet Transfer: Moderate assistance;+2 for safety/equipment;+2 for physical assistance;Ambulation;RW Toilet Transfer Details (indicate cue type and reason): simulated to recliner          Functional mobility during ADLs: Minimal assistance;Moderate assistance;+2 for physical assistance;+2 for safety/equipment;Rolling walker;Cueing  for safety General ADL Comments: patient limited by generalized weakness, cogniton and safety      Vision Baseline Vision/History: Legally blind;Wears glasses(R eye, ) Wears Glasses: At all times Patient Visual Report: No  change from baseline(does not have glasses) Vision Assessment?: Yes Eye Alignment: Within Functional Limits Ocular Range of Motion: Within Functional Limits Alignment/Gaze Preference: Within Defined Limits Tracking/Visual Pursuits: Able to track stimulus in all quads without difficulty Additional Comments: increased time to don socks for prosthesis, but appears [facility] for vision      Perception     Praxis      Pertinent Vitals/Pain Pain Assessment: No/denies pain     Hand Dominance Right   Extremity/Trunk Assessment Upper Extremity Assessment Upper Extremity Assessment: Generalized weakness   Lower Extremity Assessment Lower Extremity Assessment: Defer to PT evaluation   Cervical / Trunk Assessment Cervical / Trunk Assessment: Normal   Communication Communication Communication: No difficulties   Cognition Arousal/Alertness: Awake/alert Behavior During Therapy: Flat affect Overall Cognitive Status: Impaired/Different from baseline Area of Impairment: Orientation;Attention;Memory;Problem solving;Safety/judgement;Awareness                 Orientation Level: Disoriented to;Time;Place;Situation Current Attention Level: Sustained Memory: Decreased short-term memory   Safety/Judgement: Decreased awareness of safety;Decreased awareness of deficits Awareness: Emergent Problem Solving: Slow processing;Requires verbal cues General Comments: patient requires increased time and effort for all activities, requires re-orientation to place, year and situation (but able to recall at completion of session), decreased safety awareness noted with mobility    General Comments  VSS    Exercises     Shoulder Instructions      Home Living Family/patient expects to be discharged to:: Private residence Living Arrangements: Spouse/significant other Available Help at Discharge: Family;Available 24 hours/day Type of Home: House Home Access: Stairs to enter [employer] of  Steps: 1   Home Layout: One level     Bathroom Shower/Tub: Chief Strategy Officer: Handicapped height     Home Equipment: Environmental consultant - 2 wheels   Additional Comments: glasses       Prior Functioning/Environment Level of Independence: Independent with assistive device(s)        Comments: pt reports independent ADLs, using RW for mobility but no family present to confirm (inconsistent history, initally reporting not using any AD)        OT Problem List: Decreased strength;Decreased activity tolerance;Impaired balance (sitting and/or standing);Decreased cognition;Decreased safety awareness;Decreased knowledge of use of DME or AE;Decreased knowledge of precautions      OT Treatment/Interventions: Self-care/ADL training;DME and/or AE instruction;Therapeutic exercise;Therapeutic activities;Patient/family education;Balance training;Cognitive remediation/compensation    OT Goals(Current goals can be found in the care plan section) Acute Rehab OT Goals Patient Stated Goal: return home OT Goal Formulation: With patient Time For Goal Achievement: 04/29/18 Potential to Achieve Goals: Good  OT Frequency: Min 2X/week   Barriers to D/C:            Co-evaluation PT/OT/SLP Co-Evaluation/Treatment: Yes Reason for Co-Treatment: Complexity of the patient's impairments (multi-system involvement);Necessary to address cognition/behavior during functional activity;For patient/therapist safety;To address functional/ADL transfers PT goals addressed during session: Mobility/safety with mobility OT goals addressed during session: ADL's and self-care;Other (comment)(mobility)      AM-PAC OT "6 Clicks" Daily Activity     Outcome Measure Help from another person eating meals?: Total(NPO) Help from another person taking care of personal grooming?: A Little Help from another person toileting, which includes using toliet, bedpan, or urinal?: Total Help from another person bathing  (including washing,  rinsing, drying)?: A Lot Help from another person to put on and taking off regular upper body clothing?: A Little Help from another person to put on and taking off regular lower body clothing?: A Lot 6 Click Score: 12   End of Session Equipment Utilized During Treatment: Gait belt;Rolling walker;Other (comment)(prosthesis ) Nurse Communication: Mobility status  Activity Tolerance: Patient tolerated treatment well Patient left: in chair;with call bell/phone within reach;with chair alarm set;with restraints reapplied  OT Visit Diagnosis: Other abnormalities of gait and mobility (R26.89);Muscle weakness (generalized) (M62.81);Other symptoms and signs involving cognitive function                Time: [id number] OT Time Calculation (min): 39 min Charges:  OT General Charges $OT Visit: 1 Visit OT Evaluation $OT Eval Moderate Complexity: 1 Mod  Chancy Milroyhristie S Dimonique Bourdeau, [place] Acute Rehabilitation Services Pager [phone] Office [phone]   Chancy MilroyChristie S Yousuf Ager 04/15/2018, 11:27 AM

## 2018-04-15 NOTE — Progress Notes (Signed)
NAME:  Charles Patrick, MRN:  [id number], DOB:  12-Oct-1924, LOS: 4 ADMISSION DATE:  04/11/2018, CONSULTATION DATE: February 14 REFERRING MD: Dr. Nicanor Alcon, CHIEF COMPLAINT: Altered mental status, subdural hematoma  Brief History   85 year old male unresponsive at home with seizure history also found to have bilateral acute/subacute subdural hematoma.  Intubated in the emergency department for airway protection, admitted to ICU.  NSGY felt no surgical intervention required.      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  Significant Hospital Events   2/14 Admit with AMS, SDH 2/15 Failed SBT with apnea  Consults:  Neurology Neurosurgery  Procedures:  ETT 2/14 >2/17  Significant Diagnostic Tests:  CT head 2/14 > Bilateral primarily low-density subdural collection along the cerebral convexities measuring up to 9 mm in thickness. EEG 2/15 >> negative  Pharyngeal function study 2/18 >> moderate to severe dysphagia. Dysphagia 1 diet recommended.  Micro Data:  N/A  Antimicrobials:  N/A  Interim history/subjective:  Extubated yesterday. Periods of confusion. Started on amiodarone for rate control of atrial fibrillation.   Objective   Blood pressure 129/78, pulse (!) 55, temperature 97.6 F (36.4 C), temperature source Oral, resp. rate 13, height 6\' 2"  (1.88 m), weight 74.8 kg, SpO2 99 %.        Intake/Output Summary (Last 24 hours) at 04/15/2018 1546 Last data filed at 04/15/2018 1500 Gross per 24  hour  Intake 706.52 ml  Output 459 ml  Net 247.52 ml   Filed Weights   04/11/18 0629 04/12/18 0500 04/15/18 0400  Weight: 81.6 kg 76.5 kg 74.8 kg    Examination: General: elderly male sitting in bed calmly HEENT: MM pink/moist, ETT Neuro: awake and interactive.  Follows commands in all limbs with no focal deficits. CV: s1s2 rrr, no m/r/g PULM: even/non-labored, lungs bilaterally coarse  WL:NLGX, non-tender, bsx4 active  Extremities: warm/dry, no edema, R BKA  Skin: no rashes or lesions  Resolved Hospital Problem list   Respiratory failure  Assessment & Plan:   Was critically ill due to acute respiratory failure due to seizures and encephalopathy Bilateral subdural hematomas, small in size not amenable to intervention Acute encephalopathy, possibly postictal state now resolved Hypothyroidism Status post right BKA Rapid atrial fibrillation on amiodarone infusion. Dysphagia - Dysphagia 1 diet recommended.   PLAN:  Convert anticonvulsant medication to oral Progressive ambulation Start oral  diltiazem for Afib control and stop amiodarone infusion. Resume home medications   Best practice:  Diet: dysphagia 1 diet. Pain/Anxiety/Delirium protocol (if indicated): Off sedation for extubation VAP protocol (if indicated): Yes DVT prophylaxis: Unfractionated heparin GI prophylaxis: Protonix Glucose control: Monitoring Mobility: Up to chair once extubated Code Status: Full Code   Family Communication: No family bedside am 2/18 Disposition: transfer in am if no further rapid AF.  Labs   CBC: Recent Labs  Lab 04/11/18 0515 04/11/18 0555 04/12/18 0520 04/12/18 1127 04/13/18 0414 04/14/18 0811  WBC 7.3  --   --  11.9* 15.2* 15.5*  NEUTROABS 4.1  --   --   --   --   --   HGB 10.3* 11.6* 10.2* 10.6* 10.4* 10.8*  HCT 35.4* 34.0* 30.0* 34.4*  34.1* 35.3*  MCV 72.5*  --   --  70.9* 70.7* 70.5*  PLT 182  --   --  165 177 146*    Basic Metabolic Panel: Recent Labs  Lab  04/11/18 0515 04/11/18 0520  04/12/18 0520 04/12/18 0723 04/13/18 0414 04/14/18 0811 04/14/18 1905 04/15/18 0439  NA 140  --    < > 138 138 137 135  --  136  K 3.5  --    < > 3.7 4.1 3.4* 3.4*  --  3.5  CL 110  --   --   --  110 106 104  --  104  CO2 22  --   --   --  19* 22 20*  --  22  GLUCOSE 116*  --   --   --  88 118* 133*  --  113*  BUN 11  --   --   --  10 12 18   --  16  CREATININE 1.04 0.90  --   --  1.24 1.03 1.12  --  1.10  CALCIUM 8.5*  --   --   --  8.8* 8.5* 8.3*  --  8.5*  MG  --   --   --   --  1.9 2.1  --  2.0 2.1  PHOS  --   --   --   --  2.5 3.7  --   --   --    < > = values in this interval not displayed.   GFR: Estimated Creatinine Clearance: 46.3 mL/min (by C-G formula based on SCr of 1.1 mg/dL). Recent Labs  Lab 04/11/18 0515 04/12/18 1127 04/13/18 0414 04/14/18 0811  WBC 7.3 11.9* 15.2* 15.5*    Liver Function Tests: Recent Labs  Lab 04/11/18 0515 04/14/18 1905  AST 16 23  ALT 10 13  ALKPHOS 57 67  BILITOT 0.4 0.9  PROT 6.4* 7.0  ALBUMIN 3.2* 2.9*   No results for input(s): LIPASE, AMYLASE in the last 168 hours. No results for input(s): AMMONIA in the last 168 hours.  ABG    Component Value Date/Time   PHART 7.568 (H) 04/12/2018 0520   PCO2ART 22.3 (L) 04/12/2018 0520   PO2ART 102.0 04/12/2018 0520   HCO3 20.3 04/12/2018 0520   TCO2 21 (L) 04/12/2018 0520   ACIDBASEDEF 1.0 04/12/2018 0520   O2SAT 99.0 04/12/2018 0520     Coagulation Profile: Recent Labs  Lab 04/11/18 0515  INR 1.12    Cardiac Enzymes: No results for input(s): CKTOTAL, CKMB, CKMBINDEX, TROPONINI in the last 168 hours.  HbA1C: Hgb A1c MFr Bld  Date/Time Value Ref Range Status  01/18/2017 11:32 AM 5.6 4.8 - 5.6 % Final    Comment:    (NOTE) Pre diabetes:          5.7%-6.4% Diabetes:              >6.4% Glycemic control for   <7.0% adults with diabetes     CBG: Recent Labs  Lab 04/14/18 2003 04/14/18 2351 04/15/18 0255 04/15/18 0806  04/15/18 1150  GLUCAP 127* 111* 90 102* 98    Critical care time: N/A   Lynnell Catalan, MD [facility] ICU Physician [facility] [place] [facility]  Pager: [phone] Mobile: [phone] After hours: [phone].  04/15/2018, 3:46 PM

## 2018-04-15 NOTE — Evaluation (Signed)
Clinical/Bedside Swallow Evaluation Patient Details  Name: REGINAL ADVANI MRN: [id number] Date of Birth: 1924-12-28  Today's Date: 04/15/2018 Time: SLP Start Time (ACUTE ONLY): 0830 SLP Stop Time (ACUTE ONLY): 0848 SLP Time Calculation (min) (ACUTE ONLY): 18 min  Past Medical History:  Past Medical History:  Diagnosis Date  . Anemia   . Aortic valve stenosis   . Arthropathy   . BPH associated with nocturia   . Carotid artery disease (HCC)   . Colon polyp   . Dilated aortic root (HCC)   . Elevated PSA   . Erectile dysfunction   . Gastric paresis   . Hypercholesterolemia   . Hyperglycemia   . Hypertension    Patient denies  . Hypothyroid   . Iron deficiency   . Pulmonary hypertension (HCC)   . 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 Seizure disorder (HCC)   . Thrombocytopenia (HCC)   . Thyroid nodule   . Tricuspid regurgitation   . Vitamin D deficiency    Past Surgical History:  Past Surgical History:  Procedure Laterality Date  . AMPUTATION Right 08/19/2017   Procedure: AMPUTATION BELOW KNEE;  Surgeon: Myrene Galas, MD;  Location: [facility] OR;  Service: Orthopedics;  Laterality: Right;  . EXTERNAL FIXATION LEG Right 08/14/2017   Procedure: EXTERNAL FIXATION LEG;  Surgeon: Toni Arthurs, MD;  Location: [facility] OR;  Service: Orthopedics;  Laterality: Right;  . I&D EXTREMITY Right 08/16/2017   Procedure: IRRIGATION AND DEBRIDEMENT EXTREMITY;  Surgeon: Myrene Galas, MD;  Location[facility] OR;  Service: Orthopedics;  Laterality: Right;  . INCISION AND DRAINAGE Right 08/14/2017   Procedure: IRRIGATION AND DEBRIDMENT RIGHT ANKLE, APPLICATION WOUND VAC;  Surgeon: Toni Arthurs, MD;  Location: [facility] OR;  Service: Orthopedics;  Laterality: Right;  . MULTIPLE EXTRACTIONS WITH ALVEOLOPLASTY N/A 10/17/2016   Procedure: Extraction of tooth #'s 4, 21,22,27,28,29 with alveoloplasty;   Surgeon: Charlynne Pander, DDS;  Location: [facility] OR;  Service: Oral Surgery;  Laterality: N/A;  . NO PAST SURGERIES    . RIGHT/LEFT HEART CATH AND CORONARY ANGIOGRAPHY N/A 01/11/2017   Procedure: RIGHT/LEFT HEART CATH AND CORONARY ANGIOGRAPHY;  Surgeon: Tonny Bollman, MD;  Location: [facility] INVASIVE CV LAB;  Service: Cardiovascular;  Laterality: N/A;  . TEE WITHOUT CARDIOVERSION N/A 01/22/2017   Procedure: TRANSESOPHAGEAL ECHOCARDIOGRAM (TEE);  Surgeon: Tonny Bollman, MD;  Location: [facility] OR;  Service: Open Heart Surgery;  Laterality: N/A;  . TRANSCATHETER AORTIC VALVE REPLACEMENT, TRANSFEMORAL N/A 01/22/2017   Procedure: TRANSCATHETER AORTIC VALVE REPLACEMENT, TRANSFEMORAL using Edwards 29 Sapien 3 Aortic Valve;  Surgeon: Tonny Bollman, MD;  Location: [facility] OR;  Service: Open Heart Surgery;  Laterality: N/A;   HPI:  Pt is a 85 y.o male found with altered mental status at home. Intubated in the ER on 2/14. The CT of the head of 04/11/18 revealed bilateral primarily low-density subdural collection along the cerebral convexities measuring up to 9 mm in thickness. EEG was negative. Chest x-ray of 04/13/18 showed streaky opacities within the right mid lung region and at the right lung base suggesting pneumonia versus asymmetric edema. Pt was extubated on 04/14/18.    Assessment / Plan / Recommendation Clinical Impression  Pt denied a history of oropharyngeal dysphagia but stated that he avoids very hard foods due to his edentulous status. Oral mechanism exam was grossly within functional limits. Pt exhibited throat clearing and coughing with thin liquids via tsp and cup  and inconsistent throat clearing was noted with puree via tsp, suggesting possible aspiration. It is recommended that a modified barium swallow study be completed to further assess swallow function and to determine the least restrictive diet. The swallow study is currently scheduled for 1400; nursing and the pt have been educated regarding this.   SLP Visit Diagnosis: Dysphagia, pharyngeal phase (R13.13)    Aspiration Risk  Mild aspiration risk    Diet Recommendation NPO   Medication Administration: Via alternative means    Other  Recommendations Oral Care Recommendations: Oral care QID   Follow up Recommendations Other (comment)(Deferred until MBSS is completed)      Frequency and Duration min 2x/week  2 weeks       Prognosis Prognosis for Safe Diet Advancement: Good      Swallow Study   General Date of Onset: 04/14/18 HPI: Pt is a 85 y.o male found with altered mental status at home. Intubated in the ER on 2/14. The CT of the head of 04/11/18 revealed bilateral primarily low-density subdural collection along the cerebral convexities measuring up to 9 mm in thickness. EEG was negative. Chest x-ray of 04/13/18 showed streaky opacities within the right mid lung region and at the right lung base suggesting pneumonia versus asymmetric edema. Pt was extubated on 04/14/18.  Type of Study: Bedside Swallow Evaluation Previous Swallow Assessment: None Diet Prior to this Study: NPO Respiratory Status: Nasal cannula History of Recent Intubation: Yes Length of Intubations (days): 3 days Date extubated: 04/14/18 Behavior/Cognition: Alert;Cooperative;Pleasant mood;Confused Oral Cavity Assessment: Within Functional Limits Oral Care Completed by SLP: Yes Oral Cavity - Dentition: Edentulous Patient Positioning: Upright in bed;Postural control adequate for testing Baseline Vocal Quality: Normal Volitional Swallow: Able to elicit    Oral/Motor/Sensory Function Overall Oral Motor/Sensory Function: Within functional limits   Ice Chips Ice chips: Impaired Presentation: Spoon Pharyngeal Phase Impairments: Throat Clearing - Immediate   Thin Liquid Thin Liquid: Impaired Presentation: Spoon;Straw Pharyngeal  Phase Impairments: Cough - Immediate;Throat Clearing - Immediate Other Comments: Throat clear via spoon and cough via straw     Nectar Thick Nectar Thick Liquid: Not tested   Honey Thick Honey Thick Liquid: Not tested   Puree Puree: Impaired Presentation: Spoon Pharyngeal Phase Impairments: Throat Clearing - Immediate(Inconsistent)   Solid    Lama Narayanan I. Vear Clock, MS, CCC-SLP Acute Rehabilitation Services Office number [phone] Pager [phone] Solid: Not tested      Scheryl Marten 04/15/2018,9:03 AM

## 2018-04-16 ENCOUNTER — Inpatient Hospital Stay (HOSPITAL_COMMUNITY)
Admission: RE | Admit: 2018-04-16 | Discharge: 2018-04-25 | DRG: 945 | Disposition: A | Discharge status: Home Health | Payer: Medicare Other | Source: Intra-hospital | Attending: Physical Medicine & Rehabilitation | Admitting: Physical Medicine & Rehabilitation

## 2018-04-16 DIAGNOSIS — Z298 Encounter for other specified prophylactic measures: Secondary | ICD-10-CM

## 2018-04-16 DIAGNOSIS — Z89511 Acquired absence of right leg below knee: Secondary | ICD-10-CM | POA: Diagnosis not present

## 2018-04-16 DIAGNOSIS — Z7902 Long term (current) use of antithrombotics/antiplatelets: Secondary | ICD-10-CM

## 2018-04-16 DIAGNOSIS — N401 Enlarged prostate with lower urinary tract symptoms: Secondary | ICD-10-CM | POA: Diagnosis present

## 2018-04-16 DIAGNOSIS — G9349 Other encephalopathy: Secondary | ICD-10-CM | POA: Diagnosis present

## 2018-04-16 DIAGNOSIS — Z79890 Hormone replacement therapy (postmenopausal): Secondary | ICD-10-CM | POA: Diagnosis not present

## 2018-04-16 DIAGNOSIS — R4587 Impulsiveness: Secondary | ICD-10-CM | POA: Diagnosis not present

## 2018-04-16 DIAGNOSIS — I272 Pulmonary hypertension, unspecified: Secondary | ICD-10-CM | POA: Diagnosis present

## 2018-04-16 DIAGNOSIS — E7800 Pure hypercholesterolemia, unspecified: Secondary | ICD-10-CM | POA: Diagnosis present

## 2018-04-16 DIAGNOSIS — Z79899 Other long term (current) drug therapy: Secondary | ICD-10-CM | POA: Diagnosis not present

## 2018-04-16 DIAGNOSIS — R1310 Dysphagia, unspecified: Secondary | ICD-10-CM

## 2018-04-16 DIAGNOSIS — Z7982 Long term (current) use of aspirin: Secondary | ICD-10-CM | POA: Diagnosis not present

## 2018-04-16 DIAGNOSIS — Z953 Presence of xenogenic heart valve: Secondary | ICD-10-CM | POA: Diagnosis not present

## 2018-04-16 DIAGNOSIS — E7849 Other hyperlipidemia: Secondary | ICD-10-CM | POA: Diagnosis not present

## 2018-04-16 DIAGNOSIS — S065XAA Traumatic subdural hemorrhage with loss of consciousness status unknown, initial encounter: Secondary | ICD-10-CM | POA: Diagnosis present

## 2018-04-16 DIAGNOSIS — I480 Paroxysmal atrial fibrillation: Secondary | ICD-10-CM | POA: Diagnosis present

## 2018-04-16 DIAGNOSIS — I10 Essential (primary) hypertension: Secondary | ICD-10-CM | POA: Diagnosis present

## 2018-04-16 DIAGNOSIS — R5381 Other malaise: Principal | ICD-10-CM | POA: Diagnosis present

## 2018-04-16 DIAGNOSIS — I350 Nonrheumatic aortic (valve) stenosis: Secondary | ICD-10-CM | POA: Diagnosis present

## 2018-04-16 DIAGNOSIS — S065X9A Traumatic subdural hemorrhage with loss of consciousness of unspecified duration, initial encounter: Secondary | ICD-10-CM | POA: Diagnosis not present

## 2018-04-16 DIAGNOSIS — E039 Hypothyroidism, unspecified: Secondary | ICD-10-CM | POA: Diagnosis present

## 2018-04-16 DIAGNOSIS — E785 Hyperlipidemia, unspecified: Secondary | ICD-10-CM | POA: Diagnosis present

## 2018-04-16 DIAGNOSIS — D638 Anemia in other chronic diseases classified elsewhere: Secondary | ICD-10-CM | POA: Diagnosis present

## 2018-04-16 DIAGNOSIS — I4891 Unspecified atrial fibrillation: Secondary | ICD-10-CM | POA: Diagnosis present

## 2018-04-16 DIAGNOSIS — S065X9S Traumatic subdural hemorrhage with loss of consciousness of unspecified duration, sequela: Secondary | ICD-10-CM

## 2018-04-16 DIAGNOSIS — M792 Neuralgia and neuritis, unspecified: Secondary | ICD-10-CM

## 2018-04-16 DIAGNOSIS — G40909 Epilepsy, unspecified, not intractable, without status epilepticus: Secondary | ICD-10-CM

## 2018-04-16 DIAGNOSIS — N400 Enlarged prostate without lower urinary tract symptoms: Secondary | ICD-10-CM

## 2018-04-16 DIAGNOSIS — Z2989 Encounter for other specified prophylactic measures: Secondary | ICD-10-CM

## 2018-04-16 LAB — GLUCOSE, CAPILLARY
Glucose-Capillary: 109 mg/dL — ABNORMAL HIGH (ref 70–99)
Glucose-Capillary: 93 mg/dL (ref 70–99)
Glucose-Capillary: 108 mg/dL — ABNORMAL HIGH (ref 70–99)

## 2018-04-16 LAB — BASIC METABOLIC PANEL
Anion gap: 13 (ref 5–15)
BUN: 16 mg/dL (ref 8–23)
CO2: 23 mmol/L (ref 22–32)
Calcium: 8.6 mg/dL — ABNORMAL LOW (ref 8.9–10.3)
Chloride: 103 mmol/L (ref 98–111)
Creatinine, Ser: 0.97 mg/dL (ref 0.61–1.24)
GFR calc Af Amer: >60 mL/min (ref >60)
GFR calc non Af Amer: >60 mL/min (ref >60)
Glucose, Bld: 107 mg/dL — ABNORMAL HIGH (ref 70–99)
Potassium: 3 mmol/L — ABNORMAL LOW (ref 3.5–5.1)
Sodium: 139 mmol/L (ref 135–145)

## 2018-04-16 LAB — CBC
HEMATOCRIT: 31.6 % — AB (ref 39.0–52.0)
Hemoglobin: 9.6 g/dL — ABNORMAL LOW (ref 13.0–17.0)
MCH: 21.3 pg — ABNORMAL LOW (ref 26.0–34.0)
MCHC: 30.4 g/dL (ref 30.0–36.0)
MCV: 70.1 fL — ABNORMAL LOW (ref 80.0–100.0)
Platelets: 219 10*3/uL (ref 150–400)
RBC: 4.51 MIL/uL (ref 4.22–5.81)
RDW: 15.6 % — ABNORMAL HIGH (ref 11.5–15.5)
WBC: 8.8 10*3/uL (ref 4.0–10.5)
nRBC: 0 % (ref 0.0–0.2)

## 2018-04-16 LAB — CREATININE, SERUM
Creatinine, Ser: 0.89 mg/dL (ref 0.61–1.24)
GFR calc Af Amer: >60 mL/min (ref >60)

## 2018-04-16 MED ORDER — ACETAMINOPHEN 325 MG PO TABS: 650.0000 mg | ORAL_TABLET | Freq: Four times a day (QID) | ORAL | Status: DC | PRN

## 2018-04-16 MED ORDER — CLOPIDOGREL BISULFATE 75 MG PO TABS
75.0000 mg | ORAL_TABLET | Freq: Every day | ORAL | Status: DC
  Administered 2018-04-17 – 2018-04-25 (×9): 75 mg via ORAL
  Filled 2018-04-16 (×9): qty 1

## 2018-04-16 MED ORDER — POTASSIUM CHLORIDE 20 MEQ PO PACK
40.0000 meq | PACK | Freq: Two times a day (BID) | ORAL | Status: DC
  Administered 2018-04-16: 40 meq via ORAL
  Filled 2018-04-16: qty 2

## 2018-04-16 MED ORDER — LEVETIRACETAM 500 MG PO TABS
500.0000 mg | ORAL_TABLET | Freq: Two times a day (BID) | ORAL | Status: DC
  Administered 2018-04-16 – 2018-04-23 (×15): 500 mg via ORAL
  Filled 2018-04-16 (×16): qty 1

## 2018-04-16 MED ORDER — ENOXAPARIN SODIUM 40 MG/0.4ML ~~LOC~~ SOLN
40.0000 mg | SUBCUTANEOUS | Status: DC
  Administered 2018-04-17 – 2018-04-24 (×8): 40 mg via SUBCUTANEOUS
  Filled 2018-04-16 (×8): qty 0.4

## 2018-04-16 MED ORDER — GABAPENTIN 300 MG PO CAPS
300.0000 mg | ORAL_CAPSULE | Freq: Two times a day (BID) | ORAL | Status: DC
  Administered 2018-04-16: 300 mg via ORAL
  Filled 2018-04-16: qty 1

## 2018-04-16 MED ORDER — LEVOTHYROXINE SODIUM 25 MCG PO TABS
50.0000 ug | ORAL_TABLET | Freq: Every day | ORAL | Status: DC
  Administered 2018-04-17 – 2018-04-25 (×9): 50 ug via ORAL
  Filled 2018-04-16 (×9): qty 2

## 2018-04-16 MED ORDER — ADULT MULTIVITAMIN W/MINERALS CH
1.0000 | ORAL_TABLET | Freq: Every day | ORAL | Status: DC
  Administered 2018-04-17 – 2018-04-25 (×9): 1 via ORAL
  Filled 2018-04-16 (×9): qty 1

## 2018-04-16 MED ORDER — DILTIAZEM HCL 60 MG PO TABS
30.0000 mg | ORAL_TABLET | Freq: Four times a day (QID) | ORAL | Status: DC
  Administered 2018-04-16 – 2018-04-25 (×35): 30 mg via ORAL
  Filled 2018-04-16 (×35): qty 1

## 2018-04-16 MED ORDER — ENOXAPARIN SODIUM 40 MG/0.4ML ~~LOC~~ SOLN: 40.0000 mg | SUBCUTANEOUS | Status: DC

## 2018-04-16 MED ORDER — SIMVASTATIN 5 MG PO TABS
5.0000 mg | ORAL_TABLET | Freq: Every day | ORAL | Status: DC
  Administered 2018-04-16 – 2018-04-24 (×9): 5 mg via ORAL
  Filled 2018-04-16 (×9): qty 1

## 2018-04-16 MED ORDER — TAMSULOSIN HCL 0.4 MG PO CAPS
0.4000 mg | ORAL_CAPSULE | Freq: Every day | ORAL | Status: DC
  Administered 2018-04-16 – 2018-04-24 (×9): 0.4 mg via ORAL
  Filled 2018-04-16 (×9): qty 1

## 2018-04-16 MED ORDER — POTASSIUM CHLORIDE 20 MEQ PO PACK
40.0000 meq | PACK | Freq: Two times a day (BID) | ORAL | Status: AC
  Administered 2018-04-16 – 2018-04-17 (×2): 40 meq via ORAL
  Filled 2018-04-16 (×2): qty 2

## 2018-04-16 MED ORDER — VITAMIN C 500 MG PO TABS
500.0000 mg | ORAL_TABLET | Freq: Every day | ORAL | Status: DC
  Administered 2018-04-17 – 2018-04-25 (×9): 500 mg via ORAL
  Filled 2018-04-16 (×9): qty 1

## 2018-04-16 MED ORDER — GABAPENTIN 300 MG PO CAPS
300.0000 mg | ORAL_CAPSULE | Freq: Two times a day (BID) | ORAL | Status: DC
  Administered 2018-04-16 – 2018-04-25 (×18): 300 mg via ORAL
  Filled 2018-04-16 (×18): qty 1

## 2018-04-16 MED ORDER — SORBITOL 70 % SOLN
30.0000 mL | Freq: Every day | Status: DC | PRN
  Administered 2018-04-18 – 2018-04-25 (×3): 30 mL via ORAL
  Filled 2018-04-16 (×3): qty 30

## 2018-04-16 NOTE — Progress Notes (Addendum)
Inpatient Rehabilitation Admissions Coordinator  Received Inpatient Rehab consult. I met with patient and his son, Charles Patrick, at bedside for assessment. Patient alert and oriented. I discussed goals and expectations of an inpt rehab admit. Son and patient prefer an inpt rehab admit rather than SNF rehab. I will contact Dr. Delice Lesch to discuss assessment and to clarify when I can pursue admit to CIR.  Danne Baxter, RN, MSN Rehab Admissions Coordinator [phone] 04/16/2018 11:43 AM   Dr. Posey Pronto has given approval to admit pt to inpt rehab today. I contacted Dr. Lynetta Mare to make the arrangements to d/c to CIR today.RN CM and SW made aware.

## 2018-04-16 NOTE — Progress Notes (Signed)
  Speech Language Pathology Treatment: Dysphagia  Patient Details Name: Charles Patrick MRN: [id number] DOB: December 04, 1924 Today's Date: 04/16/2018 Time: 0810-0825 SLP Time Calculation (min) (ACUTE ONLY): 15 min  Assessment / Plan / Recommendation Clinical Impression  Pt was seen during breakfast for dysphagia treatment to assess tolerance of the recommended diet and facilitate his consistent use of compensatory strategies. He tolerated puree solids and nectar thick liquids without overt s/sx of aspiration but he needed consistent cues to reduce bolus size with liquids and to implement chin tuck posture. SLP will continue to follow pt.    HPI HPI: Pt is a 85 y.o male found with altered mental status at home. Intubated in the ER on 2/14. The CT of the head of 04/11/18 revealed bilateral primarily low-density subdural collection along the cerebral convexities measuring up to 9 mm in thickness. EEG was negative. Chest x-ray of 04/13/18 showed streaky opacities within the right mid lung region and at the right lung base suggesting pneumonia versus asymmetric edema. Pt was extubated on 04/14/18.       SLP Plan  Continue with current plan of care       Recommendations  Diet recommendations: Dysphagia 1 (puree);Thin liquid Liquids provided via: Cup Medication Administration: Crushed with puree Supervision: Full supervision/cueing for compensatory strategies Compensations: Slow rate;Small sips/bites;Multiple dry swallows after each bite/sip;Chin tuck;Follow solids with liquid Postural Changes and/or Swallow Maneuvers: Chin tuck;Upright 30-60 min after meal;Seated upright 90 degrees                Oral Care Recommendations: Oral care BID Follow up Recommendations: Inpatient Rehab SLP Visit Diagnosis: Dysphagia, pharyngeal phase (R13.13) Plan: Continue with current plan of care       Terez Montee I. Vear Clock, MS, CCC-SLP Acute Rehabilitation Services Office number [phone] Pager  [phone]                Scheryl Marten 04/16/2018, 8:48 AM

## 2018-04-16 NOTE — Progress Notes (Signed)
NAME:  Charles Patrick, MRN:  [id number], DOB:  1924/04/23, LOS: 5 ADMISSION DATE:  04/11/2018, CONSULTATION DATE: February 14 REFERRING MD: Dr. Nicanor Alcon, CHIEF COMPLAINT: Altered mental status, subdural hematoma  Brief History   85 year old male unresponsive at home with seizure history also found to have bilateral acute/subacute subdural hematoma.  Intubated in the emergency department for airway protection, admitted to ICU.  NSGY felt no surgical intervention required.      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  Significant Hospital Events   2/14 Admit with AMS, [facility] 2/15 Failed SBT with apnea  Consults:  Neurology Neurosurgery  Procedures:  ETT 2/14 >2/17  Significant Diagnostic Tests:  CT head 2/14 > Bilateral primarily low-density subdural collection along the cerebral convexities measuring up to 9 mm in thickness. EEG 2/15 >> negative  Pharyngeal function study 2/18 >> moderate to severe dysphagia. Dysphagia 1 diet recommended.  Micro Data:  N/A  Antimicrobials:  N/A  Interim history/subjective:  Extubated yesterday. Periods of confusion. Started on amiodarone for rate control of atrial fibrillation.   Objective   Blood pressure (!) 117/93, pulse (!) 59, temperature 97.9 F (36.6 C), temperature source Oral, resp. rate 16, height 6\' 2"  (1.88 m), weight 77.5 kg, SpO2 98 %.    FiO2 (%):  [3 %] 3 %   Intake/Output Summary (Last 24 hours) at 04/16/2018 1321 Last data filed at  04/16/2018 1200 Gross per 24 hour  Intake 936.69 ml  Output 550 ml  Net 386.69 ml   Filed Weights   04/12/18 0500 04/15/18 0400 04/16/18 0500  Weight: 76.5 kg 74.8 kg 77.5 kg    Examination: General: elderly male sitting in bed calmly HEENT: MM pink/moist, ETT Neuro: awake and interactive.  Follows commands in all limbs with no focal deficits. CV: s1s2 rrr, no m/r/g PULM: even/non-labored, lungs bilaterally coarse  ZL:DJTT, non-tender, bsx4 active  Extremities: warm/dry, no edema, R BKA  Skin: no rashes or lesions  Resolved Hospital Problem list   Respiratory failure  Assessment & Plan:   Was critically ill due to acute respiratory failure due to seizures and encephalopathy Bilateral subdural hematomas, small in size not amenable to intervention Acute encephalopathy, possibly postictal state now resolved Hypothyroidism Status post right BKA Rapid atrial fibrillation now well controlled on oral diltiazem Dysphagia - Dysphagia 1 diet recommended. Urinary retention - now back on Flomax.  PLAN:  Transfer to inpatient rehabilitation Continue current therapy for seizures and atrial fibrillation.  Was not on anticoagulation previously for Afib. He is far enough out from SDH to safely start anticoagulation for CVA, but this will need to be carefully balanced against risk of falls once rehabilitation has been completed.   Best practice:  Diet: dysphagia 1 diet. Pain/Anxiety/Delirium protocol (if indicated): none required VAP protocol (if indicated): Yes DVT prophylaxis: Unfractionated heparin GI prophylaxis: none Glucose control: Monitoring Mobility: For CIR Code Status: Full Code   Family Communication: No family bedside am 2/18 Disposition: Ready for transfer - order reconciliation complete.  Labs   CBC: Recent Labs  Lab 04/11/18 0515 04/11/18 0555 04/12/18 0520 04/12/18 1127 04/13/18 0414 04/14/18 0811  WBC 7.3  --   --  11.9* 15.2* 15.5*  NEUTROABS 4.1  --    --   --   --   --   HGB 10.3* 11.6* 10.2* 10.6* 10.4* 10.8*  HCT 35.4* 34.0* 30.0* 34.4* 34.1* 35.3*  MCV 72.5*  --   --  70.9* 70.7* 70.5*  PLT 182  --   --  165 177 146*    Basic Metabolic Panel: Recent Labs  Lab 04/12/18 0723 04/13/18 0414 04/14/18 0811 04/14/18 1905 04/15/18 0439 04/16/18 0501  NA 138 137 135  --  136 139  K 4.1 3.4* 3.4*  --  3.5 3.0*  CL 110 106 104  --  104 103  CO2 19* 22 20*  --  22 23  GLUCOSE 88 118* 133*  --  113* 107*  BUN 10 12 18   --  16 16  CREATININE 1.24 1.03 1.12  --  1.10 0.97  CALCIUM 8.8* 8.5* 8.3*  --  8.5* 8.6*  MG 1.9 2.1  --  2.0 2.1  --   PHOS 2.5 3.7  --   --   --   --    GFR: Estimated Creatinine Clearance: 54.4 mL/min (by C-G formula based on SCr of 0.97 mg/dL). Recent Labs  Lab 04/11/18 0515 04/12/18 1127 04/13/18 0414 04/14/18 0811  WBC 7.3 11.9* 15.2* 15.5*    Liver Function Tests: Recent Labs  Lab 04/11/18 0515 04/14/18 1905  AST 16 23  ALT 10 13  ALKPHOS 57 67  BILITOT 0.4 0.9  PROT 6.4* 7.0  ALBUMIN 3.2* 2.9*   No results for input(s): LIPASE, AMYLASE in the last 168 hours. No results for input(s): AMMONIA in the last 168 hours.  ABG    Component Value Date/Time   PHART 7.568 (H) 04/12/2018 0520   PCO2ART 22.3 (L) 04/12/2018 0520   PO2ART 102.0 04/12/2018 0520   HCO3 20.3 04/12/2018 0520   TCO2 21 (L) 04/12/2018 0520   ACIDBASEDEF 1.0 04/12/2018 0520   O2SAT 99.0 04/12/2018 0520     Coagulation Profile: Recent Labs  Lab 04/11/18 0515  INR 1.12    Cardiac Enzymes: No results for input(s): CKTOTAL, CKMB, CKMBINDEX, TROPONINI in the last 168 hours.  HbA1C: Hgb A1c MFr Bld  Date/Time Value Ref Range Status  01/18/2017 11:32 AM 5.6 4.8 - 5.6 % Final    Comment:    (NOTE) Pre diabetes:          5.7%-6.4% Diabetes:              >6.4% Glycemic control for   <7.0% adults with diabetes     CBG: Recent Labs  Lab 04/15/18 1958 04/15/18 2352 04/16/18 0339 04/16/18 0758 04/16/18 1142    GLUCAP 117* 101* 109* 93 108*    35 min spent with >50% in counseling and coordination of care.   Lynnell Catalan, MD [facility] ICU Physician [facility]  Pager: [phone] Mobile: [phone] After hours: [phone].  04/16/2018, 1:21 PM

## 2018-04-16 NOTE — PMR Pre-admission (Signed)
PMR Admission Coordinator Pre-Admission Assessment  Patient: Charles Patrick is an 85 y.o., male MRN: [id number] DOB: [birth year] Height: '6\' 2"'$  (188 cm)(per chart review prior to BKA) Weight: 77.5 kg  Insurance Information HMO:     PPO:      PCP:      IPA:      80/20:      OTHER: no HMO PRIMARY: Medicare a and b      Policy#: [id number]      Subscriber: pt Benefits:  Phone #: passport one online     Name: 04/16/2018 Eff. Date: 01/27/1992     Deduct: $1408      Out of Pocket Max: none      Life Max: none CIR: 100%      SNF: 20 full days Outpatient: 80%     Co-Pay: 20% Home Health: 100%      Co-Pay: none DME: 80%     Co-Pay: 20% Providers: pt choice  SECONDARY: none       Medicaid Application Date:       Case Manager:  Disability Application Date:       Case Worker:   Emergency Contact Information Contact Information    Name Relation Home Work Mobile   [place] Son   [phone]   Vikas, Wegmann   [phone]   Eleuterio, Dollar Spouse   [phone]      Current Medical History  Patient Admitting Diagnosis: bilateral acute/subacute subdural hematoma  History of Present Illness: WYMAN MESCHKE is a 85 year old right-handed male with history of chronic anemia,blindness right eye, aortic valve stenosis,  transcatheter aortic valve replacement 01/22/2017 maintained on aspirin and Plavix, hypertension, as well as a complex open right distal tibia and fibula fracture requiring external fixator with irrigation and debridement as well as removal of bone and tendon with ongoing wound care ischemic changes and underwent right below-knee amputation 08/19/2017 per Dr. Marcelino Scot and was discharged to skilled nursing facility after being denied inpatient rehabilitation services by insurance.   Presented 04/11/2018 after being found unresponsive in the bed by family. At the request of 911 he was lowered to the floor by family. Family denies any recent trauma. Cranial CT scan showed bilateral  primarily low density subdural collection along the cerebral convexities measuring up to 9 mm in thickness. No significant cortical mass effect or midline shift. No evidence for infarct. Troponin negative. Neurosurgery Dr. Dierdre Harness consulted and advised conservative care. Patient did require intubation for airway protection. Follow-up cranial CT scan remained stable. EEG negative for seizure. He was extubated 04/14/2018. Currently maintained on Keppra for seizure prophylaxis. Subcutaneous Lovenox for DVT prophylaxis 04/15/2018. Patient did develop run of atrial fibrillation initially on amiodarone transition to Cardizem. Hypokalemia with supplement added. Mild leukocytosis 15,000. Foley catheter tube was removed 04/16/2018. Dysphagia 1 nectar thick liquids.   Patient's medical record from [facility] has been reviewed by the rehabilitation admission coordinator and physician.  NIH Stroke scale: 9  Past Medical History  Past Medical History:  Diagnosis Date  . Anemia   . Aortic valve stenosis   . Arthropathy   . BPH associated with nocturia   . Carotid artery disease ([place])   . Colon polyp   . Dilated aortic root ([place])   . Elevated PSA   . Erectile dysfunction   . Gastric paresis   . Hypercholesterolemia   . Hyperglycemia   . Hypertension    Patient denies  . Hypothyroid   . Iron deficiency   .  Pulmonary hypertension ([place])   . 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 Seizure disorder ([place])   . Thrombocytopenia ([place])   . Thyroid nodule   . Tricuspid regurgitation   . Vitamin D deficiency     Family History   family history includes Healthy in his father and mother.  Prior Rehab/Hospitalizations Has the patient had major surgery during 100 days prior to admission? No  [facility] Medicare denied CIR  admission)  Current Medications  Current Facility-Administered Medications:  .  acetaminophen (TYLENOL) tablet 650 mg, 650 mg, Oral, Q6H PRN, Agarwala, Ravi, MD .  chlorhexidine gluconate (MEDLINE KIT) (PERIDEX) 0.12 % solution 15 mL, 15 mL, Mouth Rinse, BID, Margaretha Seeds, MD, 15 mL at 04/16/18 0800 .  clopidogrel (PLAVIX) tablet 75 mg, 75 mg, Oral, Q breakfast, Agarwala, Ravi, MD, 75 mg at 04/16/18 0830 .  diltiazem (CARDIZEM) tablet 30 mg, 30 mg, Oral, Q6H, Agarwala, Ravi, MD, 30 mg at 04/16/18 1155 .  docusate sodium (COLACE) capsule 100 mg, 100 mg, Oral, BID, Agarwala, Ravi, MD, 100 mg at 04/16/18 0908 .  enoxaparin (LOVENOX) injection 40 mg, 40 mg, Subcutaneous, Q24H, Agarwala, Ravi, MD, 40 mg at 04/15/18 1733 .  gabapentin (NEURONTIN) capsule 300 mg, 300 mg, Oral, BID, Agarwala, Ravi, MD, 300 mg at 04/16/18 1049 .  levETIRAcetam (KEPPRA) tablet 500 mg, 500 mg, Oral, BID, Agarwala, Ravi, MD, 500 mg at 04/16/18 0908 .  levothyroxine (SYNTHROID, LEVOTHROID) tablet 50 mcg, 50 mcg, Oral, Q0600, Kipp Brood, MD, 50 mcg at 04/16/18 0544 .  multivitamin with minerals tablet 1 tablet, 1 tablet, Oral, Daily, Kipp Brood, MD, 1 tablet at 04/16/18 0909 .  simvastatin (ZOCOR) tablet 5 mg, 5 mg, Oral, q1800, Agarwala, Ravi, MD, 5 mg at 04/15/18 1733 .  tamsulosin (FLOMAX) capsule 0.4 mg, 0.4 mg, Oral, QPC supper, Agarwala, Ravi, MD, 0.4 mg at 04/15/18 1733 .  vitamin C (ASCORBIC ACID) tablet 500 mg, 500 mg, Oral, Daily, Agarwala, Ravi, MD, 500 mg at 04/16/18 0909  Patients Current Diet:   Diet Order            DIET - DYS 1 Room service appropriate? Yes; Fluid consistency: Nectar Thick  Diet effective now              Precautions / Restrictions Precautions Precautions: Fall Precaution Comments: blind right eye, prosthesis (R BKA) Restrictions Weight Bearing Restrictions: No   Has the patient had 2 or more falls or a fall with injury in the past year?No  Prior Activity  Level Community (5-7x/wk): Mod I with RW and RLE prosthesis; active; also uses his wheelchair as needed  Prior Functional Level Do you want Prior Function Level of Independence: Independent with assistive device(s) Comments: Ludwig Clarks confirms pt is Mod I with RW and prosthesiss from other? Self Care: Did the patient need help bathing, dressing, using the toilet or eating?  Independent  Indoor Mobility: Did the patient need assistance with walking from room to room (with or without device)? Independent  Stairs: Did the patient need assistance with internal or external stairs (with or without device)? Independent  Functional Cognition: Did the patient need help planning regular tasks such as shopping or remembering to take medications? Independent  Home Assistive Devices / Equipment Home Assistive Devices/Equipment: Prosthesis, Environmental consultant (specify type), Wheelchair Home Equipment: Walker - 2 wheels  Prior Device Use: Indicate  devices/aids used by the patient prior to current illness, exacerbation or injury? RLE prosthesis and RW as well as wheelchair as needed  Prior Functional Level Comments: Ludwig Clarks confirms pt is Mod I with RW and prosthesiss   Prior Functional Level Current Functional Level  Bed Mobility  Independent Needs Assistance; supine to sit min guard  Transfers  Mod I with Prosthesis and RW   Mod assist to stand with posterior lean and assist for balance and rising from surface  Mobility - Walk/Wheelchair  Mod I prosthesis and RW    Mobility - Ambulation/Gait  Mod I  Mod assist, +2 safety/equipment, +2 physical assistance; 100 feet with RW on 2/18  Upper Body Dressing  Independent Set up, Sitting  Lower Body Dressing  Independent Moderate assistance, Sit to/from stand, +2 for physical assistance, +2 for safety/equipment  Grooming  Independent Set up, Sitting  Eating/Drinking  Independent   Set up  Toilet Transfer  Independent Moderate assistance, +2 for safety/equipment, +2 for  physical assistance, Ambulation, RW  Bladder Continence  Continent   foley inserted 2/14 and to be removed 2/19  Bowel Management   Continent   no LBM documented; continent  Stair Climbing  Only goes up one to 2 steps   not attempted  Communication  Independent No difficulties  Memory  Intact Decreased short-term memory  Cooking/Meal Prep   Independent      Housework   Independent   Money Management   Sons assist   Driving   No     Special needs/care consideration BiPAP/CPAP n/a CPM n/a Continuous Drip IV n/a Dialysis n/a Life Vest n/a Oxygen O2 at 2 liters nasal cannula; none pta Special Bed n/a Trach Size n/a Wound Vac n/a Skin old right BKA Bowel mgmt: no LBM documented Bladder mgmt: #16 french inserted 2/14; to be discontinued 2/19 Diabetic mgmt n/a  Previous Home Environment Living Arrangements: Spouse/significant other(Lives with 42 year old wife, Burman Nieves and adult son, Parthiv)  Lives With: Spouse, Son(Maggie and Shanon Brow) Available Help at Discharge: Family, Available 24 hours/day Type of Home: House Home Layout: One level Home Access: Stairs to enter [employer] of Steps: 1 Bathroom Shower/Tub: Chiropodist: Handicapped height Bathroom Accessibility: Yes How Accessible: Accessible via walker, Accessible via wheelchair [place]: No Additional Comments: glasses   Discharge Living Setting Plans for Discharge Living Setting: Patient's home, Lives with (comment)(wife, Burman Nieves and son, Pryce) Type of Home at Discharge: House Discharge Home Layout: One level Discharge Home Access: Stairs to enter Entrance Stairs-Rails: None Entrance Stairs-Number of Steps: 1 Discharge Bathroom Shower/Tub: Tub/shower unit Discharge Bathroom Toilet: Handicapped height Discharge Bathroom Accessibility: Yes How Accessible: Accessible via wheelchair, Accessible via walker Does the patient have any problems obtaining your medications?: (meds from  the [place])  Social/Family/Support Systems Patient Roles: Spouse, Parent Contact Information: Ludwig Clarks, son is main contact Anticipated Caregiver: wife and sons Anticipated Caregiver's Contact Information: Ludwig Clarks [phone] Ability/Limitations of Caregiver: Abdimalik drives long distance truck; Ludwig Clarks is Radiation protection practitioner; wife is 108 years old Caregiver Availability: 24/7 Discharge Plan Discussed with Primary Caregiver: Yes Is Caregiver In Agreement with Plan?: Yes Does Caregiver/Family have Issues with Lodging/Transportation while Pt is in Rehab?: No  Goals/Additional Needs Patient/Family Goal for Rehab: Mod I to supervision with PT, OT, and SLP Expected length of stay: ELOS 10 to 14 days Special Service Needs: Patietn blind right eye Pt/Family Agrees to Admission and willing to participate: Yes Program Orientation Provided & Reviewed with Pt/Caregiver Including Roles  & Responsibilities: Yes  Patient Condition: I have reviewed medical records from [facility], spoken with patient and son at bedside. I met with patient at the bedside for inpatient rehabilitation assessment.  Patient will benefit from ongoing PT, OT, and SLP, can actively participate in 3 hours of therapy a day 5 days of the week, and can make measurable gains during the admission.  Patient will also benefit from the coordinated team approach during an Inpatient Acute Rehabilitation admission.  The patient will receive intensive therapy as well as Rehabilitation physician, nursing, social worker, and care management interventions.  Due to bowel management, bladder management, safety, skin/wound care, disease management, medical administration, pain management, patient education the patient requires 24 hour a day rehabilitation nursing.  The patient is currently min to mod assist with mobility and basic ADLs.  Discharge setting and therapy post discharge at  home with home health is anticipated.  Patient has agreed to participate in the Acute  Inpatient Rehabilitation Program and will admit today.  Preadmission Screen Completed By:  Cleatrice Burke RN MSN, 04/16/2018 12:24 PM ______________________________________________________________________   Discussed status with Dr. Posey Pronto  on  04/16/2018  at  1315 and received telephone approval for admission today.  Admission Coordinator:  Cleatrice Burke RN MSN, time  0100 Date  04/16/2018   Assessment/Plan: Diagnosis: bilateral acute/subacute subdural hematoma 1. Does the need for close, 24 hr/day  Medical supervision in concert with the patient's rehab needs make it unreasonable for this patient to be served in a less intensive setting? Yes  2. Co-Morbidities requiring supervision/potential complications: chronic anemia, blindness right eye, aortic valve stenosis,  transcatheter aortic valve replacement, hypertension, right below-knee amputation  3. Due to safety, disease management and patient education, does the patient require 24 hr/day rehab nursing? Yes 4. Does the patient require coordinated care of a physician, rehab nurse, PT (1-2 hrs/day, 5 days/week) and OT (1-2 hrs/day, 5 days/week) to address physical and functional deficits in the context of the above medical diagnosis(es)? Yes Addressing deficits in the following areas: balance, endurance, locomotion, strength, transferring, bathing, dressing, toileting and psychosocial support 5. Can the patient actively participate in an intensive therapy program of at least 3 hrs of therapy 5 days a week? Yes 6. The potential for patient to make measurable gains while on inpatient rehab is excellent 7. Anticipated functional outcomes upon discharge from inpatients are: supervision and min assist PT, supervision and min assist OT, n/a SLP 8. Estimated rehab length of stay to reach the above functional goals is: 10-15 days. 9. Anticipated D/C setting: Home 10. Anticipated post D/C treatments: HH therapy and Home excercise  program 11. Overall Rehab/Functional Prognosis: good   Cleatrice Burke RN MSN Admissions Coordinator 04/16/2018  Delice Lesch, MD, ABPMR

## 2018-04-16 NOTE — Progress Notes (Signed)
Patient arrived and oriented to unit. No discomfort, call bell within reach.

## 2018-04-16 NOTE — H&P (Signed)
Physical Medicine and Rehabilitation Admission H&P    Chief Complaint  Patient presents with  . 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 Carola FrostHandy and was discharged to skilled nursing facility after being denied inpatient rehabilitation services by insurance. History taken from chart review, patient, and family.  Presently patient lives with spouse. One level home with one step to entry. He was using a walker prior to admission as well as a prosthesis. Presented 04/11/2018 after being found unresponsive in the bed by family. At the request of 911 he was lowered to the floor by family. Family denies any recent trauma. Cranial CT scan reviewed, showing b/l SDH.  Per report, showed bilateral primarily low density subdural collection along the cerebral convexities measuring up to 9 mm in thickness. No significant cortical mass effect or midline shift. No evidence for infarct. Troponin negative. Neurosurgery Dr. Kerri PerchesJody Stern consulted and advised conservative care. Patient did require intubation for airway protection. Follow-up cranial CT scan remained stable. EEG negative for seizure. He was extubated 04/14/2018. Currently maintained on Keppra for seizure prophylaxis. Subcutaneous Lovenox for DVT prophylaxis 04/15/2018. Patient did develop run of atrial fibrillation initially on amiodarone transition to cardiac exam.Hypokalemia with supplement added. Mild leukocytosis 15,000. Foley catheter tube plan to be removed 04/16/2018. Dysphagia 1 nectar thick  liquids.Therapy evaluations completed with recommendations of physical medicine rehabilitation consult. Patient was admitted for a comprehensive rehabilitation program. Please see preadmission assessment as well.    Review of Systems  Constitutional: Negative for chills and fever.  HENT: Negative for hearing loss.   Eyes:       Blindness right eye  Respiratory: Negative for cough and shortness of breath.   Cardiovascular: Negative for chest pain.  Gastrointestinal: Positive for constipation. Negative for nausea and vomiting.  Genitourinary: Positive for urgency.  Musculoskeletal: Positive for joint pain and myalgias.  Skin: Negative for rash.  All other systems reviewed and are negative.  Past Medical History:  Diagnosis Date  . Anemia   . Aortic valve stenosis   . Arthropathy   . BPH associated with nocturia   . Carotid artery disease (HCC)   . Colon polyp   . Dilated aortic root (HCC)   . Elevated PSA   . Erectile dysfunction   . Gastric paresis   . Hypercholesterolemia   . Hyperglycemia   . Hypertension    Patient denies  . Hypothyroid   . Iron deficiency   . Pulmonary hypertension (HCC)   . 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 Seizure disorder (HCC)   . Thrombocytopenia (HCC)   . Thyroid nodule   . Tricuspid regurgitation   . Vitamin D deficiency    Past Surgical History:  Procedure Laterality Date  . AMPUTATION Right 08/19/2017   Procedure: AMPUTATION BELOW KNEE;  Surgeon: Myrene GalasHandy, [name], MD;  [facility];  Service: Orthopedics;  Laterality: Right;  . EXTERNAL FIXATION LEG Right 08/14/2017   Procedure: EXTERNAL FIXATION LEG;  Surgeon: Toni ArthursHewitt, John, MD;  [facility] OR;  Service: Orthopedics;  Laterality: Right;  . I&D EXTREMITY Right 08/16/2017   Procedure: IRRIGATION AND DEBRIDEMENT EXTREMITY;  Surgeon: Myrene Galas, MD;   Location: [facility] OR;  Service: Orthopedics;  Laterality: Right;  . INCISION AND DRAINAGE Right 08/14/2017   Procedure: IRRIGATION AND DEBRIDMENT RIGHT ANKLE, APPLICATION WOUND VAC;  Surgeon: Toni Arthurs, MD;  Location: [facility] OR;  Service: Orthopedics;  Laterality: Right;  . MULTIPLE EXTRACTIONS WITH ALVEOLOPLASTY N/A 10/17/2016   Procedure: Extraction of tooth #'s 4, 21,22,27,28,29 with alveoloplasty;  Surgeon: Charlynne Pander, DDS;  Location: [facility] OR;  Service: Oral Surgery;  Laterality: N/A;  . NO PAST SURGERIES    . RIGHT/LEFT HEART CATH AND CORONARY ANGIOGRAPHY N/A 01/11/2017   Procedure: RIGHT/LEFT HEART CATH AND CORONARY ANGIOGRAPHY;  Surgeon: Tonny Bollman, MD;  Location: [facility] INVASIVE CV LAB;  Service: Cardiovascular;  Laterality: N/A;  . TEE WITHOUT CARDIOVERSION N/A 01/22/2017   Procedure: TRANSESOPHAGEAL ECHOCARDIOGRAM (TEE);  Surgeon: Tonny Bollman, MD;  Location: [facility] OR;  Service: Open Heart Surgery;  Laterality: N/A;  . TRANSCATHETER AORTIC VALVE REPLACEMENT, TRANSFEMORAL N/A 01/22/2017   Procedure: TRANSCATHETER AORTIC VALVE REPLACEMENT, TRANSFEMORAL using Edwards 29 Sapien 3 Aortic Valve;  Surgeon: Tonny Bollman, MD;  Location: [facility] OR;  Service: Open Heart Surgery;  Laterality: N/A;   Family History  Problem Relation Age of Onset  . Healthy Mother   . Healthy Father    Social History:  reports that he has never smoked. He has never used smokeless tobacco. He reports that he does not drink alcohol or use drugs. Allergies: No Known Allergies Medications Prior to Admission  Medication Sig Dispense Refill  . acetaminophen (TYLENOL) 500 MG tablet Take 2 tablets (1,000 mg total) by mouth every 8 (eight) hours. 30 tablet 0  . aspirin EC 81 MG tablet Take 81 mg by mouth daily.     Marland Kitchen levothyroxine (SYNTHROID, LEVOTHROID) 50 MCG tablet Take 50 mcg by mouth daily before breakfast.     . Multiple Vitamin (MULTIVITAMIN) capsule Take 1 capsule by mouth daily.    . simvastatin (ZOCOR) 5 MG  tablet Take 5 mg by mouth daily at 6 PM.     . tamsulosin (FLOMAX) 0.4 MG CAPS capsule Take 0.4 mg by mouth daily after supper.     Marland Kitchen amoxicillin (AMOXIL) 500 MG tablet Take 4 tablets by mouth one hour prior to dental appointments and stomach or bladder procedures 4 tablet 3  . clopidogrel (PLAVIX) 75 MG tablet Take 1 tablet (75 mg total) by mouth daily with breakfast. (Patient not taking: Reported on 04/11/2018) 30 tablet 5  . docusate sodium (COLACE) 100 MG capsule Take 1 capsule (100 mg total) by mouth 2 (two) times daily. (Patient not taking: Reported on 04/11/2018) 10 capsule 0  . enoxaparin (LOVENOX) 40 MG/0.4ML injection Inject 0.4 mLs (40 mg total) into the skin daily for 7 days. 7 Syringe 0  . gabapentin (NEURONTIN) 300 MG capsule Take 1 capsule (300 mg total) by mouth 2 (two) times daily. (Patient not taking: Reported on 04/11/2018) 60 capsule 0  . oxyCODONE (OXY IR/ROXICODONE) 5 MG immediate release tablet Take 1-2 tablets (5-10 mg total) by mouth every 6 (six) hours as needed for moderate pain or severe pain (pain score 4-6). (Patient not taking: Reported on 04/11/2018) 30 tablet 0  . vitamin C (VITAMIN C) 500 MG tablet Take 1 tablet (500 mg total) by mouth daily. (Patient not taking: Reported on 04/11/2018) 30 tablet 1    Drug Regimen Review Drug  regimen was reviewed and remains appropriate with no significant issues identified  Home: Home Living Family/patient expects to be discharged to:: Private residence Living Arrangements: Spouse/significant other Available Help at Discharge: Family, Available 24 hours/day Type of Home: House Home Access: Stairs to enter Secretary/administrator of Steps: 1 Home Layout: One level Bathroom Shower/Tub: Engineer, manufacturing systems: Handicapped height Home Equipment: Environmental consultant - 2 wheels Additional Comments: glasses    Functional History: Prior Function Level of Independence: Independent with assistive device(s) Comments: pt reports  independent ADLs, using RW for mobility but no family present to confirm (inconsistent history, initally reporting not using any AD)  Functional Status:  Mobility: Bed Mobility Overal bed mobility: Needs Assistance Bed Mobility: Supine to Sit Supine to sit: Min guard General bed mobility comments: guarding for lines with HOB 20 degrees and pt able to achieve sitting without physical assist Transfers Overall transfer level: Needs assistance Equipment used: None Transfers: Sit to/from Stand Sit to Stand: Mod assist, +2 physical assistance General transfer comment: mod +2 assist to stand from bed with posterior lean and assist for balance and rising from surface, cueing for hand placement and safety Ambulation/Gait Ambulation/Gait assistance: Mod assist, +2 safety/equipment, +2 physical assistance Gait Distance (Feet): 100 Feet Assistive device: Rolling walker (2 wheeled) Gait Pattern/deviations: Step-to pattern, Narrow base of support, Trunk flexed General Gait Details: pt with flexed trunk, very narrow BOS, right lean with tendency to maintain self very posterior to Rw. Cues for posture, position in RW, safety and +2 for lines, safety and balance with directional cues Gait velocity interpretation: 1.31 - 2.62 ft/sec, indicative of limited community ambulator    ADL: ADL Overall ADL's : Needs assistance/impaired Grooming: Set up, Sitting Upper Body Bathing: Set up, Sitting Lower Body Bathing: Minimal assistance, +2 for safety/equipment, Sit to/from stand Upper Body Dressing : Set up, Sitting Lower Body Dressing: Moderate assistance, Sit to/from stand, +2 for physical assistance, +2 for safety/equipment Lower Body Dressing Details (indicate cue type and reason): able to don R prosthesis with increased time, min guard for forward lean for safety, mod assist +2 sit>stand  Toilet Transfer: Moderate assistance, +2 for safety/equipment, +2 for physical assistance, Ambulation, RW Toilet  Transfer Details (indicate cue type and reason): simulated to recliner  Functional mobility during ADLs: Minimal assistance, Moderate assistance, +2 for physical assistance, +2 for safety/equipment, Rolling walker, Cueing for safety General ADL Comments: patient limited by generalized weakness, cogniton and safety   Cognition: Cognition Overall Cognitive Status: Impaired/Different from baseline Orientation Level: Oriented to person, Oriented to place, Oriented to situation, Disoriented to time Cognition Arousal/Alertness: Awake/alert Behavior During Therapy: Flat affect Overall Cognitive Status: Impaired/Different from baseline Area of Impairment: Orientation, Attention, Memory, Problem solving, Safety/judgement, Awareness Orientation Level: Disoriented to, Time, Place, Situation Current Attention Level: Sustained Memory: Decreased short-term memory Safety/Judgement: Decreased awareness of safety, Decreased awareness of deficits Awareness: Emergent Problem Solving: Slow processing, Requires verbal cues General Comments: patient requires increased time and effort for all activities, requires re-orientation to place, year and situation (but able to recall at completion of session), decreased safety awareness noted with mobility   Physical Exam: Blood pressure 102/73, pulse (!) 57, temperature 97.8 F (36.6 C), temperature source Oral, resp. rate 15, height 6\' 2"  (1.88 m), weight 77.5 kg, SpO2 100 %. Physical Exam  Vitals reviewed. Constitutional: He appears well-developed and well-nourished.  HENT:  Head: Normocephalic and atraumatic.  Patient is blind in the right eye/opaque  Eyes: Right eye exhibits no discharge. Left eye exhibits no  discharge.  Opaque right eye  Neck: Neck supple.  Cardiovascular: Normal rate and regular rhythm.  Respiratory: Effort normal and breath sounds normal.  +Ronks  GI: Soft. Bowel sounds are normal.  Musculoskeletal:     Comments: No edema or tenderness  in extremities Right BKA  Neurological: He is alert.  Patient is alert sitting up in bed.  Makes eye contact with examiner.  Follows commands.  Fair awareness of deficits. Motor: Grossly 4+/5 throughout Alert and oriented, except for situation  Skin: Skin is warm and dry.  Right BKA is well healed  Psychiatric: He has a normal mood and affect. His behavior is normal.    Results for orders placed or performed during the hospital encounter of 04/11/18 (from the past 48 hour(s))  Glucose, capillary     Status: Abnormal   Collection Time: 04/14/18 11:36 AM  Result Value Ref Range   Glucose-Capillary 125 (H) 70 - 99 mg/dL   Comment 1 Notify RN    Comment 2 Document in Chart   Glucose, capillary     Status: Abnormal   Collection Time: 04/14/18  3:49 PM  Result Value Ref Range   Glucose-Capillary 106 (H) 70 - 99 mg/dL   Comment 1 Notify RN    Comment 2 Document in Chart   Magnesium     Status: None   Collection Time: 04/14/18  7:05 PM  Result Value Ref Range   Magnesium 2.0 1.7 - 2.4 mg/dL    Comment: Performed at [facility], [address] [place], [place] [address]  TSH     Status: Abnormal   Collection Time: 04/14/18  7:05 PM  Result Value Ref Range   TSH 5.772 (H) 0.350 - 4.500 uIU/mL    Comment: Performed by a 3rd Generation assay with a functional sensitivity of <=0.01 uIU/mL. Performed at [facility], [address] [place], [place] [address]   Digoxin level     Status: None   Collection Time: 04/14/18  7:05 PM  Result Value Ref Range   Digoxin Level 1.1 0.8 - 2.0 ng/mL    Comment: Performed at [facility], [address], [place], [place] [address]  Hepatic function panel     Status: Abnormal   Collection Time: 04/14/18  7:05 PM  Result Value Ref Range   Total Protein 7.0 6.5 - 8.1 g/dL   Albumin 2.9 (L) 3.5 - 5.0 g/dL   AST 23 15 - 41 U/L   ALT 13 0 - 44 U/L   Alkaline Phosphatase 67 38 - 126 U/L   Total Bilirubin 0.9 0.3 - 1.2 mg/dL    Bilirubin, Direct 0.3 (H) 0.0 - 0.2 mg/dL   Indirect Bilirubin 0.6 0.3 - 0.9 mg/dL    Comment: Performed at [facility], [address] [place], [place] [address]  Glucose, capillary     Status: Abnormal   Collection Time: 04/14/18  8:03 PM  Result Value Ref Range   Glucose-Capillary 127 (H) 70 - 99 mg/dL  Glucose, capillary     Status: Abnormal   Collection Time: 04/14/18 11:51 PM  Result Value Ref Range   Glucose-Capillary 111 (H) 70 - 99 mg/dL  Glucose, capillary     Status: None   Collection Time: 04/15/18  2:55 AM  Result Value Ref Range   Glucose-Capillary 90 70 - 99 mg/dL  Basic metabolic panel     Status: Abnormal   Collection Time: 04/15/18  4:39 AM  Result Value Ref Range   Sodium 136 135 - 145 mmol/L   Potassium 3.5 3.5 - 5.1 mmol/L   Chloride 104 98 - 111 mmol/L   CO2 22 22 - 32 mmol/L   Glucose, Bld 113 (H) 70 - 99 mg/dL   BUN 16 8 - 23 mg/dL   Creatinine, Ser 8.36 0.61 - 1.24 mg/dL   Calcium 8.5 (L) 8.9 - 10.3 mg/dL   GFR calc non Af Amer 58 (L) >60 mL/min   GFR calc Af Amer >60 >60 mL/min   Anion gap 10 5 - 15    Comment: Performed at [facility], [address], [place], [place] [address]  Magnesium     Status: None   Collection Time: 04/15/18  4:39 AM  Result Value Ref Range   Magnesium 2.1 1.7 - 2.4 mg/dL    Comment: Performed at [facility], [address] [place], [place] [address]  Glucose, capillary     Status: Abnormal   Collection Time: 04/15/18  8:06 AM  Result Value Ref Range   Glucose-Capillary 102 (H) 70 - 99 mg/dL   Comment 1 Notify RN    Comment 2 Document in Chart   Glucose, capillary     Status: None   Collection Time: 04/15/18 11:50 AM  Result Value Ref Range   Glucose-Capillary 98 70 - 99 mg/dL   Comment 1 Notify RN    Comment 2 Document in Chart   Glucose, capillary     Status: None   Collection Time: 04/15/18  3:43 PM  Result Value Ref Range   Glucose-Capillary 95 70 - 99 mg/dL   Comment 1 Notify RN     Comment 2 Document in Chart   Glucose, capillary     Status: Abnormal   Collection Time: 04/15/18  7:58 PM  Result Value Ref Range   Glucose-Capillary 117 (H) 70 - 99 mg/dL  Glucose, capillary     Status: Abnormal   Collection Time: 04/15/18 11:52 PM  Result Value Ref Range   Glucose-Capillary 101 (H) 70 - 99 mg/dL  Glucose, capillary     Status: Abnormal   Collection Time: 04/16/18  3:39 AM  Result Value Ref Range   Glucose-Capillary 109 (H) 70 - 99 mg/dL  Basic metabolic panel     Status: Abnormal   Collection Time: 04/16/18  5:01 AM  Result Value Ref Range   Sodium 139 135 - 145 mmol/L   Potassium 3.0 (L) 3.5 - 5.1 mmol/L   Chloride 103 98 - 111 mmol/L   CO2 23 22 - 32 mmol/L   Glucose, Bld 107 (H) 70 - 99 mg/dL   BUN 16 8 - 23 mg/dL   Creatinine, Ser 0.35 0.61 - 1.24 mg/dL   Calcium 8.6 (L) 8.9 - 10.3 mg/dL   GFR calc non Af Amer >60 >60 mL/min   GFR calc Af Amer >60 >60 mL/min   Anion gap 13 5 - 15    Comment: Performed at [facility], [address], [place], [place] [address]  Glucose, capillary     Status: None   Collection Time: 04/16/18  7:58 AM  Result Value Ref Range   Glucose-Capillary 93 70 - 99 mg/dL   Dg Swallowing Func-speech Pathology  Result Date: 04/15/2018 Objective Swallowing Evaluation: Type of Study: MBS-Modified Barium Swallow Study  Patient Details Name: CRISTINA HENSEY MRN: [id number] Date of Birth: 04-28-1924 Today's Date: 04/15/2018 Time: SLP Start Time (ACUTE ONLY): 1402 -  SLP Stop Time (ACUTE ONLY): 1417 SLP Time Calculation (min) (ACUTE ONLY): 15 min Past Medical History: Past Medical History: Diagnosis Date . Anemia  . Aortic valve stenosis  . Arthropathy  . BPH associated with nocturia  . Carotid artery disease (HCC)  . Colon polyp  . Dilated aortic root (HCC)  . Elevated PSA  . Erectile dysfunction  . Gastric paresis  . Hypercholesterolemia  . Hyperglycemia  . Hypertension   Patient denies . Hypothyroid  . Iron deficiency  . Pulmonary  hypertension (HCC)  . 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 Seizure disorder (HCC)  . Thrombocytopenia (HCC)  . Thyroid nodule  . Tricuspid regurgitation  . Vitamin D deficiency  Past Surgical History: Past Surgical History: Procedure Laterality Date . AMPUTATION Right 08/19/2017  Procedure: AMPUTATION BELOW KNEE;  Surgeon: Myrene GalasHandy, [name], MD;  [facility];  Service: Orthopedics;  Laterality: Right; . EXTERNAL FIXATION LEG Right 08/14/2017  Procedure: EXTERNAL FIXATION LEG;  Surgeon: Toni ArthursHewitt, John, MD;  [facility]: [facility] OR;  Service: Orthopedics;  Laterality: Right; . I&D EXTREMITY Right 08/16/2017  Procedure: IRRIGATION AND DEBRIDEMENT EXTREMITY;  Surgeon: Myrene GalasHandy, [name], MD;  [facility];  Service: Orthopedics;  Laterality: Right; . INCISION AND DRAINAGE Right 08/14/2017  Procedure: IRRIGATION AND DEBRIDMENT RIGHT ANKLE, APPLICATION WOUND VAC;  Surgeon: Toni ArthursHewitt, John, MD;  Location: [facility] OR;  Service: Orthopedics;  Laterality: Right; . MULTIPLE EXTRACTIONS WITH ALVEOLOPLASTY N/A 10/17/2016  Procedure: Extraction of tooth #'s 4, 21,22,27,28,29 with alveoloplasty;  Surgeon: [name], [name], DDS;  [facility];  Service: Oral Surgery;  Laterality: N/A; . NO PAST SURGERIES   . RIGHT/LEFT HEART CATH AND CORONARY ANGIOGRAPHY N/A 01/11/2017  Procedure: RIGHT/LEFT HEART CATH AND CORONARY ANGIOGRAPHY;  Surgeon: [name], [name], MD;  [facility];  Service: Cardiovascular;  Laterality: N/A; . TEE WITHOUT CARDIOVERSION N/A 01/22/2017  Procedure: TRANSESOPHAGEAL ECHOCARDIOGRAM (TEE);  Surgeon: [name], [name], MD;  [facility];  Service: Open Heart Surgery;  Laterality: N/A; . TRANSCATHETER AORTIC VALVE REPLACEMENT, TRANSFEMORAL N/A 01/22/2017  Procedure: TRANSCATHETER AORTIC VALVE REPLACEMENT, TRANSFEMORAL using Edwards 29 Sapien 3 Aortic Valve;  Surgeon: [name], [name], MD;   [facility];  Service: Open Heart Surgery;  Laterality: N/A; HPI: Pt is a 85 y.o male found with altered mental status at home. Intubated in the ER on 2/14. The CT of the head of 04/11/18 revealed bilateral primarily low-density subdural collection along the cerebral convexities measuring up to 9 mm in thickness. EEG was negative. Chest x-ray of 04/13/18 showed streaky opacities within the right mid lung region and at the right lung base suggesting pneumonia versus asymmetric edema. Pt was extubated on 04/14/18.  No data recorded Assessment / Plan / Recommendation CHL IP CLINICAL IMPRESSIONS 04/15/2018 Clinical Impression Pt was seen in radiology suite for modified barium swallow study. He was seated upright in swallow chair for the study which was conducted in lateral view. Trials of puree, thin liquids, nectar thick liquids, and honey thick liquids were administered. He presents with moderate-severe pharyngeal phase dysphagia. He demonstrated reduced laryngeal excursion which resulted in incomplete epiglottic retroversion, pyriform sinus residue across all consistencies, and consistent penetration of liquids. Reduced lingual retraction resulted in moderate vallecular residue and mild-moderate pharyngeal wall residue was observed due to reduced pharyngeal constriction. No instances of aspiraton were observed but pt is at moderate risk for aspiration if swallowing precautions are not strictly observed. A dysphagia  1 diet with nectar thick liquids is recommended at this time with use of compensatory strategies to reduce aspiration risk. SLP will follow to ensure tolerance of the current diet, his ability to safely tolerate more advanced consistencies and the need for further intervention.  SLP Visit Diagnosis Dysphagia, pharyngeal phase (R13.13) Attention and concentration deficit following -- Frontal lobe and executive function deficit following -- Impact on safety and function Moderate aspiration risk   CHL IP  TREATMENT RECOMMENDATION 04/15/2018 Treatment Recommendations Therapy as outlined in treatment plan below   Prognosis 04/15/2018 Prognosis for Safe Diet Advancement Good Barriers to Reach Goals Cognitive deficits;Severity of deficits Barriers/Prognosis Comment -- CHL IP DIET RECOMMENDATION 04/15/2018 SLP Diet Recommendations Dysphagia 1 (Puree) solids;Nectar thick liquid Liquid Administration via Cup;No straw Medication Administration Whole meds with puree Compensations Slow rate;Small sips/bites;Multiple dry swallows after each bite/sip;Chin tuck;Follow solids with liquid Postural Changes Seated upright at 90 degrees   CHL IP OTHER RECOMMENDATIONS 04/15/2018 Recommended Consults -- Oral Care Recommendations Oral care QID Other Recommendations --   CHL IP FOLLOW UP RECOMMENDATIONS 04/15/2018 Follow up Recommendations Inpatient Rehab   CHL IP FREQUENCY AND DURATION 04/15/2018 Speech Therapy Frequency (ACUTE ONLY) min 2x/week Treatment Duration 2 weeks      CHL IP ORAL PHASE 04/15/2018 Oral Phase WFL Oral - Pudding Teaspoon -- Oral - Pudding Cup -- Oral - Honey Teaspoon -- Oral - Honey Cup -- Oral - Nectar Teaspoon -- Oral - Nectar Cup -- Oral - Nectar Straw -- Oral - Thin Teaspoon -- Oral - Thin Cup -- Oral - Thin Straw -- Oral - Puree -- Oral - Mech Soft -- Oral - Regular -- Oral - Multi-Consistency -- Oral - Pill -- Oral Phase - Comment --  CHL IP PHARYNGEAL PHASE 04/15/2018 Pharyngeal Phase Impaired Pharyngeal- Pudding Teaspoon -- Pharyngeal -- Pharyngeal- Pudding Cup -- Pharyngeal -- Pharyngeal- Honey Teaspoon Reduced epiglottic inversion;Reduced tongue base retraction;Pharyngeal residue - pyriform;Pharyngeal residue - valleculae;Pharyngeal residue - posterior pharnyx;Penetration/Aspiration during swallow;Reduced anterior laryngeal mobility Pharyngeal Material enters airway, remains ABOVE vocal cords then ejected out Pharyngeal- Honey Cup Reduced epiglottic inversion;Reduced tongue base retraction;Pharyngeal residue -  pyriform;Pharyngeal residue - valleculae;Pharyngeal residue - posterior pharnyx;Penetration/Aspiration during swallow;Reduced anterior laryngeal mobility Pharyngeal Material enters airway, remains ABOVE vocal cords then ejected out Pharyngeal- Nectar Teaspoon Reduced epiglottic inversion;Reduced tongue base retraction;Pharyngeal residue - pyriform;Pharyngeal residue - valleculae;Pharyngeal residue - posterior pharnyx;Penetration/Aspiration during swallow;Reduced anterior laryngeal mobility Pharyngeal Material enters airway, remains ABOVE vocal cords and not ejected out Pharyngeal- Nectar Cup -- Pharyngeal -- Pharyngeal- Nectar Straw -- Pharyngeal -- Pharyngeal- Thin Teaspoon Reduced epiglottic inversion;Reduced tongue base retraction;Pharyngeal residue - pyriform;Pharyngeal residue - posterior pharnyx;Penetration/Aspiration during swallow;Reduced anterior laryngeal mobility Pharyngeal Material enters airway, remains ABOVE vocal cords and not ejected out Pharyngeal- Thin Cup -- Pharyngeal -- Pharyngeal- Thin Straw -- Pharyngeal -- Pharyngeal- Puree -- Pharyngeal -- Pharyngeal- Mechanical Soft -- Pharyngeal -- Pharyngeal- Regular -- Pharyngeal -- Pharyngeal- Multi-consistency -- Pharyngeal -- Pharyngeal- Pill -- Pharyngeal -- Pharyngeal Comment --  CHL IP CERVICAL ESOPHAGEAL PHASE 04/15/2018 Cervical Esophageal Phase WFL Pudding Teaspoon -- Pudding Cup -- Honey Teaspoon -- Honey Cup -- Nectar Teaspoon -- Nectar Cup -- Nectar Straw -- Thin Teaspoon -- Thin Cup -- Thin Straw -- Puree -- Mechanical Soft -- Regular -- Multi-consistency -- Pill -- Cervical Esophageal Comment -- Shanika I. Vear ClockPhillips, MS, CCC-SLP Acute Rehabilitation Services Office number [phone] Pager [phone] Scheryl MartenShanika I Phillips 04/15/2018, 3:37 PM              Medical  Problem List and Plan: 1.  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  Admit to CIR. 2.  DVT Prophylaxis/Anticoagulation:  subcutaneous Lovenox initiated 04/15/2018. 3. Pain Management:  Neurontin 300 mg twice a day. 4. Mood:  Provide emotional support 5. Neuropsych: This patient is ?fully capable of making decisions on his own behalf. 6. Skin/Wound Care:  Routine skin checks 7. Fluids/Electrolytes/Nutrition:  Routine ins and outs with follow-up chemistries.Hypokalemia. Add potassium 2 days 8. Seizure prophylaxis. Keppra 500 mg twice a day. EEG negative 9. Acute onset atrial fibrillation. Amiodarone discontinued. Cardizem 30 mg every 6 hours. Cardiac rate controlled 10.Dyshagia.Marland KitchenDysphagia 1 nectar thick liquids. Follow-up speech therapy. Advance diet as tolerated.  10. 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 Patient does have a prosthesis. 11. History of aortic valve stenosis status post transcatheter aortic valve replacement 01/22/2017. Patient currently remains on Plavix as prior to admission. Aspirin on hold due to hematoma. 12. Hypothyroidism. Synthroid 13. Hyperlipidemia. Zocor 14. BPH. Flomax. Monitor bladder function.   Mcarthur Rossetti Angiulli, PA-C 04/16/2018

## 2018-04-16 NOTE — IPOC Note (Signed)
Overall Plan of Care [facility]) Patient Details Name: Charles Patrick MRN: [id number] DOB: [birth year]  Admitting Diagnosis: BIlateral SDH  Hospital Problems: Active Problems:   Bilateral subdural hematomas (HCC)   Anemia, chronic disease   PAF (paroxysmal atrial fibrillation) (HCC)     Functional Problem List: Nursing Bladder, Bowel, Endurance, Medication Management, Motor, Nutrition, Safety, Perception  PT Balance, Behavior, Endurance, Nutrition, Pain, Safety, Skin Integrity  OT Balance, Cognition, Endurance  SLP Cognition, Safety, Perception  TR         Basic ADL's: OT Grooming, Bathing, Dressing, Toileting     Advanced  ADL's: OT Simple Meal Preparation     Transfers: PT Bed to Chair, Bed Mobility, Car, Occupational psychologist, Research scientist (life sciences): PT Ambulation, Wheelchair Mobility     Additional Impairments: OT None  SLP Swallowing, Social Cognition   Problem Solving, Memory, Attention, Awareness  TR      Anticipated Outcomes Item Anticipated Outcome  Self Feeding independent  Swallowing  Min A   Basic self-care  supervision  Toileting  supervision   Bathroom Transfers supervision  Bowel/Bladder  patient will remain continent of bowel and bladder. Patient will maintain regular pattern of emptying bowel and bladder  Transfers  Supervision assist with LRAD   Locomotion  Supervision assist WC mobility. CGA ambulation with RW   Communication     Cognition  Min A  Pain  less than 3  Safety/Judgment  Will remain free of falls, skin breakdown and infection while on rehab   Therapy Plan: PT Intensity: Minimum of 1-2 x/day ,45 to 90 minutes PT Frequency: 5 out of 7 days PT Duration Estimated Length of Stay: 10-14 days  OT Intensity: Minimum of 1-2 x/day, 45 to 90 minutes OT Frequency: 5 out of 7 days OT Duration/Estimated Length of Stay: 7-10 days SLP Intensity: Minumum of 1-2 x/day, 30 to 90 minutes SLP Frequency: 3 to 5 out of 7 days SLP  Duration/Estimated Length of Stay: 2 weeks    Team Interventions: Nursing Interventions Bowel Management, Patient/Family Education, Bladder Management, Pain Management, Medication Management, Dysphagia/Aspiration Precaution Training, Psychosocial Support  PT interventions Ambulation/gait training, Cognitive remediation/compensation, Discharge planning, DME/adaptive equipment instruction, Functional mobility training, Pain management, Psychosocial support, Splinting/orthotics, Therapeutic Activities, Visual/perceptual remediation/compensation, UE/LE Strength taining/ROM, Wheelchair propulsion/positioning, Therapeutic Exercise, UE/LE Coordination activities, Stair training, Skin care/wound management, Neuromuscular re-education, Patient/family education, Disease management/prevention, Firefighter, Warden/ranger  OT Interventions Warden/ranger, Discharge planning, DME/adaptive equipment instruction, Functional mobility training, Pain management, Therapeutic Activities, UE/LE Strength taining/ROM, UE/LE Coordination activities, Therapeutic Exercise, Self Care/advanced ADL retraining, Patient/family education, Community reintegration, Cognitive remediation/compensation  SLP Interventions Functional tasks, Patient/family education, Dysphagia/aspiration precaution training  TR Interventions    SW/CM Interventions Discharge Planning, Psychosocial Support, Patient/Family Education   Barriers to Discharge MD  Medical stability  Nursing      PT Inaccessible home environment, Decreased caregiver support, Medication compliance, Behavior    OT      SLP      SW       Team Discharge Planning: Destination: PT-Home ,OT- Home , SLP-Home Projected Follow-up: PT-Home health PT, OT-  Home health OT, SLP-24 hour supervision/assistance, Home Health SLP Projected Equipment Needs: PT-To be determined, OT- To be determined, SLP-To be determined Equipment Details: PT- , OT-   Patient/family involved in discharge planning: PT- Patient,  OT- , SLP-Patient  MD ELOS: 9-13 days. Medical Rehab Prognosis:  Good Assessment: Charles Patrick is a 85 year old right-handed male with history of chronic  anemia,blindness right eye, aortic valve stenosis,  transcatheter aortic valve replacement 01/22/2017 maintained on aspirin and Plavix, hypertension, as well as a complex open right distal tibia and fibula fracture requiring external fixator with irrigation and debridement as well as removal of bone and tendon with ongoing wound care ischemic changes and underwent right below-knee amputation 08/20/2018 per Dr. [name] and was discharged to skilled nursing facility after being denied inpatient rehabilitation services by insurance. Presented 04/11/2018 after being found unresponsive in the bed by family. At the request of 911 he was lowered to the floor by family. Family denies any recent trauma. Cranial CT scan reviewed, showing b/l SDH.  Per report, showed bilateral primarily low density subdural collection along the cerebral convexities measuring up to 9 mm in thickness. No significant cortical mass effect or midline shift. No evidence for infarct. Troponin negative. Neurosurgery Dr. Kerri Perches consulted and advised conservative care. Patient did require intubation for airway protection. Follow-up cranial CT scan remained stable. EEG negative for seizure. He was extubated 04/14/2018. Currently maintained on Keppra for seizure prophylaxis. Patient did develop run of atrial fibrillation initially on amiodarone transition to cardiac exam.Hypokalemia with supplement added. Mild leukocytosis. Foley catheter tube plan to be removed 04/16/2018. Dysphagia 1 nectar thick liquids.Therapy evaluations completed with recommendations of physical medicine rehabilitation consult. Patient with resulting functional deficits with mobility, self-care, cognition, swallowing.  Will set goals for Supervision with PT/OT and  Min A with SLP.   See Team Conference Notes for weekly updates to the plan of care

## 2018-04-16 NOTE — Progress Notes (Signed)
Charles Arn, MD  Physician  Physical Medicine and Rehabilitation  PMR Pre-admission  Signed  Date of Service:  04/16/2018 12:24 PM       Related encounter: ED to Hosp-Admission (Current) from 04/11/2018 in [place] NEURO/TRAUMA/SURGICAL ICU      Signed         Show:Clear all '[x]'$ Manual'[x]'$ Template'[x]'$ Copied  Added by: '[x]'$ Charles Gong, RN'[x]'$ Charles Arn, MD  '[]'$ Hover for details  PMR Admission Coordinator Pre-Admission Assessment  Patient: Charles Patrick is an 85 y.o., male MRN: [id number] DOB: March 30, 1924 Height: '6\' 2"'$  (188 cm)(per chart review prior to BKA) Weight: 77.5 kg  Insurance Information HMO:     PPO:      PCP:      IPA:      80/20:      OTHER: no HMO PRIMARY: Medicare a and b      Policy#: [id number]      Subscriber: pt Benefits:  Phone #: passport one online     Name: 04/16/2018 Eff. Date: 01/27/1992     Deduct: $1408      Out of Pocket Max: none      Life Max: none CIR: 100%      SNF: 20 full days Outpatient: 80%     Co-Pay: 20% Home Health: 100%      Co-Pay: none DME: 80%     Co-Pay: 20% Providers: pt choice  SECONDARY: none       Medicaid Application Date:       Case Manager:  Disability Application Date:       Case Worker:   Emergency Contact Information         Contact Information    Name Relation Home Work Mobile   [place] Son   [phone]   Charles Patrick, Nickless   [phone]   Jahkari, [place]   [phone]      Current Medical History  Patient Admitting Diagnosis: bilateral acute/subacute subdural hematoma  History of Present Illness: Charles Patrick is a 85 year old right-handed male with history of chronic anemia,blindness right eye,aortic valve stenosis, transcatheter aortic valve replacement 01/22/2017 maintained on aspirin and Plavix, hypertension, as well as a complex open right distal tibia and fibula fracture requiring external fixator with irrigation and debridement as well as removal of  bone and tendon with ongoing wound care ischemic changes and underwent right below-knee amputation 08/19/2017 per Dr. Marcelino Patrick and was discharged to skilled nursing facility after being denied inpatient rehabilitation services by insurance.   Presented 02/14/2020after being found unresponsive in the bed by family. At the request of 911 he was lowered to the floor by family. Family denies any recent trauma.Cranial CT scan showed bilateral primarily low density subdural collection along the cerebral convexities measuring up to 9 mm in thickness. No significant cortical mass effect or midline shift. No evidence for infarct. Troponin negative. Neurosurgery Dr. Dierdre Patrick consulted and advised conservative care. Patient did require intubation for airway protection. Follow-up cranial CT scan remained stable. EEG negative for seizure. He was extubated 04/14/2018. Currently maintained on Keppra for seizure prophylaxis. Subcutaneous Lovenox for DVT prophylaxis 04/15/2018. Patient did develop run of atrial fibrillation initially on amiodarone transition to Cardizem. Hypokalemia with supplement added. Mild leukocytosis 15,000. Foley catheter tube was removed 04/16/2018.Dysphagia 1 nectar thick liquids.   Patient[facility] record from [facility] has been reviewed by the rehabilitation admission coordinator and physician.  NIH Stroke scale: 9  Past Medical History      Past Medical History:  Diagnosis  Date  . Anemia   . Aortic valve stenosis   . Arthropathy   . BPH associated with nocturia   . Carotid artery disease ([place])   . Colon polyp   . Dilated aortic root ([place])   . Elevated PSA   . Erectile dysfunction   . Gastric paresis   . Hypercholesterolemia   . Hyperglycemia   . Hypertension    Patient denies  . Hypothyroid   . Iron deficiency   . Pulmonary hypertension ([place])   . 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 Seizure disorder ([place])   . Thrombocytopenia ([place])   . Thyroid nodule   . Tricuspid regurgitation   . Vitamin D deficiency     Family History   family history includes Healthy in his father and mother.  Prior Rehab/Hospitalizations Has the patient had major surgery during 100 days prior to admission? No             [facility]2017 after [facility] Medicare denied CIR admission)  Current Medications  Current Facility-Administered Medications:  .  acetaminophen (TYLENOL) tablet 650 mg, 650 mg, Oral, Q6H PRN, Agarwala, Ravi, MD .  chlorhexidine gluconate (MEDLINE KIT) (PERIDEX) 0.12 % solution 15 mL, 15 mL, Mouth Rinse, BID, Margaretha Seeds, MD, 15 mL at 04/16/18 0800 .  clopidogrel (PLAVIX) tablet 75 mg, 75 mg, Oral, Q breakfast, Agarwala, Ravi, MD, 75 mg at 04/16/18 0830 .  diltiazem (CARDIZEM) tablet 30 mg, 30 mg, Oral, Q6H, Agarwala, Ravi, MD, 30 mg at 04/16/18 1155 .  docusate sodium (COLACE) capsule 100 mg, 100 mg, Oral, BID, Agarwala, Ravi, MD, 100 mg at 04/16/18 0908 .  enoxaparin (LOVENOX) injection 40 mg, 40 mg, Subcutaneous, Q24H, Agarwala, Ravi, MD, 40 mg at 04/15/18 1733 .  gabapentin (NEURONTIN) capsule 300 mg, 300 mg, Oral, BID, Agarwala, Ravi, MD, 300 mg at 04/16/18 1049 .  levETIRAcetam (KEPPRA) tablet 500 mg, 500 mg, Oral, BID, Agarwala, Ravi, MD, 500 mg at 04/16/18 0908 .  levothyroxine (SYNTHROID, LEVOTHROID) tablet 50 mcg, 50 mcg, Oral, Q0600, Kipp Brood, MD, 50 mcg at 04/16/18 0544 .  multivitamin with minerals tablet 1 tablet, 1 tablet, Oral, Daily, Kipp Brood, MD, 1 tablet at 04/16/18 0909 .  simvastatin (ZOCOR) tablet 5 mg, 5 mg, Oral, q1800, Agarwala, Ravi, MD, 5 mg at 04/15/18 1733 .  tamsulosin (FLOMAX) capsule 0.4 mg, 0.4 mg, Oral, QPC supper, Agarwala, Ravi, MD, 0.4 mg at 04/15/18 1733 .  vitamin C (ASCORBIC ACID) tablet 500 mg, 500 mg, Oral, Daily,  Agarwala, Ravi, MD, 500 mg at 04/16/18 0909  Patients Current Diet:      Diet Order                  DIET - DYS 1 Room service appropriate? Yes; Fluid consistency: Nectar Thick  Diet effective now               Precautions / Restrictions Precautions Precautions: Fall Precaution Comments: blind right eye, prosthesis (R BKA) Restrictions Weight Bearing Restrictions: No   Has the patient had 2 or more falls or a fall with injury in the past year?No  Prior Activity Level Community (5-7x/wk): Mod I with RW and RLE prosthesis; active; also uses his wheelchair as needed  Prior Functional Level Do you want Prior Function Level of Independence: Independent with assistive device(s) Comments: Ludwig Clarks confirms pt is Mod  I with RW and prosthesiss from other? Self Care: Did the patient need help bathing, dressing, using the toilet or eating?  Independent  Indoor Mobility: Did the patient need assistance with walking from room to room (with or without device)? Independent  Stairs: Did the patient need assistance with internal or external stairs (with or without device)? Independent  Functional Cognition: Did the patient need help planning regular tasks such as shopping or remembering to take medications? Independent  Home Assistive Devices / Equipment Home Assistive Devices/Equipment: Prosthesis, Environmental consultant (specify type), Wheelchair Home Equipment: Walker - 2 wheels  Prior Device Use: Indicate devices/aids used by the patient prior to current illness, exacerbation or injury? RLE prosthesis and RW as well as wheelchair as needed  Prior Functional Level Comments: Ludwig Clarks confirms pt is Mod I with RW and prosthesiss   Prior Functional Level Current Functional Level  Bed Mobility  Independent Needs Assistance; supine to sit min guard  Transfers  Mod I with Prosthesis and RW   Mod assist to stand with posterior lean and assist for balance and rising from surface    Mobility - Walk/Wheelchair  Mod I prosthesis and RW   Mobility - Ambulation/Gait  Mod I  Mod assist, +2 safety/equipment, +2 physical assistance; 100 feet with RW on 2/18  Upper Body Dressing  Independent Set up, Sitting  Lower Body Dressing  Independent Moderate assistance, Sit to/from stand, +2 for physical assistance, +2 for safety/equipment  Grooming  Independent Set up, Sitting  Eating/Drinking  Independent   Set up  Toilet Transfer  Independent Moderate assistance, +2 for safety/equipment, +2 for physical assistance, Ambulation, RW  Bladder Continence  Continent   foley inserted 2/14 and to be removed 2/19  Bowel Management   Continent   no LBM documented; continent  Stair Climbing  Only goes up one to 2 steps   not attempted  Communication  Independent No difficulties  Memory  Intact Decreased short-term memory  Cooking/Meal Prep   Independent      Housework   Independent   Money Management   Sons assist   Driving   No     Special needs/care consideration BiPAP/CPAP n/a CPM n/a Continuous Drip IV n/a Dialysis n/a Life Vest n/a Oxygen O2 at 2 liters nasal cannula; none pta Special Bed n/a Trach Size n/a Wound Vac n/a Skin old right BKA Bowel mgmt: no LBM documented Bladder mgmt: #16 french inserted 2/14; to be discontinued 2/19 Diabetic mgmt n/a  Previous Home Environment Living Arrangements: Spouse/significant other(Lives with 65 year old wife, Burman Nieves and adult son, Kiyaan)  Lives With: Spouse, Son(Maggie and Shanon Brow) Available Help at Discharge: Family, Available 24 hours/day Type of Home: House Home Layout: One level Home Access: Stairs to enter [employer] of Steps: 1 Bathroom Shower/Tub: Chiropodist: Handicapped height Bathroom Accessibility: Yes How Accessible: Accessible via walker, Accessible via wheelchair [place]: No Additional Comments: glasses   Discharge Living Setting Plans for Discharge  Living Setting: Patient's home, Lives with (comment)(wife, Burman Nieves and son, Antavius) Type of Home at Discharge: House Discharge Home Layout: One level Discharge Home Access: Stairs to enter Entrance Stairs-Rails: None Entrance Stairs-Number of Steps: 1 Discharge Bathroom Shower/Tub: Tub/shower unit Discharge Bathroom Toilet: Handicapped height Discharge Bathroom Accessibility: Yes How Accessible: Accessible via wheelchair, Accessible via walker Does the patient have any problems obtaining your medications?: (meds from the [place])  Social/Family/Support Systems Patient Roles: Spouse, Parent Contact Information: Ludwig Clarks, son is main contact Anticipated Caregiver: wife and sons Anticipated  Caregiver's Contact Information: Ludwig Clarks [phone] Ability/Limitations of Caregiver: Tore drives long distance truck; Ludwig Clarks is local; wife is 70 years old Caregiver Availability: 24/7 Discharge Plan Discussed with Primary Caregiver: Yes Is Caregiver In Agreement with Plan?: Yes Does Caregiver/Family have Issues with Lodging/Transportation while Pt is in Rehab?: No  Goals/Additional Needs Patient/Family Goal for Rehab: Mod I to supervision with PT, OT, and SLP Expected length of stay: ELOS 10 to 14 days Special Service Needs: Patietn blind right eye Pt/Family Agrees to Admission and willing to participate: Yes Program Orientation Provided & Reviewed with Pt/Caregiver Including Roles  & Responsibilities: Yes  Patient Condition: I have reviewed medical records from [facility], spoken with patient and son at bedside. I met with patient at the bedside for inpatient rehabilitation assessment.  Patient will benefit from ongoing PT, OT, and SLP, can actively participate in 3 hours of therapy a day 5 days of the week, and can make measurable gains during the admission.  Patient will also benefit from the coordinated team approach during an Inpatient Acute Rehabilitation admission.  The patient will receive  intensive therapy as well as Rehabilitation physician, nursing, social worker, and care management interventions.  Due to bowel management, bladder management, safety, skin/wound care, disease management, medical administration, pain management, patient education the patient requires 24 hour a day rehabilitation nursing.  The patient is currently min to mod assist with mobility and basic ADLs.  Discharge setting and therapy post discharge at  home with home health is anticipated.  Patient has agreed to participate in the Acute Inpatient Rehabilitation Program and will admit today.  Preadmission Screen Completed By:  Cleatrice Burke RN MSN, 04/16/2018 12:24 PM ______________________________________________________________________   Discussed status with Dr. Posey Pronto  on  04/16/2018  at  1315 and received telephone approval for admission today.  Admission Coordinator:  Cleatrice Burke RN MSN, time  8341 Date  04/16/2018   Assessment/Plan: Diagnosis: bilateral acute/subacute subdural hematoma 1. Does the need for close, 24 hr/day  Medical supervision in concert with the patient's rehab needs make it unreasonable for this patient to be served in a less intensive setting? Yes  2. Co-Morbidities requiring supervision/potential complications: chronic anemia, blindness right eye,aortic valve stenosis, transcatheter aortic valve replacement, hypertension, right below-knee amputation  3. Due to safety, disease management and patient education, does the patient require 24 hr/day rehab nursing? Yes 4. Does the patient require coordinated care of a physician, rehab nurse, PT (1-2 hrs/day, 5 days/week) and OT (1-2 hrs/day, 5 days/week) to address physical and functional deficits in the context of the above medical diagnosis(es)? Yes Addressing deficits in the following areas: balance, endurance, locomotion, strength, transferring, bathing, dressing, toileting and psychosocial support 5. Can the  patient actively participate in an intensive therapy program of at least 3 hrs of therapy 5 days a week? Yes 6. The potential for patient to make measurable gains while on inpatient rehab is excellent 7. Anticipated functional outcomes upon discharge from inpatients are: supervision and min assist PT, supervision and min assist OT, n/a SLP 8. Estimated rehab length of stay to reach the above functional goals is: 10-15 days. 9. Anticipated D/C setting: Home 10. Anticipated post D/C treatments: HH therapy and Home excercise program 11. Overall Rehab/Functional Prognosis: good   Cleatrice Burke RN MSN Admissions Coordinator 04/16/2018  Delice Lesch, MD, ABPMR        Revision History

## 2018-04-16 NOTE — H&P (Signed)
Physical Medicine and Rehabilitation Admission H&P    Chief Complaint  Patient presents with  . 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 Carola FrostHandy and was discharged to skilled nursing facility after being denied inpatient rehabilitation services by insurance. History taken from chart review, patient, and family.  Presently patient lives with spouse. One level home with one step to entry. He was using a walker prior to admission as well as a prosthesis. Presented 04/11/2018 after being found unresponsive in the bed by family. At the request of 911 he was lowered to the floor by family. Family denies any recent trauma. Cranial CT scan reviewed, showing b/l SDH.  Per report, showed bilateral primarily low density subdural collection along the cerebral convexities measuring up to 9 mm in thickness. No significant cortical mass effect or midline shift. No evidence for infarct. Troponin negative. Neurosurgery Dr. Kerri PerchesJody Stern consulted and advised conservative care. Patient did require intubation for airway protection. Follow-up cranial CT scan remained stable. EEG negative for seizure. He was extubated 04/14/2018. Currently maintained on Keppra for seizure prophylaxis. Subcutaneous Lovenox for DVT prophylaxis 04/15/2018. Patient did develop run of atrial fibrillation initially on amiodarone transition to cardiac exam.Hypokalemia with supplement added. Mild leukocytosis 15,000. Foley catheter tube plan to be removed 04/16/2018. Dysphagia 1 nectar thick  liquids.Therapy evaluations completed with recommendations of physical medicine rehabilitation consult. Patient was admitted for a comprehensive rehabilitation program. Please see preadmission assessment as well.    Review of Systems  Constitutional: Negative for chills and fever.  HENT: Negative for hearing loss.   Eyes:       Blindness right eye  Respiratory: Negative for cough and shortness of breath.   Cardiovascular: Negative for chest pain.  Gastrointestinal: Positive for constipation. Negative for nausea and vomiting.  Genitourinary: Positive for urgency.  Musculoskeletal: Positive for joint pain and myalgias.  Skin: Negative for rash.  All other systems reviewed and are negative.  Past Medical History:  Diagnosis Date  . Anemia   . Aortic valve stenosis   . Arthropathy   . BPH associated with nocturia   . Carotid artery disease (HCC)   . Colon polyp   . Dilated aortic root (HCC)   . Elevated PSA   . Erectile dysfunction   . Gastric paresis   . Hypercholesterolemia   . Hyperglycemia   . Hypertension    Patient denies  . Hypothyroid   . Iron deficiency   . Pulmonary hypertension (HCC)   . 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 Seizure disorder (HCC)   . Thrombocytopenia (HCC)   . Thyroid nodule   . Tricuspid regurgitation   . Vitamin D deficiency    Past Surgical History:  Procedure Laterality Date  . AMPUTATION Right 08/19/2017   Procedure: AMPUTATION BELOW KNEE;  Surgeon: Myrene GalasHandy, [name], MD;  [facility];  Service: Orthopedics;  Laterality: Right;  . EXTERNAL FIXATION LEG Right 08/14/2017   Procedure: EXTERNAL FIXATION LEG;  Surgeon: Toni ArthursHewitt, John, MD;  [facility] OR;  Service: Orthopedics;  Laterality: Right;  . I&D EXTREMITY Right 08/16/2017   Procedure: IRRIGATION AND DEBRIDEMENT EXTREMITY;  Surgeon: Myrene Galas, MD;   Location: [facility] OR;  Service: Orthopedics;  Laterality: Right;  . INCISION AND DRAINAGE Right 08/14/2017   Procedure: IRRIGATION AND DEBRIDMENT RIGHT ANKLE, APPLICATION WOUND VAC;  Surgeon: Toni Arthurs, MD;  Location: [facility] OR;  Service: Orthopedics;  Laterality: Right;  . MULTIPLE EXTRACTIONS WITH ALVEOLOPLASTY N/A 10/17/2016   Procedure: Extraction of tooth #'s 4, 21,22,27,28,29 with alveoloplasty;  Surgeon: Charlynne Pander, DDS;  Location: [facility] OR;  Service: Oral Surgery;  Laterality: N/A;  . NO PAST SURGERIES    . RIGHT/LEFT HEART CATH AND CORONARY ANGIOGRAPHY N/A 01/11/2017   Procedure: RIGHT/LEFT HEART CATH AND CORONARY ANGIOGRAPHY;  Surgeon: Tonny Bollman, MD;  Location: [facility] INVASIVE CV LAB;  Service: Cardiovascular;  Laterality: N/A;  . TEE WITHOUT CARDIOVERSION N/A 01/22/2017   Procedure: TRANSESOPHAGEAL ECHOCARDIOGRAM (TEE);  Surgeon: Tonny Bollman, MD;  Location: [facility] OR;  Service: Open Heart Surgery;  Laterality: N/A;  . TRANSCATHETER AORTIC VALVE REPLACEMENT, TRANSFEMORAL N/A 01/22/2017   Procedure: TRANSCATHETER AORTIC VALVE REPLACEMENT, TRANSFEMORAL using Edwards 29 Sapien 3 Aortic Valve;  Surgeon: Tonny Bollman, MD;  Location: [facility] OR;  Service: Open Heart Surgery;  Laterality: N/A;   Family History  Problem Relation Age of Onset  . Healthy Mother   . Healthy Father    Social History:  reports that he has never smoked. He has never used smokeless tobacco. He reports that he does not drink alcohol or use drugs. Allergies: No Known Allergies Medications Prior to Admission  Medication Sig Dispense Refill  . acetaminophen (TYLENOL) 500 MG tablet Take 2 tablets (1,000 mg total) by mouth every 8 (eight) hours. 30 tablet 0  . aspirin EC 81 MG tablet Take 81 mg by mouth daily.     Marland Kitchen levothyroxine (SYNTHROID, LEVOTHROID) 50 MCG tablet Take 50 mcg by mouth daily before breakfast.     . Multiple Vitamin (MULTIVITAMIN) capsule Take 1 capsule by mouth daily.    . simvastatin (ZOCOR) 5 MG  tablet Take 5 mg by mouth daily at 6 PM.     . tamsulosin (FLOMAX) 0.4 MG CAPS capsule Take 0.4 mg by mouth daily after supper.     Marland Kitchen amoxicillin (AMOXIL) 500 MG tablet Take 4 tablets by mouth one hour prior to dental appointments and stomach or bladder procedures 4 tablet 3  . clopidogrel (PLAVIX) 75 MG tablet Take 1 tablet (75 mg total) by mouth daily with breakfast. (Patient not taking: Reported on 04/11/2018) 30 tablet 5  . docusate sodium (COLACE) 100 MG capsule Take 1 capsule (100 mg total) by mouth 2 (two) times daily. (Patient not taking: Reported on 04/11/2018) 10 capsule 0  . enoxaparin (LOVENOX) 40 MG/0.4ML injection Inject 0.4 mLs (40 mg total) into the skin daily for 7 days. 7 Syringe 0  . gabapentin (NEURONTIN) 300 MG capsule Take 1 capsule (300 mg total) by mouth 2 (two) times daily. (Patient not taking: Reported on 04/11/2018) 60 capsule 0  . oxyCODONE (OXY IR/ROXICODONE) 5 MG immediate release tablet Take 1-2 tablets (5-10 mg total) by mouth every 6 (six) hours as needed for moderate pain or severe pain (pain score 4-6). (Patient not taking: Reported on 04/11/2018) 30 tablet 0  . vitamin C (VITAMIN C) 500 MG tablet Take 1 tablet (500 mg total) by mouth daily. (Patient not taking: Reported on 04/11/2018) 30 tablet 1    Drug Regimen Review Drug  regimen was reviewed and remains appropriate with no significant issues identified  Home: Home Living Family/patient expects to be discharged to:: Private residence Living Arrangements: Spouse/significant other Available Help at Discharge: Family, Available 24 hours/day Type of Home: House Home Access: Stairs to enter Secretary/administrator of Steps: 1 Home Layout: One level Bathroom Shower/Tub: Engineer, manufacturing systems: Handicapped height Home Equipment: Environmental consultant - 2 wheels Additional Comments: glasses    Functional History: Prior Function Level of Independence: Independent with assistive device(s) Comments: pt reports  independent ADLs, using RW for mobility but no family present to confirm (inconsistent history, initally reporting not using any AD)  Functional Status:  Mobility: Bed Mobility Overal bed mobility: Needs Assistance Bed Mobility: Supine to Sit Supine to sit: Min guard General bed mobility comments: guarding for lines with HOB 20 degrees and pt able to achieve sitting without physical assist Transfers Overall transfer level: Needs assistance Equipment used: None Transfers: Sit to/from Stand Sit to Stand: Mod assist, +2 physical assistance General transfer comment: mod +2 assist to stand from bed with posterior lean and assist for balance and rising from surface, cueing for hand placement and safety Ambulation/Gait Ambulation/Gait assistance: Mod assist, +2 safety/equipment, +2 physical assistance Gait Distance (Feet): 100 Feet Assistive device: Rolling walker (2 wheeled) Gait Pattern/deviations: Step-to pattern, Narrow base of support, Trunk flexed General Gait Details: pt with flexed trunk, very narrow BOS, right lean with tendency to maintain self very posterior to Rw. Cues for posture, position in RW, safety and +2 for lines, safety and balance with directional cues Gait velocity interpretation: 1.31 - 2.62 ft/sec, indicative of limited community ambulator    ADL: ADL Overall ADL's : Needs assistance/impaired Grooming: Set up, Sitting Upper Body Bathing: Set up, Sitting Lower Body Bathing: Minimal assistance, +2 for safety/equipment, Sit to/from stand Upper Body Dressing : Set up, Sitting Lower Body Dressing: Moderate assistance, Sit to/from stand, +2 for physical assistance, +2 for safety/equipment Lower Body Dressing Details (indicate cue type and reason): able to don R prosthesis with increased time, min guard for forward lean for safety, mod assist +2 sit>stand  Toilet Transfer: Moderate assistance, +2 for safety/equipment, +2 for physical assistance, Ambulation, RW Toilet  Transfer Details (indicate cue type and reason): simulated to recliner  Functional mobility during ADLs: Minimal assistance, Moderate assistance, +2 for physical assistance, +2 for safety/equipment, Rolling walker, Cueing for safety General ADL Comments: patient limited by generalized weakness, cogniton and safety   Cognition: Cognition Overall Cognitive Status: Impaired/Different from baseline Orientation Level: Oriented to person, Oriented to place, Oriented to situation, Disoriented to time Cognition Arousal/Alertness: Awake/alert Behavior During Therapy: Flat affect Overall Cognitive Status: Impaired/Different from baseline Area of Impairment: Orientation, Attention, Memory, Problem solving, Safety/judgement, Awareness Orientation Level: Disoriented to, Time, Place, Situation Current Attention Level: Sustained Memory: Decreased short-term memory Safety/Judgement: Decreased awareness of safety, Decreased awareness of deficits Awareness: Emergent Problem Solving: Slow processing, Requires verbal cues General Comments: patient requires increased time and effort for all activities, requires re-orientation to place, year and situation (but able to recall at completion of session), decreased safety awareness noted with mobility   Physical Exam: Blood pressure 102/73, pulse (!) 57, temperature 97.8 F (36.6 C), temperature source Oral, resp. rate 15, height 6\' 2"  (1.88 m), weight 77.5 kg, SpO2 100 %. Physical Exam  Vitals reviewed. Constitutional: He appears well-developed and well-nourished.  HENT:  Head: Normocephalic and atraumatic.  Patient is blind in the right eye/opaque  Eyes: Right eye exhibits no discharge. Left eye exhibits no  discharge.  Opaque right eye  Neck: Neck supple.  Cardiovascular: Normal rate and regular rhythm.  Respiratory: Effort normal and breath sounds normal.  +Ronks  GI: Soft. Bowel sounds are normal.  Musculoskeletal:     Comments: No edema or tenderness  in extremities Right BKA  Neurological: He is alert.  Patient is alert sitting up in bed.  Makes eye contact with examiner.  Follows commands.  Fair awareness of deficits. Motor: Grossly 4+/5 throughout Alert and oriented, except for situation  Skin: Skin is warm and dry.  Right BKA is well healed  Psychiatric: He has a normal mood and affect. His behavior is normal.    Results for orders placed or performed during the hospital encounter of 04/11/18 (from the past 48 hour(s))  Glucose, capillary     Status: Abnormal   Collection Time: 04/14/18 11:36 AM  Result Value Ref Range   Glucose-Capillary 125 (H) 70 - 99 mg/dL   Comment 1 Notify RN    Comment 2 Document in Chart   Glucose, capillary     Status: Abnormal   Collection Time: 04/14/18  3:49 PM  Result Value Ref Range   Glucose-Capillary 106 (H) 70 - 99 mg/dL   Comment 1 Notify RN    Comment 2 Document in Chart   Magnesium     Status: None   Collection Time: 04/14/18  7:05 PM  Result Value Ref Range   Magnesium 2.0 1.7 - 2.4 mg/dL    Comment: Performed at [facility], [address] [place], [place] [address]  TSH     Status: Abnormal   Collection Time: 04/14/18  7:05 PM  Result Value Ref Range   TSH 5.772 (H) 0.350 - 4.500 uIU/mL    Comment: Performed by a 3rd Generation assay with a functional sensitivity of <=0.01 uIU/mL. Performed at [facility], [address] [place], [place] [address]   Digoxin level     Status: None   Collection Time: 04/14/18  7:05 PM  Result Value Ref Range   Digoxin Level 1.1 0.8 - 2.0 ng/mL    Comment: Performed at [facility], [address], [place], [place] [address]  Hepatic function panel     Status: Abnormal   Collection Time: 04/14/18  7:05 PM  Result Value Ref Range   Total Protein 7.0 6.5 - 8.1 g/dL   Albumin 2.9 (L) 3.5 - 5.0 g/dL   AST 23 15 - 41 U/L   ALT 13 0 - 44 U/L   Alkaline Phosphatase 67 38 - 126 U/L   Total Bilirubin 0.9 0.3 - 1.2 mg/dL    Bilirubin, Direct 0.3 (H) 0.0 - 0.2 mg/dL   Indirect Bilirubin 0.6 0.3 - 0.9 mg/dL    Comment: Performed at [facility], [address] [place], [place] [address]  Glucose, capillary     Status: Abnormal   Collection Time: 04/14/18  8:03 PM  Result Value Ref Range   Glucose-Capillary 127 (H) 70 - 99 mg/dL  Glucose, capillary     Status: Abnormal   Collection Time: 04/14/18 11:51 PM  Result Value Ref Range   Glucose-Capillary 111 (H) 70 - 99 mg/dL  Glucose, capillary     Status: None   Collection Time: 04/15/18  2:55 AM  Result Value Ref Range   Glucose-Capillary 90 70 - 99 mg/dL  Basic metabolic panel     Status: Abnormal   Collection Time: 04/15/18  4:39 AM  Result Value Ref Range   Sodium 136 135 - 145 mmol/L   Potassium 3.5 3.5 - 5.1 mmol/L   Chloride 104 98 - 111 mmol/L   CO2 22 22 - 32 mmol/L   Glucose, Bld 113 (H) 70 - 99 mg/dL   BUN 16 8 - 23 mg/dL   Creatinine, Ser 8.36 0.61 - 1.24 mg/dL   Calcium 8.5 (L) 8.9 - 10.3 mg/dL   GFR calc non Af Amer 58 (L) >60 mL/min   GFR calc Af Amer >60 >60 mL/min   Anion gap 10 5 - 15    Comment: Performed at [facility], [address], [place], [place] [address]  Magnesium     Status: None   Collection Time: 04/15/18  4:39 AM  Result Value Ref Range   Magnesium 2.1 1.7 - 2.4 mg/dL    Comment: Performed at [facility], [address] [place], [place] [address]  Glucose, capillary     Status: Abnormal   Collection Time: 04/15/18  8:06 AM  Result Value Ref Range   Glucose-Capillary 102 (H) 70 - 99 mg/dL   Comment 1 Notify RN    Comment 2 Document in Chart   Glucose, capillary     Status: None   Collection Time: 04/15/18 11:50 AM  Result Value Ref Range   Glucose-Capillary 98 70 - 99 mg/dL   Comment 1 Notify RN    Comment 2 Document in Chart   Glucose, capillary     Status: None   Collection Time: 04/15/18  3:43 PM  Result Value Ref Range   Glucose-Capillary 95 70 - 99 mg/dL   Comment 1 Notify RN     Comment 2 Document in Chart   Glucose, capillary     Status: Abnormal   Collection Time: 04/15/18  7:58 PM  Result Value Ref Range   Glucose-Capillary 117 (H) 70 - 99 mg/dL  Glucose, capillary     Status: Abnormal   Collection Time: 04/15/18 11:52 PM  Result Value Ref Range   Glucose-Capillary 101 (H) 70 - 99 mg/dL  Glucose, capillary     Status: Abnormal   Collection Time: 04/16/18  3:39 AM  Result Value Ref Range   Glucose-Capillary 109 (H) 70 - 99 mg/dL  Basic metabolic panel     Status: Abnormal   Collection Time: 04/16/18  5:01 AM  Result Value Ref Range   Sodium 139 135 - 145 mmol/L   Potassium 3.0 (L) 3.5 - 5.1 mmol/L   Chloride 103 98 - 111 mmol/L   CO2 23 22 - 32 mmol/L   Glucose, Bld 107 (H) 70 - 99 mg/dL   BUN 16 8 - 23 mg/dL   Creatinine, Ser 0.35 0.61 - 1.24 mg/dL   Calcium 8.6 (L) 8.9 - 10.3 mg/dL   GFR calc non Af Amer >60 >60 mL/min   GFR calc Af Amer >60 >60 mL/min   Anion gap 13 5 - 15    Comment: Performed at [facility], [address], [place], [place] [address]  Glucose, capillary     Status: None   Collection Time: 04/16/18  7:58 AM  Result Value Ref Range   Glucose-Capillary 93 70 - 99 mg/dL   Dg Swallowing Func-speech Pathology  Result Date: 04/15/2018 Objective Swallowing Evaluation: Type of Study: MBS-Modified Barium Swallow Study  Patient Details Name: Charles Patrick MRN: [id number] Date of Birth: 04-28-1924 Today's Date: 04/15/2018 Time: SLP Start Time (ACUTE ONLY): 1402 -  SLP Stop Time (ACUTE ONLY): 1417 SLP Time Calculation (min) (ACUTE ONLY): 15 min Past Medical History: Past Medical History: Diagnosis Date . Anemia  . Aortic valve stenosis  . Arthropathy  . BPH associated with nocturia  . Carotid artery disease (HCC)  . Colon polyp  . Dilated aortic root (HCC)  . Elevated PSA  . Erectile dysfunction  . Gastric paresis  . Hypercholesterolemia  . Hyperglycemia  . Hypertension   Patient denies . Hypothyroid  . Iron deficiency  . Pulmonary  hypertension (HCC)  . 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 Seizure disorder (HCC)  . Thrombocytopenia (HCC)  . Thyroid nodule  . Tricuspid regurgitation  . Vitamin D deficiency  Past Surgical History: Past Surgical History: Procedure Laterality Date . AMPUTATION Right 08/19/2017  Procedure: AMPUTATION BELOW KNEE;  Surgeon: Myrene GalasHandy, [name], MD;  [facility];  Service: Orthopedics;  Laterality: Right; . EXTERNAL FIXATION LEG Right 08/14/2017  Procedure: EXTERNAL FIXATION LEG;  Surgeon: Toni ArthursHewitt, John, MD;  [facility]: [facility] OR;  Service: Orthopedics;  Laterality: Right; . I&D EXTREMITY Right 08/16/2017  Procedure: IRRIGATION AND DEBRIDEMENT EXTREMITY;  Surgeon: Myrene GalasHandy, [name], MD;  [facility];  Service: Orthopedics;  Laterality: Right; . INCISION AND DRAINAGE Right 08/14/2017  Procedure: IRRIGATION AND DEBRIDMENT RIGHT ANKLE, APPLICATION WOUND VAC;  Surgeon: Toni ArthursHewitt, John, MD;  Location: [facility] OR;  Service: Orthopedics;  Laterality: Right; . MULTIPLE EXTRACTIONS WITH ALVEOLOPLASTY N/A 10/17/2016  Procedure: Extraction of tooth #'s 4, 21,22,27,28,29 with alveoloplasty;  Surgeon: [name], [name], DDS;  [facility];  Service: Oral Surgery;  Laterality: N/A; . NO PAST SURGERIES   . RIGHT/LEFT HEART CATH AND CORONARY ANGIOGRAPHY N/A 01/11/2017  Procedure: RIGHT/LEFT HEART CATH AND CORONARY ANGIOGRAPHY;  Surgeon: [name], [name], MD;  [facility];  Service: Cardiovascular;  Laterality: N/A; . TEE WITHOUT CARDIOVERSION N/A 01/22/2017  Procedure: TRANSESOPHAGEAL ECHOCARDIOGRAM (TEE);  Surgeon: [name], [name], MD;  [facility];  Service: Open Heart Surgery;  Laterality: N/A; . TRANSCATHETER AORTIC VALVE REPLACEMENT, TRANSFEMORAL N/A 01/22/2017  Procedure: TRANSCATHETER AORTIC VALVE REPLACEMENT, TRANSFEMORAL using Edwards 29 Sapien 3 Aortic Valve;  Surgeon: [name], [name], MD;   [facility];  Service: Open Heart Surgery;  Laterality: N/A; HPI: Pt is a 85 y.o male found with altered mental status at home. Intubated in the ER on 2/14. The CT of the head of 04/11/18 revealed bilateral primarily low-density subdural collection along the cerebral convexities measuring up to 9 mm in thickness. EEG was negative. Chest x-ray of 04/13/18 showed streaky opacities within the right mid lung region and at the right lung base suggesting pneumonia versus asymmetric edema. Pt was extubated on 04/14/18.  No data recorded Assessment / Plan / Recommendation CHL IP CLINICAL IMPRESSIONS 04/15/2018 Clinical Impression Pt was seen in radiology suite for modified barium swallow study. He was seated upright in swallow chair for the study which was conducted in lateral view. Trials of puree, thin liquids, nectar thick liquids, and honey thick liquids were administered. He presents with moderate-severe pharyngeal phase dysphagia. He demonstrated reduced laryngeal excursion which resulted in incomplete epiglottic retroversion, pyriform sinus residue across all consistencies, and consistent penetration of liquids. Reduced lingual retraction resulted in moderate vallecular residue and mild-moderate pharyngeal wall residue was observed due to reduced pharyngeal constriction. No instances of aspiraton were observed but pt is at moderate risk for aspiration if swallowing precautions are not strictly observed. A dysphagia  1 diet with nectar thick liquids is recommended at this time with use of compensatory strategies to reduce aspiration risk. SLP will follow to ensure tolerance of the current diet, his ability to safely tolerate more advanced consistencies and the need for further intervention.  SLP Visit Diagnosis Dysphagia, pharyngeal phase (R13.13) Attention and concentration deficit following -- Frontal lobe and executive function deficit following -- Impact on safety and function Moderate aspiration risk   CHL IP  TREATMENT RECOMMENDATION 04/15/2018 Treatment Recommendations Therapy as outlined in treatment plan below   Prognosis 04/15/2018 Prognosis for Safe Diet Advancement Good Barriers to Reach Goals Cognitive deficits;Severity of deficits Barriers/Prognosis Comment -- CHL IP DIET RECOMMENDATION 04/15/2018 SLP Diet Recommendations Dysphagia 1 (Puree) solids;Nectar thick liquid Liquid Administration via Cup;No straw Medication Administration Whole meds with puree Compensations Slow rate;Small sips/bites;Multiple dry swallows after each bite/sip;Chin tuck;Follow solids with liquid Postural Changes Seated upright at 90 degrees   CHL IP OTHER RECOMMENDATIONS 04/15/2018 Recommended Consults -- Oral Care Recommendations Oral care QID Other Recommendations --   CHL IP FOLLOW UP RECOMMENDATIONS 04/15/2018 Follow up Recommendations Inpatient Rehab   CHL IP FREQUENCY AND DURATION 04/15/2018 Speech Therapy Frequency (ACUTE ONLY) min 2x/week Treatment Duration 2 weeks      CHL IP ORAL PHASE 04/15/2018 Oral Phase WFL Oral - Pudding Teaspoon -- Oral - Pudding Cup -- Oral - Honey Teaspoon -- Oral - Honey Cup -- Oral - Nectar Teaspoon -- Oral - Nectar Cup -- Oral - Nectar Straw -- Oral - Thin Teaspoon -- Oral - Thin Cup -- Oral - Thin Straw -- Oral - Puree -- Oral - Mech Soft -- Oral - Regular -- Oral - Multi-Consistency -- Oral - Pill -- Oral Phase - Comment --  CHL IP PHARYNGEAL PHASE 04/15/2018 Pharyngeal Phase Impaired Pharyngeal- Pudding Teaspoon -- Pharyngeal -- Pharyngeal- Pudding Cup -- Pharyngeal -- Pharyngeal- Honey Teaspoon Reduced epiglottic inversion;Reduced tongue base retraction;Pharyngeal residue - pyriform;Pharyngeal residue - valleculae;Pharyngeal residue - posterior pharnyx;Penetration/Aspiration during swallow;Reduced anterior laryngeal mobility Pharyngeal Material enters airway, remains ABOVE vocal cords then ejected out Pharyngeal- Honey Cup Reduced epiglottic inversion;Reduced tongue base retraction;Pharyngeal residue -  pyriform;Pharyngeal residue - valleculae;Pharyngeal residue - posterior pharnyx;Penetration/Aspiration during swallow;Reduced anterior laryngeal mobility Pharyngeal Material enters airway, remains ABOVE vocal cords then ejected out Pharyngeal- Nectar Teaspoon Reduced epiglottic inversion;Reduced tongue base retraction;Pharyngeal residue - pyriform;Pharyngeal residue - valleculae;Pharyngeal residue - posterior pharnyx;Penetration/Aspiration during swallow;Reduced anterior laryngeal mobility Pharyngeal Material enters airway, remains ABOVE vocal cords and not ejected out Pharyngeal- Nectar Cup -- Pharyngeal -- Pharyngeal- Nectar Straw -- Pharyngeal -- Pharyngeal- Thin Teaspoon Reduced epiglottic inversion;Reduced tongue base retraction;Pharyngeal residue - pyriform;Pharyngeal residue - posterior pharnyx;Penetration/Aspiration during swallow;Reduced anterior laryngeal mobility Pharyngeal Material enters airway, remains ABOVE vocal cords and not ejected out Pharyngeal- Thin Cup -- Pharyngeal -- Pharyngeal- Thin Straw -- Pharyngeal -- Pharyngeal- Puree -- Pharyngeal -- Pharyngeal- Mechanical Soft -- Pharyngeal -- Pharyngeal- Regular -- Pharyngeal -- Pharyngeal- Multi-consistency -- Pharyngeal -- Pharyngeal- Pill -- Pharyngeal -- Pharyngeal Comment --  CHL IP CERVICAL ESOPHAGEAL PHASE 04/15/2018 Cervical Esophageal Phase WFL Pudding Teaspoon -- Pudding Cup -- Honey Teaspoon -- Honey Cup -- Nectar Teaspoon -- Nectar Cup -- Nectar Straw -- Thin Teaspoon -- Thin Cup -- Thin Straw -- Puree -- Mechanical Soft -- Regular -- Multi-consistency -- Pill -- Cervical Esophageal Comment -- Shanika I. Vear ClockPhillips, MS, CCC-SLP Acute Rehabilitation Services Office number [phone] Pager [phone] Scheryl MartenShanika I Phillips 04/15/2018, 3:37 PM              Medical  Problem List and Plan: 1.  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  Admit to CIR. 2.  DVT Prophylaxis/Anticoagulation:  subcutaneous Lovenox initiated 04/15/2018. 3. Pain Management:  Neurontin 300 mg twice a day. 4. Mood:  Provide emotional support 5. Neuropsych: This patient is ?fully capable of making decisions on his own behalf. 6. Skin/Wound Care:  Routine skin checks 7. Fluids/Electrolytes/Nutrition:  Routine ins and outs with follow-up chemistries.Hypokalemia. Add potassium 2 days 8. Seizure prophylaxis. Keppra 500 mg twice a day. EEG negative 9. Acute onset atrial fibrillation. Amiodarone discontinued. Cardizem 30 mg every 6 hours. Cardiac rate controlled 10.Dyshagia.Marland KitchenDysphagia 1 nectar thick liquids. Follow-up speech therapy. Advance diet as tolerated.  10. 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 Patient does have a prosthesis. 11. History of aortic valve stenosis status post transcatheter aortic valve replacement 01/22/2017. Patient currently remains on Plavix as prior to admission. Aspirin on hold due to hematoma. 12. Hypothyroidism. Synthroid 13. Hyperlipidemia. Zocor 14. BPH. Flomax. Monitor bladder function.   Post Admission Physician Evaluation: 1. Preadmission assessment reviewed and changes made below. 2. Functional deficits secondary  to bilateral SDH. 3. Patient is admitted to receive collaborative, interdisciplinary care between the physiatrist, rehab nursing staff, and therapy team. 4. Patient's level of medical complexity and substantial therapy needs in context of that medical necessity cannot be provided at a lesser intensity of care such as a SNF. 5. Patient has experienced substantial functional loss from his/her baseline which was documented above under the "Functional History" and "Functional Status" headings.  Judging by the patient's diagnosis, physical exam, and functional history, the patient has potential for functional progress which will result in measurable gains while on inpatient rehab.  These gains will be of substantial  and practical use upon discharge  in facilitating mobility and self-care at the household level. 6. Physiatrist will provide 24 hour management of medical needs as well as oversight of the therapy plan/treatment and provide guidance as appropriate regarding the interaction of the two. 7. 24 hour rehab nursing will assist with bladder management, bowel management, safety, disease management, medication administration and patient education  and help integrate therapy concepts, techniques,education, etc. 8. PT will assess and treat for/with: Lower extremity strength, range of motion, stamina, balance, functional mobility, safety, adaptive techniques and equipment , coping skills, pain control, education. Goals are: Supervision/Min A. 9. OT will assess and treat for/with: ADL's, functional mobility, safety, upper extremity strength, adaptive techniques and equipment, ego support, and community reintegration.   Goals are: Supervision/Min A. Therapy may proceed with showering this patient. 10. SLP will assess and treat for/with: swallowing, cognition.  Goals are: Supervision/Mod I. 11. Case Management and Social Worker will assess and treat for psychological issues and discharge planning. 12. Team conference will be held weekly to assess progress toward goals and to determine barriers to discharge. 13. Patient will receive at least 3 hours of therapy per day at least 5 days per week. 14. ELOS: 10-14 days.       15. Prognosis:  good  I have personally performed a face to face diagnostic evaluation, including, but not limited to relevant history and physical exam findings, of this patient and developed relevant assessment and plan.  Additionally, I have reviewed and concur with the physician assistant's documentation above.  Maryla Morrow, MD, ABPMR Mcarthur Rossetti Angiulli, PA-C 04/16/2018

## 2018-04-17 ENCOUNTER — Inpatient Hospital Stay (HOSPITAL_COMMUNITY): Payer: Self-pay | Admitting: Speech Pathology

## 2018-04-17 ENCOUNTER — Inpatient Hospital Stay (HOSPITAL_COMMUNITY): Payer: Self-pay | Admitting: Physical Therapy

## 2018-04-17 ENCOUNTER — Inpatient Hospital Stay (HOSPITAL_COMMUNITY): Payer: Self-pay | Admitting: Occupational Therapy

## 2018-04-17 DIAGNOSIS — Z298 Encounter for other specified prophylactic measures: Secondary | ICD-10-CM

## 2018-04-17 DIAGNOSIS — R1310 Dysphagia, unspecified: Secondary | ICD-10-CM

## 2018-04-17 DIAGNOSIS — S065X9A Traumatic subdural hemorrhage with loss of consciousness of unspecified duration, initial encounter: Secondary | ICD-10-CM

## 2018-04-17 DIAGNOSIS — I480 Paroxysmal atrial fibrillation: Secondary | ICD-10-CM

## 2018-04-17 DIAGNOSIS — D638 Anemia in other chronic diseases classified elsewhere: Secondary | ICD-10-CM

## 2018-04-17 DIAGNOSIS — Z89511 Acquired absence of right leg below knee: Secondary | ICD-10-CM

## 2018-04-17 LAB — COMPREHENSIVE METABOLIC PANEL
ALBUMIN: 2.8 g/dL — AB (ref 3.5–5.0)
ALK PHOS: 64 U/L (ref 38–126)
ALT: 17 U/L (ref 0–44)
ANION GAP: 10 (ref 5–15)
AST: 16 U/L (ref 15–41)
BUN: 13 mg/dL (ref 8–23)
CO2: 22 mmol/L (ref 22–32)
Calcium: 8.7 mg/dL — ABNORMAL LOW (ref 8.9–10.3)
Chloride: 106 mmol/L (ref 98–111)
Creatinine, Ser: 0.99 mg/dL (ref 0.61–1.24)
GFR calc Af Amer: >60 mL/min (ref >60)
GFR calc non Af Amer: >60 mL/min (ref >60)
GLUCOSE: 122 mg/dL — AB (ref 70–99)
Potassium: 3.7 mmol/L (ref 3.5–5.1)
SODIUM: 138 mmol/L (ref 135–145)
Total Bilirubin: 0.8 mg/dL (ref 0.3–1.2)
Total Protein: 6.8 g/dL (ref 6.5–8.1)

## 2018-04-17 LAB — CBC WITH DIFFERENTIAL/PLATELET
Abs Immature Granulocytes: 0.06 10*3/uL (ref 0.00–0.07)
Basophils Absolute: 0 10*3/uL (ref 0.0–0.1)
Basophils Relative: 1 %
Eosinophils Absolute: 0.2 10*3/uL (ref 0.0–0.5)
Eosinophils Relative: 3 %
HCT: 33 % — ABNORMAL LOW (ref 39.0–52.0)
Hemoglobin: 10.2 g/dL — ABNORMAL LOW (ref 13.0–17.0)
Immature Granulocytes: 1 %
Lymphocytes Relative: 23 %
Lymphs Abs: 1.7 10*3/uL (ref 0.7–4.0)
MCH: 21.4 pg — ABNORMAL LOW (ref 26.0–34.0)
MCHC: 30.9 g/dL (ref 30.0–36.0)
MCV: 69.3 fL — ABNORMAL LOW (ref 80.0–100.0)
Monocytes Absolute: 0.8 10*3/uL (ref 0.1–1.0)
Monocytes Relative: 11 %
Neutro Abs: 4.8 10*3/uL (ref 1.7–7.7)
Neutrophils Relative %: 61 %
Platelets: 248 10*3/uL (ref 150–400)
RBC: 4.76 MIL/uL (ref 4.22–5.81)
RDW: 15.5 % (ref 11.5–15.5)
WBC: 7.7 10*3/uL (ref 4.0–10.5)
nRBC: 0 % (ref 0.0–0.2)

## 2018-04-17 NOTE — Progress Notes (Deleted)
Resting, respirations even and unlabored   upon rounding.eyes closed, appears asleep most of shift, no acute distress. Continue PD w/o complications and toward. Continue to assist with general needs and ADL care, Monitor, call bell within reach

## 2018-04-17 NOTE — Progress Notes (Signed)
Patient resting throughout shift in no apparent distress or discomfort.Closely monitoring and assisted with general care needs. SR up x3, call bell within reach. Denies pains.

## 2018-04-17 NOTE — Evaluation (Addendum)
Speech Language Pathology Assessment and Plan  Patient Details  Name: Charles Patrick MRN: [id number] Date of Birth: 1924-03-25  SLP Diagnosis: Dysphagia;Cognitive Impairments  Rehab Potential: Good ELOS: 2 weeks    Today's Date: 04/17/2018 SLP Individual Time: 7619-5093 SLP Individual Time Calculation (min): 60 min   Problem List:  Patient Active Problem List   Diagnosis Date Noted  . Anemia, chronic disease   . PAF (paroxysmal atrial fibrillation) ([place])   . Seizure disorder ([place]) 04/16/2018  . Atrial fibrillation ([place]) 04/16/2018  . Bilateral subdural hematomas ([place]) 04/16/2018  . Neuropathic pain   . Seizure prophylaxis   . Dysphagia   . S/P BKA (below knee amputation) unilateral, right ([place])   . Benign prostatic hyperplasia   . Malnutrition of moderate degree 04/12/2018  . SDH (subdural hematoma) ([place]) 04/11/2018  . Below knee amputation status, right   . Anemia of chronic disease   . Anemia   . 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 HTN (hypertension) 10/08/2016  . HLD (hyperlipidemia) 10/08/2016  . Hypothyroidism 10/08/2016   Past Medical History:  Past Medical History:  Diagnosis Date  . Anemia   . Aortic valve stenosis   . Arthropathy   . BPH associated with nocturia   . Carotid artery disease ([place])   . Colon polyp   . Dilated aortic root ([place])   . Elevated PSA   . Erectile dysfunction   . Gastric paresis   . Hypercholesterolemia   . Hyperglycemia   . Hypertension    Patient denies  . Hypothyroid   . Iron deficiency   . Pulmonary hypertension ([place])   . 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 Seizure disorder ([place])   . Thrombocytopenia ([place])   . Thyroid nodule   . Tricuspid regurgitation   . Vitamin D deficiency    Past Surgical History:  Past Surgical History:  Procedure Laterality Date  .  AMPUTATION Right 08/19/2017   Procedure: AMPUTATION BELOW KNEE;  Surgeon: Altamese Maumee, MD;  Location: [facility];  Service: Orthopedics;  Laterality: Right;  . EXTERNAL FIXATION LEG Right 08/14/2017   Procedure: EXTERNAL FIXATION LEG;  Surgeon: Wylene Simmer, MD;  Location: [facility];  Service: Orthopedics;  Laterality: Right;  . I&D EXTREMITY Right 08/16/2017   Procedure: IRRIGATION AND DEBRIDEMENT EXTREMITY;  Surgeon: Altamese Dendron, MD;  Location: [facility];  Service: Orthopedics;  Laterality: Right;  . INCISION AND DRAINAGE Right 08/14/2017   Procedure: IRRIGATION AND DEBRIDMENT RIGHT ANKLE, APPLICATION WOUND VAC;  Surgeon: Wylene Simmer, MD;  Location: [facility];  Service: Orthopedics;  Laterality: Right;  . MULTIPLE EXTRACTIONS WITH ALVEOLOPLASTY N/A 10/17/2016   Procedure: Extraction of tooth #'s 4, 21,22,27,28,29 with alveoloplasty;  Surgeon: Lenn Cal, DDS;  Location: [facility];  Service: Oral Surgery;  Laterality: N/A;  . NO PAST SURGERIES    . RIGHT/LEFT HEART CATH AND CORONARY ANGIOGRAPHY N/A 01/11/2017   Procedure: RIGHT/LEFT HEART CATH AND CORONARY ANGIOGRAPHY;  Surgeon: Sherren Mocha, MD;  Location: [facility];  Service: Cardiovascular;  Laterality: N/A;  . TEE WITHOUT CARDIOVERSION N/A 01/22/2017   Procedure: TRANSESOPHAGEAL ECHOCARDIOGRAM (TEE);  Surgeon: Sherren Mocha, MD;  Location: [facility];  Service: Open Heart Surgery;  Laterality: N/A;  . TRANSCATHETER AORTIC VALVE REPLACEMENT, TRANSFEMORAL N/A 01/22/2017   Procedure: TRANSCATHETER AORTIC VALVE REPLACEMENT, TRANSFEMORAL using Edwards 29 Sapien 3 Aortic Valve;  Surgeon: Sherren Mocha, MD;  Location: [facility];  Service: Open Heart Surgery;  Laterality: N/A;    Assessment / Plan / Recommendation Clinical Impression CILLIAN GWINNER is an 85 year old right-handed male with history of chronic anemia, blindness right eye, aortic valve stenosis,  transcatheter aortic valve replacement 01/22/2017 maintained on aspirin and Plavix,  hypertension, as well as a complex open right distal tibia and fibula fracture requiring external fixator with irrigation and debridement as well as removal of bone and tendon with ongoing wound care ischemic changes and underwent right below-knee amputation 08/20/2018 per Dr. Marcelino Scot and was discharged to skilled nursing facility after being denied inpatient rehabilitation services by insurance. Presently patient lives with spouse. One level home with one step to entry. He was using a walker prior to admission as well as a prosthesis. Presented 04/11/2018 after being found unresponsive in the bed by family. Cranial CT scan reviewed, showing b/l SDH.  Per report, showed bilateral primarily low density subdural collection along the cerebral convexities measuring up to 9 mm in thickness. Neurosurgery Dr. Dierdre Harness consulted and advised conservative care. Patient did require intubation for airway protection. He was extubated 04/14/2018. Dysphagia 1 nectar thick liquids.Therapy evaluations completed with recommendations of physical medicine rehabilitation consult. Patient was admitted for a comprehensive rehabilitation program on 04/16/18.  Comprehensive bedside swallow and cognitive linguistic evaluations completed on 04/17/18. Pt presents with mild pharyngeal dysphagia as evidenced by subtle throat clears when conuming puree and nectar thick liquids via cup. Likely related to pharyngeal residue. Cognitive baseline is unknown. However pt was oriented x 4, he was able to perform basic task of tray set-tup and self-feed.  He demonstrates deficits in sustained attetion, short term memory, decreased awareness of deicits, decreased safety awareness, and overall decreased problem solving. Additionally, pt demonstrates moderate oropharyngeal dysphagia c/b decreased mastication of solids and throat clears when consuming nectar thick liuids likely d/t pharyngeal residue. Recent MBS recommended use fo chin tuck but pt is not able  to recall or implement successfully. Skilled ST is required to target the above mentioned deficits to increase functional independence and reduce caregiver burden.    Skilled Therapeutic Interventions          Skilled treatment session focused on completion of above mentioned assessments. SLP further faciltiated session by providing Max A cues to cease talking while cnsuming PO intake. Mod  cues to alternate liquids with puree. Consistent subtle throat clears were observed likely related to pharyngeal residue. Max A cues required to control rate and bolus size. Additionally, pt is perseverative on recent bilateral hematomas and is very difficult to redirect to task at hand.    SLP Assessment  Patient will need skilled Speech Lanaguage Pathology Services during CIR admission    Recommendations  SLP Diet Recommendations: Dysphagia 1 (Puree);Nectar Liquid Administration via: Cup Medication Administration: Crushed with puree Supervision: Full supervision/cueing for compensatory strategies Compensations: Slow rate;Small sips/bites;Multiple dry swallows after each bite/sip;Chin tuck;Follow solids with liquid Postural Changes and/or Swallow Maneuvers: Chin tuck;Upright 30-60 min after meal;Seated upright 90 degrees Oral Care Recommendations: Oral care BID Patient destination: Home Follow up Recommendations: 24 hour supervision/assistance;Home Health SLP Equipment Recommended: To be determined    SLP Frequency 3 to 5 out of 7 days   SLP Duration  SLP Intensity  SLP Treatment/Interventions 2 weeks  Minumum of 1-2 x/day, 30 to 90 minutes  Functional tasks;Patient/family education;Dysphagia/aspiration precaution training    Pain Pain Assessment Pain Scale: 0-10 Pain Score: 0-No pain  Prior Functioning Cognitive/Linguistic Baseline: Information not available Type  of Home: House  Lives With: Spouse;Son Available Help at Discharge: Family;Available 24 hours/day Vocation: Retired  Industrial/product designer  Term Goals: Week 1: SLP Short Term Goal 1 (Week 1): Pt will consume current diet with Mod A cues for use of compensatory swallow strategies.  SLP Short Term Goal 2 (Week 1): Pt will consume trials of dysphagia 2 with Mod A cues for use of compensatory swallow strategies to demonstrate readiness for diet upgrade.  SLP Short Term Goal 3 (Week 1): Pt will sustain attention to task for 5 minutes with Mod A cues.  SLP Short Term Goal 4 (Week 1): Pt will complete basic familiar problem solving tasks with Mod A cues.  SLP Short Term Goal 5 (Week 1): Pt will utilize compensatory memory aids to recall current information with Mod A cues.   Refer to Care Plan for Long Term Goals  Recommendations for other services: None   Discharge Criteria: Patient will be discharged from SLP if patient refuses treatment 3 consecutive times without medical reason, if treatment goals not met, if there is a change in medical status, if patient makes no progress towards goals or if patient is discharged from hospital.  The above assessment, treatment plan, treatment alternatives and goals were discussed and mutually agreed upon: by patient  Kynnedi Zweig 04/17/2018, 10:39 AM

## 2018-04-17 NOTE — Evaluation (Signed)
Physical Therapy Assessment and Plan  Patient Details  Name: Charles Patrick MRN: [id number] Date of Birth: 1925/01/16  PT Diagnosis: Abnormal posture, Abnormality of gait, Difficulty walking and Impaired cognition Rehab Potential: Good ELOS: 10-14 days     Today's Date: 04/17/2018 PT Individual Time: 4270-6237 PT Individual Time Calculation (min): 75 min    Problem List:  Patient Active Problem List   Diagnosis Date Noted  . Anemia, chronic disease   . PAF (paroxysmal atrial fibrillation) ([place])   . Seizure disorder ([place]) 04/16/2018  . Atrial fibrillation ([place]) 04/16/2018  . Bilateral subdural hematomas ([place]) 04/16/2018  . Neuropathic pain   . Seizure prophylaxis   . Dysphagia   . S/P BKA (below knee amputation) unilateral, right ([place])   . Benign prostatic hyperplasia   . Malnutrition of moderate degree 04/12/2018  . SDH (subdural hematoma) ([place]) 04/11/2018  . Below knee amputation status, right   . Anemia of chronic disease   . Anemia   . 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 HTN (hypertension) 10/08/2016  . HLD (hyperlipidemia) 10/08/2016  . Hypothyroidism 10/08/2016    Past Medical History:  Past Medical History:  Diagnosis Date  . Anemia   . Aortic valve stenosis   . Arthropathy   . BPH associated with nocturia   . Carotid artery disease ([place])   . Colon polyp   . Dilated aortic root ([place])   . Elevated PSA   . Erectile dysfunction   . Gastric paresis   . Hypercholesterolemia   . Hyperglycemia   . Hypertension    Patient denies  . Hypothyroid   . Iron deficiency   . Pulmonary hypertension ([place])   . 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 Seizure disorder ([place])   . Thrombocytopenia ([place])   . Thyroid nodule   . Tricuspid regurgitation   . Vitamin D deficiency    Past Surgical History:  Past Surgical  History:  Procedure Laterality Date  . AMPUTATION Right 08/19/2017   Procedure: AMPUTATION BELOW KNEE;  Surgeon: Altamese Clinchport, MD;  Location: [place];  Service: Orthopedics;  Laterality: Right;  . EXTERNAL FIXATION LEG Right 08/14/2017   Procedure: EXTERNAL FIXATION LEG;  Surgeon: Wylene Simmer, MD;  Location: [place];  Service: Orthopedics;  Laterality: Right;  . I&D EXTREMITY Right 08/16/2017   Procedure: IRRIGATION AND DEBRIDEMENT EXTREMITY;  Surgeon: Altamese Poole, MD;  Location: [place];  Service: Orthopedics;  Laterality: Right;  . INCISION AND DRAINAGE Right 08/14/2017   Procedure: IRRIGATION AND DEBRIDMENT RIGHT ANKLE, APPLICATION WOUND VAC;  Surgeon: Wylene Simmer, MD;  Location: [place];  Service: Orthopedics;  Laterality: Right;  . MULTIPLE EXTRACTIONS WITH ALVEOLOPLASTY N/A 10/17/2016   Procedure: Extraction of tooth #'s 4, 21,22,27,28,29 with alveoloplasty;  Surgeon: Lenn Cal, DDS;  Location: [place];  Service: Oral Surgery;  Laterality: N/A;  . NO PAST SURGERIES    . RIGHT/LEFT HEART CATH AND CORONARY ANGIOGRAPHY N/A 01/11/2017   Procedure: RIGHT/LEFT HEART CATH AND CORONARY ANGIOGRAPHY;  Surgeon: Sherren Mocha, MD;  Location: [facility];  Service: Cardiovascular;  Laterality: N/A;  . TEE WITHOUT CARDIOVERSION N/A 01/22/2017   Procedure: TRANSESOPHAGEAL ECHOCARDIOGRAM (TEE);  Surgeon: Sherren Mocha, MD;  Location: [place];  Service: Open Heart Surgery;  Laterality: N/A;  . TRANSCATHETER AORTIC VALVE REPLACEMENT, TRANSFEMORAL N/A 01/22/2017   Procedure: TRANSCATHETER AORTIC VALVE REPLACEMENT, TRANSFEMORAL using Edwards 29  Sapien 3 Aortic Valve;  Surgeon: Sherren Mocha, MD;  Location: [place];  Service: Open Heart Surgery;  Laterality: N/A;    Assessment & Plan Clinical Impression: Patient is a 85 year old right-handed male with history of chronic anemia,blindness right eye, aortic valve stenosis,  transcatheter aortic valve replacement 01/22/2017 maintained on aspirin and  Plavix, hypertension, as well as a complex open right distal tibia and fibula fracture requiring external fixator with irrigation and debridement as well as removal of bone and tendon with ongoing wound care ischemic changes and underwent right below-knee amputation 08/20/2018 per Dr. Marcelino Scot and was discharged to skilled nursing facility after being denied inpatient rehabilitation services by insurance. History taken from chart review, patient, and family.  Presently patient lives with spouse. One level home with one step to entry. He was using a walker prior to admission as well as a prosthesis. Presented 04/11/2018 after being found unresponsive in the bed by family. At the request of 911 he was lowered to the floor by family. Family denies any recent trauma. Cranial CT scan reviewed, showing b/l SDH.  Per report, showed bilateral primarily low density subdural collection along the cerebral convexities measuring up to 9 mm in thickness. No significant cortical mass effect or midline shift. No evidence for infarct. Troponin negative. Neurosurgery Dr. Dierdre Harness consulted and advised conservative care. Patient did require intubation for airway protection. Follow-up cranial CT scan remained stable. EEG negative for seizure. He was extubated 04/14/2018. Currently maintained on Keppra for seizure prophylaxis. Subcutaneous Lovenox for DVT prophylaxis 04/15/2018. Patient did develop run of atrial fibrillation initially on amiodarone transition to cardiac exam.Hypokalemia with supplement added. Mild leukocytosis 15,000. Foley catheter tube plan to be removed 04/16/2018. Dysphagia 1 nectar thick liquids  Patient transferred to CIR on 04/16/2018 .   Patient currently requires mod with mobility secondary to muscle weakness and muscle joint tightness, decreased visual acuity, decreased attention, decreased awareness, decreased problem solving, decreased safety awareness, decreased memory and delayed processing and decreased  sitting balance, decreased standing balance and decreased balance strategies.  Prior to hospitalization, patient was modified independent  with mobility and lived with Spouse, Son in a House home.  Home access is 1Stairs to enter.  Patient will benefit from skilled PT intervention to maximize safe functional mobility, minimize fall risk and decrease caregiver burden for planned discharge home with 24 hour supervision.  Anticipate patient will benefit from follow up [place] at discharge.  PT - End of Session Activity Tolerance: Tolerates < 10 min activity, no significant change in vital signs Endurance Deficit: Yes PT Assessment Rehab Potential (ACUTE/IP ONLY): Good PT Barriers to Discharge: Inaccessible home environment;Decreased caregiver support;Medication compliance;Behavior PT Patient demonstrates impairments in the following area(s): Balance;Behavior;Endurance;Nutrition;Pain;Safety;Skin Integrity PT Transfers Functional Problem(s): Bed to Chair;Bed Mobility;Car;Furniture PT Locomotion Functional Problem(s): Ambulation;Wheelchair Mobility PT Plan PT Intensity: Minimum of 1-2 x/day ,45 to 90 minutes PT Frequency: 5 out of 7 days PT Duration Estimated Length of Stay: 10-14 days  PT Treatment/Interventions: Ambulation/gait training;Cognitive remediation/compensation;Discharge planning;DME/adaptive equipment instruction;Functional mobility training;Pain management;Psychosocial support;Splinting/orthotics;Therapeutic Activities;Visual/perceptual remediation/compensation;UE/LE Strength taining/ROM;Wheelchair propulsion/positioning;Therapeutic Exercise;UE/LE Coordination activities;Stair training;Skin care/wound management;Neuromuscular re-education;Patient/family education;Disease management/prevention;Community reintegration;Balance/vestibular training PT Transfers Anticipated Outcome(s): Supervision assist with LRAD  PT Locomotion Anticipated Outcome(s): Supervision assist WC mobility. CGA ambulation  with RW  PT Recommendation Follow Up Recommendations: Home health PT Patient destination: Home Equipment Recommended: To be determined  Skilled Therapeutic Intervention Pt received supine in bed and agreeable to PT. Supine>sit transfer with supervision assist and min cues  For safety. Pt  instructed in donning R prothesis with moderate cues due to poor processing and awareness, noted with attempting to don liner on L foot. PT instructed patient in PT Evaluation and initiated treatment intervention; see below for results. PT educated patient in [place], rehab potential, rehab goals, and discharge recommendations. Car transfer completed with mod assist as well as all sit<>stand and stand pivot transfers due to poor posture, step length, and safety with AD. Patient returned to room and left sitting in [facility] with call bell in reach, belt alarm donned and activated, and all needs met.       PT Evaluation Precautions/Restrictions   General   Vital Signs  Pain Pain Assessment Pain Scale: 0-10 Pain Score: 0-No pain Home Living/Prior Functioning Home Living Available Help at Discharge: Family;Available 24 hours/day Type of Home: House Home Access: Stairs to enter [employer] of Steps: 1 Home Layout: One level Bathroom Shower/Tub: Chiropodist: Handicapped height  Lives With: Spouse;Son Prior Function Level of Independence: Requires assistive device for independence  Able to Take Stairs?: Yes Driving: Yes Vocation: Retired Comments: pt reports independent ADLs, using RW for mobility but no family present to confirm (inconsistent history, initally reporting not using any AD) Vision/Perception  Perception Perception: Within Functional Limits Praxis Praxis: Intact  Cognition Overall Cognitive Status: Impaired/Different from baseline Arousal/Alertness: Awake/alert Orientation Level: Oriented X4 Attention: Sustained Sustained Attention: Impaired Sustained  Attention Impairment: Verbal basic;Functional basic Memory: Impaired Memory Impairment: Storage deficit;Decreased recall of new information;Decreased short term memory Awareness: Impaired Awareness Impairment: Intellectual impairment;Emergent impairment;Anticipatory impairment Problem Solving: Impaired Problem Solving Impairment: Verbal basic;Functional basic Executive Function: (all impaired d/t lower level deficits) Behaviors: Impulsive;Perseveration Safety/Judgment: Impaired Sensation Sensation Light Touch: Appears Intact Proprioception: Appears Intact Coordination Gross Motor Movements are Fluid and Coordinated: Yes Fine Motor Movements are Fluid and Coordinated: Yes Finger Nose Finger Test: [facility] Bil  Motor  Motor Motor: Other (comment) Motor - Skilled Clinical Observations: Generalized weakness   Mobility Bed Mobility Bed Mobility: Rolling Right;Rolling Left;Sit to Supine;Supine to Sit Rolling Right: Supervision/verbal cueing Rolling Left: Supervision/Verbal cueing Supine to Sit: Supervision/Verbal cueing Sit to Supine: Supervision/Verbal cueing Transfers Transfers: Sit to Stand;Stand Pivot Transfers Sit to Stand: Moderate Assistance - Patient 50-74% Stand Pivot Transfers: Moderate Assistance - Patient 50 - 74% Transfer (Assistive device): Rolling walker Locomotion  Gait Ambulation: Yes Gait Assistance: Moderate Assistance - Patient 50-74% Gait Distance (Feet): 75 Feet Assistive device: Rolling walker Gait Assistance Details: Tactile cues for placement;Tactile cues for weight beaing;Verbal cues for sequencing;Verbal cues for technique;Verbal cues for safe use of DME/AE;Verbal cues for precautions/safety Gait Gait: Yes Gait Pattern: Left flexed knee in stance;Right flexed knee in stance;Narrow base of support Stairs / Additional Locomotion Stairs: Yes Stairs Assistance: Moderate Assistance - Patient 50 - 74% Stair Management Technique: Two rails Number of Stairs:  2 Height of Stairs: 3 Wheelchair Mobility Wheelchair Mobility: Yes Wheelchair Assistance: Minimal assistance - Patient >75% Wheelchair Propulsion: Both upper extremities Wheelchair Parts Management: Needs assistance Distance: 168f  Trunk/Postural Assessment  Cervical Assessment Cervical Assessment: Exceptions to WFL(forward head) Thoracic Assessment Thoracic Assessment: Exceptions to WFL(rounded shoulders.) Lumbar Assessment Lumbar Assessment: Exceptions to WFL(decreased lordosis) Postural Control Postural Control: Within Functional Limits  Balance Balance Balance Assessed: Yes Dynamic Sitting Balance Dynamic Sitting - Level of Assistance: 5: Stand by assistance Sitting balance - Comments: while donning prothesis. Static Standing Balance Static Standing - Level of Assistance: 4: Min assist Dynamic Standing Balance Dynamic Standing - Level of Assistance: 3: Mod assist(with UE support )  Extremity Assessment      RLE Assessment RLE Assessment: Exceptions to [facility] Passive Range of Motion (PROM) Comments: lacking ~12 deg full extension  Active Range of Motion (AROM) Comments: lacking ~15 deg full extension  General Strength Comments: grossly 4/5 proximal to distal BKA LLE Assessment LLE Assessment: Exceptions to [facility] Passive Range of Motion (PROM) Comments: lacking ~10 deg full ext  Active Range of Motion (AROM) Comments: lack ~ 15 deg full ext  General Strength Comments: grossly 4+/5 proximal to distal     Refer to Care Plan for Long Term Goals  Recommendations for other services: None   Discharge Criteria: Patient will be discharged from PT if patient refuses treatment 3 consecutive times without medical reason, if treatment goals not met, if there is a change in medical status, if patient makes no progress towards goals or if patient is discharged from hospital.  The above assessment, treatment plan, treatment alternatives and goals were discussed and mutually agreed  upon: by patient  [name] 04/17/2018, 10:36 AM

## 2018-04-17 NOTE — Evaluation (Signed)
Occupational Therapy Assessment and Plan  Patient Details  Name: Charles Patrick MRN: [id number] Date of Birth: [birth year]  OT Diagnosis: abnormal posture, cognitive deficits and muscle weakness (generalized) Rehab Potential: Rehab Potential (ACUTE ONLY): Excellent ELOS: 7-10 days   Today's Date: 04/17/2018 OT Individual Time: 1102-1203 OT Individual Time Calculation (min): 61 min     Problem List:  Patient Active Problem List   Diagnosis Date Noted  . Anemia, chronic disease   . PAF (paroxysmal atrial fibrillation) ([place])   . Seizure disorder ([place]) 04/16/2018  . Atrial fibrillation ([place]) 04/16/2018  . Bilateral subdural hematomas ([place]) 04/16/2018  . Neuropathic pain   . Seizure prophylaxis   . Dysphagia   . S/P BKA (below knee amputation) unilateral, right ([place])   . Benign prostatic hyperplasia   . Malnutrition of moderate degree 04/12/2018  . SDH (subdural hematoma) ([place]) 04/11/2018  . Below knee amputation status, right   . Anemia of chronic disease   . Anemia   . 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 HTN (hypertension) 10/08/2016  . HLD (hyperlipidemia) 10/08/2016  . Hypothyroidism 10/08/2016    Past Medical History:  Past Medical History:  Diagnosis Date  . Anemia   . Aortic valve stenosis   . Arthropathy   . BPH associated with nocturia   . Carotid artery disease ([place])   . Colon polyp   . Dilated aortic root ([place])   . Elevated PSA   . Erectile dysfunction   . Gastric paresis   . Hypercholesterolemia   . Hyperglycemia   . Hypertension    Patient denies  . Hypothyroid   . Iron deficiency   . Pulmonary hypertension ([place])   . 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 Seizure disorder ([place])   . Thrombocytopenia ([place])   . Thyroid nodule   . Tricuspid regurgitation   . Vitamin D deficiency    Past Surgical  History:  Past Surgical History:  Procedure Laterality Date  . AMPUTATION Right 08/19/2017   Procedure: AMPUTATION BELOW KNEE;  Surgeon: Altamese [place], MD;  Location: [facility];  Service: Orthopedics;  Laterality: Right;  . EXTERNAL FIXATION LEG Right 08/14/2017   Procedure: EXTERNAL FIXATION LEG;  Surgeon: Wylene Simmer, MD;  Location: [facility];  Service: Orthopedics;  Laterality: Right;  . I&D EXTREMITY Right 08/16/2017   Procedure: IRRIGATION AND DEBRIDEMENT EXTREMITY;  Surgeon: Altamese [place], MD;  Location: [facility];  Service: Orthopedics;  Laterality: Right;  . INCISION AND DRAINAGE Right 08/14/2017   Procedure: IRRIGATION AND DEBRIDMENT RIGHT ANKLE, APPLICATION WOUND VAC;  Surgeon: Wylene Simmer, MD;  Location: [facility];  Service: Orthopedics;  Laterality: Right;  . MULTIPLE EXTRACTIONS WITH ALVEOLOPLASTY N/A 10/17/2016   Procedure: Extraction of tooth #'s 4, 21,22,27,28,29 with alveoloplasty;  Surgeon: Lenn Cal, DDS;  Location: [facility];  Service: Oral Surgery;  Laterality: N/A;  . NO PAST SURGERIES    . RIGHT/LEFT HEART CATH AND CORONARY ANGIOGRAPHY N/A 01/11/2017   Procedure: RIGHT/LEFT HEART CATH AND CORONARY ANGIOGRAPHY;  Surgeon: Sherren Mocha, MD;  Location: [facility];  Service: Cardiovascular;  Laterality: N/A;  . TEE WITHOUT CARDIOVERSION N/A 01/22/2017   Procedure: TRANSESOPHAGEAL ECHOCARDIOGRAM (TEE);  Surgeon: Sherren Mocha, MD;  Location: [facility];  Service: Open Heart Surgery;  Laterality: N/A;  . TRANSCATHETER AORTIC VALVE REPLACEMENT, TRANSFEMORAL N/A 01/22/2017   Procedure: TRANSCATHETER AORTIC VALVE REPLACEMENT, TRANSFEMORAL using Oletta Lamas  92 Sapien 3 Aortic Valve;  Surgeon: Sherren Mocha, MD;  Location: [facility];  Service: Open Heart Surgery;  Laterality: N/A;    Assessment & Plan Clinical Impression: Patient is a 85 y.o. year old male with recent admission to the hospital on 04/11/2018 after being found unresponsive in the bed by family. At the request of 911 he was  lowered to the floor by family. Family denies any recent trauma. Cranial CT scan reviewed, showing b/l SDH.  Per report, showed bilateral primarily low density subdural collection along the cerebral convexities measuring up to 9 mm in thickness. No significant cortical mass effect or midline shift. No evidence for infarct. Troponin negative. Neurosurgery Dr. Dierdre Harness consulted and advised conservative care. Patient did require intubation for airway protection. Follow-up cranial CT scan remained stable. EEG negative for seizure. He was extubated 04/14/2018. Patient transferred to CIR on 04/16/2018 .    Patient currently requires mod with basic self-care skills secondary to muscle weakness, decreased attention, decreased awareness, decreased problem solving, decreased safety awareness and decreased memory and decreased standing balance and decreased balance strategies.  Prior to hospitalization, patient could complete ADLs with modified independent .  Patient will benefit from skilled intervention to decrease level of assist with basic self-care skills and increase independence with basic self-care skills prior to discharge home with care partner.  Anticipate patient will require 24 hour supervision and follow up home health.  OT - End of Session Activity Tolerance: Improving Endurance Deficit: Yes OT Assessment Rehab Potential (ACUTE ONLY): Excellent OT Patient demonstrates impairments in the following area(s): Balance;Cognition;Endurance OT Basic ADL's Functional Problem(s): Grooming;Bathing;Dressing;Toileting OT Advanced ADL's Functional Problem(s): Simple Meal Preparation OT Transfers Functional Problem(s): Toilet;Tub/Shower OT Additional Impairment(s): None OT Plan OT Intensity: Minimum of 1-2 x/day, 45 to 90 minutes OT Frequency: 5 out of 7 days OT Duration/Estimated Length of Stay: 7-10 days OT Treatment/Interventions: Balance/vestibular training;Discharge planning;DME/adaptive equipment  instruction;Functional mobility training;Pain management;Therapeutic Activities;UE/LE Strength taining/ROM;UE/LE Coordination activities;Therapeutic Exercise;Self Care/advanced ADL retraining;Patient/family education;Community reintegration;Cognitive remediation/compensation OT Self Feeding Anticipated Outcome(s): independent OT Basic Self-Care Anticipated Outcome(s): supervision OT Toileting Anticipated Outcome(s): supervision OT Bathroom Transfers Anticipated Outcome(s): supervision OT Recommendation Patient destination: Home Follow Up Recommendations: Home health OT Equipment Recommended: To be determined   Skilled Therapeutic Intervention Pt began working on selfcare retraining at the sink during session.  He completed UB bathing and washed his upper and lower legs only.  He declined washing other parts during the eval.  He completed sit to stand with use of the prosthesis with min assist but it was not locked into his RLE correctly.  He noticed this but instead of sitting back down in the chair to fix it, he instead wanted to keep trying to walk to the bed.  Mod re-direction from therapist to sit back down.  Once he fixed it he was able to ambulate with overall min assist level to the bed.  Therapist donned TED stocking over the LLE and then donned gripper sock with setup.  Pt reports that he usually sits on the shower bench in the shower and leans side to side for washing his bottom.  Pt's grandsons came in during session.  Therapist discussed expectations of LOS 7-10 days and pt needing some supervision for safety at discharge.  They voiced understanding.  Pt transferred back to the wheelchair squat pivot with min assist in preparation for lunch.  Call button and phone in reach with safety alarm belt in place.    OT Evaluation Precautions/Restrictions  Precautions Precautions: Fall Precaution Comments: blind right eye, prosthesis (R BKA) Restrictions Weight Bearing Restrictions:  No  Pain  No report of pain during session Home Living/Prior Functioning Home Living Available Help at Discharge: Family, Available 24 hours/day Type of Home: House Home Access: Stairs to enter [employer] of Steps: 1 Home Layout: One level Bathroom Shower/Tub: Chiropodist: Handicapped height Bathroom Accessibility: Yes  Lives With: Spouse, Sales executive in [employer]) IADL History Homemaking Responsibilities: Yes Meal Prep Responsibility: Secondary Cleaning Responsibility: Secondary Current License: Yes Occupation: Retired Prior Function Level of Independence: Requires assistive device for independence  Able to Take Stairs?: Yes Driving: Yes Vocation: Retired Comments: pt reports independent ADLs, using RW for mobility but no family present to confirm (inconsistent history, initally reporting not using any AD) ADL ADL Eating: Independent Where Assessed-Eating: Wheelchair Grooming: Setup Where Assessed-Grooming: Wheelchair Upper Body Bathing: Setup Where Assessed-Upper Body Bathing: Wheelchair Lower Body Bathing: Moderate assistance Where Assessed-Lower Body Bathing: Wheelchair, Edge of bed Upper Body Dressing: Supervision/safety Where Assessed-Upper Body Dressing: Wheelchair Lower Body Dressing: Moderate assistance Where Assessed-Lower Body Dressing: Wheelchair Toileting: Minimal assistance Where Assessed-Toileting: Bedside Commode Toilet Transfer: Minimal assistance Toilet Transfer Method: Stand pivot Toilet Transfer Equipment: Bedside commode Vision Baseline Vision/History: Wears glasses;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) Wears Glasses: At all times(does not have them here at the hospital) Patient Visual Report: No change from baseline Vision Assessment?: Yes Eye Alignment: Within Functional Limits Ocular Range of Motion: Within Functional Limits Alignment/Gaze Preference: Within Defined Limits Tracking/Visual Pursuits:  Able to track stimulus in all quads without difficulty Visual Fields: No apparent deficits Perception  Perception: Within Functional Limits Praxis Praxis: Intact Cognition Overall Cognitive Status: Impaired/Different from baseline Arousal/Alertness: Awake/alert Orientation Level: Person;Place;Situation Person: Oriented Place: Oriented Situation: Oriented Year: 2020 Month: February Day of Week: Correct Memory: Impaired Memory Impairment: Storage deficit;Decreased short term memory Decreased Short Term Memory: Verbal basic Immediate Memory Recall: Sock;Blue;Bed Memory Recall: Sock;Blue;Bed Memory Recall Sock: Without Cue Memory Recall Blue: Without Cue Memory Recall Bed: Without Cue Attention: Sustained Sustained Attention: Impaired Sustained Attention Impairment: Verbal basic;Functional basic Awareness: Impaired Awareness Impairment: Intellectual impairment;Emergent impairment;Anticipatory impairment Problem Solving: Impaired Problem Solving Impairment: Verbal basic;Functional basic Executive Function: (all impaired d/t lower level deficits) Behaviors: Impulsive Safety/Judgment: Impaired Sensation Sensation Light Touch: Appears Intact Hot/Cold: Appears Intact Proprioception: Appears Intact Stereognosis: Appears Intact Coordination Gross Motor Movements are Fluid and Coordinated: Yes Fine Motor Movements are Fluid and Coordinated: Yes Motor  Motor Motor: Within Functional Limits Motor - Skilled Clinical Observations: Generalized weakness  Mobility  Transfers Sit to Stand: Minimal Assistance - Patient > 75%  Trunk/Postural Assessment  Cervical Assessment Cervical Assessment: Exceptions to WFL(cervical protraction) Thoracic Assessment Thoracic Assessment: Exceptions to WFL(thoracic rounding) Lumbar Assessment Lumbar Assessment: Exceptions to WFL(lumbar flexion in standing) Postural Control Postural Control: Within Functional Limits  Balance Balance Balance  Assessed: Yes Static Sitting Balance Static Sitting - Balance Support: Feet supported Static Sitting - Level of Assistance: 5: Stand by assistance Dynamic Sitting Balance Dynamic Sitting - Balance Support: Feet supported;During functional activity Dynamic Sitting - Level of Assistance: 5: Stand by assistance Static Standing Balance Static Standing - Balance Support: During functional activity;Bilateral upper extremity supported Static Standing - Level of Assistance: 4: Min assist Dynamic Standing Balance Dynamic Standing - Balance Support: During functional activity Dynamic Standing - Level of Assistance: 3: Mod assist Extremity/Trunk Assessment RUE Assessment RUE Assessment: Within Functional Limits LUE Assessment LUE Assessment: Within Functional Limits     Refer to Care  Plan for Long Term Goals  Recommendations for other services: None    Discharge Criteria: Patient will be discharged from OT if patient refuses treatment 3 consecutive times without medical reason, if treatment goals not met, if there is a change in medical status, if patient makes no progress towards goals or if patient is discharged from hospital.  The above assessment, treatment plan, treatment alternatives and goals were discussed and mutually agreed upon: by patient  Tevin Shillingford OTR/L 04/17/2018, 5:02 PM

## 2018-04-17 NOTE — Progress Notes (Signed)
[place] PHYSICAL MEDICINE & REHABILITATION PROGRESS NOTE  Subjective/Complaints: Patient seen sitting up in bed this morning.  States he slept well overnight.  He states he is ready begin therapies.  ROS: Denies CP, shortness of breath, nausea, vomiting, diarrhea.  Objective: Vital Signs: Blood pressure 100/76, pulse 72, temperature 98.6 F (37 C), temperature source Oral, resp. rate 15, height 6\' 4"  (1.93 m), weight 80.1 kg, SpO2 93 %. Dg Swallowing Func-speech Pathology  Result Date: 04/15/2018 Objective Swallowing Evaluation: Type of Study: MBS-Modified Barium Swallow Study  Patient Details Name: Charles Patrick MRN: [id number] Date of Birth: [birth year] Today's Date: 04/15/2018 Time: SLP Start Time (ACUTE ONLY): 1402 -SLP Stop Time (ACUTE ONLY): 1417 SLP Time Calculation (min) (ACUTE ONLY): 15 min Past Medical History: Past Medical History: Diagnosis Date . Anemia  . Aortic valve stenosis  . Arthropathy  . BPH associated with nocturia  . Carotid artery disease (HCC)  . Colon polyp  . Dilated aortic root (HCC)  . Elevated PSA  . Erectile dysfunction  . Gastric paresis  . Hypercholesterolemia  . Hyperglycemia  . Hypertension   Patient denies . Hypothyroid  . Iron deficiency  . Pulmonary hypertension (HCC)  . 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 Seizure disorder (HCC)  . Thrombocytopenia (HCC)  . Thyroid nodule  . Tricuspid regurgitation  . Vitamin D deficiency  Past Surgical History: Past Surgical History: Procedure Laterality Date . AMPUTATION Right 08/19/2017  Procedure: AMPUTATION BELOW KNEE;  Surgeon: Myrene GalasHandy, [name], MD;  [facility];  Service: Orthopedics;  Laterality: Right; . EXTERNAL FIXATION LEG Right 08/14/2017  Procedure: EXTERNAL FIXATION LEG;  Surgeon: Toni ArthursHewitt, John, MD;  Location: [facility] OR;  Service: Orthopedics;  Laterality: Right; . I&D EXTREMITY Right 08/16/2017   Procedure: IRRIGATION AND DEBRIDEMENT EXTREMITY;  Surgeon: Myrene GalasHandy, [name], MD;  [facility];  Service: Orthopedics;  Laterality: Right; . INCISION AND DRAINAGE Right 08/14/2017  Procedure: IRRIGATION AND DEBRIDMENT RIGHT ANKLE, APPLICATION WOUND VAC;  Surgeon: Toni ArthursHewitt, John, MD;  Location: [facility] OR;  Service: Orthopedics;  Laterality: Right; . MULTIPLE EXTRACTIONS WITH ALVEOLOPLASTY N/A 10/17/2016  Procedure: Extraction of tooth #'s 4, 21,22,27,28,29 with alveoloplasty;  Surgeon: [name], [name], DDS;  [facility];  Service: Oral Surgery;  Laterality: N/A; . NO PAST SURGERIES   . RIGHT/LEFT HEART CATH AND CORONARY ANGIOGRAPHY N/A 01/11/2017  Procedure: RIGHT/LEFT HEART CATH AND CORONARY ANGIOGRAPHY;  Surgeon: [name], [name], MD;  [facility];  Service: Cardiovascular;  Laterality: N/A; . TEE WITHOUT CARDIOVERSION N/A 01/22/2017  Procedure: TRANSESOPHAGEAL ECHOCARDIOGRAM (TEE);  Surgeon: [name], [name], MD;  [facility];  Service: Open Heart Surgery;  Laterality: N/A; . TRANSCATHETER AORTIC VALVE REPLACEMENT, TRANSFEMORAL N/A 01/22/2017  Procedure: TRANSCATHETER AORTIC VALVE REPLACEMENT, TRANSFEMORAL using Edwards 29 Sapien 3 Aortic Valve;  Surgeon: [name], [name], MD;  [facility];  Service: Open Heart Surgery;  Laterality: N/A; HPI: Pt is a 85 y.o male found with altered mental status at home. Intubated in the ER on 2/14. The CT of the head of 04/11/18 revealed bilateral primarily low-density subdural collection along the cerebral convexities measuring up to 9 mm in thickness. EEG was negative. Chest x-ray of 04/13/18 showed streaky opacities within the right mid lung region and at the right lung base suggesting pneumonia versus asymmetric edema. Pt was extubated on 04/14/18.  No data recorded Assessment / Plan / Recommendation CHL IP CLINICAL IMPRESSIONS 04/15/2018 Clinical Impression  Pt was seen in radiology suite for modified barium swallow study. He was seated upright in swallow  chair for the study which was conducted in lateral view. Trials of puree, thin liquids, nectar thick liquids, and honey thick liquids were administered. He presents with moderate-severe pharyngeal phase dysphagia. He demonstrated reduced laryngeal excursion which resulted in incomplete epiglottic retroversion, pyriform sinus residue across all consistencies, and consistent penetration of liquids. Reduced lingual retraction resulted in moderate vallecular residue and mild-moderate pharyngeal wall residue was observed due to reduced pharyngeal constriction. No instances of aspiraton were observed but pt is at moderate risk for aspiration if swallowing precautions are not strictly observed. A dysphagia 1 diet with nectar thick liquids is recommended at this time with use of compensatory strategies to reduce aspiration risk. SLP will follow to ensure tolerance of the current diet, his ability to safely tolerate more advanced consistencies and the need for further intervention.  SLP Visit Diagnosis Dysphagia, pharyngeal phase (R13.13) Attention and concentration deficit following -- Frontal lobe and executive function deficit following -- Impact on safety and function Moderate aspiration risk   CHL IP TREATMENT RECOMMENDATION 04/15/2018 Treatment Recommendations Therapy as outlined in treatment plan below   Prognosis 04/15/2018 Prognosis for Safe Diet Advancement Good Barriers to Reach Goals Cognitive deficits;Severity of deficits Barriers/Prognosis Comment -- CHL IP DIET RECOMMENDATION 04/15/2018 SLP Diet Recommendations Dysphagia 1 (Puree) solids;Nectar thick liquid Liquid Administration via Cup;No straw Medication Administration Whole meds with puree Compensations Slow rate;Small sips/bites;Multiple dry swallows after each bite/sip;Chin tuck;Follow solids with liquid Postural Changes Seated upright at 90 degrees   CHL IP OTHER RECOMMENDATIONS 04/15/2018 Recommended Consults -- Oral Care Recommendations Oral care QID Other  Recommendations --   CHL IP FOLLOW UP RECOMMENDATIONS 04/15/2018 Follow up Recommendations Inpatient Rehab   CHL IP FREQUENCY AND DURATION 04/15/2018 Speech Therapy Frequency (ACUTE ONLY) min 2x/week Treatment Duration 2 weeks      CHL IP ORAL PHASE 04/15/2018 Oral Phase WFL Oral - Pudding Teaspoon -- Oral - Pudding Cup -- Oral - Honey Teaspoon -- Oral - Honey Cup -- Oral - Nectar Teaspoon -- Oral - Nectar Cup -- Oral - Nectar Straw -- Oral - Thin Teaspoon -- Oral - Thin Cup -- Oral - Thin Straw -- Oral - Puree -- Oral - Mech Soft -- Oral - Regular -- Oral - Multi-Consistency -- Oral - Pill -- Oral Phase - Comment --  CHL IP PHARYNGEAL PHASE 04/15/2018 Pharyngeal Phase Impaired Pharyngeal- Pudding Teaspoon -- Pharyngeal -- Pharyngeal- Pudding Cup -- Pharyngeal -- Pharyngeal- Honey Teaspoon Reduced epiglottic inversion;Reduced tongue base retraction;Pharyngeal residue - pyriform;Pharyngeal residue - valleculae;Pharyngeal residue - posterior pharnyx;Penetration/Aspiration during swallow;Reduced anterior laryngeal mobility Pharyngeal Material enters airway, remains ABOVE vocal cords then ejected out Pharyngeal- Honey Cup Reduced epiglottic inversion;Reduced tongue base retraction;Pharyngeal residue - pyriform;Pharyngeal residue - valleculae;Pharyngeal residue - posterior pharnyx;Penetration/Aspiration during swallow;Reduced anterior laryngeal mobility Pharyngeal Material enters airway, remains ABOVE vocal cords then ejected out Pharyngeal- Nectar Teaspoon Reduced epiglottic inversion;Reduced tongue base retraction;Pharyngeal residue - pyriform;Pharyngeal residue - valleculae;Pharyngeal residue - posterior pharnyx;Penetration/Aspiration during swallow;Reduced anterior laryngeal mobility Pharyngeal Material enters airway, remains ABOVE vocal cords and not ejected out Pharyngeal- Nectar Cup -- Pharyngeal -- Pharyngeal- Nectar Straw -- Pharyngeal -- Pharyngeal- Thin Teaspoon Reduced epiglottic inversion;Reduced tongue base  retraction;Pharyngeal residue - pyriform;Pharyngeal residue - posterior pharnyx;Penetration/Aspiration during swallow;Reduced anterior laryngeal mobility Pharyngeal Material enters airway, remains ABOVE vocal cords and not ejected out Pharyngeal- Thin Cup -- Pharyngeal -- Pharyngeal- Thin Straw -- Pharyngeal -- Pharyngeal- Puree -- Pharyngeal --  Pharyngeal- Mechanical Soft -- Pharyngeal -- Pharyngeal- Regular -- Pharyngeal -- Pharyngeal- Multi-consistency -- Pharyngeal -- Pharyngeal- Pill -- Pharyngeal -- Pharyngeal Comment --  CHL IP CERVICAL ESOPHAGEAL PHASE 04/15/2018 Cervical Esophageal Phase WFL Pudding Teaspoon -- Pudding Cup -- Honey Teaspoon -- Honey Cup -- Nectar Teaspoon -- Nectar Cup -- Nectar Straw -- Thin Teaspoon -- Thin Cup -- Thin Straw -- Puree -- Mechanical Soft -- Regular -- Multi-consistency -- Pill -- Cervical Esophageal Comment -- Shanika I. Vear Clock, MS, CCC-SLP Acute Rehabilitation Services Office number [phone] Pager [phone] Scheryl Marten 04/15/2018, 3:37 PM              Recent Labs    04/16/18 1706  WBC 8.8  HGB 9.6*  HCT 31.6*  PLT 219   Recent Labs    04/15/18 0439 04/16/18 0501 04/16/18 1706  NA 136 139  --   K 3.5 3.0*  --   CL 104 103  --   CO2 22 23  --   GLUCOSE 113* 107*  --   BUN 16 16  --   CREATININE 1.10 0.97 0.89  CALCIUM 8.5* 8.6*  --     Physical Exam: BP 100/76 (BP Location: Left Arm)   Pulse 72   Temp 98.6 F (37 C) (Oral)   Resp 15   Ht 6\' 4"  (1.93 m)   Wt 80.1 kg   SpO2 93%   BMI 21.50 kg/m  Constitutional: No distress . Vital signs reviewed. HENT: Normocephalic.  Atraumatic. Eyes: EOMI. No discharge. +Opaque right eye Cardiovascular: RRR. No JVD. Respiratory: CTA Bilaterally. Normal effort. +[place] GI: BS +. Non-distended. Musc: No edema or tenderness in extremities. GI: Nondistended. Bowel sounds are normal.  Musculoskeletal: No edema or tenderness in extremities Right BKA  Neurological: He is alert and oriented.   Follows commands.  Fair awareness of deficits. Motor: Grossly 4+/5 throughout Skin: Skin is warm and dry. Right BKA is well healed  Psychiatric: Some confusion  Assessment/Plan: 1. Functional deficits secondary to bilateral subdural hematomas which require 3+ hours per day of interdisciplinary therapy in a comprehensive inpatient rehab setting.  Physiatrist is providing close team supervision and 24 hour management of active medical problems listed below.  Physiatrist and rehab team continue to assess barriers to discharge/monitor patient progress toward functional and medical goals  Care Tool:  Bathing              Bathing assist       Upper Body Dressing/Undressing Upper body dressing        Upper body assist      Lower Body Dressing/Undressing Lower body dressing            Lower body assist       Toileting Toileting    Toileting assist Assist for toileting: Independent with assistive device(pt had used urinal without  my help)     Transfers Chair/bed transfer  Transfers assist           Locomotion Ambulation   Ambulation assist              Walk 10 feet activity   Assist           Walk 50 feet activity   Assist           Walk 150 feet activity   Assist           Walk 10 feet on uneven surface  activity   Assist  Wheelchair     Assist               Wheelchair 50 feet with 2 turns activity    Assist            Wheelchair 150 feet activity     Assist            Medical Problem List and Plan: 1.  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  Begin CIR 2.  DVT Prophylaxis/Anticoagulation: subcutaneous Lovenox initiated 04/15/2018. 3. Pain Management:  Neurontin 300 mg twice a day. 4. Mood:  Provide emotional support 5. Neuropsych: This patient is ?fully capable of making decisions on his own behalf. 6.  Skin/Wound Care:  Routine skin checks 7. Fluids/Electrolytes/Nutrition:  Routine ins and outs   Added potassium 2 days  BMP pending for this morning 8. Seizure prophylaxis. Keppra 500 mg twice a day. EEG negative 9. Acute onset atrial fibrillation. Amiodarone discontinued. Cardizem 30 mg every 6 hours. Cardiac rate controlled 10. Dyshagia: Dysphagia 1 nectar thick liquids. Follow-up speech therapy.   Advance diet as tolerated 10. 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 Patient does have a prosthesis. 11. History of aortic valve stenosis status post transcatheter aortic valve replacement 01/22/2017. Patient currently remains on Plavix as prior to admission. Aspirin on hold due to hematoma. 12. Hypothyroidism. Synthroid 13. Hyperlipidemia. Zocor 14. BPH. Flomax. Monitor bladder function. 15.  Chronic anemia  Hemoglobin 10.2 on 2/20  Continue to monitor   LOS: 1 days A FACE TO FACE EVALUATION WAS PERFORMED  Ankit Karis JubaAnil Patel 04/17/2018, 8:30 AM

## 2018-04-17 NOTE — Progress Notes (Signed)
Per nursing, patient was given "Data Collection Information Summary for Patients in Inpatient Rehabilitation Facilities with attached Privacy Act Statement Health Care Records" upon admission.    Patient information reviewed and entered into [facility] by Becky Kiley Solimine, PPS coordinator. Information including medical coding, function ability, and quality indicators will be reviewed and updated through discharge.   

## 2018-04-18 ENCOUNTER — Inpatient Hospital Stay (HOSPITAL_COMMUNITY): Payer: Self-pay | Admitting: Occupational Therapy

## 2018-04-18 ENCOUNTER — Inpatient Hospital Stay (HOSPITAL_COMMUNITY): Payer: Self-pay | Admitting: Speech Pathology

## 2018-04-18 ENCOUNTER — Inpatient Hospital Stay (HOSPITAL_COMMUNITY): Payer: Self-pay | Admitting: Physical Therapy

## 2018-04-18 DIAGNOSIS — E876 Hypokalemia: Secondary | ICD-10-CM

## 2018-04-18 DIAGNOSIS — R4587 Impulsiveness: Secondary | ICD-10-CM

## 2018-04-18 NOTE — Progress Notes (Signed)
Occupational Therapy Session Note  Patient Details  Name: Charles Patrick MRN: [id number] Date of Birth: [birth year]  Today's Date: 04/18/2018 OT Individual Time: 1006-1101 OT Individual Time Calculation (min): 55 min    Short Term Goals: Week 1:  OT Short Term Goal 1 (Week 1): STGs equal to LTGs set at supervision level overall.   Skilled Therapeutic Interventions/Progress Updates:    Pt up in the wheelchair at start of session attempting to remove alarm belt.  Gave pt redirection for purpose of belt for safety.  Had pt transfer to the EOB with min assist for bathing as he declined shower and usually sits on his bench at home and bathes with lateral leans for washing peri area.  Supervision for UB bathing and dressing sitting EOB.  He was able to complete LB bathing with supervision and lateral leans.  He then needed min assist for donning brief and pants with lateral leans as well.  Finished session with pt donning TED stocking and gripper sock on the left foot.  Pt left in bed to rest with call button and phone in reach and bed alarm in place.    Therapy Documentation Precautions:  Precautions Precautions: Fall Precaution Comments: blind right eye, prosthesis (R BKA) Restrictions Weight Bearing Restrictions: No  Pain: Pain Assessment Pain Score: 0-No pain ADL: See Care Tool Section for some details of ADL  Therapy/Group: Individual Therapy  Edwyn Inclan OTR/L 04/18/2018, 11:02 AM

## 2018-04-18 NOTE — Progress Notes (Signed)
PHYSICAL MEDICINE & REHABILITATION PROGRESS NOTE  Subjective/Complaints: Patient seen lying in bed this morning.  He states he slept well overnight.  He states he does not recollect how his first day of therapies went.  He was noted to be impulsive yesterday.  ROS: Denies CP, shortness of breath, nausea, vomiting, diarrhea.  Objective: Vital Signs: Blood pressure 102/72, pulse (!) 59, temperature 98.2 F (36.8 C), temperature source Oral, resp. rate 16, height 6\' 4"  (1.93 m), weight 80.1 kg, SpO2 95 %. No results found. Recent Labs    04/16/18 1706 04/17/18 0808  WBC 8.8 7.7  HGB 9.6* 10.2*  HCT 31.6* 33.0*  PLT 219 248   Recent Labs    04/16/18 0501 04/16/18 1706 04/17/18 0808  NA 139  --  138  K 3.0*  --  3.7  CL 103  --  106  CO2 23  --  22  GLUCOSE 107*  --  122*  BUN 16  --  13  CREATININE 0.97 0.89 0.99  CALCIUM 8.6*  --  8.7*    Physical Exam: BP 102/72 (BP Location: Left Arm)   Pulse (!) 59   Temp 98.2 F (36.8 C) (Oral)   Resp 16   Ht 6\' 4"  (1.93 m)   Wt 80.1 kg   SpO2 95%   BMI 21.50 kg/m  Constitutional: No distress . Vital signs reviewed. HENT: Normocephalic.  Atraumatic. Eyes: EOMI. No discharge. +Opaque right eye Cardiovascular: RRR.  No JVD. Respiratory: CTA bilaterally.  Normal effort. +[place] GI: BS +. Non-distended. Musc: No edema or tenderness in extremities. GI: Nondistended. Bowel sounds are normal.  Musculoskeletal: No edema or tenderness in extremities Right BKA  Neurological: He is alert and oriented.  Follows commands.  Fair awareness of deficits. Motor: Grossly 4+/5 throughout, unchanged Skin: Skin is warm and dry. Right BKA is well healed  Psychiatric: Impulsive with confusion  Assessment/Plan: 1. Functional deficits secondary to bilateral subdural hematomas which require 3+ hours per day of interdisciplinary therapy in a comprehensive inpatient rehab setting.  Physiatrist is providing close team supervision and 24  hour management of active medical problems listed below.  Physiatrist and rehab team continue to assess barriers to discharge/monitor patient progress toward functional and medical goals  Care Tool:  Bathing    Body parts bathed by patient: Right arm, Left arm, Chest, Abdomen, Left upper leg, Right lower leg, Left lower leg, Face   Body parts bathed by helper: Front perineal area, Buttocks Body parts n/a: Right lower leg   Bathing assist Assist Level: Moderate Assistance - Patient 50 - 74%     Upper Body Dressing/Undressing Upper body dressing   What is the patient wearing?: Hospital gown only    Upper body assist      Lower Body Dressing/Undressing Lower body dressing      What is the patient wearing?: Ace wrap/stump shrinker, Hospital gown only     Lower body assist Assist for lower body dressing: Minimal Assistance - Patient > 75%     Toileting Toileting    Toileting assist Assist for toileting: Minimal Assistance - Patient > 75%     Transfers Chair/bed transfer  Transfers assist     Chair/bed transfer assist level: Minimal Assistance - Patient > 75%     Locomotion Ambulation   Ambulation assist      Assist level: Minimal Assistance - Patient > 75% Assistive device: Walker-rolling Max distance: 15'   Walk 10 feet activity   Assist  Assist level: Moderate Assistance - Patient - 50 - 74% Assistive device: Walker-platform   Walk 50 feet activity   Assist    Assist level: Moderate Assistance - Patient - 50 - 74% Assistive device: Walker-rolling    Walk 150 feet activity   Assist Walk 150 feet activity did not occur: Safety/medical concerns         Walk 10 feet on uneven surface  activity   Assist Walk 10 feet on uneven surfaces activity did not occur: Safety/medical concerns         Wheelchair     Assist   Type of Wheelchair: Manual    Wheelchair assist level: Minimal Assistance - Patient > 75% Max wheelchair  distance: 150    Wheelchair 50 feet with 2 turns activity    Assist        Assist Level: Minimal Assistance - Patient > 75%   Wheelchair 150 feet activity     Assist     Assist Level: Minimal Assistance - Patient > 75%      Medical Problem List and Plan: 1.  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  Continue CIR 2.  DVT Prophylaxis/Anticoagulation: subcutaneous Lovenox initiated 04/15/2018. 3. Pain Management:  Neurontin 300 mg twice a day. 4. Mood:  Provide emotional support 5. Neuropsych: This patient is ?fully capable of making decisions on his own behalf.  Tele-sitter for safety 6. Skin/Wound Care:  Routine skin checks 7. Fluids/Electrolytes/Nutrition:  Routine ins and outs   Added potassium 2 days  BMP within acceptable range on 2/20, labs ordered for Monday 8. Seizure prophylaxis. Keppra 500 mg twice a day, plan to DC tomorrow. EEG negative 9. Acute onset atrial fibrillation. Amiodarone discontinued. Cardizem 30 mg every 6 hours. Cardiac rate controlled 10. Dyshagia: Dysphagia 1 nectar thick liquids. Follow-up speech therapy.   Advance diet as tolerated 10. 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 Patient does have a prosthesis. 11. History of aortic valve stenosis status post transcatheter aortic valve replacement 01/22/2017. Patient currently remains on Plavix as prior to admission. Aspirin on hold due to hematoma. 12. Hypothyroidism. Synthroid 13. Hyperlipidemia. Zocor 14. BPH. Flomax. Monitor bladder function. 15.  Chronic anemia  Hemoglobin 10.2 on 2/20  Continue to monitor   LOS: 2 days A FACE TO FACE EVALUATION WAS PERFORMED  Charles Patrick 04/18/2018, 9:14 AM

## 2018-04-18 NOTE — Progress Notes (Signed)
Physical Therapy Session Note  Patient Details  Name: Charles Patrick MRN: [id number] Date of Birth: 12-11-1924  Today's Date: 04/18/2018 PT Individual Time: 0920-1000 PT Individual Time Calculation (min): 40 min   Short Term Goals: Week 1:  PT Short Term Goal 1 (Week 1): Pt will propelk WC with supervision assist up to 156f  PT Short Term Goal 2 (Week 1): Pt will perform transfers with min assist and LRAD  PT Short Term Goal 3 (Week 1): Pt will ambulate 1026fwith min assist from PT.   Skilled Therapeutic Interventions/Progress Updates:   Pt received sitting in WC and agreeable to PT. Pt assisted pt to don R prosthesis with min-mod cues for proper alignment and use of smaller ply sock from 5 to 1 due to swelling in residual limb.   WC mobility instructed by PT x 18030fith supervision assist and min-mod cues for awareness of obstacles on the R.   Blocked practice sit<>stand [facility]-C[facility] from PT x 4 + 5. Reciprocal marches 2  10 BLE with BUE support on RW. Pt noted to have improved posture compared to previous treatment.   Gait training with RW x [id number] min-mod assist from PT for safety. Moderate cues for AD management to prevent flexed posture and to keep RW closer to COM.   Patient returned to room and [facility] and all needs met.         Therapy Documentation Precautions:  Precautions Precautions: Fall Precaution Comments: blind right eye, prosthesis (R BKA) Restrictions Weight Bearing Restrictions: No Vital Signs: Therapy Vitals BP: 102/72 Pain:  denies     Therapy/Group: Individual Therapy  [name]1/2020, 10:04 AM

## 2018-04-18 NOTE — Progress Notes (Signed)
Speech Language Pathology Daily Session Note  Patient Details  Name: Charles Patrick MRN: [id number] Date of Birth: [birth year]  Today's Date: 04/18/2018 SLP Individual Time: 1300-1400 SLP Individual Time Calculation (min): 60 min  Short Term Goals: Week 1: SLP Short Term Goal 1 (Week 1): Pt will consume dys 3 textures and nectar thick liquids with Mod I use of compensatory swallow strategies.  SLP Short Term Goal 1 - Progress (Week 1): Updated due to goal met SLP Short Term Goal 2 (Week 1): Pt will consume trials of thin liquids Mod I use of compensatory swallow strategies to demonstrate readiness for diet upgrade.  SLP Short Term Goal 2 - Progress (Week 1): Updated due to goal met SLP Short Term Goal 3 (Week 1): Pt will sustain attention to task for 5 minutes with Mod A cues.  SLP Short Term Goal 4 (Week 1): Pt will complete basic familiar problem solving tasks with Mod A cues.  SLP Short Term Goal 5 (Week 1): Pt will utilize compensatory memory aids to recall current information with Mod A cues.   Skilled Therapeutic Interventions:  Pt was seen for skilled ST targeting dysphagia and cognition goals.  Pt was resting upon arrival but awakened to voice and light touch and was agreeable to participating in therapy.  SLP facilitated the session with trials of thin liquids to continue working towards diet progression. Pt demonstrated mild, intermittent throat clears on water which appeared at bedside to be protective of his airway as his voice was clear after each attempt.  SLP also facilitated the session with trials of dys 3 textures as pt reported he's ready to try advanced solids.  Pt consumed graham crackers without difficulty despite being edentulous and mixed thin liquids and solids did not elicit coughing or increased throat clearing.  Would suggest that pt be advanced to dys 3 textures at this time given anticipated short length of stay per conversations with pt's primary OT.  I also feel that  pt would benefit from implementation of the water protocol to continue working towards liquids progression.  If pt exhibits minimal overt s/s of aspiration at bedside over the next several days, then I would feel comfortable advancing him to thin liquids at bedside per results of his most recent MBS.  Throughout therapy session, pt needed intermittent cues for redirection to task or topic at hand; however, I suspect that this is more a reflection of his baseline personality rather than indicative of acute cognitive dysfunction.  Pt demonstrated age appropriate recall of daily information with min question cues for clarification.  It would be helpful to have family come in to verify baseline level of cognitive functioning.  RN and nurse tech both deny significant safety concerns at this time.  Continue per current plan of care.    Pain Pain Assessment Pain Scale: 0-10 Pain Score: 0-No pain  Therapy/Group: Individual Therapy  Eagle Pitta, Selinda Orion 04/18/2018, 2:45 PM

## 2018-04-18 NOTE — Progress Notes (Signed)
[facility] Individual Statement of Services  Patient Name:  Charles Patrick  Date:  04/18/2018  Welcome [facility] the [facility].  Our goal is to provide you with an individualized program based on your diagnosis and situation, designed to meet your specific needs.  With this comprehensive rehabilitation program, you will be expected to participate in at least 3 hours of rehabilitation therapies Monday-Friday, with modified therapy programming on the weekends.  Your rehabilitation program will include the following services:  Physical Therapy (PT), Occupational Therapy (OT), Speech Therapy (ST), 24 hour per day rehabilitation nursing, Case Management (Social Worker), Rehabilitation Medicine, Nutrition Services and Pharmacy Services  Weekly team conferences will be held on Wednesdays to discuss your progress.  Your Social Worker will talk with you frequently to get your input and to update you on team discussions.  Team conferences with you and your family in attendance may also be held.  Expected length of stay:  7 to 14 days  Overall anticipated outcome:  Supervision  Depending on your progress and recovery, your program may change. Your Social Worker will coordinate services and will keep you informed of any changes. Your Social Worker's name and contact numbers are listed  below.  The following services may also be recommended but are not provided by the [facility]:   Driving Evaluations  Home Health Rehabiltiation Services  Outpatient Rehabilitation Services   Arrangements will be made to provide these services after discharge if needed.  Arrangements include referral to agencies that provide these services.  Your insurance has been verified to be:  Medicare Your primary doctor is:  Roxanne Mins, PA-C  Pertinent information will be shared with your doctor and your insurance company.  Social Worker:  Staci Acosta, LCSW  [phone] or [phone]  Information discussed with and copy given to patient by: Elvera Lennox, 04/18/2018, 2:53 PM

## 2018-04-18 NOTE — Progress Notes (Signed)
Occupational Therapy Session Note  Patient Details  Name: Charles Patrick MRN: [id number] Date of Birth: December 14, 1924  Today's Date: 04/18/2018 OT Individual Time: 4010-2725 OT Individual Time Calculation (min): 45 min    Short Term Goals: Week 1:  OT Short Term Goal 1 (Week 1): STGs equal to LTGs set at supervision level overall.   Skilled Therapeutic Interventions/Progress Updates:    Pt completed supine to sit EOB with use of the rails and supervision.  He then finished donning his prosthesis with setup as well.  Next he completed squat pivot transfer to the wheelchair with min guard assist.  He needed cueing to remember to remove the arm rest when attempting squat pivot.  He then rolled the wheelchair down to the ADL apartment with supervision where he completed tub bench, toilet, and bed transfers squat pivot.  He also completed one transfer into the bathroom with use of the RW with min guard assist as well.  Transfer onto the tub bench was not safe initially as pt did not remove foot rests, or arm of the chair and placed one LE over into the tub while sitting in the wheelchair and then proceeded to scoot with mod assist to the tub bench.  Educated pt on his mistakes and had him practice the correct way which was removing all of the above and then pivoting to the edge of the tub bench and bringing his LEs in.  For toilet transfer squat pivot he also demonstrated decreased safety, pulling the wheelchair too far in front of the 3:1 before completing transfer.  Once on the seat, he then had to move the wheelchair out of the way to sit.  Educated pt on safer way by placing the wheelchair further back as to not have to move it after squat pivot transfer.  He completed bed transfer at the same level min guard and then to supine with supervision.  He was able to sit back up from supine with supervision before transferring back to the wheelchair squat pivot again.  Finished session with wheelchair mobility  back to the room and transfer back to the bed.  Bed alarm in place and call button and phone in reach.    Therapy Documentation Precautions:  Precautions Precautions: Fall Precaution Comments: blind right eye, prosthesis (R BKA) Restrictions Weight Bearing Restrictions: No  Pain: Pain Assessment Pain Scale: 0-10 Pain Score: 0-No pain   Therapy/Group: Individual Therapy  Marketia Stallsmith OTR/L 04/18/2018, 3:58 PM

## 2018-04-19 ENCOUNTER — Inpatient Hospital Stay (HOSPITAL_COMMUNITY): Payer: Self-pay | Admitting: Speech Pathology

## 2018-04-19 DIAGNOSIS — I4891 Unspecified atrial fibrillation: Secondary | ICD-10-CM

## 2018-04-19 DIAGNOSIS — I10 Essential (primary) hypertension: Secondary | ICD-10-CM

## 2018-04-19 DIAGNOSIS — E7849 Other hyperlipidemia: Secondary | ICD-10-CM

## 2018-04-19 NOTE — Progress Notes (Signed)
Charles Patrick is a 85 y.o. male [birth year] [id number]  Subjective: Wants to ask me questions that "my other doctors do't have good answers", but cannot recall the questions while eating. Denies pain or problems. Supervised attendants in room for meal and tele-sitter cam also in corner.  Objective: Vital signs in last 24 hours: Temp:  [98.1 F (36.7 C)-98.5 F (36.9 C)] 98.5 F (36.9 C) (02/22 0521) Pulse Rate:  [69-78] 69 (02/22 0521) Resp:  [16-20] 16 (02/22 0521) BP: (95-108)/(69-85) 95/69 (02/22 0521) SpO2:  [94 %-96 %] 94 % (02/22 0521) Weight:  [78.3 kg] 78.3 kg (02/22 0500) Weight change:  Last BM Date: 04/19/18  Intake/Output from previous day: 02/21 0701 - 02/22 0700 In: 1080 [P.O.:1080] Out: 1150 [Urine:1150]  Physical Exam General: No apparent distress   Opaque R eye Lungs: Normal effort. Lungs clear to auscultation, no crackles or wheezes. Cardiovascular: Regular rate and rhythm, no edema   Lab Results: BMET    Component Value Date/Time   NA 138 04/17/2018 0808   K 3.7 04/17/2018 0808   CL 106 04/17/2018 0808   CO2 22 04/17/2018 0808   GLUCOSE 122 (H) 04/17/2018 0808   BUN 13 04/17/2018 0808   CREATININE 0.99 04/17/2018 0808   CALCIUM 8.7 (L) 04/17/2018 0808   GFRNONAA >60 04/17/2018 0808   GFRAA >60 04/17/2018 0808   CBC    Component Value Date/Time   WBC 7.7 04/17/2018 0808   RBC 4.76 04/17/2018 0808   HGB 10.2 (L) 04/17/2018 0808   HCT 33.0 (L) 04/17/2018 0808   PLT 248 04/17/2018 0808   MCV 69.3 (L) 04/17/2018 0808   MCH 21.4 (L) 04/17/2018 0808   MCHC 30.9 04/17/2018 0808   RDW 15.5 04/17/2018 0808   LYMPHSABS 1.7 04/17/2018 0808   MONOABS 0.8 04/17/2018 0808   EOSABS 0.2 04/17/2018 0808   BASOSABS 0.0 04/17/2018 0808   CBG's (last 3):  No results for input(s): GLUCAP in the last 72 hours. LFT's Lab Results  Component Value Date   ALT 17 04/17/2018   AST 16 04/17/2018   ALKPHOS 64 04/17/2018   BILITOT 0.8 04/17/2018     Studies/Results: No results found.  Medications:  I have reviewed the patient's current medications. Scheduled Medications: . clopidogrel  75 mg Oral Q breakfast  . diltiazem  30 mg Oral Q6H  . enoxaparin (LOVENOX) injection  40 mg Subcutaneous Q24H  . gabapentin  300 mg Oral BID  . levETIRAcetam  500 mg Oral BID  . levothyroxine  50 mcg Oral Q0600  . multivitamin with minerals  1 tablet Oral Daily  . simvastatin  5 mg Oral q1800  . tamsulosin  0.4 mg Oral QPC supper  . ascorbic acid  500 mg Oral Daily   PRN Medications: acetaminophen, sorbitol  Assessment/Plan: Principal Problem:   Bilateral subdural hematomas (HCC) Active Problems:   HTN (hypertension)   HLD (hyperlipidemia)   Hypothyroidism   Atrial fibrillation (HCC)   Seizure prophylaxis   Anemia, chronic disease   Impulsiveness   1. Debility and functional deficits following TBI from [facility] - NSurg rec conservative and supportive care - Continue CIR as rx'd 2. Sz prophylaxis with Keppra, now completed course - monitor 3. Acute AF - Amio stopped and on Dilt q6h with rate control. No full anticoag given #1 4. 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 Hypothyroid - continue replacement 6. Dyslipidemia - statin  Length of stay, days: 3  Vikki Ports  Edsel Petrin, MD 04/19/2018, 12:07 PM

## 2018-04-20 ENCOUNTER — Inpatient Hospital Stay (HOSPITAL_COMMUNITY): Payer: Self-pay | Admitting: Physical Therapy

## 2018-04-20 ENCOUNTER — Inpatient Hospital Stay (HOSPITAL_COMMUNITY): Payer: Self-pay

## 2018-04-20 DIAGNOSIS — E039 Hypothyroidism, unspecified: Secondary | ICD-10-CM

## 2018-04-20 NOTE — Progress Notes (Signed)
Charles Patrick is a 85 y.o. male [birth year] [id number]  Subjective: Very pleasant and glad to be OOB. Completing work with PT and glad to be more mobile. Denies pain or problem  Objective: Vital signs in last 24 hours: Temp:  [97.8 F (36.6 C)-98.3 F (36.8 C)] 98.3 F (36.8 C) (02/23 0427) Pulse Rate:  [65-75] 71 (02/23 0427) Resp:  [14-17] 14 (02/23 0427) BP: (101-111)/(66-79) 103/79 (02/23 0427) SpO2:  [95 %-99 %] 95 % (02/23 0427) Weight:  [76.5 kg] 76.5 kg (02/23 0427) Weight change: -1.8 kg Last BM Date: 04/19/18  Intake/Output from previous day: 02/22 0701 - 02/23 0700 In: 680 [P.O.:680] Out: 1475 [Urine:1475]  Physical Exam General: No apparent distress   Opaque R eye Lungs: Normal effort. Lungs clear to auscultation, no crackles or wheezes. Cardiovascular: Regular rate and rhythm, no edema   Lab Results: BMET    Component Value Date/Time   NA 138 04/17/2018 0808   K 3.7 04/17/2018 0808   CL 106 04/17/2018 0808   CO2 22 04/17/2018 0808   GLUCOSE 122 (H) 04/17/2018 0808   BUN 13 04/17/2018 0808   CREATININE 0.99 04/17/2018 0808   CALCIUM 8.7 (L) 04/17/2018 0808   GFRNONAA >60 04/17/2018 0808   GFRAA >60 04/17/2018 0808   CBC    Component Value Date/Time   WBC 7.7 04/17/2018 0808   RBC 4.76 04/17/2018 0808   HGB 10.2 (L) 04/17/2018 0808   HCT 33.0 (L) 04/17/2018 0808   PLT 248 04/17/2018 0808   MCV 69.3 (L) 04/17/2018 0808   MCH 21.4 (L) 04/17/2018 0808   MCHC 30.9 04/17/2018 0808   RDW 15.5 04/17/2018 0808   LYMPHSABS 1.7 04/17/2018 0808   MONOABS 0.8 04/17/2018 0808   EOSABS 0.2 04/17/2018 0808   BASOSABS 0.0 04/17/2018 0808   CBG's (last 3):  No results for input(s): GLUCAP in the last 72 hours. LFT's Lab Results  Component Value Date   ALT 17 04/17/2018   AST 16 04/17/2018   ALKPHOS 64 04/17/2018   BILITOT 0.8 04/17/2018    Studies/Results: No results found.  Medications:  I have reviewed the patient's current  medications. Scheduled Medications: . clopidogrel  75 mg Oral Q breakfast  . diltiazem  30 mg Oral Q6H  . enoxaparin (LOVENOX) injection  40 mg Subcutaneous Q24H  . gabapentin  300 mg Oral BID  . levETIRAcetam  500 mg Oral BID  . levothyroxine  50 mcg Oral Q0600  . multivitamin with minerals  1 tablet Oral Daily  . simvastatin  5 mg Oral q1800  . tamsulosin  0.4 mg Oral QPC supper  . ascorbic acid  500 mg Oral Daily   PRN Medications: acetaminophen, sorbitol  Assessment/Plan: Principal Problem:   Bilateral subdural hematomas (HCC) Active Problems:   HTN (hypertension)   HLD (hyperlipidemia)   Hypothyroidism   Atrial fibrillation (HCC)   Seizure prophylaxis   Anemia, chronic disease   Impulsiveness   1. Debility and functional deficits following TBI from B [facility] - NSurg rec conservative and supportive care - Continue CIR as rx'd 2. Sz prophylaxis with Keppra following #1- Intention to DC same per Allena Katz 2/21 note? Will defer to primary team to review with neuro this week (as neuro note 2/14 indicates continue Keppra and sz precautions) - will monitor 3. Acute AF - Amio stopped and on Dilt q6h with rate control. No full anticoag given #1 4. 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 Hypothyroid -  continue replacement 6. Dyslipidemia - statin  Length of stay, days: 4  Alene Bergerson A. Felicity Coyer, MD 04/20/2018, 9:29 AM

## 2018-04-20 NOTE — Progress Notes (Signed)
Occupational Therapy Session Note  Patient Details  Name: Charles Patrick MRN: [id number] Date of Birth: 01/21/1925  Today's Date: 04/20/2018 OT Individual Time: 0730-0830 Session 2: 1445-1530 OT Individual Time Calculation (min): 60 min Session 2: 45 min  OT Missed Time: 30 min d/t pt having family present and requesting to spend time with them     Short Term Goals: Week 1:  OT Short Term Goal 1 (Week 1): STGs equal to LTGs set at supervision level overall.    Skilled Therapeutic Interventions/Progress Updates:    Pt received sleeping, easily arousable with verbal cues. No reports of pain. Pt completed bed mobility, transitioning to EOB with mod instructional cues to bring BLE to EOB first and then min A to power up into sitting. Pt sat EOB and ate breakfast, requiring intermittent cueing and full supervision for bite size management and alternating liquids. No overt s/s of aspiration during eating. Pt very verbose, although friendly, requiring redirection to breakfast. Pt able to complete peri hygiene seated with lateral leans with cues for direction. Pt donned pants in similar fashion with min A. Pt able to don R BKA sitting EOB with set up assist. Pt demo-ed good awareness with BKA management, asking for appropriate sock. Pt completed SPT to w/c with CGA. Pt practiced transfer x2, with CGA each time. Pt left sitting up in w/c with all needs met, chair alarm belt set and MD present.   Session 2: Initially greeted pt at scheduled time. Pt politely declining participation for first 30 min to spend more time with family visiting. Once OT returned to room, pt ready to go, no c/o pain. Pt completed w/c propulsion down to therapy gym with (S). Pt transferred to therapy mat with very close (S). Pt completed alternating standing level and seated B UE strengthening exercises with a focus on integrating core stabilization and dynamic standing balance. Pt completed additional core stability activity seated  with cueing required for core form and muscle activation. Pt returned to room and was left sitting up in w/c with chair alarm belt fastened and all needs met.   Therapy Documentation Precautions:  Precautions Precautions: Fall Precaution Comments: blind right eye, prosthesis (R BKA) Restrictions Weight Bearing Restrictions: No Pain: Pain Assessment Pain Scale: 0-10 Pain Score: 0-No pain   Therapy/Group: Individual Therapy  Curtis Sites 04/20/2018, 8:33 AM

## 2018-04-20 NOTE — Progress Notes (Signed)
Physical Therapy Session Note  Patient Details  Name: Charles Patrick MRN: [id number] Date of Birth: 1924-06-16  Today's Date: 04/20/2018 PT Individual Time: 1100-1156 PT Individual Time Calculation (min): 56 min   Short Term Goals: Week 1:  PT Short Term Goal 1 (Week 1): Pt will propelk WC with supervision assist up to 115ft  PT Short Term Goal 2 (Week 1): Pt will perform transfers with min assist and LRAD  PT Short Term Goal 3 (Week 1): Pt will ambulate 160ft with min assist from PT.   Skilled Therapeutic Interventions/Progress Updates:  Pt was seen bedside in the am. Pt propelled w/c 150 feet x 2 with B UEs and L LE, S and occasional verbal cues. Pt performed multiple sit to stand transfers with rolling walker and c/g with verbal cues. Pt ambulated 75 feet x 2 with rolling walker and c/g. Pt ascended/descended 8 stairs with B rails and min A with verbal cues. Performed step taps and alternating step taps 3 sets x 10 reps each for LE strengthening. Pt returned to room and left sitting up in w/c with chair alarm on and call bell within reach.   Therapy Documentation Precautions:  Precautions Precautions: Fall Precaution Comments: blind right eye, prosthesis (R BKA) Restrictions Weight Bearing Restrictions: No General:   Pain: Pain Assessment Pain Scale: 0-10 Pain Score: 0-No pain  Therapy/Group: Individual Therapy  Rayford Halsted 04/20/2018, 12:01 PM

## 2018-04-21 ENCOUNTER — Inpatient Hospital Stay (HOSPITAL_COMMUNITY): Payer: Self-pay | Admitting: Occupational Therapy

## 2018-04-21 ENCOUNTER — Encounter (HOSPITAL_COMMUNITY): Payer: Self-pay

## 2018-04-21 ENCOUNTER — Inpatient Hospital Stay (HOSPITAL_COMMUNITY): Payer: Self-pay | Admitting: Physical Therapy

## 2018-04-21 ENCOUNTER — Inpatient Hospital Stay (HOSPITAL_COMMUNITY): Payer: Self-pay | Admitting: Speech Pathology

## 2018-04-21 LAB — BASIC METABOLIC PANEL
ANION GAP: 6 (ref 5–15)
BUN: 8 mg/dL (ref 8–23)
CO2: 28 mmol/L (ref 22–32)
Calcium: 8.6 mg/dL — ABNORMAL LOW (ref 8.9–10.3)
Chloride: 104 mmol/L (ref 98–111)
Creatinine, Ser: 0.94 mg/dL (ref 0.61–1.24)
GFR calc non Af Amer: >60 mL/min (ref >60)
Glucose, Bld: 96 mg/dL (ref 70–99)
POTASSIUM: 4.2 mmol/L (ref 3.5–5.1)
Sodium: 138 mmol/L (ref 135–145)

## 2018-04-21 NOTE — Plan of Care (Signed)
  Problem: RH BOWEL ELIMINATION Goal: RH STG MANAGE BOWEL WITH ASSISTANCE Description STG Manage Bowel with Assistance. Min  Outcome: Progressing   Problem: RH BLADDER ELIMINATION Goal: RH STG MANAGE BLADDER WITH ASSISTANCE Description STG Manage Bladder With Assistance. Min  Outcome: Progressing   Problem: RH SAFETY Goal: RH STG ADHERE TO SAFETY PRECAUTIONS W/ASSISTANCE/DEVICE Description STG Adhere to Safety Precautions With Assistance/Device. Min  Outcome: Progressing   

## 2018-04-21 NOTE — Progress Notes (Signed)
Remains on Tele- monitoring no acute behavior or non behaviors issues or c/o. Tolerating po liquids well, voiding QS, Resting , call bell within easy reach, bed alarm on, Monitor and assisted

## 2018-04-21 NOTE — Plan of Care (Signed)
  Problem: RH BOWEL ELIMINATION Goal: RH STG MANAGE BOWEL WITH ASSISTANCE Description STG Manage Bowel with Assistance. Min  04/21/2018 1519 by Kalman Shan, LPN Outcome: Progressing 04/21/2018 1511 by Kalman Shan, LPN Outcome: Progressing   Problem: RH BLADDER ELIMINATION Goal: RH STG MANAGE BLADDER WITH ASSISTANCE Description STG Manage Bladder With Assistance. Min  04/21/2018 1519 by Kalman Shan, LPN Outcome: Progressing 04/21/2018 1511 by Kalman Shan, LPN Outcome: Progressing   Problem: RH SAFETY Goal: RH STG ADHERE TO SAFETY PRECAUTIONS W/ASSISTANCE/DEVICE Description STG Adhere to Safety Precautions With Assistance/Device. Min  04/21/2018 1519 by Kalman Shan, LPN Outcome: Progressing 04/21/2018 1511 by Kalman Shan, LPN Outcome: Progressing

## 2018-04-21 NOTE — Discharge Summary (Signed)
Physician Discharge Summary  Patient ID: Charles Patrick MRN: [id number] DOB/AGE: 83-18-1926 83 y.o.  Admit date: 04/11/2018 Discharge date: 04/21/2018  Admission Diagnoses: Status epilepticus  Discharge Diagnoses:  Active Problems:   SDH (subdural hematoma) (HCC)   Malnutrition of moderate degree   Atrial fibrillation (HCC)   Neuropathic pain   Seizure prophylaxis   Dysphagia   S/P BKA (below knee amputation) unilateral, right (HCC)   Benign prostatic hyperplasia   Discharged Condition: good  Hospital Course: 85 year old man with known history of seizure disorder who presented in status epilepticus.  Prompt seizure control.  Remained intubated for confusion.  Eventually awoke and was extubated.  Developed rapid atrial fibrillation for which she was started on amiodarone infusion.  Once he is cleared for swallowing amiodarone was stopped and he was controlled with oral diltiazem.  He was cleared for inpatient rehabilitation.  Consults: rehabilitation medicine  Significant Diagnostic Studies: CT consistent with prior strokes  Treatments: IV hydration, respiratory therapy: Mechanical ventilation and anticonvulsant therapy  Discharge Exam: Blood pressure 108/77, pulse (!) 59, temperature 97.9 F (36.6 C), temperature source Oral, resp. rate (!) 21, height 6\' 2"  (1.88 m), weight 77.5 kg, SpO2 97 %. General appearance: alert, cooperative and appears stated age Head: Normocephalic, without obvious abnormality, atraumatic Resp: clear to auscultation bilaterally Cardio: regular rate and rhythm, S1, S2 normal, no murmur, click, rub or gallop Extremities: extremities normal, atraumatic, no cyanosis or edema Neurologic: Alert and oriented X 3, normal strength and tone. Normal symmetric reflexes. Normal coordination and gait  Disposition:    Allergies as of 04/16/2018   No Known Allergies     Medication List    ASK your doctor about these medications   acetaminophen 500 MG  tablet Commonly known as:  TYLENOL Take 2 tablets (1,000 mg total) by mouth every 8 (eight) hours.   amoxicillin 500 MG tablet Commonly known as:  AMOXIL Take 4 tablets by mouth one hour prior to dental appointments and stomach or bladder procedures   ascorbic acid 500 MG tablet Commonly known as:  VITAMIN C Take 1 tablet (500 mg total) by mouth daily.   aspirin EC 81 MG tablet Take 81 mg by mouth daily.   clopidogrel 75 MG tablet Commonly known as:  PLAVIX Take 1 tablet (75 mg total) by mouth daily with breakfast.   docusate sodium 100 MG capsule Commonly known as:  COLACE Take 1 capsule (100 mg total) by mouth 2 (two) times daily.   enoxaparin 40 MG/0.4ML injection Commonly known as:  LOVENOX Inject 0.4 mLs (40 mg total) into the skin daily for 7 days.   gabapentin 300 MG capsule Commonly known as:  NEURONTIN Take 1 capsule (300 mg total) by mouth 2 (two) times daily.   levothyroxine 50 MCG tablet Commonly known as:  SYNTHROID, LEVOTHROID Take 50 mcg by mouth daily before breakfast.   multivitamin capsule Take 1 capsule by mouth daily.   oxyCODONE 5 MG immediate release tablet Commonly known as:  Oxy IR/ROXICODONE Take 1-2 tablets (5-10 mg total) by mouth every 6 (six) hours as needed for moderate pain or severe pain (pain score 4-6).   simvastatin 5 MG tablet Commonly known as:  ZOCOR Take 5 mg by mouth daily at 6 PM.   tamsulosin 0.4 MG Caps capsule Commonly known as:  FLOMAX Take 0.4 mg by mouth daily after supper.        SignedLynnell Catalan 04/21/2018, 4:09 PM

## 2018-04-21 NOTE — Progress Notes (Signed)
Social Work Assessment and Plan  Patient Details  Name: Charles Patrick MRN: [id number] Date of Birth: 1924/09/12  Today's Date: 04/17/2018  Problem List:  Patient Active Problem List   Diagnosis Date Noted  . Hypokalemia   . Impulsiveness   . Anemia, chronic disease   . PAF (paroxysmal atrial fibrillation) ([place])   . Atrial fibrillation ([place]) 04/16/2018  . Bilateral subdural hematomas ([place]) 04/16/2018  . Neuropathic pain   . Seizure prophylaxis   . Dysphagia   . S/P BKA (below knee amputation) unilateral, right ([place])   . Benign prostatic hyperplasia   . Malnutrition of moderate degree 04/12/2018  . SDH (subdural hematoma) ([place]) 04/11/2018  . Below knee amputation status, right   . Anemia of chronic disease   . Anemia   . 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 HTN (hypertension) 10/08/2016  . HLD (hyperlipidemia) 10/08/2016  . Hypothyroidism 10/08/2016   Past Medical History:  Past Medical History:  Diagnosis Date  . Anemia   . Aortic valve stenosis   . Arthropathy   . BPH associated with nocturia   . Carotid artery disease ([place])   . Colon polyp   . Dilated aortic root ([place])   . Elevated PSA   . Erectile dysfunction   . Gastric paresis   . Hypercholesterolemia   . Hyperglycemia   . Hypertension    Patient denies  . Hypothyroid   . Iron deficiency   . Pulmonary hypertension ([place])   . 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 Seizure disorder ([place])   . Thrombocytopenia ([place])   . Thyroid nodule   . Tricuspid regurgitation   . Vitamin D deficiency    Past Surgical History:  Past Surgical History:  Procedure Laterality Date  . AMPUTATION Right 08/19/2017   Procedure: AMPUTATION BELOW KNEE;  Surgeon: Altamese Triangle, MD;  Location: [place];  Service: Orthopedics;  Laterality: Right;  . EXTERNAL FIXATION LEG Right  08/14/2017   Procedure: EXTERNAL FIXATION LEG;  Surgeon: Wylene Simmer, MD;  Location: [place];  Service: Orthopedics;  Laterality: Right;  . I&D EXTREMITY Right 08/16/2017   Procedure: IRRIGATION AND DEBRIDEMENT EXTREMITY;  Surgeon: Altamese Overton, MD;  Location: [place];  Service: Orthopedics;  Laterality: Right;  . INCISION AND DRAINAGE Right 08/14/2017   Procedure: IRRIGATION AND DEBRIDMENT RIGHT ANKLE, APPLICATION WOUND VAC;  Surgeon: Wylene Simmer, MD;  Location: [facility];  Service: Orthopedics;  Laterality: Right;  . MULTIPLE EXTRACTIONS WITH ALVEOLOPLASTY N/A 10/17/2016   Procedure: Extraction of tooth #'s 4, 21,22,27,28,29 with alveoloplasty;  Surgeon: Lenn Cal, DDS;  Location: [facility];  Service: Oral Surgery;  Laterality: N/A;  . NO PAST SURGERIES    . RIGHT/LEFT HEART CATH AND CORONARY ANGIOGRAPHY N/A 01/11/2017   Procedure: RIGHT/LEFT HEART CATH AND CORONARY ANGIOGRAPHY;  Surgeon: Sherren Mocha, MD;  Location: [facility];  Service: Cardiovascular;  Laterality: N/A;  . TEE WITHOUT CARDIOVERSION N/A 01/22/2017   Procedure: TRANSESOPHAGEAL ECHOCARDIOGRAM (TEE);  Surgeon: Sherren Mocha, MD;  Location: [facility];  Service: Open Heart Surgery;  Laterality: N/A;  . TRANSCATHETER AORTIC VALVE REPLACEMENT, TRANSFEMORAL N/A 01/22/2017   Procedure: TRANSCATHETER AORTIC VALVE REPLACEMENT, TRANSFEMORAL using Edwards 29 Sapien 3 Aortic Valve;  Surgeon: Sherren Mocha, MD;  Location: [place] [facility];  Service: Open Heart Surgery;  Laterality: N/A;   Social History:  reports that he has never smoked. He has  never used smokeless tobacco. He reports that he does not drink alcohol or use drugs.  Family / Support Systems Marital Status: Married Patient Roles: Spouse, Parent, Other (Comment)(grandparent; veteran) Spouse/Significant Other: Reace Breshears - wife - [phone] Children: Deondra Wigger - son - [phone] (1st contact); Sharone Almond - son - [phone] Other Supports: Shanon Brow - son - lives  with pt/wife, but is a long distance truck Tour manager: wife and sons Ability/Limitations of Caregiver: Captain drives long distance truck; Ludwig Clarks is Radiation protection practitioner; wife is 57 years old Building control surveyor Availability: 24/7 Family Dynamics: supportive family  Social History Preferred language: English Religion: Seventh Day Adventist Read: Yes Write: Yes Employment Status: Retired Date Retired/Disabled/Unemployed: 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  Wife and son would be next of kin for decision making.   Abuse/Neglect Abuse/Neglect Assessment Can Be Completed: Yes Physical Abuse: Denies Verbal Abuse: Denies Sexual Abuse: Denies Exploitation of patient/patient's resources: Denies Self-Neglect: Denies  Emotional Status Pt's affect, behavior and adjustment status: Pt and son both report that pt is doing okay emotionally and seems to be adjusting to current situation.  He is impulsive at times. Recent Psychosocial Issues: none reported Psychiatric History: none reported Substance Abuse History: none reported  Patient / Family Perceptions, Expectations & Goals Pt/Family understanding of illness & functional limitations: Pt told CSW that he had a stroke.  Son reports feeling the family has been informed of pt's condition. Premorbid pt/family roles/activities: Pt likes to watch TV and still likes to work on [employer]. Anticipated changes in roles/activities/participation: Pt hopes to resume activities as he is able. Pt/family expectations/goals: Pt's son would like for pt to be able to resume being physically active.  Community [employer] Agencies: None Premorbid Home Care/DME Agencies: Other (Comment)(Pt with R BKA in June 2019 and required SNF care.  He has  a walker, w/c, 3-in-1 commode, and shower seat.) Transportation available at discharge: family Resource referrals recommended: Neuropsychology, Support group (specify)  Discharge Planning Living Arrangements: Spouse/significant other, Children(Rashid - adult son) Support Systems: Spouse/significant other, Children Type of Residence: Private residence Insurance Resources: Medicare(VA services) Financial Resources: Radio broadcast assistant Screen Referred: No Money Management: Patient Does the patient have any problems obtaining your medications?: No(pt receives medications from the [place]) Home Management: family can assist with this Patient/Family Preliminary Plans: Pt plans to return to his home with his wife or sons to provide 24/7 supervision.  Wife can do this, but not physical care. Social Work Anticipated Follow Up Needs: HH/OP, Support Group Expected length of stay: 7 to 14 days  Clinical Impression CSW met with pt and then spoke with pt's son, Ludwig Clarks, via telephone to introduce self and role of CSW, as well as to complete assessment.  Pt [facility] SN[facility] after his amputation, so they understand rehabilitation, but he is glad that pt was able to stay at the hospital this time and didn't need to go to SNF or [place].  Ludwig Clarks stated that his mother can provide 24/7 supervision, but not hands on physical care as she is going through cancer treatments herself.  Other family members can be in and out, as needed, too and when Ahlijah is not working, he will assist.  Ludwig Clarks will come in for family education.  Pt may have all needed  DME already at home.  CSW will continue to follow and assist as needed.  Joni Norrod, Silvestre Mesi 04/19/2018, 12:01 AM

## 2018-04-21 NOTE — Progress Notes (Signed)
Speech Language Pathology Daily Session Note  Patient Details  Name: Charles Patrick MRN: [id number] Date of Birth: 11-03-1924  Today's Date: 04/21/2018 SLP Individual Time: 1045-1130 SLP Individual Time Calculation (min): 45 min  Short Term Goals: Week 1: SLP Short Term Goal 1 (Week 1): Pt will consume dys 3 textures and nectar thick liquids with Mod I use of compensatory swallow strategies.  SLP Short Term Goal 1 - Progress (Week 1): Updated due to goal met SLP Short Term Goal 2 (Week 1): Pt will consume trials of thin liquids Mod I use of compensatory swallow strategies to demonstrate readiness for diet upgrade.  SLP Short Term Goal 2 - Progress (Week 1): Updated due to goal met SLP Short Term Goal 3 (Week 1): Pt will sustain attention to task for 5 minutes with Mod A cues.  SLP Short Term Goal 4 (Week 1): Pt will complete basic familiar problem solving tasks with Mod A cues.  SLP Short Term Goal 5 (Week 1): Pt will utilize compensatory memory aids to recall current information with Mod A cues.   Skilled Therapeutic Interventions:  Skilled treatment session focused on dysphagia and cognition goals. SLP facilitated session by providing skilled observation of pt consuming thin liquids via cup sips. Pt with increased effeceincy of swallow over previous session as evidenced by appearance of swift swallow initiation and one swallow per bolus. No subtle throat clears present. MBS reviewed with no aspiration observed during study. Therefore, recommend pt be upgraded to thin liquids, no straw, medicine whole with puree. SLP further facilitiated session by providing Mod I for redirection to task d/t pt's perseveration on his own level of improvement. Friend was present during session and confirms this was pt's baseline. Pt left upright in wheelchair, lap alarm on and all needs within reach. Education provided to pt's nurse on upgrade to thin liquids. Continue per current plan of care.      Pain Pain  Assessment Pain Scale: 0-10 Pain Score: 0-No pain  Therapy/Group: Individual Therapy  Trenae Brunke 04/21/2018, 12:23 PM

## 2018-04-21 NOTE — Progress Notes (Signed)
Physical Therapy Session Note  Patient Details  Name: Charles Patrick MRN: [id number] Date of Birth: 08-Dec-1924  Today's Date: 04/21/2018 PT Individual Time: 0803-0900 PT Individual Time Calculation (min): 57 min   Short Term Goals: Week 1:  PT Short Term Goal 1 (Week 1): Pt will propelk WC with supervision assist up to 174ft  PT Short Term Goal 2 (Week 1): Pt will perform transfers with min assist and LRAD  PT Short Term Goal 3 (Week 1): Pt will ambulate 149ft with min assist from PT.   Skilled Therapeutic Interventions/Progress Updates:  Pt presented in bed completing breakfast and agreeable to therapy. Pt performed bed mobility to EOB with use of bed features with CGA. Pt performed lateral leans to doff brief. Pt then used BUE to lift buttocks off bed while PTA donned brief. Pt then donned pants with minA from PTA to pull up over hips. Performed stand pivot transfers to/from bed x 3 as pt wanted to demonstrate ability to perform transfers. Pt propelled to rehab gym using x 4 extremities supervision level. Pt ambulated with RW 323ft CGA with cues for staying within RW and to decrease speed for safety. Upon return to mat pt performed STS x 5 for BLE strengthening. Pt also performed toe taps and alternating toe taps 2 x 10 for balance and general strengthening. Pt performed ascending/descending stairs with CGA x 8 6in and x 16 3in steps with step through pattern on both. After brief rest pt ambulated back to room CGA with minA last 33ft due to fatigue. Pt then handed off to OT for next session.      Therapy Documentation Precautions:  Precautions Precautions: Fall Precaution Comments: blind right eye, prosthesis (R BKA) Restrictions Weight Bearing Restrictions: No General:   Vital Signs:   Pain: Pain Assessment Pain Scale: 0-10 Pain Score: 0-No pain    Therapy/Group: Individual Therapy  Charese Abundis  Moshe Wenger, PTA  04/21/2018, 12:30 PM

## 2018-04-21 NOTE — Progress Notes (Signed)
Occupational Therapy Session Note  Patient Details  Name: Charles Patrick MRN: [id number] Date of Birth: 1924/09/30  Today's Date: 04/21/2018 OT Individual Time: 1415-1449 OT Individual Time Calculation (min): 34 min    Short Term Goals: Week 1:  OT Short Term Goal 1 (Week 1): STGs equal to LTGs set at supervision level overall.   Skilled Therapeutic Interventions/Progress Updates:    Pt completed functional wheelchair mobility with supervision from his room down to the door outside of the gym.  Therapist then pushed him down to the dayroom for use of the UE ergonometer in order to increase BUE strengthening and endurance.  He was able to complete 2 sets of 7.5 mins each with resistance on level 8 and Random Program setting.  RPMs maintained from 25-35 with each set.  He was given 1 rest break for approximately 2 mins before completing the second set in reverse.  Finished session with transfer back to the room via wheelchair and therapist assist.  Pt transferred squat pivot back to the bed where he removed his RLE prosthesis and liners and transitioned to supine.  Call button and phone in reach with bed alarm in place.    Therapy Documentation Precautions:  Precautions Precautions: Fall Precaution Comments: blind right eye, prosthesis (R BKA) Restrictions Weight Bearing Restrictions: No  Pain: Pain Assessment Pain Scale: 0-10 Pain Score: 0-No pain ADL: See Care Tool Section for some details of ADL Therapy/Group: Individual Therapy  Tarnesha Ulloa OTR/L 04/21/2018, 3:48 PM

## 2018-04-21 NOTE — Progress Notes (Signed)
[place] PHYSICAL MEDICINE & REHABILITATION PROGRESS NOTE  Subjective/Complaints: Patient seen sitting up in bed this morning.  He states he slept well overnight.  He states he had a good weekend and enjoyed his therapies.  ROS: Denies CP, shortness of breath, nausea, vomiting, diarrhea.  Objective: Vital Signs: Blood pressure 90/65, pulse 73, temperature 98.2 F (36.8 C), temperature source Oral, resp. rate 15, height 6\' 4"  (1.93 m), weight 76 kg, SpO2 96 %. No results found. No results for input(s): WBC, HGB, HCT, PLT in the last 72 hours. No results for input(s): NA, K, CL, CO2, GLUCOSE, BUN, CREATININE, CALCIUM in the last 72 hours.  Physical Exam: BP 90/65 (BP Location: Left Arm)   Pulse 73   Temp 98.2 F (36.8 C) (Oral)   Resp 15   Ht 6\' 4"  (1.93 m)   Wt 76 kg   SpO2 96%   BMI 20.39 kg/m  Constitutional: No distress . Vital signs reviewed. HENT: Normocephalic.  Atraumatic. Eyes: EOMI. No discharge. +Opaque right eye Cardiovascular: RRR.  No JVD. Respiratory: CTA bilaterally.  Normal effort. +[place] GI: BS +. Non-distended. Musc: No edema or tenderness in extremities. GI: Nondistended. Bowel sounds are normal.  Musculoskeletal: No edema or tenderness in extremities Right BKA  Neurological: He is alert. Follows commands.  Motor: Grossly 4+/5 throughout, stable Skin: Skin is warm and dry. Right BKA is well healed  Psychiatric: Impulsive.  Assessment/Plan: 1. Functional deficits secondary to bilateral subdural hematomas which require 3+ hours per day of interdisciplinary therapy in a comprehensive inpatient rehab setting.  Physiatrist is providing close team supervision and 24 hour management of active medical problems listed below.  Physiatrist and rehab team continue to assess barriers to discharge/monitor patient progress toward functional and medical goals  Care Tool:  Bathing    Body parts bathed by patient: Right arm, Left arm, Chest, Abdomen, Left upper  leg, Left lower leg, Face, Buttocks, Front perineal area, Right upper leg(sitting with lateral leans side to side)   Body parts bathed by helper: Front perineal area, Buttocks Body parts n/a: Right lower leg   Bathing assist Assist Level: Supervision/Verbal cueing     Upper Body Dressing/Undressing Upper body dressing   What is the patient wearing?: Pull over shirt    Upper body assist Assist Level: Set up assist    Lower Body Dressing/Undressing Lower body dressing    Lower body dressing activity did not occur: (lateral leans side to side in sitting) What is the patient wearing?: Incontinence brief     Lower body assist Assist for lower body dressing: Total Assistance - Patient < 25%     Toileting Toileting    Toileting assist Assist for toileting: Moderate Assistance - Patient 50 - 74%     Transfers Chair/bed transfer  Transfers assist     Chair/bed transfer assist level: Contact Guard/Touching assist     Locomotion Ambulation   Ambulation assist      Assist level: Contact Guard/Touching assist Assistive device: Walker-rolling Max distance: 75   Walk 10 feet activity   Assist     Assist level: Contact Guard/Touching assist Assistive device: Walker-rolling   Walk 50 feet activity   Assist    Assist level: Contact Guard/Touching assist Assistive device: Walker-rolling    Walk 150 feet activity   Assist Walk 150 feet activity did not occur: Safety/medical concerns         Walk 10 feet on uneven surface  activity   Assist Walk 10 feet on  uneven surfaces activity did not occur: Safety/medical concerns         Wheelchair     Assist   Type of Wheelchair: Manual    Wheelchair assist level: Supervision/Verbal cueing Max wheelchair distance: 150    Wheelchair 50 feet with 2 turns activity    Assist        Assist Level: Supervision/Verbal cueing   Wheelchair 150 feet activity     Assist     Assist Level:  Supervision/Verbal cueing      Medical Problem List and Plan: 1.  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  Continue CIR  Weekend notes reviewed 2.  DVT Prophylaxis/Anticoagulation: subcutaneous Lovenox initiated 04/15/2018. 3. Pain Management:  Neurontin 300 mg twice a day. 4. Mood:  Provide emotional support 5. Neuropsych: This patient is ?fully capable of making decisions on his own behalf.  Continue tele-sitter for safety 6. Skin/Wound Care:  Routine skin checks 7. Fluids/Electrolytes/Nutrition:  Routine ins and outs   Added potassium 2 days  BMP within acceptable range on 2/20, labs pending 8. Seizure prophylaxis. Keppra 500 mg twice a day, await surgery Recs for d/cing. EEG negative 9. Acute onset atrial fibrillation. Amiodarone discontinued. Cardizem 30 mg every 6 hours. Cardiac rate controlled 10. Dyshagia: Follow-up speech therapy.   Advance to D3 nectars  Continue to advance diet as tolerated 10. 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 Patient does have a prosthesis. 11. History of aortic valve stenosis status post transcatheter aortic valve replacement 01/22/2017. Patient currently remains on Plavix as prior to admission. Aspirin on hold due to hematoma. 12. Hypothyroidism. Synthroid 13. Hyperlipidemia. Zocor 14. BPH. Flomax. Monitor bladder function. 15.  Chronic anemia  Hemoglobin 10.2 on 2/20  Continue to monitor   LOS: 5 days A FACE TO FACE EVALUATION WAS PERFORMED  Ankit Karis Juba 04/21/2018, 9:05 AM

## 2018-04-21 NOTE — Progress Notes (Signed)
Occupational Therapy Session Note  Patient Details  Name: Charles Patrick MRN: [id number] Date of Birth: 1925-02-06  Today's Date: 04/21/2018 OT Individual Time: 0905-1000 OT Individual Time Calculation (min): 55 min    Short Term Goals: Week 1:  OT Short Term Goal 1 (Week 1): STGs equal to LTGs set at supervision level overall.   Skilled Therapeutic Interventions/Progress Updates:    Pt completed bathing and dressing sitting EOB with lateral leans side to side with supervision.  He was able to complete all UB selfcare with setup as well as washing his lower body except for the left foot and peri area with supervision as well.  Pt declined washing these other parts as he stated he had a good bath last night.  Finished session with call button and phone in reach and pt resting sitting in the wheelchair.  Chair alarm in place and call button and phone in reach.    Therapy Documentation Precautions:  Precautions Precautions: Fall Precaution Comments: blind right eye, prosthesis (R BKA) Restrictions Weight Bearing Restrictions: No   Pain: Pain Assessment Pain Score: 0-No pain ADL: See Care Tool Section for some details of ADL  Therapy/Group: Individual Therapy  Felise Georgia OTR/L 04/21/2018, 10:00 AM

## 2018-04-22 ENCOUNTER — Inpatient Hospital Stay (HOSPITAL_COMMUNITY): Payer: Self-pay

## 2018-04-22 ENCOUNTER — Inpatient Hospital Stay (HOSPITAL_COMMUNITY): Payer: Self-pay | Admitting: Occupational Therapy

## 2018-04-22 ENCOUNTER — Inpatient Hospital Stay (HOSPITAL_COMMUNITY): Payer: Self-pay | Admitting: Speech Pathology

## 2018-04-22 ENCOUNTER — Inpatient Hospital Stay (HOSPITAL_COMMUNITY): Payer: Self-pay | Admitting: Physical Therapy

## 2018-04-22 NOTE — Plan of Care (Signed)
  Problem: RH BOWEL ELIMINATION Goal: RH STG MANAGE BOWEL WITH ASSISTANCE Description STG Manage Bowel with Assistance. Min  Outcome: Progressing   Problem: RH BLADDER ELIMINATION Goal: RH STG MANAGE BLADDER WITH ASSISTANCE Description STG Manage Bladder With Assistance. Min  Outcome: Progressing   Problem: RH SAFETY Goal: RH STG ADHERE TO SAFETY PRECAUTIONS W/ASSISTANCE/DEVICE Description STG Adhere to Safety Precautions With Assistance/Device. Min  Outcome: Progressing

## 2018-04-22 NOTE — Progress Notes (Signed)
Speech Language Pathology Daily Session Note  Patient Details  Name: Charles Patrick MRN: [id number] Date of Birth: [birth year]  Today's Date: 04/22/2018 SLP Individual Time: 1000-1058 SLP Individual Time Calculation (min): 58 min  Short Term Goals: Week 1: SLP Short Term Goal 1 (Week 1): Pt will consume dys 3 textures and nectar thick liquids with Mod I use of compensatory swallow strategies.  SLP Short Term Goal 1 - Progress (Week 1): Updated due to goal met SLP Short Term Goal 2 (Week 1): Pt will consume trials of thin liquids Mod I use of compensatory swallow strategies to demonstrate readiness for diet upgrade.  SLP Short Term Goal 2 - Progress (Week 1): Updated due to goal met SLP Short Term Goal 3 (Week 1): Pt will sustain attention to task for 5 minutes with Mod A cues.  SLP Short Term Goal 4 (Week 1): Pt will complete basic familiar problem solving tasks with Mod A cues.  SLP Short Term Goal 5 (Week 1): Pt will utilize compensatory memory aids to recall current information with Mod A cues.   Skilled Therapeutic Interventions: Pt was seen for skilled ST session targeting dysphagia and cognition. Pt traveled via wheelchair to day room, where pt consumed dysphagia 3 texture solid and thin liquids with Mod I assist for use of swallow precautions. Pt denied any difficulties consuming current diet since last ST visit. SLP facilitated remainder of session with a semi-complex functional problem solving task, during which pt required min verbal cues for problem solving and supervision verbal cues for error detection to complete money calculations. When SLP engaged pt in conversation regarding discharge plans, pt required min verbal cues and probing to identify tasks he may need help with upon his return home. Pt was taken back to room and left sitting in wheelchair with chair alarm set and all needs met. Recommend continue per current plan of care.      Pain Pain Assessment Pain Score: 0-No  pain  Therapy/Group: Individual Therapy   Jettie Booze, Student SLP   Jettie Booze 04/22/2018, 12:40 PM

## 2018-04-22 NOTE — Progress Notes (Signed)
Charles Patrick PHYSICAL MEDICINE & REHABILITATION PROGRESS NOTE  Subjective/Complaints: Patient seen laying in bed this morning.  States he slept well overnight.  He denies complaints.  ROS: Denies CP, shortness of breath, nausea, vomiting, diarrhea.  Objective: Vital Signs: Blood pressure 94/69, pulse 71, temperature 98.2 F (36.8 C), temperature source Oral, resp. rate 14, height 6\' 4"  (1.93 m), weight 73.5 kg, SpO2 98 %. No results found. No results for input(s): WBC, HGB, HCT, PLT in the last 72 hours. Recent Labs    04/21/18 0807  NA 138  K 4.2  CL 104  CO2 28  GLUCOSE 96  BUN 8  CREATININE 0.94  CALCIUM 8.6*    Physical Exam: BP 94/69 (BP Location: Left Arm)   Pulse 71   Temp 98.2 F (36.8 C) (Oral)   Resp 14   Ht 6\' 4"  (1.93 m)   Wt 73.5 kg   SpO2 98%   BMI 19.72 kg/m  Constitutional: No distress . Vital signs reviewed. HENT: Normocephalic.  Atraumatic. Eyes: EOMI. No discharge. +Opaque right eye Cardiovascular: RRR. No JVD. Respiratory: CTA bilaterally. Normal effort. +[place] GI: BS +. Non-distended. Musc: No edema or tenderness in extremities. GI: Nondistended. Bowel sounds are normal.  Musculoskeletal: No edema or tenderness in extremities Right BKA  Neurological: He is alert and oriented, except date of month. Follows commands.  Motor: Grossly 4+/5 throughout, stable Skin: Skin is warm and dry. Right BKA healed Psychiatric: Impulsive.  Assessment/Plan: 1. Functional deficits secondary to bilateral subdural hematomas which require 3+ hours per day of interdisciplinary therapy in a comprehensive inpatient rehab setting.  Physiatrist is providing close team supervision and 24 hour management of active medical problems listed below.  Physiatrist and rehab team continue to assess barriers to discharge/monitor patient progress toward functional and medical goals  Care Tool:  Bathing    Body parts bathed by patient: Right arm, Left arm, Chest, Abdomen,  Right upper leg, Left upper leg, Face, Buttocks   Body parts bathed by helper: Front perineal area, Buttocks Body parts n/a: Left lower leg, Right lower leg, Front perineal area, Buttocks(Pt did not want to wash this am)   Bathing assist Assist Level: Supervision/Verbal cueing     Upper Body Dressing/Undressing Upper body dressing   What is the patient wearing?: Pull over shirt    Upper body assist Assist Level: Supervision/Verbal cueing    Lower Body Dressing/Undressing Lower body dressing    Lower body dressing activity did not occur: (lateral leans side to side in sitting) What is the patient wearing?: Pants     Lower body assist Assist for lower body dressing: Supervision/Verbal cueing     Toileting Toileting    Toileting assist Assist for toileting: Moderate Assistance - Patient 50 - 74%     Transfers Chair/bed transfer  Transfers assist     Chair/bed transfer assist level: Contact Guard/Touching assist(squat pivot transfer)     Locomotion Ambulation   Ambulation assist      Assist level: Contact Guard/Touching assist Assistive device: Walker-rolling Max distance: 75   Walk 10 feet activity   Assist     Assist level: Contact Guard/Touching assist Assistive device: Walker-rolling   Walk 50 feet activity   Assist    Assist level: Contact Guard/Touching assist Assistive device: Walker-rolling    Walk 150 feet activity   Assist Walk 150 feet activity did not occur: Safety/medical concerns         Walk 10 feet on uneven surface  activity  Assist Walk 10 feet on uneven surfaces activity did not occur: Safety/medical concerns         Wheelchair     Assist   Type of Wheelchair: Manual    Wheelchair assist level: Supervision/Verbal cueing Max wheelchair distance: 150    Wheelchair 50 feet with 2 turns activity    Assist        Assist Level: Supervision/Verbal cueing   Wheelchair 150 feet activity      Assist     Assist Level: Supervision/Verbal cueing      Medical Problem List and Plan: 1.  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  Continue CIR 2.  DVT Prophylaxis/Anticoagulation: subcutaneous Lovenox initiated 04/15/2018. 3. Pain Management:  Neurontin 300 mg twice a day. 4. Mood:  Provide emotional support 5. Neuropsych: This patient is ?fully capable of making decisions on his own behalf.  Continue tele-sitter for safety 6. Skin/Wound Care:  Routine skin checks 7. Fluids/Electrolytes/Nutrition:  Routine ins and outs   BMP within acceptable range on 2/25 8. Seizure prophylaxis. Keppra 500 mg twice a day, continue to await surgery Recs for d/cing.  EEG negative 9. Acute onset atrial fibrillation. Amiodarone discontinued. Cardizem 30 mg every 6 hours. Cardiac rate controlled 10. Dyshagia: Follow-up speech therapy.   Advance to D3 nectars  Continue to advance diet as tolerated 10. 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 Patient does have a prosthesis. 11. History of aortic valve stenosis status post transcatheter aortic valve replacement 01/22/2017. Patient currently remains on Plavix as prior to admission. Aspirin on hold due to hematoma. 12. Hypothyroidism. Synthroid 13. Hyperlipidemia. Zocor 14. BPH. Flomax. Monitor bladder function. 15.  Chronic anemia  Hemoglobin 10.2 on 2/20  Continue to monitor   LOS: 6 days A FACE TO FACE EVALUATION WAS PERFORMED  Keisha Amer Karis Juba 04/22/2018, 8:56 AM

## 2018-04-22 NOTE — Progress Notes (Signed)
Resting throughout shift eyes closed .appears asleep w/o distress or .c/o , respiration even and steady, Continue Tele-monitoring, call bell within reach.

## 2018-04-22 NOTE — Progress Notes (Signed)
Occupational Therapy Session Note  Patient Details  Name: Charles Patrick MRN: [id number] Date of Birth: 07-09-1924  Today's Date: 04/22/2018 OT Individual Time: 1105-1202 OT Individual Time Calculation (min): 57 min    Short Term Goals: Week 1:  OT Short Term Goal 1 (Week 1): STGs equal to LTGs set at supervision level overall.   Skilled Therapeutic Interventions/Progress Updates:    Pt completed functional mobility from the room down to the therapy gym with min guard assist using the RW for support.  Once in the gym had him work on standing balance and endurance while using construction PVC pipe task.  He was able to maintain standing balance for intervals of 6 and 10 mins with close supervision while alternating UEs for task as well as using them together at the same time.  Min instructional cueing needed to complete puzzles following diagram given.  Finished session with return to the room and pt left sitting EOB with bed alarm in place in anticipation of lunch coming.  Pt wanted to sit EOB secondary to softness of the bed compared to the wheelchair.  Call button and phone in reach.    Therapy Documentation Precautions:  Precautions Precautions: Fall Precaution Comments: blind right eye, prosthesis (R BKA) Restrictions Weight Bearing Restrictions: No   Pain: Pain Assessment Pain Score: 0-No pain ADL: See Care Tool Section for some details of ADL  Therapy/Group: Individual Therapy  Joani Cosma OTR/L 04/22/2018, 12:50 PM

## 2018-04-22 NOTE — Progress Notes (Signed)
Occupational Therapy Session Note  Patient Details  Name: KAVAUGHN FAUCETT MRN: [id number] Date of Birth: May 27, 1924  Today's Date: 04/22/2018 OT Individual Time: 0830-0900 OT Individual Time Calculation (min): 30 min    Short Term Goals: Week 1:  OT Short Term Goal 1 (Week 1): STGs equal to LTGs set at supervision level overall.   Skilled Therapeutic Interventions/Progress Updates:    Pt seen for 30 min makeup time. Session focused on toileting tasks. Pt sitting EOB upon arrival. Pt completed stand pivot transfer from EOB to [facility] with (S). Pt voided bowel/bladder with increased time. Pt completed peri hygiene with lateral lean/half squat with L unilateral support on BSC. Pt transferred back to EOB where he donned pants with min A sit <> stand. Set up provided to don R prosthesis. Pt left sitting EOB with bed alarm set and all needs met.   Therapy Documentation Precautions:  Precautions Precautions: Fall Precaution Comments: blind right eye, prosthesis (R BKA) Restrictions Weight Bearing Restrictions: No Pain:  No pain reported during session.    Therapy/Group: Individual Therapy  Curtis Sites 04/22/2018, 7:20 AM

## 2018-04-22 NOTE — Progress Notes (Signed)
Physical Therapy Session Note  Patient Details  Name: Charles Patrick MRN: [id number] Date of Birth: 10/20/1924  Today's Date: 04/22/2018 PT Individual Time: 1420-1533 PT Individual Time Calculation (min): 73 min   Short Term Goals: Week 1:  PT Short Term Goal 1 (Week 1): Pt will propelk WC with supervision assist up to 19f  PT Short Term Goal 2 (Week 1): Pt will perform transfers with min assist and LRAD  PT Short Term Goal 3 (Week 1): Pt will ambulate 1033fwith min assist from PT.   Skilled Therapeutic Interventions/Progress Updates: Pt presented in bed agreeable to therapy. Pt performed bed mobility supervision level and donned prosthetic mod I. Pt performed squat pivot to w/c supervision level. Pt propelled to day room and performed squat pivot to NuStep. Performed NuStep L5 x 10 min for cardiovascular endurance and global strengthening. Pt able to maintain avg 50-60 SPM during NuStep. Returned to w/c in same manner as prior and propelled to rehab gym. Pt participated in seated BUE therex for strengthening. Performed chest press, shoulder flexion with 3lb dowel 2 x 15, scapular  retraction with level 2 resistance band on 3lb dowel 2 x 15 and chair push ups 2 x 10.  Pt also participated in ascending/descending stairs for LE strengthening 6in x 8 and B rails. Pt returned to day room and participated in UBE L4 x 6 min (3 forward/3 backwards) for continued endurance. After brief rest, pt ambulated with RW x 17525fith CGA and verbal cues for erect posture and improved proximity to RW. Pt propelled remaining distance to room and remained in w/c at end of session with call bell within reach and all needs met.      Therapy Documentation Precautions:  Precautions Precautions: Fall Precaution Comments: blind right eye, prosthesis (R BKA) Restrictions Weight Bearing Restrictions: No General:   Vital Signs: Therapy Vitals Temp: 98.3 F (36.8 C) Pulse Rate: 77 Resp: 19 BP: (!)  90/56 Patient Position (if appropriate): Lying Oxygen Therapy SpO2: 99 % O2 Device: Room Air Pain: Pain Assessment Pain Score: 0-No pain  Therapy/Group: Individual Therapy  Charles Patrick  Charles Patrick, PTA  04/22/2018, 3:50 PM

## 2018-04-23 ENCOUNTER — Inpatient Hospital Stay (HOSPITAL_COMMUNITY): Payer: Self-pay | Admitting: Speech Pathology

## 2018-04-23 ENCOUNTER — Inpatient Hospital Stay (HOSPITAL_COMMUNITY): Payer: Self-pay

## 2018-04-23 ENCOUNTER — Inpatient Hospital Stay (HOSPITAL_COMMUNITY): Payer: Self-pay | Admitting: Occupational Therapy

## 2018-04-23 LAB — CREATININE, SERUM
Creatinine, Ser: 1.02 mg/dL (ref 0.61–1.24)
GFR calc Af Amer: >60 mL/min (ref >60)
GFR calc non Af Amer: >60 mL/min (ref >60)

## 2018-04-23 NOTE — Progress Notes (Signed)
[place] PHYSICAL MEDICINE & REHABILITATION PROGRESS NOTE  Subjective/Complaints: Patient seen sitting up at the edge of his bed about eat breakfast.  He states he slept well overnight.  He states he feels great.  ROS: Denies CP, shortness of breath, nausea, vomiting, diarrhea.  Objective: Vital Signs: Blood pressure 95/72, pulse 66, temperature 98.2 F (36.8 C), temperature source Oral, resp. rate 15, height 6\' 4"  (1.93 m), weight 73.5 kg, SpO2 98 %. No results found. No results for input(s): WBC, HGB, HCT, PLT in the last 72 hours. Recent Labs    04/21/18 0807 04/23/18 0522  NA 138  --   K 4.2  --   CL 104  --   CO2 28  --   GLUCOSE 96  --   BUN 8  --   CREATININE 0.94 1.02  CALCIUM 8.6*  --     Physical Exam: BP 95/72 (BP Location: Left Arm)   Pulse 66   Temp 98.2 F (36.8 C) (Oral)   Resp 15   Ht 6\' 4"  (1.93 m)   Wt 73.5 kg   SpO2 98%   BMI 19.72 kg/m  Constitutional: No distress . Vital signs reviewed. HENT: Normocephalic.  Atraumatic. Eyes: EOMI. No discharge. +Opaque right eye Cardiovascular: RRR.  No JVD. Respiratory: CTA bilaterally.  Normal effort. +[place] GI: BS +. Non-distended. Musc: No edema or tenderness in extremities. GI: Nondistended. Bowel sounds are normal.  Musculoskeletal: No edema or tenderness in extremities Right BKA  Neurological: He is alert and oriented, except date of month. Follows commands.  Motor: Grossly 4+-5/5 throughout Skin: Skin is warm and dry. Right BKA healed Psychiatric: Impulsive.  Assessment/Plan: 1. Functional deficits secondary to bilateral subdural hematomas which require 3+ hours per day of interdisciplinary therapy in a comprehensive inpatient rehab setting.  Physiatrist is providing close team supervision and 24 hour management of active medical problems listed below.  Physiatrist and rehab team continue to assess barriers to discharge/monitor patient progress toward functional and medical goals  Care  Tool:  Bathing    Body parts bathed by patient: Right arm, Left arm, Chest, Abdomen, Right upper leg, Left upper leg, Face, Buttocks, Front perineal area   Body parts bathed by helper: Front perineal area, Buttocks Body parts n/a: Right lower leg, Left lower leg   Bathing assist Assist Level: Supervision/Verbal cueing     Upper Body Dressing/Undressing Upper body dressing   What is the patient wearing?: Pull over shirt    Upper body assist Assist Level: Set up assist    Lower Body Dressing/Undressing Lower body dressing    Lower body dressing activity did not occur: (lateral leans side to side in sitting) What is the patient wearing?: Pants     Lower body assist Assist for lower body dressing: Supervision/Verbal cueing(Lateral leans in sitting)     Toileting Toileting    Toileting assist Assist for toileting: Moderate Assistance - Patient 50 - 74%     Transfers Chair/bed transfer  Transfers assist     Chair/bed transfer assist level: Supervision/Verbal cueing     Locomotion Ambulation   Ambulation assist      Assist level: Contact Guard/Touching assist Assistive device: Walker-rolling Max distance: 176ft   Walk 10 feet activity   Assist     Assist level: Contact Guard/Touching assist Assistive device: Walker-rolling   Walk 50 feet activity   Assist    Assist level: Contact Guard/Touching assist Assistive device: Walker-rolling    Walk 150 feet activity   Assist  Walk 150 feet activity did not occur: Safety/medical concerns  Assist level: Contact Guard/Touching assist Assistive device: Walker-rolling    Walk 10 feet on uneven surface  activity   Assist Walk 10 feet on uneven surfaces activity did not occur: Safety/medical concerns         Wheelchair     Assist   Type of Wheelchair: Manual    Wheelchair assist level: Supervision/Verbal cueing Max wheelchair distance: 150    Wheelchair 50 feet with 2 turns  activity    Assist        Assist Level: Supervision/Verbal cueing   Wheelchair 150 feet activity     Assist     Assist Level: Supervision/Verbal cueing      Medical Problem List and Plan: 1.  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  Continue CIR 2.  DVT Prophylaxis/Anticoagulation: subcutaneous Lovenox initiated 04/15/2018. 3. Pain Management:  Neurontin 300 mg twice a day. 4. Mood:  Provide emotional support 5. Neuropsych: This patient is ?fully capable of making decisions on his own behalf.  Continue tele-sitter for safety 6. Skin/Wound Care:  Routine skin checks 7. Fluids/Electrolytes/Nutrition:  Routine ins and outs   BMP within acceptable range on 2/25, creatinine within normal limits on 2/26 8. Seizure prophylaxis. Keppra 500 mg twice a day, continue to await surgery Recs for d/cing on 2/26.  EEG negative 9. Acute onset atrial fibrillation. Amiodarone discontinued. Cardizem 30 mg every 6 hours. Cardiac rate controlled 10. Dyshagia: Follow-up speech therapy.   Advance to D3 thins  Continue to advance diet as tolerated 10. 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 Patient does have a prosthesis. 11. History of aortic valve stenosis status post transcatheter aortic valve replacement 01/22/2017. Patient currently remains on Plavix as prior to admission. Aspirin on hold due to hematoma. 12. Hypothyroidism. Synthroid 13. Hyperlipidemia. Zocor 14. BPH. Flomax. Monitor bladder function. 15.  Chronic anemia  Hemoglobin 10.2 on 2/20  Labs ordered for tomorrow  Continue to monitor   LOS: 7 days A FACE TO FACE EVALUATION WAS PERFORMED  Faiz Weber Karis Juba 04/23/2018, 8:59 AM

## 2018-04-23 NOTE — Progress Notes (Signed)
Occupational Therapy Discharge Summary  Patient Details  Name: Charles Patrick MRN: [id number] Date of Birth: 16-Aug-1924   Patient has met 13 of 13 long term goals due to improved activity tolerance, improved balance and ability to compensate for deficits.  Patient to discharge at overall Supervision level.  Patient's care partner is independent to provide the necessary physical and cognitive assistance at discharge.    Reasons goals not met: NA  Recommendation:  Patient will benefit from ongoing skilled OT services in home health setting to continue to advance functional skills in the area of BADL.  Charles Patrick is making steady progress with OT to a supervision level for most selfcare tasks and functional transfers.  Recommend continued HHOT to further evaluate performance of selfcare tasks in familiar environment.    Equipment: No equipment provided  Reasons for discharge: treatment goals met and discharge from hospital  Patient/family agrees with progress made and goals achieved: Yes  OT Discharge Precautions/Restrictions  Precautions Precautions: Fall Precaution Comments: blind right eye, prosthesis (R BKA) Restrictions Weight Bearing Restrictions: No   Vital Signs Therapy Vitals Temp: 98.3 F (36.8 C) Pulse Rate: 73 Resp: 18 BP: 96/70 Patient Position (if appropriate): Sitting Oxygen Therapy SpO2: 97 % O2 Device: Room Air Pain Pain Assessment Pain Scale: 0-10 Pain Score: 0-No pain ADL ADL Eating: Independent Where Assessed-Eating: Wheelchair Grooming: Modified independent Where Assessed-Grooming: Wheelchair Upper Body Bathing: Setup Where Assessed-Upper Body Bathing: Edge of bed Lower Body Bathing: Supervision/safety Where Assessed-Lower Body Bathing: Edge of bed Upper Body Dressing: Supervision/safety Where Assessed-Upper Body Dressing: Edge of bed Lower Body Dressing: Setup Where Assessed-Lower Body Dressing: Edge of bed Toileting:  Supervision/safety Where Assessed-Toileting: Bedside Commode Toilet Transfer: Close supervision Toilet Transfer Method: Engineer, water: Engineer, technical sales Transfer: Close supervison Clinical cytogeneticist Method: Administrator, arts: Nurse, mental health Transfer: Distant supervision Vision Baseline Vision/History: Wears glasses;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) Wears Glasses: At all times Patient Visual Report: No change from baseline Vision Assessment?: Yes Eye Alignment: Within Functional Limits Ocular Range of Motion: Within Functional Limits Alignment/Gaze Preference: Within Defined Limits Tracking/Visual Pursuits: Able to track stimulus in all quads without difficulty Perception  Perception: Within Functional Limits Praxis Praxis: Intact Cognition Overall Cognitive Status: Within Functional Limits for tasks assessed Arousal/Alertness: Awake/alert Attention: Sustained Sustained Attention: Appears intact Memory: Appears intact Awareness: Appears intact Problem Solving: Appears intact Safety/Judgment: Appears intact Sensation Sensation Light Touch: Appears Intact Hot/Cold: Appears Intact Proprioception: Appears Intact Stereognosis: Appears Intact Coordination Gross Motor Movements are Fluid and Coordinated: Yes Fine Motor Movements are Fluid and Coordinated: Yes Finger Nose Finger Test: [facility] Bil  Motor  Motor Motor: Within Functional Limits Mobility  Bed Mobility Rolling Right: Supervision/verbal cueing Rolling Left: Supervision/Verbal cueing Supine to Sit: Supervision/Verbal cueing Sit to Supine: Supervision/Verbal cueing Transfers Sit to Stand: Supervision/Verbal cueing  Trunk/Postural Assessment  Cervical Assessment Cervical Assessment: Exceptions to WFL(cervical protraction present) Thoracic Assessment Thoracic Assessment: Exceptions to WFL(slight thoracic flexion) Lumbar Assessment Lumbar  Assessment: Exceptions to WFL(posterior pelvic tilt in sitting) Postural Control Postural Control: Within Functional Limits  Balance Balance Balance Assessed: Yes Static Sitting Balance Static Sitting - Balance Support: Feet supported Static Sitting - Level of Assistance: 7: Independent Dynamic Sitting Balance Dynamic Sitting - Balance Support: Feet supported;During functional activity Dynamic Sitting - Level of Assistance: 7: Independent Static Standing Balance Static Standing - Balance Support: During functional activity Static Standing - Level of Assistance: 5: Stand by assistance Dynamic Standing Balance Dynamic Standing -  Balance Support: During functional activity Dynamic Standing - Level of Assistance: 5: Stand by assistance Extremity/Trunk Assessment RUE Assessment RUE Assessment: Within Functional Limits LUE Assessment LUE Assessment: Within Functional Limits   Charles Patrick OTR/L 04/23/2018, 10:18 PM

## 2018-04-23 NOTE — Progress Notes (Signed)
Speech Language Pathology Daily Session Note  Patient Details  Name: CASEY FYE MRN: [id number] Date of Birth: 1924-04-15  Today's Date: 04/23/2018 SLP Individual Time: 1307-1400 SLP Individual Time Calculation (min): 53 min  Short Term Goals: Week 1: SLP Short Term Goal 1 (Week 1): Pt will consume dys 3 textures and nectar thick liquids with Mod I use of compensatory swallow strategies.  SLP Short Term Goal 1 - Progress (Week 1): Updated due to goal met SLP Short Term Goal 2 (Week 1): Pt will consume trials of thin liquids Mod I use of compensatory swallow strategies to demonstrate readiness for diet upgrade.  SLP Short Term Goal 2 - Progress (Week 1): Updated due to goal met SLP Short Term Goal 3 (Week 1): Pt will sustain attention to task for 5 minutes with Mod A cues.  SLP Short Term Goal 4 (Week 1): Pt will complete basic familiar problem solving tasks with Mod A cues.  SLP Short Term Goal 5 (Week 1): Pt will utilize compensatory memory aids to recall current information with Mod A cues.   Skilled Therapeutic Interventions:  Pt was seen for skilled ST targeting cognitive goals.  SLP facilitated the session with medication management tasks to address goals for recall of new information.  Pt had difficulty identifying the function of medications when named; however, during functional conversations with therapist regarding his previous medication regimen, he could describe general function of medications independent from medication names.  Pt could also identify changes to medication regimen (ie.  "So, I'm only on three new medications") with supervision cues.  SLP provided skilled education regarding taking medications as prescribed and talking with his doctor prior to stopping any medications.  Pt verbalized understanding and was in agreement with plan of care.  Pt was left in bed with bed alarm set and call bell within reach.    Pain Pain Assessment Pain Scale: 0-10 Pain Score: 0-No  pain  Therapy/Group: Individual Therapy  Gentri Guardado, Selinda Orion 04/23/2018, 2:28 PM

## 2018-04-23 NOTE — Progress Notes (Signed)
Occupational Therapy Session Note  Patient Details  Name: Charles Patrick MRN: [id number] Date of Birth: [birth year]  Today's Date: 04/23/2018 OT Individual Time: 0806-0900 OT Individual Time Calculation (min): 54 min    Short Term Goals: Week 1:  OT Short Term Goal 1 (Week 1): STGs equal to LTGs set at supervision level overall.   Skilled Therapeutic Interventions/Progress Updates:    Pt completed bathing and dressing from sitting position in the bed with supervision overall.  He was able to wash his buttocks and peri area with supervision and lateral leans.  He was able to donn his brief and pants with supervision with the same technique as well.  He donned his left sock and shoe as well as the prosthesis with supervision to finish dressing.  Supervision with min instructional cueing for wheelchair setup to complete squat pivot transfer to the wheelchair.  He finished session with oral hygiene at the sink at modified independent level.  Pt left in the wheelchair at bedside with call button and phone in reach and safety belt in place.   Therapy Documentation Precautions:  Precautions Precautions: Fall Precaution Comments: blind right eye, prosthesis (R BKA) Restrictions Weight Bearing Restrictions: No  Pain: Pain Assessment Pain Score: 0-No pain ADL: See Care Tool Section for some details of ADL  Therapy/Group: Individual Therapy  Baruch Lewers OTR/L 04/23/2018, 9:00 AM

## 2018-04-23 NOTE — Progress Notes (Addendum)
Occupational Therapy Session Note  Patient Details  Name: Charles Patrick MRN: [id number] Date of Birth: Dec 11, 1924  Today's Date: 04/23/2018 OT Individual Time: 7673-4193 OT Individual Time Calculation (min): 34 min    Short Term Goals: Week 1:  OT Short Term Goal 1 (Week 1): STGs equal to LTGs set at supervision level overall.   Skilled Therapeutic Interventions/Progress Updates:    Pt completed supine to sit EOB with supervision and then donned his RLE prosthesis and his button up shirt with supervision.  He also donned his left shoe with setup assist as well.  Next had pt complete squat pivot transfer to the wheelchair with supervision.  Therapist took him down to the therapy day room for work on UE and LE strengthening and endurance with use of the Nustep.  He was able to complete 10 mins on level 8 resistance with average number of steps at 48.  Transfers on and off of the Nustep were completed at squat pivot level with supervision.  Returned to room at end of session via wheelchair and therapist assist.  Pt opted to stay up in the wheelchair.  Call button and phone in reach with safety alarm belt in place.   Therapy Documentation Precautions:  Precautions Precautions: Fall Precaution Comments: blind right eye, prosthesis (R BKA) Restrictions Weight Bearing Restrictions: No  Pain: Pain Assessment Pain Scale: 0-10 Pain Score: 0-No pain ADL: See Care Tool Section for some details of ADL  Therapy/Group: Individual Therapy  Josephine Wooldridge OTR/L 04/23/2018, 4:10 PM

## 2018-04-23 NOTE — Progress Notes (Signed)
Physical Therapy Session Note  Patient Details  Name: Charles Patrick MRN: [id number] Date of Birth: [birth year]  Today's Date: 04/23/2018 PT Individual Time: 0918-1003 PT Individual Time Calculation (min): 45 min   Short Term Goals: Week 1:  PT Short Term Goal 1 (Week 1): Pt will propelk WC with supervision assist up to 151ft  PT Short Term Goal 2 (Week 1): Pt will perform transfers with min assist and LRAD  PT Short Term Goal 3 (Week 1): Pt will ambulate 115ft with min assist from PT.   Skilled Therapeutic Interventions/Progress Updates:    Patient in w/c in room moving about in chair.  Propelled to gym with S and cues for direction.  Patient sit to stand to RW S and negotiated 12 steps with rails and close S cues for placing R foot further onto step when ascending.  Patient in parallel bars performed side stepping with UE support.  Single hand on bars for gait forward with S (L hand better than R hand).  Patient negotiated 1 step with RW and S to simulate home entry.  Gait x 200' with RW and close S to minguard A.  Negotiated over and around obstacles in hallway with RW and minguard to S with cues.  Patient slow and safe with small short turns with wide RW.  Patient ambulated to room with S and left in w/c with alarm belt and able to move in room in w/c.   Therapy Documentation Precautions:  Precautions Precautions: Fall Precaution Comments: blind right eye, prosthesis (R BKA) Restrictions Weight Bearing Restrictions: No Pain: Pain Assessment Pain Score: 0-No pain    Therapy/Group: Individual Therapy  Elray Mcgregor  Sheran Lawless, PT 04/23/2018, 12:41 PM

## 2018-04-24 ENCOUNTER — Inpatient Hospital Stay (HOSPITAL_COMMUNITY): Payer: Self-pay | Admitting: Speech Pathology

## 2018-04-24 ENCOUNTER — Inpatient Hospital Stay (HOSPITAL_COMMUNITY): Payer: Self-pay

## 2018-04-24 ENCOUNTER — Inpatient Hospital Stay (HOSPITAL_COMMUNITY): Payer: Self-pay | Admitting: Physical Therapy

## 2018-04-24 LAB — CBC WITH DIFFERENTIAL/PLATELET
Abs Immature Granulocytes: 0.1 10*3/uL — ABNORMAL HIGH (ref 0.00–0.07)
Basophils Absolute: 0 10*3/uL (ref 0.0–0.1)
Basophils Relative: 0 %
Eosinophils Absolute: 0.3 10*3/uL (ref 0.0–0.5)
Eosinophils Relative: 3 %
HCT: 32.2 % — ABNORMAL LOW (ref 39.0–52.0)
Hemoglobin: 9.7 g/dL — ABNORMAL LOW (ref 13.0–17.0)
Immature Granulocytes: 1 %
Lymphocytes Relative: 21 %
Lymphs Abs: 1.7 10*3/uL (ref 0.7–4.0)
MCH: 21.1 pg — AB (ref 26.0–34.0)
MCHC: 30.1 g/dL (ref 30.0–36.0)
MCV: 70 fL — AB (ref 80.0–100.0)
MONO ABS: 0.6 10*3/uL (ref 0.1–1.0)
Monocytes Relative: 8 %
NEUTROS ABS: 5.3 10*3/uL (ref 1.7–7.7)
Neutrophils Relative %: 67 %
Platelets: 349 10*3/uL (ref 150–400)
RBC: 4.6 MIL/uL (ref 4.22–5.81)
RDW: 15.7 % — ABNORMAL HIGH (ref 11.5–15.5)
WBC: 7.9 10*3/uL (ref 4.0–10.5)
nRBC: 0 % (ref 0.0–0.2)

## 2018-04-24 NOTE — Patient Care Conference (Signed)
Inpatient RehabilitationTeam Conference and Plan of Care Update Date: 04/23/2018   Time: 2:55 PM    Patient Name: Charles Patrick      Medical Record Number: [id number]  Date of Birth: 15-May-1924 Sex: Male         Room/Bed: 4M08C/4M08C-01 Payor Info: Payor: MEDICARE / Plan: MEDICARE PART A AND B / Product Type: *No Product type* /    Admitting Diagnosis: SDH  Admit Date/Time:  04/16/2018  4:12 PM Admission Comments: No comment available   Primary Diagnosis:  Bilateral subdural hematomas (HCC) Principal Problem: Bilateral subdural hematomas [facility])  Patient Active Problem List   Diagnosis Date Noted  . Hypokalemia   . Impulsiveness   . Anemia, chronic disease   . PAF (paroxysmal atrial fibrillation) (HCC)   . Atrial fibrillation (HCC) 04/16/2018  . Bilateral subdural hematomas (HCC) 04/16/2018  . Neuropathic pain   . Seizure prophylaxis   . Dysphagia   . S/P BKA (below knee amputation) unilateral, right (HCC)   . Benign prostatic hyperplasia   . Malnutrition of moderate degree 04/12/2018  . SDH (subdural hematoma) (HCC) 04/11/2018  . Below knee amputation status, right   . Anemia of chronic disease   . Anemia   . 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 HTN (hypertension) 10/08/2016  . HLD (hyperlipidemia) 10/08/2016  . Hypothyroidism 10/08/2016    Expected Discharge Date: Expected Discharge Date: 04/25/18  Team Members Present: Physician leading conference: Dr. 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  Improve mobility, dysphagia, seizure prophylaxis  See above   Bowel/Bladder   Continent of Bladder/Bowel  Maintain Continence to meet toileting  needs  QS toileting assessment and prn address this daily, Patient has prn orders   Swallow/Nutrition/ Hydration   Supervision  Min A  completion of family edu prior to d/c   ADL's   supervision for bathing and dressing sitting EOB with lateral leans for peri area and pulling pants over hips.  Supervision for transfers standpivot with use of the RW for support to the tub bench and the 3:1  supervision   selfcare retraining, balance retraining, transfer retraining, pt/family education   Mobility   Supervision assist with all mobility including transfers, gait, and WC mobility.   supervision assist with LRAD at ambulatory level   Safety with gait, transfers, improved balance, and d/c planning.    Communication             Safety/Cognition/ Behavioral Observations  Min assist basic functional problem solving and attention  min assist  completion of family edu prior to d/c   Pain   Denies pain   < 2  Assess qs and prn, medicate and evaluate pain as needed   Skin   Skin intact without brusing ,abrasions or skin tears, Prior craniostomy sutgery healing well with no signs of infection   No signs of infection  Assess and monitor for skin issues and notify the medical team     Rehab Goals Patient on target to meet rehab goals: Yes Rehab Goals Revised: none *See Care Plan and progress notes for long and short-term goals.     Barriers to Discharge  Current Status/Progress Possible Resolutions Date Resolved   Physician  Medical stability     See above  Therapies, await Neurosurg recs for seizure prophylaxis      Nursing                  PT                    OT                  SLP                SW                Discharge Planning/Teaching Needs:  Pt will d/c to his home with his wife and extended family to provide supervision and assist as needed.  Pt is able to direct his care.   Team Discussion:  Pt is doing well medically.  Pt's telesitter has been d/c'd and his cognition  has improved and he is clear.  Pt is setup supervision for bathing, dressing, toileting.  He's at his baseline cognitively and is on D3 diet and can advance at home to regular textures when he is ready.  On thin liquids.  Revisions to Treatment Plan:  none    Continued Need for Acute Rehabilitation Level of Care: The patient requires daily medical management by a physician with specialized training in physical medicine and rehabilitation for the following conditions: Daily direction of a multidisciplinary physical rehabilitation program to ensure safe treatment while eliciting the highest outcome that is of practical value to the patient.: Yes Daily medical management of patient stability for increased activity during participation in an intensive rehabilitation regime.: Yes Daily analysis of laboratory values and/or radiology reports with any subsequent need for medication adjustment of medical intervention for : Neurological problems   I attest that I was present, lead the team conference, and concur with the assessment and plan of the team.   Kamron Portee, [place] Deck 04/24/2018, 6:11 PM

## 2018-04-24 NOTE — Progress Notes (Signed)
Social Work Discharge Note  The overall goal for the admission was met for:   Discharge location: Yes - home with wife and adult sons  Length of Stay: Yes - 9 days  Discharge activity level: Yes - supervision  Home/community participation: Yes  Services provided included: MD, RD, PT, OT, SLP, RN, Pharmacy and SW  Financial Services: Medicare  Follow-up services arranged: Home Health: PT/OT from [facility] and DME: pt has all recommended DME  Comments (or additional information): Wife can provide supervision level of care.  Son is primary contact: Ulice Dash - son - [phone]  Patient/Family verbalized understanding of follow-up arrangements: Yes  Individual responsible for coordination of the follow-up plan: pt and his wife and sons  Confirmed correct DME delivered: Trey Sailors 04/24/2018    Prevatt, Silvestre Mesi

## 2018-04-24 NOTE — Discharge Summary (Signed)
Discharge summary job [id number]

## 2018-04-24 NOTE — Progress Notes (Signed)
[name]  Subjective/Complaints: Patient seen laying in bed this morning.  He states he slept well overnight.  He is very appreciative of his care.  He is stable to being discharged tomorrow.  ROS: Denies CP, shortness of breath, nausea, vomiting, diarrhea.  Objective: Vital Signs: Blood pressure 91/72, pulse 66, temperature 98.2 F (36.8 C), temperature source Oral, resp. rate 18, height 6\' 4"  (1.93 m), weight 74 kg, SpO2 99 %. No results found. Recent Labs    04/24/18 0644  WBC 7.9  HGB 9.7*  HCT 32.2*  PLT 349   Recent Labs    04/23/18 0522  CREATININE 1.02    Physical Exam: BP 91/72 (BP Location: Left Arm)   Pulse 66   Temp 98.2 F (36.8 C) (Oral)   Resp 18   Ht 6\' 4"  (1.93 m)   Wt 74 kg   SpO2 99%   BMI 19.86 kg/m  Constitutional: No distress . Vital signs reviewed. HENT: Normocephalic.  Atraumatic. Eyes: EOMI. No discharge. +Opaque right eye Cardiovascular: RRR.  No JVD. Respiratory: CTA bilaterally.  Normal effort. +[place] GI: BS +. Non-distended. Musc: No edema or tenderness in extremities. GI: Nondistended. Bowel sounds are normal.  Musculoskeletal: No edema or tenderness in extremities Right BKA  Neurological: He is alert and oriented, except date of month. Follows commands.  Motor: Grossly 4+-5/5 throughout, stable Skin: Skin is warm and dry. Right BKA healed Psychiatric: Normal mood.  Normal behavior.  Assessment/Plan: 1. Functional deficits secondary to bilateral subdural hematomas which require 3+ hours per day of interdisciplinary therapy in a comprehensive inpatient rehab setting.  Physiatrist is providing close team supervision and 24 hour management of active medical problems listed below.  Physiatrist and rehab team continue to assess barriers to discharge/monitor patient progress toward functional and medical goals  Care Tool:  Bathing    Body parts bathed by patient: Right arm, Left  arm, Chest, Abdomen, Right upper leg, Left upper leg, Face, Buttocks, Front perineal area   Body parts bathed by helper: Front perineal area, Buttocks Body parts n/a: Right lower leg, Left lower leg   Bathing assist Assist Level: Supervision/Verbal cueing     Upper Body Dressing/Undressing Upper body dressing   What is the patient wearing?: Pull over shirt    Upper body assist Assist Level: Set up assist    Lower Body Dressing/Undressing Lower body dressing    Lower body dressing activity did not occur: (lateral leans side to side in sitting) What is the patient wearing?: Pants     Lower body assist Assist for lower body dressing: Supervision/Verbal cueing(Lateral leans in sitting)     Toileting Toileting    Toileting assist Assist for toileting: Moderate Assistance - Patient 50 - 74%     Transfers Chair/bed transfer  Transfers assist     Chair/bed transfer assist level: Supervision/Verbal cueing     Locomotion Ambulation   Ambulation assist      Assist level: Contact Guard/Touching assist Assistive device: Walker-rolling Max distance: 180'   Walk 10 feet activity   Assist     Assist level: Contact Guard/Touching assist Assistive device: Walker-rolling   Walk 50 feet activity   Assist    Assist level: Contact Guard/Touching assist Assistive device: Walker-rolling    Walk 150 feet activity   Assist Walk 150 feet activity did not occur: Safety/medical concerns  Assist level: Contact Guard/Touching assist Assistive device: Walker-rolling    Walk 10 feet on uneven surface  activity   Assist Walk 10 feet on uneven surfaces activity did not occur: Safety/medical concerns         Wheelchair     Assist   Type of Wheelchair: Manual    Wheelchair assist level: Supervision/Verbal cueing Max wheelchair distance: 150    Wheelchair 50 feet with 2 turns activity    Assist        Assist Level: Supervision/Verbal cueing    Wheelchair 150 feet activity     Assist     Assist Level: Supervision/Verbal cueing      Medical Problem List and Plan: 1.  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  Continue CIR  Plan for d/c tomorrow  Will see patient for transitional care management in 1-2 weeks post-discharge 2.  DVT Prophylaxis/Anticoagulation: subcutaneous Lovenox initiated 04/15/2018. 3. Pain Management:  Neurontin 300 mg twice a day. 4. Mood:  Provide emotional support 5. Neuropsych: This patient is ?fully capable of making decisions on his own behalf. 6. Skin/Wound Care:  Routine skin checks 7. Fluids/Electrolytes/Nutrition:  Routine ins and outs   BMP within acceptable range on 2/25, creatinine within normal limits on 2/26 8. Seizure prophylaxis. Keppra 500 mg twice a day, continue to await surgery Recs 2/27, have not heard back in several days, DC Keppra on 2/27.    EEG negative 9. Acute onset atrial fibrillation. Amiodarone discontinued. Cardizem 30 mg every 6 hours. Cardiac rate controlled 10. Dyshagia: Follow-up speech therapy.   Advance to D3 thins  Continue to advance diet as tolerated 10. 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 Patient does have a prosthesis. 11. History of aortic valve stenosis status post transcatheter aortic valve replacement 01/22/2017. Patient currently remains on Plavix as prior to admission. Aspirin on hold due to hematoma. 12. Hypothyroidism. Synthroid 13. Hyperlipidemia. Zocor 14. BPH. Flomax. Monitor bladder function. 15.  Chronic anemia  Hemoglobin 9.7 on 2/27  Continue to monitor   LOS: 8 days A FACE TO FACE EVALUATION WAS PERFORMED  Charles Patrick 04/24/2018, 9:20 AM

## 2018-04-24 NOTE — Progress Notes (Signed)
Social Work Patient ID: Charles Patrick, male   DOB: [birth year], 85 y.o.   MRN: [id number]   CSW spoke with pt 04-23-18 about team conference discussion and d/c on 04-25-18.  He's ready for d/c and is grateful for his care and his health.  Also spoke with pt's son who will come to get pt on Friday around 4pm.  Pt wants to leave sooner, so may have his niece come get him.  HH arranged and pt has all needed DME at home already.  CSW available should needs arise.

## 2018-04-24 NOTE — Progress Notes (Signed)
Physical Therapy Discharge Summary  Patient Details  Name: Charles Patrick MRN: [id number] Date of Birth: [birth year]  Today's Date: 04/24/2018 PT Individual Time: 1310-1405 PT Individual Time Calculation (min): 55 min    Patient has met 8 of 8 long term goals due to improved activity tolerance, improved balance, improved postural control, increased strength, improved attention and improved awareness.  Patient to discharge at an ambulatory level Supervision.   Patient's care partner is independent to provide the necessary physical assistance at discharge.  Reasons goals not met: All PT goals met   Recommendation:  Patient will benefit from ongoing skilled PT services in home health setting to continue to advance safe functional mobility, address ongoing impairments in balance, transfers, gait, and minimize fall risk.  Equipment: No equipment provided  Reasons for discharge: treatment goals met and discharge from hospital  Patient/family agrees with progress made and goals achieved: Yes   PT treatment.  Pt received sitting in WC, starting lunch, and asks to begin PT in 10 minutes. PT returned and pt and agreeable to treatment. PT instructed pt in Grad day assessment to measure progress toward goals. See below for details. Pt requires Supervision assist throughout treatment for all gait and transfers. CGA to prevent LOB once when distracted with gait. Mod I WC mobility as listed below. Patient returned to room and left sitting in [facility] with call bell in reach and all needs met.       PT Discharge Pain   0/10  Vision/Perception  Vision - Assessment Eye Alignment: Within Functional Limits Perception Perception: Within Functional Limits Praxis Praxis: Intact  Cognition Overall Cognitive Status: Within Functional Limits for tasks assessed Arousal/Alertness: Awake/alert Attention: Sustained Sustained Attention: Appears intact Memory: Appears intact Awareness: Appears intact Problem  Solving: Appears intact Safety/Judgment: Appears intact Sensation Sensation Light Touch: Appears Intact Hot/Cold: Appears Intact Proprioception: Appears Intact Stereognosis: Appears Intact Coordination Gross Motor Movements are Fluid and Coordinated: Yes Fine Motor Movements are Fluid and Coordinated: Yes Finger Nose Finger Test: [name] Bil  Motor  Motor Motor: Within Functional Limits  Mobility Bed Mobility Bed Mobility: Rolling Right;Rolling Left;Sit to Supine;Supine to Sit Rolling Right: Independent Rolling Left: Independent Supine to Sit: Independent Sit to Supine: Independent Transfers Transfers: Sit to [employer] Transfers Sit to Stand: Supervision/Verbal cueing Stand Pivot Transfers: Supervision/Verbal cueing Transfer (Assistive device): Rolling walker Locomotion  Gait Ambulation: Yes Gait Assistance: Supervision/Verbal cueing Gait Distance (Feet): 150 Feet Assistive device: Rolling walker Gait Gait: Yes Gait Pattern: Impaired Gait Pattern: Right flexed knee in stance;Left flexed knee in stance;Trunk flexed Stairs / Additional Locomotion Stairs: Yes Stairs Assistance: Supervision/Verbal cueing Stair Management Technique: Two rails Number of Stairs: 4 Wheelchair Mobility Wheelchair Mobility: Yes Wheelchair Assistance: Independent with Camera operator: Both upper extremities Wheelchair Parts Management: Independent  Trunk/Postural Assessment  Cervical Assessment Cervical Assessment: Exceptions to WFL(cervical protraction present) Thoracic Assessment Thoracic Assessment: Exceptions to WFL(slight thoracic flexion) Lumbar Assessment Lumbar Assessment: Exceptions to WFL(posterior pelvic tilt in sitting) Postural Control Postural Control: Within Functional Limits  Balance Balance Balance Assessed: Yes Static Sitting Balance Static Sitting - Balance Support: Feet supported Static Sitting - Level of Assistance: 7:  Independent Dynamic Sitting Balance Dynamic Sitting - Balance Support: Feet supported;During functional activity Dynamic Sitting - Level of Assistance: 7: Independent Static Standing Balance Static Standing - Balance Support: During functional activity Static Standing - Level of Assistance: 5: Stand by assistance Dynamic Standing Balance Dynamic Standing - Balance Support: During functional activity Dynamic Standing - Level of  Assistance: 5: Stand by assistance Extremity Assessment      RLE Assessment RLE Assessment: Exceptions to [facility] Passive Range of Motion (PROM) Comments: lacking ~12 deg full extension  Active Range of Motion (AROM) Comments: lacking ~15 deg full extension  General Strength Comments: grossly 4+/5 proximal to distal BKA LLE Assessment LLE Assessment: Exceptions to [facility] Passive Range of Motion (PROM) Comments: lacking ~10 deg full ext  Active Range of Motion (AROM) Comments: lack ~ 15 deg full ext  General Strength Comments: grossly 5/5 proximal to distal except hip flexion 4+/5      Charles Patrick 04/24/2018, 2:03 PM

## 2018-04-24 NOTE — Progress Notes (Signed)
Speech Language Pathology Discharge Summary  Patient Details  Name: Charles Patrick MRN: [id number] Date of Birth: [birth year]  Today's Date: 04/24/2018 SLP Individual Time: 1305-1340 SLP Individual Time Calculation (min): 35 min   Skilled Therapeutic Interventions:  Pt was seen for skilled ST targeting dysphagia goals.  SLP facilitated the session with a trial snack of regular textures and thin liquids via straw.  No overt s/s of aspiration were evident with solids or liquids.  Recommend that diet be advanced to regular solids, thin liquids; no restrictions needed other than universal safe swallowing precautions (slow rate, small bites/sips).  Given that pt appears to be back to baseline for speech and swallowing, no further ST needs are indicated at this time.  Pt left in chair with chair alarm set and call bell within reach.       Patient has met 7 of 7 long term goals.  Patient to discharge at overall Modified Independent level.  Reasons goals not met:     Clinical Impression/Discharge Summary:   Pt has made excellent gains while inpatient and is discharging having met 7 out of 7 long term goals.  Pt is currently supervision-mod I for tasks due to and is consuming regular, thin liquids diet with mod I use of swallowing precautions.  Pt education is complete at this time.  Pt has demonstrated improved mentation and toleration of thin liquids and regular solid textures.  Pt is discharging home and no further ST needs are indicated as pt is at baseline for cognition, communication, and swallowing function.    Care Partner:  Caregiver Able to Provide Assistance: Yes  Type of Caregiver Assistance: Physical;Cognitive  Recommendation:  None      Equipment: none recommended by SLP    Reasons for discharge: Discharged from hospital   Patient/Family Agrees with Progress Made and Goals Achieved: Yes    Benicio Manna, Selinda Orion 04/24/2018, 3:21 PM

## 2018-04-24 NOTE — Progress Notes (Signed)
Occupational Therapy Session Note  Patient Details  Name: Charles Patrick MRN: [id number] Date of Birth: January 13, 1925  Today's Date: 04/24/2018 OT Individual Time: 0800-0900 and 1015-1100 OT Individual Time Calculation (min): 60 min and 45 min   Short Term Goals: Week 1:  OT Short Term Goal 1 (Week 1): STGs equal to LTGs set at supervision level overall.   Skilled Therapeutic Interventions/Progress Updates:     Session 1: 60 min. 1;1. Pt with no c/o pain and received in bed. Pt bathes and dresses with set up for obtaining items for bathing and MOD I for dressing as they were laid out on the bed from night before. Pt dons prosthetic with MOD I and transfers into w/c with squat pivot with S. Pt completes oral care standing at sink with S. Pt completes squat pivot transfer back to bed with S to finish session. Exited session with exit alarm on, pt seated EOB and call lgihtin reach  Session 2; 45 min. Pt received in bed with no pain. Pt dons prosthetic with MOD I and squat pivot with S to w/c. Pt propels w/c to/from all tx destinations. Pt requires VC for set up of w/c prior to transfer to NuStep for endruance/activity tolerance training on workload 8. Pt compeltes 8 min of Nustep per pt request to "warm up." Pt transfers back to w/c as stated above. Pt then requesting to complete UBE with instructions to complete 4 min forward and 4 min backward on level 10 resistance d/t decreased divided attention talking/exercising. Pt requires min question cues to change direction and terminate task after 9 min. Exited session with pt seated in w/c, belt alarm on and call light inreach  Therapy Documentation Precautions:  Precautions Precautions: Fall Precaution Comments: blind right eye, prosthesis (R BKA) Restrictions Weight Bearing Restrictions: No General:   Vital Signs: Therapy Vitals Temp: 98.2 F (36.8 C) Temp Source: Oral Pulse Rate: 66 Resp: 18 BP: 91/72 Oxygen Therapy SpO2: 99 % Pain:    ADL:   Therapy/Group: Individual Therapy  Shon Hale 04/24/2018, 8:12 AM

## 2018-04-24 NOTE — Discharge Summary (Addendum)
NAME: ASAD, VANDUYNE MEDICAL RECORD BP:[id number] ACCOUNT [id number] DATE OF BIRTH:August 27, 1924 FACILITY: [facility] LOCATION: [facility] PHYSICIAN:ANKIT PATEL, MD  DISCHARGE SUMMARY  DATE OF DISCHARGE:  04/25/2018  DISCHARGE DIAGNOSES: 1.  Encephalopathy secondary to bilateral subdural hematoma. 2.  Subcutaneous Lovenox for deep venous thrombosis prophylaxis initiated 04/15/2018. 3.  Pain management. 4.  Mood. 5.  Seizure prophylaxis.  6.  Acute onset atrial fibrillation,  7.  Dysphagia  8.  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   9. History of aortic valve stenosis. 10.  Hypothyroidism. 11.  Hyperlipidemia. 12.  Benign prostatic hypertrophy,  13.  Chronic anemia.  HOSPITAL COURSE:  This is a 85 year old right-handed male with history of chronic anemia, blindness right eye aortic valve stenosis with transcatheter aortic valve replacement 01/22/2017.  Maintained on aspirin and Plavix as well as complex open right  distal fibula fracture requiring external fixation with irrigation and debridement, removal of bone, tendon.  Wound care as advised with poor healing and underwent right BKA 08/20/2018 per Dr. [name].  Was discharged to a skilled center after being denied  inpatient rehabilitation services due to insurance.  The patient lives with spouse,  1 level home.  Used a walker and prosthesis.  Presented to 61 2020 after being found unresponsive in bed by family.  At the request of 911, he was lowered to the floor  by family.  Denied any trauma.  Cranial CT scan showed bilateral subdural hematoma.  Per report bilateral, primarily low density, subdural collection along the cerebral convexities measuring up to 9 mm in thickness.  No cortical mass effect or midline  shift.  No evidence for infarction.  Troponin negative.  Neurosurgery consulted.  Advised conservative care.  He did require intubation for a short time.  Follow up cranial CT scan remained stable.  EEG negative extubated to 04/14/2018.  Maintained on   Keppra for seizure prophylaxis.  Subcutaneous Lovenox for DVT prophylaxis initiated to 04/15/2018.  The patient did develop a run of atrial fibrillation,  initially on amiodarone, transitioned to Cardizem.  Hypokalemia.  Supplement added.  Dysphagia #1  nectar thick liquids.  The patient was admitted for comprehensive rehabilitation program.  PAST MEDICAL HISTORY:  See discharge diagnoses.  SOCIAL HISTORY:  The patient lives with spouse, 1 level home.  Used a walker and prosthesis prior to admission.  FUNCTIONAL STATUS:  Upon admission to rehab services was moderate assist 100 feet rolling walker, +2 physical assist sit to stand, min mod assist with ADLs.  PHYSICAL EXAMINATION: VITAL SIGNS:  Blood pressure 102/73, pulse 57, temperature 78, respirations 18. GENERAL:  Alert male in no acute distress. HEENT: Blindness, right eye. NECK:  Supple, nontender, no JVD. CARDIOVASCULAR:  Rate controlled. ABDOMEN:  Soft, nontender, good bowel sounds. LUNGS:  Clear to auscultation without wheeze. EXTREMITIES:  Right BKA well healed.  REHABILITATION HOSPITAL COURSE:  The patient was admitted to inpatient rehabilitation services.  Therapies initiated on a 3-hour daily basis, consisting of physical therapy, occupational therapy, speech therapy and rehabilitation nursing.    The following issues were addressed:    Pertaining to the patient's encephalopathy, bilateral subdural hematoma remained stable with conservative care by neurosurgery.  Subcutaneous Lovenox for DVT prophylaxis initiated to 04/15/2018.  No bleeding episodes.    Pain management with the use of Neurontin and good results.  He is participating fully with his therapies.  Keppra for seizure prophylaxis with coarse completed and discontinued.  EEG negative.  No seizure activity.    Acute onset atrial fibrillation.  Cardiac rate  controlled.  He remained on Cardizem.  No chest pain or shortness of breath.    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  Patient  did have a prosthesis.  BKA site well healed.    The patient had undergone transcatheter aortic valve replacement 01/22/2017.  He remained on Plavix therapy.  Aspirin on hold due to hematoma.    Hormone supplement for hypothyroidism.    He continued on Flomax for history of benign prostatic hypertrophy.  Hyperlipidemia with Zocor.   The patient received weekly collaborative interdisciplinary team conferences to discuss estimated length of stay, family teaching, any barriers to discharge.  Sit to stand, rolling walker.  Supervision negotiating stairs close supervision.  Ambulate up  to 200 feet rolling walker, supervision, minimal guard.  Negotiated over and around obstacles in the hallway rolling walker, supervision.  Gathered belongings for activities of daily living and homemaking.  His diet had been advanced to mechanical soft.   He was fully able to communicate his needs.  FAMILY teaching completed and planned discharge to home.  DISCHARGE MEDICATIONS:  Included Plavix 75 mg p.o. daily, Cardizem 30 mg p.o. every 6 hours, Neurontin 300 mg p.o. b.i.d.,  Synthroid 50 mcg p.o. daily, multivitamin 1 tablet daily, Zocor 5 mg p.o. daily, Flomax 0.4 mg p.o.  daily, vitamin C 500 mg p.o. daily, Tylenol as needed.  DIET:  Mechanical soft.    FOLLOWUP:  He would follow up with Dr. Maryla Morrow at the outpatient rehab service office as directed; Dr. Maeola Harman, call for appointment; Dr. Roxanne Mins, medical management.  Ambulatory referral obtained to neurology services.  AN/NUANCE D:04/24/2018 T:04/24/2018 JOB:[id number] Patient seen and examined by me on day of discharge. Maryla Morrow, MD, ABPMR

## 2018-04-25 MED ORDER — DILTIAZEM HCL 30 MG PO TABS: 30.0000 mg | ORAL_TABLET | Freq: Four times a day (QID) | ORAL | 1 refills | Status: AC

## 2018-04-25 MED ORDER — ACETAMINOPHEN 325 MG PO TABS: 650.0000 mg | ORAL_TABLET | Freq: Four times a day (QID) | ORAL | Status: AC | PRN

## 2018-04-25 MED ORDER — CLOPIDOGREL BISULFATE 75 MG PO TABS: 75.0000 mg | ORAL_TABLET | Freq: Every day | ORAL | 5 refills | Status: AC

## 2018-04-25 MED ORDER — TAMSULOSIN HCL 0.4 MG PO CAPS: 0.4000 mg | ORAL_CAPSULE | Freq: Every day | ORAL | 0 refills | Status: AC

## 2018-04-25 MED ORDER — LEVOTHYROXINE SODIUM 50 MCG PO TABS: 50.0000 ug | ORAL_TABLET | Freq: Every day | ORAL | 0 refills | Status: AC

## 2018-04-25 MED ORDER — SIMVASTATIN 5 MG PO TABS: 5.0000 mg | ORAL_TABLET | Freq: Every day | ORAL | 0 refills | Status: AC

## 2018-04-25 MED ORDER — GABAPENTIN 300 MG PO CAPS: 300.0000 mg | ORAL_CAPSULE | Freq: Two times a day (BID) | ORAL | 0 refills | Status: AC

## 2018-04-25 NOTE — Progress Notes (Signed)
Pt family present. Dan, PA in to do DC instructions. Belongings packed up and pt ready to go. Will take to ED parking d/t family parked there.   Ross Ludwig, LPN

## 2018-04-25 NOTE — Plan of Care (Signed)
  Problem: RH BOWEL ELIMINATION Goal: RH STG MANAGE BOWEL WITH ASSISTANCE Description STG Manage Bowel with Assistance. Min  Outcome: Completed/Met   Problem: RH BLADDER ELIMINATION Goal: RH STG MANAGE BLADDER WITH ASSISTANCE Description STG Manage Bladder With Assistance. Min  Outcome: Completed/Met   Problem: RH SAFETY Goal: RH STG ADHERE TO SAFETY PRECAUTIONS W/ASSISTANCE/DEVICE Description STG Adhere to Safety Precautions With Assistance/Device. Min  Outcome: Completed/Met

## 2018-04-25 NOTE — Discharge Instructions (Signed)
Inpatient Rehab Discharge Instructions  Charles Patrick Discharge date and time: No discharge date for patient encounter.   Activities/Precautions/ Functional Status: Activity: activity as tolerated Diet: mechanical soft Wound Care: none needed Functional status:  ___ No restrictions     ___ Walk up steps independently ___ 24/7 supervision/assistance   ___ Walk up steps with assistance ___ Intermittent supervision/assistance  ___ Bathe/dress independently ___ Walk with walker     _x__ Bathe/dress with assistance ___ Walk Independently    ___ Shower independently ___ Walk with assistance    ___ Shower with assistance ___ No alcohol     ___ Return to work/school ________  COMMUNITY REFERRALS UPON DISCHARGE:   Home Health:   PT     OT  Agency:  [facility] Phone:  [phone] Medical Equipment/Items Ordered:  You have all recommded equipment at home already.  Special Instructions: Continue to hold aspirin secondary to subdural hematoma until follow-up with neurosurgery   My questions have been answered and I understand these instructions. I will adhere to these goals and the provided educational materials after my discharge from the hospital.  Patient/Caregiver Signature _______________________________ Date __________  Clinician Signature _______________________________________ Date __________  Please bring this form and your medication list with you to all your follow-up doctor's appointments.

## 2018-04-29 ENCOUNTER — Telehealth: Payer: Self-pay | Admitting: *Deleted

## 2018-04-29 NOTE — Telephone Encounter (Signed)
Transitional Care Call, 1st attempt  Contacted Dalonta Biernacki, patients son, primary contact (per social work note), [phone].  No answer, went to voicemail, left message to contact us

## 2018-04-30 ENCOUNTER — Other Ambulatory Visit (HOSPITAL_COMMUNITY): Payer: Self-pay | Admitting: Neurosurgery

## 2018-04-30 DIAGNOSIS — S065X9A Traumatic subdural hemorrhage with loss of consciousness of unspecified duration, initial encounter: Secondary | ICD-10-CM

## 2018-04-30 DIAGNOSIS — S065XAA Traumatic subdural hemorrhage with loss of consciousness status unknown, initial encounter: Secondary | ICD-10-CM

## 2018-04-30 NOTE — Telephone Encounter (Signed)
Switched to hospital follow up 

## 2018-05-05 ENCOUNTER — Ambulatory Visit (HOSPITAL_BASED_OUTPATIENT_CLINIC_OR_DEPARTMENT_OTHER): Payer: Medicare Other

## 2018-05-05 ENCOUNTER — Encounter (HOSPITAL_BASED_OUTPATIENT_CLINIC_OR_DEPARTMENT_OTHER): Payer: Self-pay

## 2018-05-07 ENCOUNTER — Encounter: Payer: Medicare Other | Admitting: Physical Medicine & Rehabilitation

## 2018-05-26 ENCOUNTER — Ambulatory Visit (HOSPITAL_BASED_OUTPATIENT_CLINIC_OR_DEPARTMENT_OTHER): Payer: Medicare Other

## 2018-06-10 ENCOUNTER — Ambulatory Visit (HOSPITAL_BASED_OUTPATIENT_CLINIC_OR_DEPARTMENT_OTHER): Payer: Medicare Other

## 2018-08-13 ENCOUNTER — Ambulatory Visit (HOSPITAL_BASED_OUTPATIENT_CLINIC_OR_DEPARTMENT_OTHER): Payer: Medicare Other

## 2018-08-26 ENCOUNTER — Other Ambulatory Visit: Payer: Self-pay

## 2018-08-26 ENCOUNTER — Ambulatory Visit (HOSPITAL_BASED_OUTPATIENT_CLINIC_OR_DEPARTMENT_OTHER)
Admission: RE | Admit: 2018-08-26 | Discharge: 2018-08-26 | Disposition: A | Discharge status: Home / Self Care | Payer: Medicare Other | Source: Ambulatory Visit | Attending: Neurosurgery | Admitting: Neurosurgery

## 2018-08-26 DIAGNOSIS — S065X9A Traumatic subdural hemorrhage with loss of consciousness of unspecified duration, initial encounter: Secondary | ICD-10-CM | POA: Insufficient documentation

## 2018-08-26 DIAGNOSIS — S065XAA Traumatic subdural hemorrhage with loss of consciousness status unknown, initial encounter: Secondary | ICD-10-CM

## 2018-11-08 ENCOUNTER — Other Ambulatory Visit: Payer: Self-pay

## 2018-11-08 ENCOUNTER — Emergency Department (HOSPITAL_COMMUNITY)
Admission: EM | Admit: 2018-11-08 | Discharge: 2018-11-09 | Disposition: A | Discharge status: Home / Self Care | Payer: Medicare Other | Attending: Emergency Medicine | Admitting: Emergency Medicine

## 2018-11-08 DIAGNOSIS — I10 Essential (primary) hypertension: Secondary | ICD-10-CM | POA: Diagnosis not present

## 2018-11-08 DIAGNOSIS — E039 Hypothyroidism, unspecified: Secondary | ICD-10-CM | POA: Diagnosis not present

## 2018-11-08 DIAGNOSIS — Z79899 Other long term (current) drug therapy: Secondary | ICD-10-CM | POA: Diagnosis not present

## 2018-11-08 DIAGNOSIS — Z89511 Acquired absence of right leg below knee: Secondary | ICD-10-CM | POA: Insufficient documentation

## 2018-11-08 DIAGNOSIS — R569 Unspecified convulsions: Secondary | ICD-10-CM | POA: Diagnosis not present

## 2018-11-08 DIAGNOSIS — Z7902 Long term (current) use of antithrombotics/antiplatelets: Secondary | ICD-10-CM | POA: Diagnosis not present

## 2018-11-08 DIAGNOSIS — H5440 Blindness, one eye, unspecified eye: Secondary | ICD-10-CM | POA: Diagnosis not present

## 2018-11-08 LAB — CBC
HCT: 35.7 % — ABNORMAL LOW (ref 39.0–52.0)
Hemoglobin: 11.1 g/dL — ABNORMAL LOW (ref 13.0–17.0)
MCH: 21.8 pg — ABNORMAL LOW (ref 26.0–34.0)
MCHC: 31.1 g/dL (ref 30.0–36.0)
MCV: 70.1 fL — ABNORMAL LOW (ref 80.0–100.0)
Platelets: 204 10*3/uL (ref 150–400)
RBC: 5.09 MIL/uL (ref 4.22–5.81)
RDW: 16 % — ABNORMAL HIGH (ref 11.5–15.5)
WBC: 9.5 10*3/uL (ref 4.0–10.5)
nRBC: 0 % (ref 0.0–0.2)

## 2018-11-08 NOTE — ED Provider Notes (Signed)
[facility] EMERGENCY DEPARTMENT Provider Note   CSN: [id number] Arrival date & time: 11/08/18  2303     History   Chief Complaint Chief Complaint  Patient presents with  . Seizures    HPI Charles Patrick is a 85 y.o. male.     Patient to ED with history provided by EMS from family who is not present. They were called to the home for seizure activity. Per their report, he had seizure around 9:00 pm tonight. Patient has no memory. He had questionable seizure earlier in the day but family did not call EMS because "they weren't sure".   The history is provided by the patient and the EMS personnel. No language interpreter was used.  Seizures   Past Medical History:  Diagnosis Date  . Anemia   . Aortic valve stenosis   . Arthropathy   . BPH associated with nocturia   . Carotid artery disease (HCC)   . Colon polyp   . Dilated aortic root (HCC)   . Elevated PSA   . Erectile dysfunction   . Gastric paresis   . Hypercholesterolemia   . Hyperglycemia   . Hypertension    Patient denies  . Hypothyroid   . Iron deficiency   . Pulmonary hypertension (HCC)   . 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 Seizure disorder (HCC)   . Thrombocytopenia (HCC)   . Thyroid nodule   . Tricuspid regurgitation   . Vitamin D deficiency     Patient Active Problem List   Diagnosis Date Noted  . Hypokalemia   . Impulsiveness   . Anemia, chronic disease   . PAF (paroxysmal atrial fibrillation) (HCC)   . Atrial fibrillation (HCC) 04/16/2018  . Bilateral subdural hematomas (HCC) 04/16/2018  . Neuropathic pain   . Seizure prophylaxis   . Dysphagia   . S/P BKA (below knee amputation) unilateral, right (HCC)   . Benign prostatic hyperplasia   . Malnutrition of moderate degree 04/12/2018  . SDH (subdural hematoma) (HCC) 04/11/2018  . Below knee  amputation status, right   . Anemia of chronic disease   . Anemia   . 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 HTN (hypertension) 10/08/2016  . HLD (hyperlipidemia) 10/08/2016  . Hypothyroidism 10/08/2016    Past Surgical History:  Procedure Laterality Date  . AMPUTATION Right 08/19/2017   Procedure: AMPUTATION BELOW KNEE;  Surgeon: Myrene Galas, MD;  Location: [facility] OR;  Service: Orthopedics;  Laterality: Right;  . EXTERNAL FIXATION LEG Right 08/14/2017   Procedure: EXTERNAL FIXATION LEG;  Surgeon: Toni Arthurs, MD;  Location: [facility] OR;  Service: Orthopedics;  Laterality: Right;  . I&D EXTREMITY Right 08/16/2017   Procedure: IRRIGATION AND DEBRIDEMENT EXTREMITY;  Surgeon: Myrene Galas, MD;  Location: [facility] OR;  Service: Orthopedics;  Laterality: Right;  . INCISION AND DRAINAGE Right 08/14/2017   Procedure: IRRIGATION AND DEBRIDMENT RIGHT ANKLE, APPLICATION WOUND VAC;  Surgeon: Toni Arthurs, MD;  Location: [facility] OR;  Service: Orthopedics;  Laterality: Right;  . MULTIPLE EXTRACTIONS WITH ALVEOLOPLASTY N/A 10/17/2016   Procedure: Extraction of tooth #'s 4, 21,22,27,28,29 with alveoloplasty;  Surgeon: Charlynne Pander, DDS;  Location: [facility] OR;  Service: Oral Surgery;  Laterality: N/A;  . NO PAST SURGERIES    . RIGHT/LEFT HEART CATH AND CORONARY ANGIOGRAPHY N/A 01/11/2017   Procedure: RIGHT/LEFT HEART CATH AND CORONARY ANGIOGRAPHY;  Surgeon: Tonny Bollman,  MD;  Location: [place] LAB;  Service: Cardiovascular;  Laterality: N/A;  . TEE WITHOUT CARDIOVERSION N/A 01/22/2017   Procedure: TRANSESOPHAGEAL ECHOCARDIOGRAM (TEE);  Surgeon: Sherren Mocha, MD;  Location: [facility];  Service: Open Heart Surgery;  Laterality: N/A;  . TRANSCATHETER AORTIC VALVE REPLACEMENT, TRANSFEMORAL N/A 01/22/2017   Procedure: TRANSCATHETER AORTIC VALVE REPLACEMENT, TRANSFEMORAL using Edwards 29 Sapien 3 Aortic Valve;  Surgeon: Sherren Mocha, MD;  Location: [name];  Service: Open Heart Surgery;   Laterality: N/A;        Home Medications    Prior to Admission medications   Medication Sig Start Date End Date Taking? Authorizing Provider  acetaminophen (TYLENOL) 325 MG tablet Take 2 tablets (650 mg total) by mouth every 6 (six) hours as needed for fever. 04/25/18   Angiulli, Lavon Paganini, PA-C  clopidogrel (PLAVIX) 75 MG tablet Take 1 tablet (75 mg total) by mouth daily with breakfast. 04/25/18   Angiulli, Lavon Paganini, PA-C  diltiazem (CARDIZEM) 30 MG tablet Take 1 tablet (30 mg total) by mouth every 6 (six) hours. 04/25/18   Angiulli, Lavon Paganini, PA-C  docusate sodium (COLACE) 100 MG capsule Take 1 capsule (100 mg total) by mouth 2 (two) times daily. Patient not taking: Reported on 04/11/2018 08/23/17   Ainsley Spinner, PA-C  gabapentin (NEURONTIN) 300 MG capsule Take 1 capsule (300 mg total) by mouth 2 (two) times daily. 04/25/18   Angiulli, Lavon Paganini, PA-C  levothyroxine (SYNTHROID, LEVOTHROID) 50 MCG tablet Take 1 tablet (50 mcg total) by mouth daily before breakfast. 04/25/18   Angiulli, Lavon Paganini, PA-C  Multiple Vitamin (MULTIVITAMIN) capsule Take 1 capsule by mouth daily.    [provider]  simvastatin (ZOCOR) 5 MG tablet Take 1 tablet (5 mg total) by mouth daily at 6 PM. 04/25/18   Angiulli, Lavon Paganini, PA-C  tamsulosin (FLOMAX) 0.4 MG CAPS capsule Take 1 capsule (0.4 mg total) by mouth daily after supper. 04/25/18   Angiulli, Lavon Paganini, PA-C  vitamin C (VITAMIN C) 500 MG tablet Take 1 tablet (500 mg total) by mouth daily. Patient not taking: Reported on 04/11/2018 08/23/17   Ainsley Spinner, PA-C    Family History Family History  Problem Relation Age of Onset  . Healthy Mother   . Healthy Father     Social History Social History   Tobacco Use  . Smoking status: Never Smoker  . Smokeless tobacco: Never Used  Substance Use Topics  . Alcohol use: No  . Drug use: No     Allergies   Patient has no known allergies.   Review of Systems Review of Systems  Unable to perform ROS: Other  (patient has no memory of event - has no complaints)  Neurological: Positive for seizures.     Physical Exam Updated Vital Signs BP 129/87 (BP Location: Right Arm)   Pulse 74   Temp 98 F (36.7 C) (Oral)   Resp 19   Ht 6\' 2"  (1.88 m)   Wt 81.6 kg   SpO2 99%   BMI 23.11 kg/m   Physical Exam Vitals signs and nursing note reviewed.  Constitutional:      Appearance: Normal appearance.  HENT:     Head: Normocephalic and atraumatic.  Eyes:     Comments: Right eye opaque, h/o blindness. Left pupil reactive.  Neck:     Musculoskeletal: Normal range of motion.  Cardiovascular:     Rate and Rhythm: Normal rate and regular rhythm.     Heart sounds: No murmur.  Pulmonary:     Effort: Pulmonary effort is normal.     Breath sounds: No wheezing, rhonchi or rales.  Abdominal:     General: There is no distension.     Palpations: Abdomen is soft.     Tenderness: There is no abdominal tenderness.  Musculoskeletal: Normal range of motion.     Comments: Right BKA. Left LE without edema.  Skin:    General: Skin is warm and dry.  Neurological:     General: No focal deficit present.     Mental Status: He is alert and oriented to person, place, and time.     Sensory: No sensory deficit.     Motor: No weakness.     Coordination: Coordination normal.      ED Treatments / Results  Labs (all labs ordered are listed, but only abnormal results are displayed) Labs Reviewed  BASIC METABOLIC PANEL  CBC    EKG None  Radiology No results found.  Procedures Procedures (including critical care time)  Medications Ordered in ED Medications - No data to display   Initial Impression / Assessment and Plan / ED Course  I have reviewed the triage vital signs and the nursing notes.  Pertinent labs & imaging results that were available during my care of the patient were reviewed by me and considered in my medical decision making (see chart for details).        Patient to ED with  multiple seizure activity in the last 24 hours. Patient unable to contribute significantly to history. Denies any symptoms currently, specifically, no pain, congestion, cough, nausea, vomiting or injury.   Further history obtained from son, Link Snufferddie, by phone. He believes his father had a seizure when hospitalized with SDH in February. Confirms on no medications for same. He is aware of 2 seizures his father had in the last 24 hours. No reported injury from falls secondary to seizure. No fever, vomiting.   The patient remains comfortable throughout ED encounter. He remains without complaint. Labs are reassuring, CT Head negative for recurrent SDH. Chronic subdural noted.   Discussed with neurology, Dr. Otelia LimesLindzen. Will check a magnesium level. Feels the patient should be started on Keppra - 1500 mg loading dose provided in ED. Gave the patient the option for discharge home and close neurologic follow up in the outpatient setting or being admitted. The patient is very comfortable with discharge home. Will give Rx for Keppra 500 mg BID for home and [facility]   Mag level normal. VSS. He is appropriate for discharge home.   Final Clinical Impressions(s) / ED Diagnoses   Final diagnoses:  None   1. Seizures  ED Discharge Orders    None       Elpidio AnisUpstill, Raiya Stainback, PA-C 11/09/18 0444    Mesner, Barbara CowerJason, MD 11/09/18 [phone]

## 2018-11-08 NOTE — ED Triage Notes (Signed)
Pt BIB [facility] from home, pt's family called for pt shaking all over. No hx of seizures. Pt reports two possible seizures. Hx of stroke.

## 2018-11-09 ENCOUNTER — Emergency Department (HOSPITAL_COMMUNITY): Payer: Medicare Other

## 2018-11-09 LAB — BASIC METABOLIC PANEL
Anion gap: 6 (ref 5–15)
BUN: 9 mg/dL (ref 8–23)
CO2: 28 mmol/L (ref 22–32)
Calcium: 8.7 mg/dL — ABNORMAL LOW (ref 8.9–10.3)
Chloride: 101 mmol/L (ref 98–111)
Creatinine, Ser: 1.01 mg/dL (ref 0.61–1.24)
GFR calc Af Amer: >60 mL/min (ref >60)
GFR calc non Af Amer: >60 mL/min (ref >60)
Glucose, Bld: 122 mg/dL — ABNORMAL HIGH (ref 70–99)
Potassium: 3.9 mmol/L (ref 3.5–5.1)
Sodium: 135 mmol/L (ref 135–145)

## 2018-11-09 LAB — MAGNESIUM: Magnesium: 2.2 mg/dL (ref 1.7–2.4)

## 2018-11-09 MED ORDER — LEVETIRACETAM 500 MG PO TABS: 500.0000 mg | ORAL_TABLET | Freq: Two times a day (BID) | ORAL | 0 refills | Status: AC

## 2018-11-09 MED ORDER — LEVETIRACETAM IN NACL 1500 MG/100ML IV SOLN
1500.0000 mg | Freq: Once | INTRAVENOUS | Status: AC
  Administered 2018-11-09: 04:00:00 1500 mg via INTRAVENOUS
  Filled 2018-11-09: qty 100

## 2018-11-09 NOTE — Discharge Instructions (Addendum)
Please take Keppra twice daily to prevent further seizures. You can start the prescription with the afternoon/evening dose today.   [facility] will contact you with an appointment time for further outpatient evaluation and management of seizures. If you have not heard from them in 24-48 hours, use the number provided to call the office.   Return to the emergency department as needed for worsening symptoms or new concern.

## 2018-11-09 NOTE — ED Notes (Signed)
Patient transported to CT 

## 2018-11-09 NOTE — ED Provider Notes (Signed)
Medical screening examination/treatment/procedure(s) were conducted as a shared visit with non-physician practitioner(s) and myself.  I personally evaluated the patient during the encounter.    Patient here wit seizure like activity. Not on anti epileptics.   On my exam he appears well. Normal vital signs. No complaints of pain, headaches, etc.   Has right eye blindness, right BKA all chronic. Neuro exam otherwise unremarkable. Plan for discussion with neurology for possible observation/EEG for same and to reinstitute anti-epileptics.     Shilynn Hoch, Corene Cornea, MD 11/09/18 [phone]

## 2018-12-02 ENCOUNTER — Encounter: Payer: Self-pay | Admitting: Neurology

## 2018-12-02 ENCOUNTER — Ambulatory Visit: Payer: Medicare Other | Admitting: Neurology

## 2019-07-02 IMAGING — DX DG ABD PORTABLE 1V
2 series · 2 of 2 positions shown · non-contrast
Comparison: CT abdomen and pelvis 10/25/2016.

CLINICAL DATA: Bedside OG tube placement.

EXAM:
PORTABLE ABDOMEN - 1 VIEW

[abdomen kub (1 of 2)]
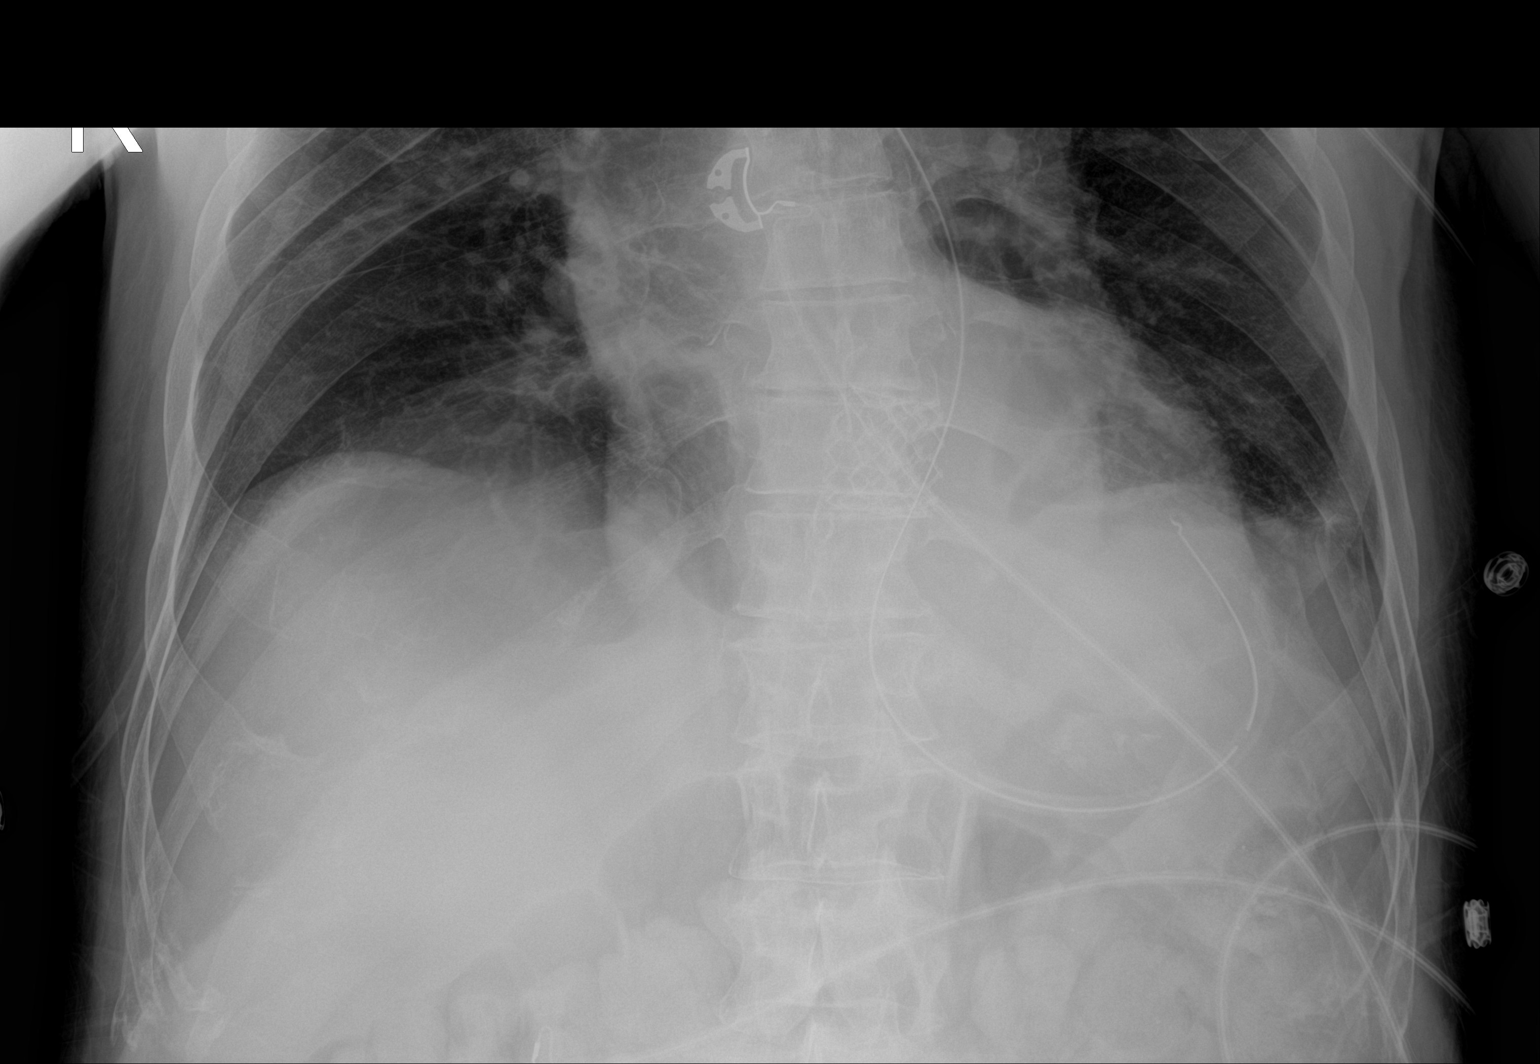

[abdomen kub (2 of 2)]
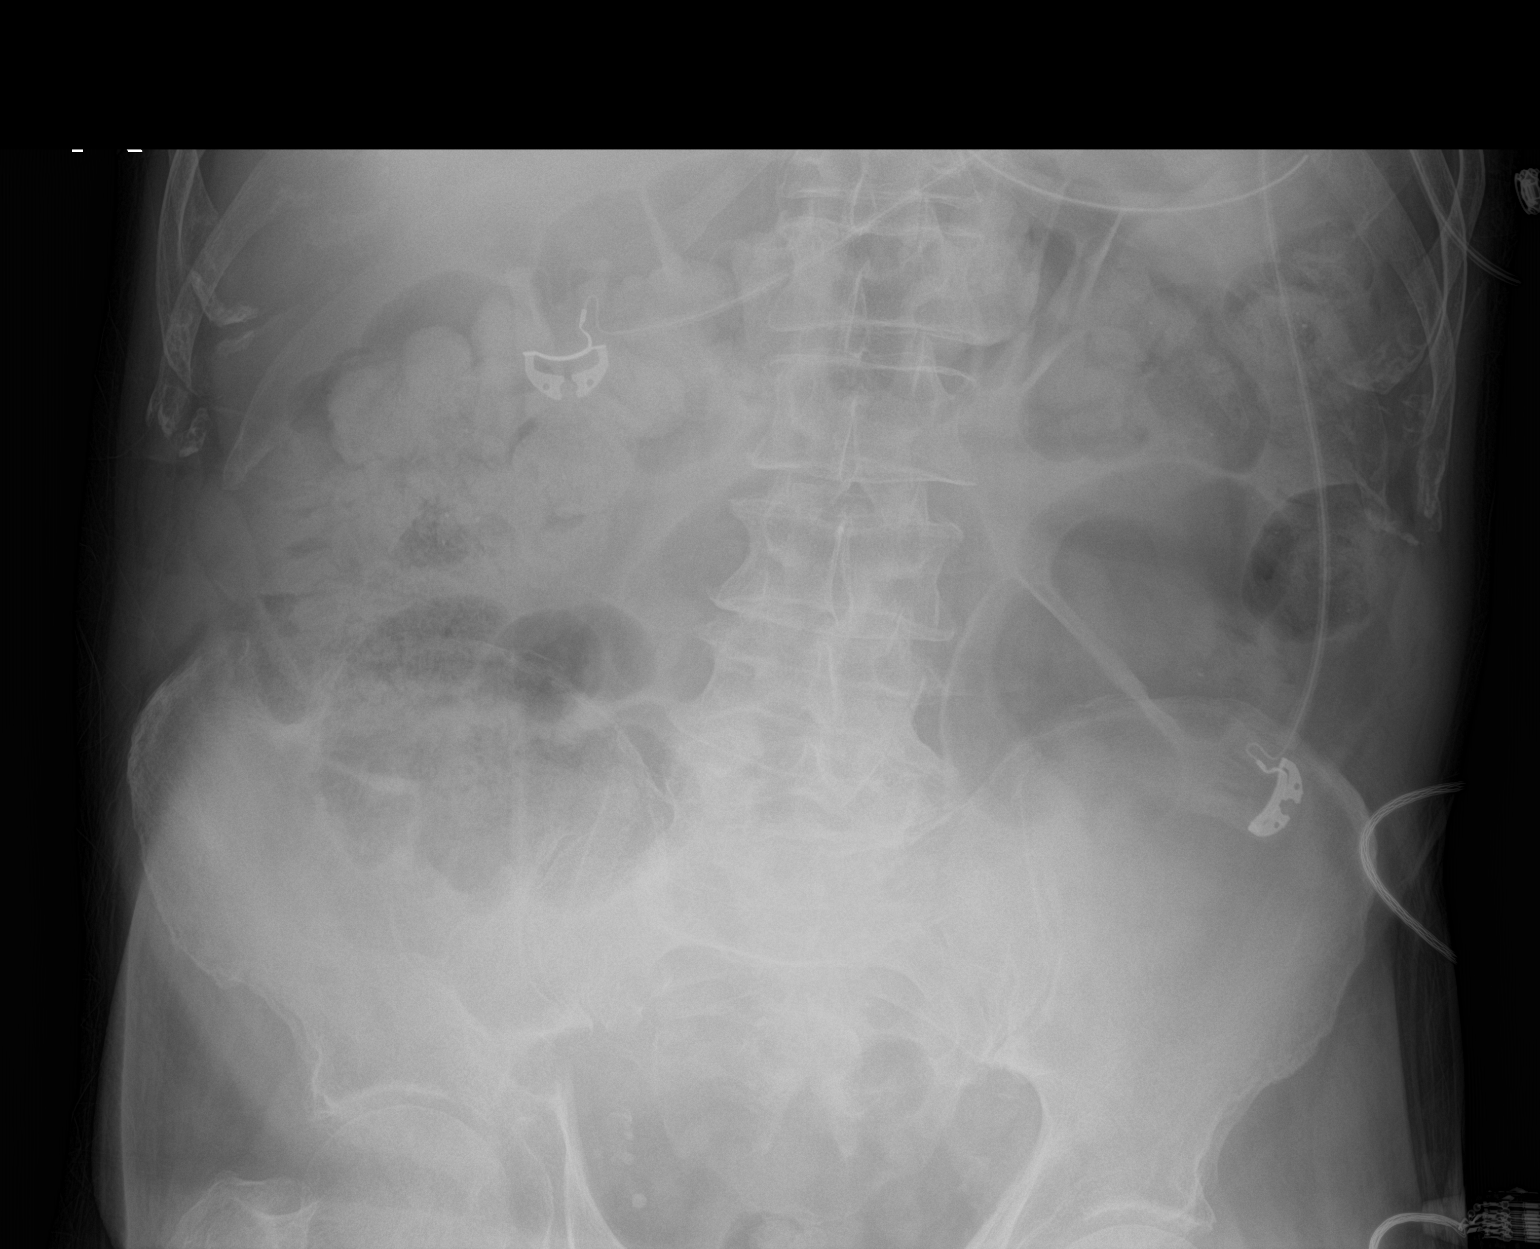

[2 of 2 positions shown; findings below may reference images not displayed]

FINDINGS: OG tube looped in the stomach with its tip in the fundus. Bowel gas
pattern unremarkable without evidence of obstruction or significant
ileus. Moderate colonic stool burden.

Linear atelectasis at the LEFT lung base as noted on the chest x-ray
earlier same day. Prior TAVR.
IMPRESSION: 1. OG tube looped in the stomach with its tip in the fundus.
2. No acute abdominal abnormality.  Moderate colonic stool burden.
